# Patient Record
Sex: Female | Born: 1940 | Race: White | Hispanic: No | Marital: Married | Smoking: Never smoker
Health system: Southern US, Community
[De-identification: ages and names within clinical notes are randomized; demographics above are authoritative.]

## PROBLEM LIST (undated history)

## (undated) DIAGNOSIS — K8689 Other specified diseases of pancreas: Secondary | ICD-10-CM

## (undated) DIAGNOSIS — E119 Type 2 diabetes mellitus without complications: Secondary | ICD-10-CM

## (undated) DIAGNOSIS — J302 Other seasonal allergic rhinitis: Secondary | ICD-10-CM

## (undated) DIAGNOSIS — K859 Acute pancreatitis without necrosis or infection, unspecified: Secondary | ICD-10-CM

## (undated) DIAGNOSIS — I499 Cardiac arrhythmia, unspecified: Secondary | ICD-10-CM

## (undated) DIAGNOSIS — E7800 Pure hypercholesterolemia, unspecified: Secondary | ICD-10-CM

## (undated) DIAGNOSIS — K219 Gastro-esophageal reflux disease without esophagitis: Secondary | ICD-10-CM

## (undated) DIAGNOSIS — I10 Essential (primary) hypertension: Secondary | ICD-10-CM

## (undated) DIAGNOSIS — C259 Malignant neoplasm of pancreas, unspecified: Secondary | ICD-10-CM

## (undated) HISTORY — PX: ABDOMINAL HYSTERECTOMY: SHX81

## (undated) HISTORY — PX: CHOLECYSTECTOMY: SHX55

## (undated) HISTORY — PX: NASAL SINUS SURGERY: SHX719

---

## 2012-06-29 DIAGNOSIS — I4891 Unspecified atrial fibrillation: Secondary | ICD-10-CM | POA: Insufficient documentation

## 2019-11-17 ENCOUNTER — Encounter: Payer: Self-pay | Admitting: Emergency Medicine

## 2019-11-17 ENCOUNTER — Other Ambulatory Visit: Payer: Self-pay

## 2019-11-17 ENCOUNTER — Encounter (HOSPITAL_COMMUNITY): Payer: Self-pay | Admitting: Emergency Medicine

## 2019-11-17 ENCOUNTER — Other Ambulatory Visit: Payer: Self-pay | Admitting: Physician Assistant

## 2019-11-17 ENCOUNTER — Inpatient Hospital Stay (HOSPITAL_COMMUNITY)
Admission: EM | Admit: 2019-11-17 | Discharge: 2019-11-28 | DRG: 438 | Disposition: A | Discharge status: Home / Self Care | Payer: Medicare Other | Source: Ambulatory Visit | Attending: Internal Medicine | Admitting: Internal Medicine

## 2019-11-17 ENCOUNTER — Emergency Department (HOSPITAL_COMMUNITY): Payer: Medicare Other

## 2019-11-17 ENCOUNTER — Ambulatory Visit
Admission: EM | Admit: 2019-11-17 | Discharge: 2019-11-17 | Disposition: A | Discharge status: Other Acute Inpatient Hospital | Payer: Medicare Other

## 2019-11-17 DIAGNOSIS — K644 Residual hemorrhoidal skin tags: Secondary | ICD-10-CM | POA: Diagnosis present

## 2019-11-17 DIAGNOSIS — E871 Hypo-osmolality and hyponatremia: Secondary | ICD-10-CM | POA: Diagnosis present

## 2019-11-17 DIAGNOSIS — E8809 Other disorders of plasma-protein metabolism, not elsewhere classified: Secondary | ICD-10-CM | POA: Diagnosis present

## 2019-11-17 DIAGNOSIS — R110 Nausea: Secondary | ICD-10-CM | POA: Diagnosis not present

## 2019-11-17 DIAGNOSIS — K8689 Other specified diseases of pancreas: Secondary | ICD-10-CM | POA: Diagnosis present

## 2019-11-17 DIAGNOSIS — K5900 Constipation, unspecified: Secondary | ICD-10-CM | POA: Diagnosis present

## 2019-11-17 DIAGNOSIS — Z20822 Contact with and (suspected) exposure to covid-19: Secondary | ICD-10-CM | POA: Diagnosis present

## 2019-11-17 DIAGNOSIS — K859 Acute pancreatitis without necrosis or infection, unspecified: Secondary | ICD-10-CM | POA: Diagnosis present

## 2019-11-17 DIAGNOSIS — D649 Anemia, unspecified: Secondary | ICD-10-CM | POA: Diagnosis present

## 2019-11-17 DIAGNOSIS — E785 Hyperlipidemia, unspecified: Secondary | ICD-10-CM | POA: Diagnosis present

## 2019-11-17 DIAGNOSIS — K2950 Unspecified chronic gastritis without bleeding: Secondary | ICD-10-CM | POA: Diagnosis present

## 2019-11-17 DIAGNOSIS — K922 Gastrointestinal hemorrhage, unspecified: Secondary | ICD-10-CM | POA: Diagnosis not present

## 2019-11-17 DIAGNOSIS — Z79899 Other long term (current) drug therapy: Secondary | ICD-10-CM

## 2019-11-17 DIAGNOSIS — K5520 Angiodysplasia of colon without hemorrhage: Secondary | ICD-10-CM | POA: Diagnosis present

## 2019-11-17 DIAGNOSIS — K862 Cyst of pancreas: Secondary | ICD-10-CM | POA: Diagnosis not present

## 2019-11-17 DIAGNOSIS — R1084 Generalized abdominal pain: Secondary | ICD-10-CM

## 2019-11-17 DIAGNOSIS — Z881 Allergy status to other antibiotic agents status: Secondary | ICD-10-CM | POA: Diagnosis not present

## 2019-11-17 DIAGNOSIS — Z9049 Acquired absence of other specified parts of digestive tract: Secondary | ICD-10-CM | POA: Diagnosis not present

## 2019-11-17 DIAGNOSIS — R112 Nausea with vomiting, unspecified: Secondary | ICD-10-CM

## 2019-11-17 DIAGNOSIS — R81 Glycosuria: Secondary | ICD-10-CM

## 2019-11-17 DIAGNOSIS — D126 Benign neoplasm of colon, unspecified: Secondary | ICD-10-CM | POA: Diagnosis present

## 2019-11-17 DIAGNOSIS — R1010 Upper abdominal pain, unspecified: Secondary | ICD-10-CM | POA: Diagnosis not present

## 2019-11-17 DIAGNOSIS — E1165 Type 2 diabetes mellitus with hyperglycemia: Secondary | ICD-10-CM

## 2019-11-17 DIAGNOSIS — M545 Low back pain, unspecified: Secondary | ICD-10-CM

## 2019-11-17 DIAGNOSIS — K635 Polyp of colon: Secondary | ICD-10-CM | POA: Diagnosis not present

## 2019-11-17 DIAGNOSIS — Z1211 Encounter for screening for malignant neoplasm of colon: Secondary | ICD-10-CM | POA: Diagnosis not present

## 2019-11-17 DIAGNOSIS — K219 Gastro-esophageal reflux disease without esophagitis: Secondary | ICD-10-CM | POA: Diagnosis present

## 2019-11-17 DIAGNOSIS — R933 Abnormal findings on diagnostic imaging of other parts of digestive tract: Secondary | ICD-10-CM | POA: Diagnosis not present

## 2019-11-17 DIAGNOSIS — K863 Pseudocyst of pancreas: Secondary | ICD-10-CM | POA: Diagnosis present

## 2019-11-17 DIAGNOSIS — I10 Essential (primary) hypertension: Secondary | ICD-10-CM | POA: Diagnosis present

## 2019-11-17 DIAGNOSIS — E1010 Type 1 diabetes mellitus with ketoacidosis without coma: Secondary | ICD-10-CM

## 2019-11-17 DIAGNOSIS — E1110 Type 2 diabetes mellitus with ketoacidosis without coma: Secondary | ICD-10-CM | POA: Diagnosis present

## 2019-11-17 DIAGNOSIS — K317 Polyp of stomach and duodenum: Secondary | ICD-10-CM | POA: Diagnosis present

## 2019-11-17 DIAGNOSIS — K641 Second degree hemorrhoids: Secondary | ICD-10-CM | POA: Diagnosis present

## 2019-11-17 DIAGNOSIS — R824 Acetonuria: Secondary | ICD-10-CM

## 2019-11-17 DIAGNOSIS — K8500 Idiopathic acute pancreatitis without necrosis or infection: Secondary | ICD-10-CM | POA: Diagnosis not present

## 2019-11-17 DIAGNOSIS — K5730 Diverticulosis of large intestine without perforation or abscess without bleeding: Secondary | ICD-10-CM | POA: Diagnosis present

## 2019-11-17 DIAGNOSIS — E0810 Diabetes mellitus due to underlying condition with ketoacidosis without coma: Secondary | ICD-10-CM | POA: Diagnosis not present

## 2019-11-17 DIAGNOSIS — E876 Hypokalemia: Secondary | ICD-10-CM | POA: Diagnosis present

## 2019-11-17 DIAGNOSIS — Z880 Allergy status to penicillin: Secondary | ICD-10-CM

## 2019-11-17 DIAGNOSIS — Z90710 Acquired absence of both cervix and uterus: Secondary | ICD-10-CM | POA: Diagnosis not present

## 2019-11-17 HISTORY — DX: Pure hypercholesterolemia, unspecified: E78.00

## 2019-11-17 HISTORY — DX: Essential (primary) hypertension: I10

## 2019-11-17 HISTORY — DX: Type 2 diabetes mellitus without complications: E11.9

## 2019-11-17 HISTORY — DX: Gastro-esophageal reflux disease without esophagitis: K21.9

## 2019-11-17 LAB — URINALYSIS, ROUTINE W REFLEX MICROSCOPIC
Bacteria, UA: NONE SEEN
Bilirubin Urine: NEGATIVE
Glucose, UA: >=500 mg/dL — AB
Hgb urine dipstick: NEGATIVE
Ketones, ur: 80 mg/dL — AB
Leukocytes,Ua: NEGATIVE
Nitrite: NEGATIVE
Protein, ur: 30 mg/dL — AB
Specific Gravity, Urine: 1.024 (ref 1.005–1.030)
pH: 5 (ref 5.0–8.0)

## 2019-11-17 LAB — CBC
HCT: 39.1 % (ref 36.0–46.0)
Hemoglobin: 12.3 g/dL (ref 12.0–15.0)
MCH: 28.5 pg (ref 26.0–34.0)
MCHC: 31.5 g/dL (ref 30.0–36.0)
MCV: 90.5 fL (ref 80.0–100.0)
Platelets: 287 10*3/uL (ref 150–400)
RBC: 4.32 MIL/uL (ref 3.87–5.11)
RDW: 13.5 % (ref 11.5–15.5)
WBC: 15.2 10*3/uL — ABNORMAL HIGH (ref 4.0–10.5)
nRBC: 0 % (ref 0.0–0.2)

## 2019-11-17 LAB — COMPREHENSIVE METABOLIC PANEL
ALT: 16 U/L (ref 0–44)
AST: 9 U/L — ABNORMAL LOW (ref 15–41)
Albumin: 2.9 g/dL — ABNORMAL LOW (ref 3.5–5.0)
Alkaline Phosphatase: 111 U/L (ref 38–126)
Anion gap: 17 — ABNORMAL HIGH (ref 5–15)
BUN: 14 mg/dL (ref 8–23)
CO2: 11 mmol/L — ABNORMAL LOW (ref 22–32)
Calcium: 8.9 mg/dL (ref 8.9–10.3)
Chloride: 106 mmol/L (ref 98–111)
Creatinine, Ser: 0.75 mg/dL (ref 0.44–1.00)
GFR, Estimated: >60 mL/min (ref >60)
Glucose, Bld: 291 mg/dL — ABNORMAL HIGH (ref 70–99)
Potassium: 3.7 mmol/L (ref 3.5–5.1)
Sodium: 134 mmol/L — ABNORMAL LOW (ref 135–145)
Total Bilirubin: 0.6 mg/dL (ref 0.3–1.2)
Total Protein: 7 g/dL (ref 6.5–8.1)

## 2019-11-17 LAB — BASIC METABOLIC PANEL
Anion gap: 15 (ref 5–15)
Anion gap: 12 (ref 5–15)
BUN: 11 mg/dL (ref 8–23)
BUN: 10 mg/dL (ref 8–23)
CO2: 11 mmol/L — ABNORMAL LOW (ref 22–32)
CO2: 13 mmol/L — ABNORMAL LOW (ref 22–32)
Calcium: 8.1 mg/dL — ABNORMAL LOW (ref 8.9–10.3)
Calcium: 8.2 mg/dL — ABNORMAL LOW (ref 8.9–10.3)
Chloride: 111 mmol/L (ref 98–111)
Chloride: 110 mmol/L (ref 98–111)
Creatinine, Ser: 0.6 mg/dL (ref 0.44–1.00)
Creatinine, Ser: 0.59 mg/dL (ref 0.44–1.00)
GFR, Estimated: >60 mL/min (ref >60)
GFR, Estimated: >60 mL/min (ref >60)
Glucose, Bld: 206 mg/dL — ABNORMAL HIGH (ref 70–99)
Glucose, Bld: 188 mg/dL — ABNORMAL HIGH (ref 70–99)
Potassium: 3.7 mmol/L (ref 3.5–5.1)
Potassium: 3.6 mmol/L (ref 3.5–5.1)
Sodium: 137 mmol/L (ref 135–145)
Sodium: 135 mmol/L (ref 135–145)

## 2019-11-17 LAB — RESPIRATORY PANEL BY RT PCR (FLU A&B, COVID)
Influenza A by PCR: NEGATIVE
Influenza B by PCR: NEGATIVE
SARS Coronavirus 2 by RT PCR: NEGATIVE

## 2019-11-17 LAB — I-STAT VENOUS BLOOD GAS, ED
Acid-base deficit: 16 mmol/L — ABNORMAL HIGH (ref 0.0–2.0)
Bicarbonate: 10.8 mmol/L — ABNORMAL LOW (ref 20.0–28.0)
Calcium, Ion: 1.24 mmol/L (ref 1.15–1.40)
HCT: 40 % (ref 36.0–46.0)
Hemoglobin: 13.6 g/dL (ref 12.0–15.0)
O2 Saturation: 62 %
Potassium: 3.8 mmol/L (ref 3.5–5.1)
Sodium: 136 mmol/L (ref 135–145)
TCO2: 12 mmol/L — ABNORMAL LOW (ref 22–32)
pCO2, Ven: 27.4 mmHg — ABNORMAL LOW (ref 44.0–60.0)
pH, Ven: 7.203 — ABNORMAL LOW (ref 7.250–7.430)
pO2, Ven: 38 mmHg (ref 32.0–45.0)

## 2019-11-17 LAB — POCT URINALYSIS DIP (MANUAL ENTRY)
Glucose, UA: 500 mg/dL — AB
Leukocytes, UA: NEGATIVE
Nitrite, UA: NEGATIVE
Protein Ur, POC: 30 mg/dL — AB
Spec Grav, UA: >=1.03 — AB (ref 1.010–1.025)
Urobilinogen, UA: 0.2 E.U./dL
pH, UA: 5.5 (ref 5.0–8.0)

## 2019-11-17 LAB — HEMOGLOBIN A1C
Hgb A1c MFr Bld: 11 % — ABNORMAL HIGH (ref 4.8–5.6)
Mean Plasma Glucose: 269 mg/dL

## 2019-11-17 LAB — BETA-HYDROXYBUTYRIC ACID
Beta-Hydroxybutyric Acid: 5.38 mmol/L — ABNORMAL HIGH (ref 0.05–0.27)
Beta-Hydroxybutyric Acid: 2.61 mmol/L — ABNORMAL HIGH (ref 0.05–0.27)

## 2019-11-17 LAB — POCT FASTING CBG KUC MANUAL ENTRY: POCT Glucose (KUC): 271 mg/dL — AB (ref 70–99)

## 2019-11-17 LAB — LIPASE, BLOOD: Lipase: 34 U/L (ref 11–51)

## 2019-11-17 LAB — TSH: TSH: 0.539 u[IU]/mL (ref 0.350–4.500)

## 2019-11-17 LAB — CBG MONITORING, ED: Glucose-Capillary: 145 mg/dL — ABNORMAL HIGH (ref 70–99)

## 2019-11-17 IMAGING — CT CT ABD-PELV W/ CM
2 of 5 series · 15 of 46 positions shown, 17 images · IV contrast (APPLIED)
Comparison: None.

CLINICAL DATA: Patient with generalized abdominal pain and nausea.

EXAM:
CT ABDOMEN AND PELVIS WITH CONTRAST
TECHNIQUE: Multidetector CT imaging of the abdomen and pelvis was performed
using the standard protocol following bolus administration of
intravenous contrast.
CONTRAST:  100mL OMNIPAQUE IOHEXOL 300 MG/ML  SOLN

[Series 3: abd/ pelvis 5.0 i30f 2 · axial · 0.71mm/px · z∈[-406,-6]mm · 12 of 92 slices shown, 14 images]
[im 6/92  soft-tissue]
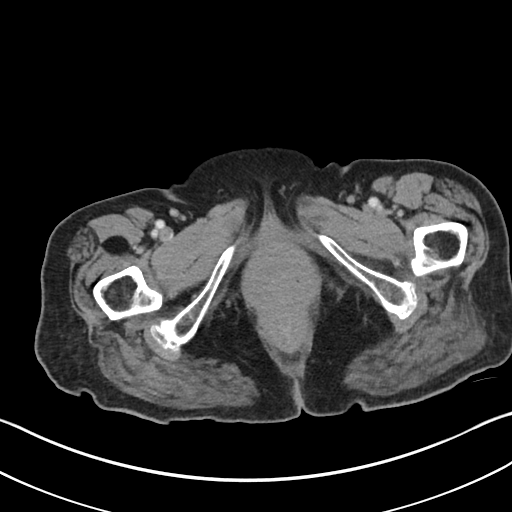
[im 6/92  bone]
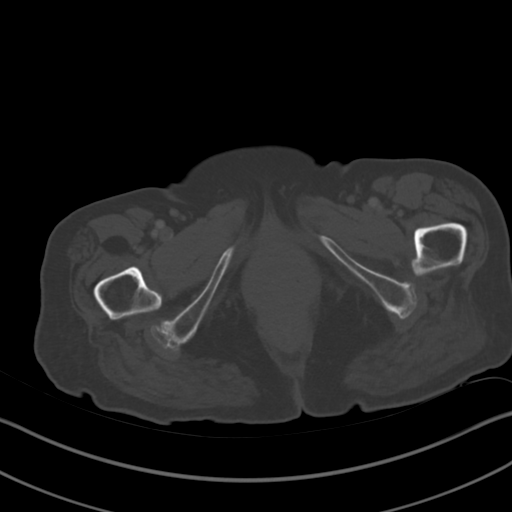
[im 16/92  soft-tissue]
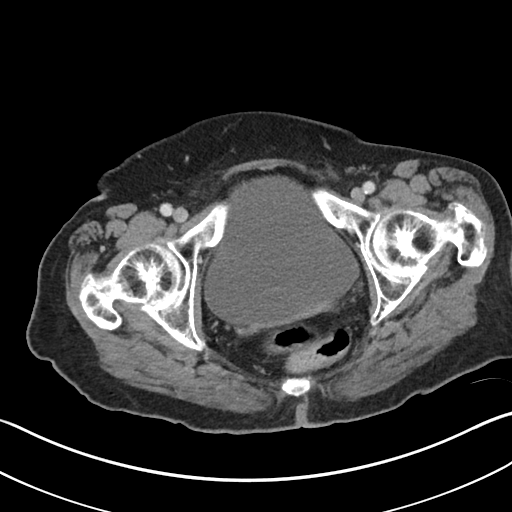
[im 21/92  soft-tissue]
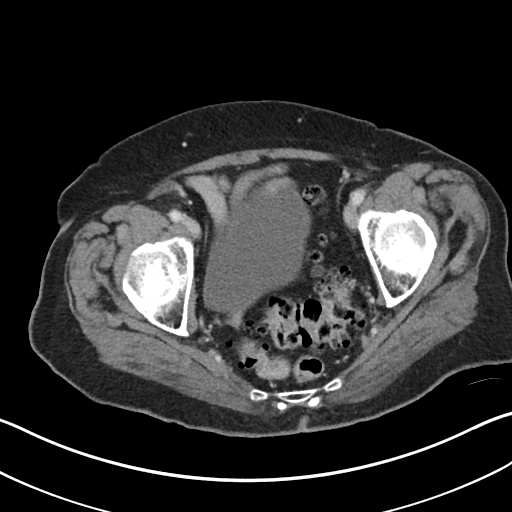
[im 26/92  soft-tissue]
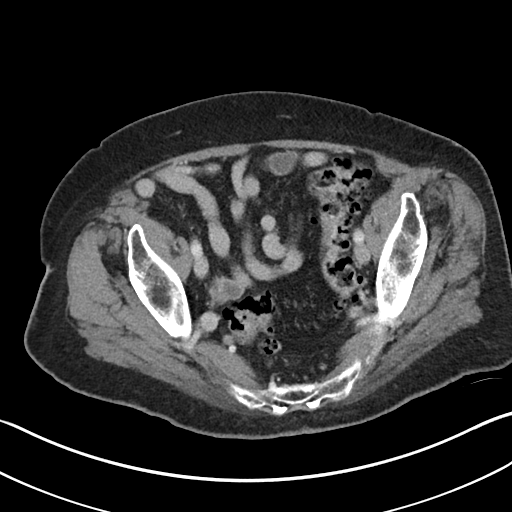
[im 36/92  soft-tissue]
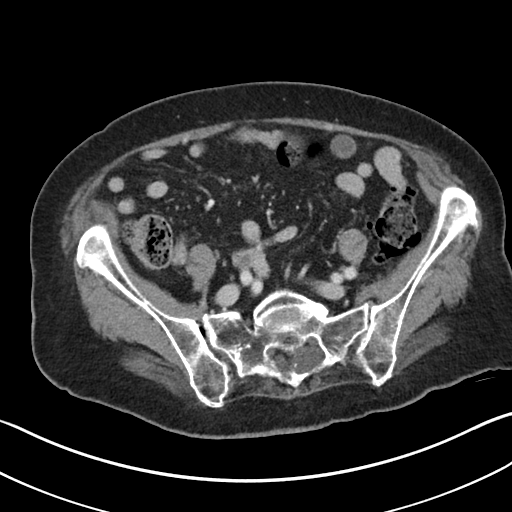
[im 41/92  soft-tissue]
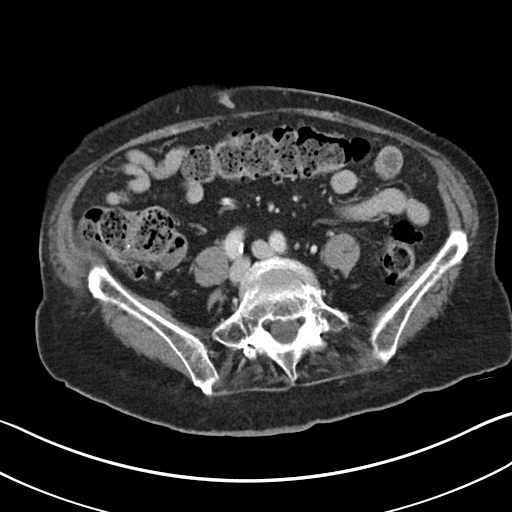
[im 51/92  soft-tissue]
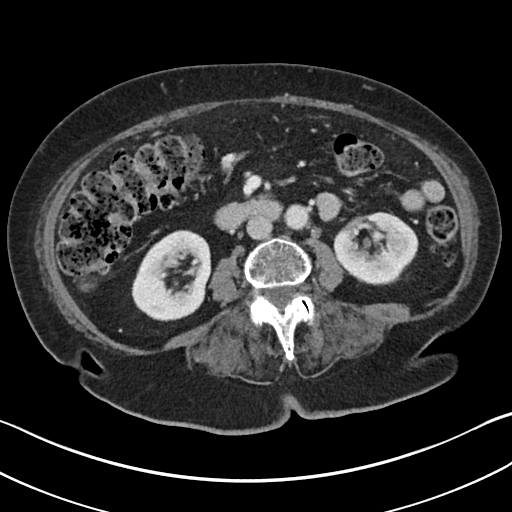
[im 56/92  soft-tissue]
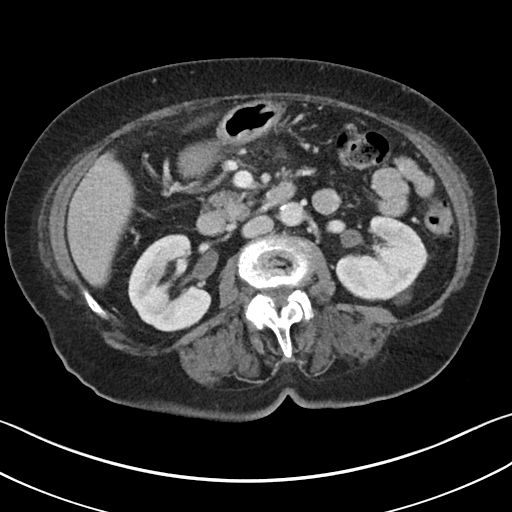
[im 66/92  soft-tissue]
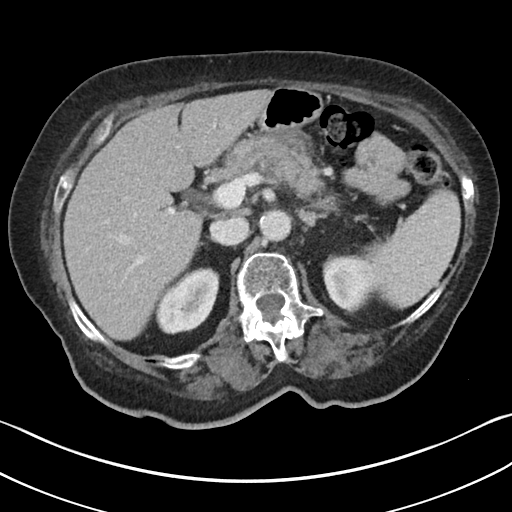
[im 66/92  bone]
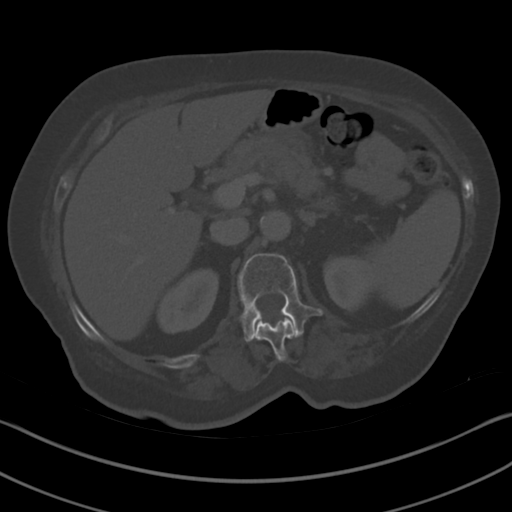
[im 71/92  soft-tissue]
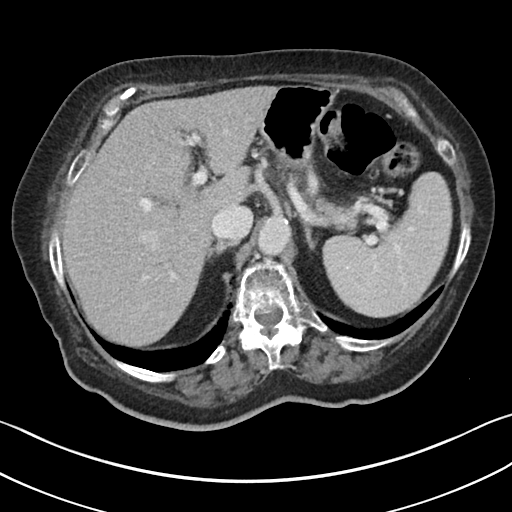
[im 76/92  soft-tissue]
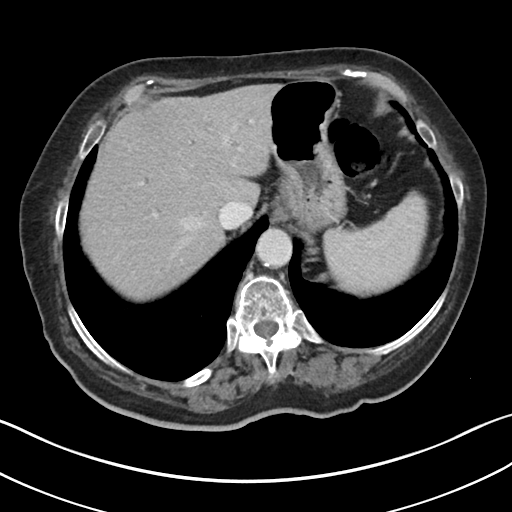
[im 86/92  soft-tissue]
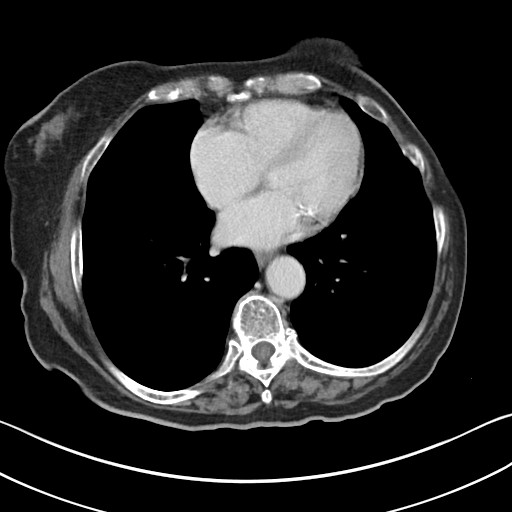

[Series 6: coronal soft tissue · coronal · 0.73mm/px · 3 of 101 slices shown]
[im 34/101  soft-tissue]
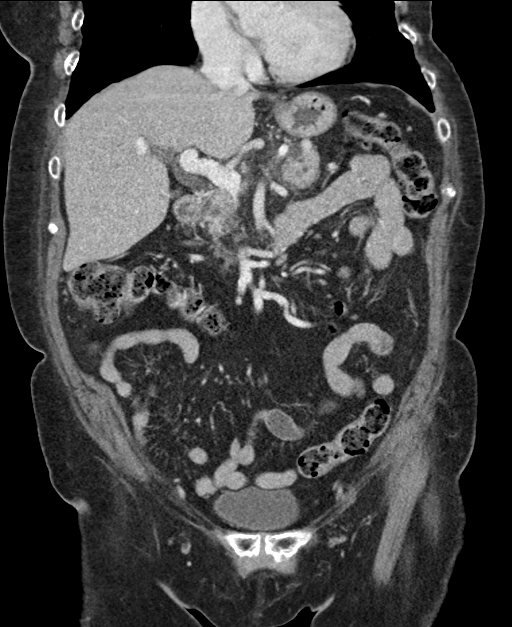
[im 45/101  soft-tissue]
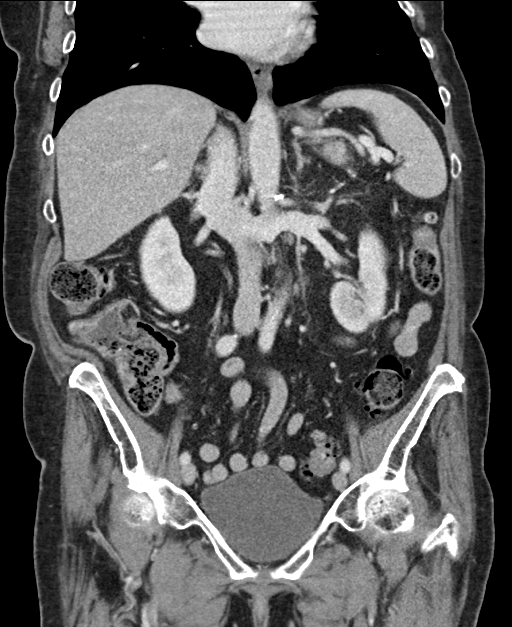
[im 56/101  soft-tissue]
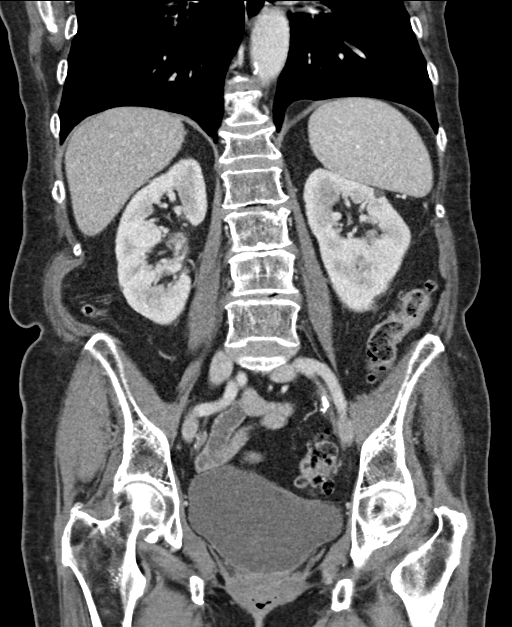

[15 of 46 positions shown; findings below may reference images not displayed]

FINDINGS: Lower chest: Normal heart size. Lung bases are clear. No pleural
effusion.

Hepatobiliary: Liver is normal in size and contour. Prior
cholecystectomy. No intrahepatic or extrahepatic biliary ductal
dilatation.

Pancreas: There is suggestion of inflammation involving the mid body
of the pancreas with peripancreatic fat stranding. Additionally,
within the mid body of the pancreas there is a possible 2.5 x 2.2 cm
low-attenuation mass. This appears to abut the celiac axis as well
as the splenic vein. The splenic vein is narrowed (image 25; series
3). There is a small amount of fat stranding about the pancreas.

Spleen: Unremarkable

Adrenals/Urinary Tract: Normal adrenal glands. Kidneys enhance
symmetrically with contrast. Bilateral subcentimeter too small to
characterize low-attenuation renal lesions. Partially exophytic cyst
mid pole left kidney. No hydronephrosis. Urinary bladder is
unremarkable.

Stomach/Bowel: Descending and sigmoid colonic diverticulosis. No CT
evidence for acute diverticulitis. There is focal narrowing of the
ascending colon (image 46; series 3). Normal morphology of the
stomach. No evidence for small bowel obstruction. No free fluid or
free intraperitoneal air.

Vascular/Lymphatic: Normal caliber abdominal aorta. Peripheral
calcified atherosclerotic plaque. Retroaortic left renal vein. No
retroperitoneal adenopathy.

Reproductive: Prior hysterectomy.

Other: None.

Musculoskeletal: Lower thoracic and lumbar spine degenerative
changes. Age-indeterminate compression deformity of the superior L1
endplate and superior L3 endplate. Addition there is
age-indeterminate height loss of the T11 vertebral body.
IMPRESSION: 1. There is inflammation involving the mid body the pancreas which
may be secondary to pancreatitis. Additionally, findings are
concerning for the possibility of a mass within the mid body of the
pancreas. Given the overall findings, recommend dedicated evaluation
of the pancreas with pre and post contrast-enhanced abdominal MRI.
2. There is focal narrowing of the ascending colon. While this may
be secondary to peristalsis, recommend correlation with the
patient's colon cancer screening history. If screening is not
up-to-date, appropriate screening should be considered.
3. Age-indeterminate compression deformities of the superior L1 and
L3 vertebral bodies. Recommend correlation with point tenderness.
4. Sigmoid colonic diverticulosis without CT evidence for acute
diverticulitis.

## 2019-11-17 MED ORDER — ENOXAPARIN SODIUM 40 MG/0.4ML ~~LOC~~ SOLN: 40.0000 mg | SUBCUTANEOUS | Status: DC

## 2019-11-17 MED ORDER — IRBESARTAN 150 MG PO TABS
75.0000 mg | ORAL_TABLET | Freq: Every day | ORAL | Status: DC
  Administered 2019-11-18 – 2019-11-28 (×9): 75 mg via ORAL
  Filled 2019-11-17 (×10): qty 1

## 2019-11-17 MED ORDER — INSULIN REGULAR(HUMAN) IN NACL 100-0.9 UT/100ML-% IV SOLN
INTRAVENOUS | Status: DC
  Administered 2019-11-17: 8.5 [IU]/h via INTRAVENOUS
  Filled 2019-11-17 (×2): qty 100

## 2019-11-17 MED ORDER — DEXTROSE IN LACTATED RINGERS 5 % IV SOLN: INTRAVENOUS | Status: DC

## 2019-11-17 MED ORDER — DILTIAZEM HCL ER COATED BEADS 120 MG PO CP24
120.0000 mg | ORAL_CAPSULE | Freq: Every day | ORAL | Status: DC
  Administered 2019-11-18 – 2019-11-28 (×10): 120 mg via ORAL
  Filled 2019-11-17 (×10): qty 1

## 2019-11-17 MED ORDER — LACTATED RINGERS IV SOLN: INTRAVENOUS | Status: DC

## 2019-11-17 MED ORDER — LACTATED RINGERS IV BOLUS
20.0000 mL/kg | Freq: Once | INTRAVENOUS | Status: AC
  Administered 2019-11-17: 1306 mL via INTRAVENOUS

## 2019-11-17 MED ORDER — HYDROMORPHONE HCL 1 MG/ML IJ SOLN
0.5000 mg | Freq: Four times a day (QID) | INTRAMUSCULAR | Status: DC | PRN
  Administered 2019-11-17 – 2019-11-23 (×6): 0.5 mg via INTRAVENOUS
  Filled 2019-11-17: qty 0.5
  Filled 2019-11-17: qty 1
  Filled 2019-11-17: qty 0.5
  Filled 2019-11-17 (×2): qty 1
  Filled 2019-11-17 (×2): qty 0.5

## 2019-11-17 MED ORDER — SODIUM CHLORIDE 0.9 % IV BOLUS
1000.0000 mL | Freq: Once | INTRAVENOUS | Status: AC
  Administered 2019-11-17: 1000 mL via INTRAVENOUS

## 2019-11-17 MED ORDER — DEXTROSE 50 % IV SOLN: 0.0000 mL | INTRAVENOUS | Status: DC | PRN

## 2019-11-17 MED ORDER — IOHEXOL 300 MG/ML  SOLN
100.0000 mL | Freq: Once | INTRAMUSCULAR | Status: AC | PRN
  Administered 2019-11-17: 100 mL via INTRAVENOUS

## 2019-11-17 MED ORDER — MORPHINE SULFATE (PF) 4 MG/ML IV SOLN
4.0000 mg | Freq: Once | INTRAVENOUS | Status: AC
  Administered 2019-11-17: 4 mg via INTRAVENOUS
  Filled 2019-11-17: qty 1

## 2019-11-17 MED ORDER — POTASSIUM CHLORIDE 10 MEQ/100ML IV SOLN
10.0000 meq | INTRAVENOUS | Status: AC
  Administered 2019-11-17 (×2): 10 meq via INTRAVENOUS
  Filled 2019-11-17: qty 100

## 2019-11-17 NOTE — ED Notes (Signed)
To ct

## 2019-11-17 NOTE — ED Triage Notes (Signed)
Pt sent from [address] [facility] at [facility].  Reports lower back pain, generalized abd pain, and nausea x 1 1/2 days.  States she was told it may be related to her uncontrolled diabetes.

## 2019-11-17 NOTE — ED Triage Notes (Signed)
Pt here for lower back pain and nausea with some abd pain starting yesterday; pt has been seen by PCP for indigestion recently after getting her flu vaccine

## 2019-11-17 NOTE — H&P (Signed)
History and Physical   Nancy Hawkins EXB:[id number] DOB: 27-Aug-1940 DOA: 11/17/2019  PCP: Lilian Coma., MD   Patient coming from: Home via Urgent Care  Chief Complaint: Abdominal Pain and Back pain  HPI: Nancy Hawkins is a 79 y.o. female with medical history significant of HTN and HLD who presents for worsening intermittent abdominal and back pain. She states she has had intermittent abdominal pain and low back pain for about 1 months. She has seen her primary care doctor a couple of times since the pain began. At one point she was diagnosed with diabetes and prescribed Metformin (which she did not tolerate and stopped taking), she was thought to have GERD and given a trial of PPI, which has not helped her symptoms. The pain has remained intermittent, but has progressively worsened to a 10/10 last night, prompting her to visit urgent care leading to a subsequent transfer to the ED. she does not report specific aggravating aggravating or alleviating factors.  Pain at times feels like it is radiating around both sides to her back. she has intermittent associated nausea without vomiting but does not necessarily correlate with her intermittent pain.  She also reports a degree of constipation for the last several days.  She denies vomiting, diarrhea, chest pain, shortness of breath.  She has never had a colonoscopy.  ED Course: Lab work significant for glucose of 300, bicarb 11 with gap of 17.  Ketones present in the urine and beta hydroxybutyrate level of elevated at 5.38.  Lipase normal at 34.  VBG showed pH 7.2 and PCO2 of 27 consistent with metabolic acidosis with respiratory compensation.  CT abdomen pelvis showed inflammation of the mid body the pancreas with possibility of mass in the mid body of the pancreas with recommendation for dedicated pre and post contrast-enhanced MRI.  Also noted on CT were focal narrowing of the ascending colon and age-indeterminate compression of superior L1 and L3 vertebral  bodies.  Review of Systems: As per HPI otherwise all other systems reviewed and are negative.  Past Medical History:  Diagnosis Date  . Diabetes mellitus without complication ([place])   . GERD (gastroesophageal reflux disease)   . Hypercholesteremia   . Hypertension     Past Surgical History:  Procedure Laterality Date  . ABDOMINAL HYSTERECTOMY    . CHOLECYSTECTOMY    . NASAL SINUS SURGERY      Social History  reports that she has never smoked. She has never used smokeless tobacco. She reports previous alcohol use. She reports that she does not use drugs.  Allergies  Allergen Reactions  . Cephalosporins Shortness Of Breath  . Erythromycin Shortness Of Breath  . Penicillins Anaphylaxis    Family History  Family history unknown: Yes  Reviewed on admission  Prior to Admission medications   Medication Sig Start Date End Date Taking? Authorizing Provider  diltiazem (TIAZAC) 120 MG 24 hr capsule Take 120 mg by mouth daily.   Yes [provider]  telmisartan (MICARDIS) 40 MG tablet Take 40 mg by mouth daily.   Yes [provider]    Physical Exam: Vitals:   11/17/19 1630 11/17/19 1730 11/17/19 1745 11/17/19 1816  BP: (!) 132/56 (!) 141/58  (!) 148/72  Pulse: 78 84 89 87  Resp:  20 20 20   Temp:      TempSrc:      SpO2: 99% 99% 99% 99%  Weight:        Constitutional: NAD, calm, comfortable Vitals:  11/17/19 1630 11/17/19 1730 11/17/19 1745 11/17/19 1816  BP: (!) 132/56 (!) 141/58  (!) 148/72  Pulse: 78 84 89 87  Resp:  20 20 20   Temp:      TempSrc:      SpO2: 99% 99% 99% 99%  Weight:       Physical Exam Constitutional:      General: She is not in acute distress.    Appearance: Normal appearance.     Comments: Intermittent shivering  HENT:     Head: Normocephalic and atraumatic.     Mouth/Throat:     Mouth: Mucous membranes are moist.     Pharynx: Oropharynx is clear.  Eyes:     Extraocular Movements: Extraocular movements intact.      Pupils: Pupils are equal, round, and reactive to light.  Cardiovascular:     Rate and Rhythm: Normal rate and regular rhythm.     Pulses: Normal pulses.     Heart sounds: Normal heart sounds.  Pulmonary:     Effort: Pulmonary effort is normal. No respiratory distress.     Breath sounds: Normal breath sounds.  Abdominal:     General: Abdomen is flat. There is no distension.     Palpations: Abdomen is soft.     Tenderness: There is abdominal tenderness.     Comments: Tender to palpation at epigastum  Musculoskeletal:        General: No swelling or deformity.  Skin:    General: Skin is warm and dry.  Neurological:     General: No focal deficit present.     Mental Status: Mental status is at baseline.    Labs on Admission: I have personally reviewed following labs and imaging studies  CBC: Recent Labs  Lab 11/17/19 1013 11/17/19 1523  WBC 15.2*  --   HGB 12.3 13.6  HCT 39.1 40.0  MCV 90.5  --   PLT 287  --     Basic Metabolic Panel: Recent Labs  Lab 11/17/19 1013 11/17/19 1523 11/17/19 1810 11/17/19 1918  NA 134* 136 137 135  K 3.7 3.8 3.7 3.6  CL 106  --  111 110  CO2 11*  --  11* 13*  GLUCOSE 291*  --  206* 188*  BUN 14  --  11 10  CREATININE 0.75  --  0.60 0.59  CALCIUM 8.9  --  8.1* 8.2*    GFR: CrCl cannot be calculated (Unknown ideal weight.).  Liver Function Tests: Recent Labs  Lab 11/17/19 1013  AST 9*  ALT 16  ALKPHOS 111  BILITOT 0.6  PROT 7.0  ALBUMIN 2.9*    Urine analysis:    Component Value Date/Time   COLORURINE YELLOW 11/17/2019 1337   APPEARANCEUR CLEAR 11/17/2019 1337   LABSPEC 1.024 11/17/2019 1337   PHURINE 5.0 11/17/2019 1337   GLUCOSEU >=500 (A) 11/17/2019 1337   HGBUR NEGATIVE 11/17/2019 1337   BILIRUBINUR NEGATIVE 11/17/2019 1337   BILIRUBINUR small (A) 11/17/2019 0906   KETONESUR 80 (A) 11/17/2019 1337   PROTEINUR 30 (A) 11/17/2019 1337   UROBILINOGEN 0.2 11/17/2019 0906   NITRITE NEGATIVE 11/17/2019 1337    LEUKOCYTESUR NEGATIVE 11/17/2019 1337    Radiological Exams on Admission: CT ABDOMEN PELVIS W CONTRAST  Result Date: 11/17/2019 CLINICAL DATA:  Patient with generalized abdominal pain and nausea. EXAM: CT ABDOMEN AND PELVIS WITH CONTRAST TECHNIQUE: Multidetector CT imaging of the abdomen and pelvis was performed using the standard protocol following bolus administration of intravenous contrast. CONTRAST:  126mL  OMNIPAQUE IOHEXOL 300 MG/ML  SOLN COMPARISON:  None. FINDINGS: Lower chest: Normal heart size. Lung bases are clear. No pleural effusion. Hepatobiliary: Liver is normal in size and contour. Prior cholecystectomy. No intrahepatic or extrahepatic biliary ductal dilatation. Pancreas: There is suggestion of inflammation involving the mid body of the pancreas with peripancreatic fat stranding. Additionally, within the mid body of the pancreas there is a possible 2.5 x 2.2 cm low-attenuation mass. This appears to abut the celiac axis as well as the splenic vein. The splenic vein is narrowed (image 25; series 3). There is a small amount of fat stranding about the pancreas. Spleen: Unremarkable Adrenals/Urinary Tract: Normal adrenal glands. Kidneys enhance symmetrically with contrast. Bilateral subcentimeter too small to characterize low-attenuation renal lesions. Partially exophytic cyst mid pole left kidney. No hydronephrosis. Urinary bladder is unremarkable. Stomach/Bowel: Descending and sigmoid colonic diverticulosis. No CT evidence for acute diverticulitis. There is focal narrowing of the ascending colon (image 46; series 3). Normal morphology of the stomach. No evidence for small bowel obstruction. No free fluid or free intraperitoneal air. Vascular/Lymphatic: Normal caliber abdominal aorta. Peripheral calcified atherosclerotic plaque. Retroaortic left renal vein. No retroperitoneal adenopathy. Reproductive: Prior hysterectomy. Other: None. Musculoskeletal: Lower thoracic and lumbar spine degenerative  changes. Age-indeterminate compression deformity of the superior L1 endplate and superior L3 endplate. Addition there is age-indeterminate height loss of the T11 vertebral body. IMPRESSION: 1. There is inflammation involving the mid body the pancreas which may be secondary to pancreatitis. Additionally, findings are concerning for the possibility of a mass within the mid body of the pancreas. Given the overall findings, recommend dedicated evaluation of the pancreas with pre and post contrast-enhanced abdominal MRI. 2. There is focal narrowing of the ascending colon. While this may be secondary to peristalsis, recommend correlation with the patient's colon cancer screening history. If screening is not up-to-date, appropriate screening should be considered. 3. Age-indeterminate compression deformities of the superior L1 and L3 vertebral bodies. Recommend correlation with point tenderness. 4. Sigmoid colonic diverticulosis without CT evidence for acute diverticulitis. Electronically Signed   By: Lovey Newcomer M.D.   On: 11/17/2019 17:42   EKG:  Pending.   Assessment/Plan Active Problems:   DKA (diabetic ketoacidosis) ([place])   Pancreatic mass  DKA - Admit to progressive - On insulin drip - S/P 20 mEq IV KCl in ED, follow K level - LR at 125 mL/hr until CBG less than 250 - Switch to D5-LR when 1 CBG less than 250 - Nothing by mouth  - BMP q4h - CBG Q1H - Once anion gap closed 2, administer Lantus 15 units - Continue insulin drip for 1-2 more hours, then discontinue and start SSI-S  - Remain NPO due to possibility of EUS tomorrow pending schedule  Pancreatic inflammation Pancreatic Mass - Discussed with GI, who will see tomorrow - Mass and inflammation seen on Abdominal CT - MRI with and without contrast - Will continue as needed IV pain medication - Lipase normal - Check CA 19-9 level - Hold any anticoagulation - Remain n.p.o. at midnight  HTN - Resume home ARB and Diltiazem in the  AM  Focal narrowing of ascending colon - Radiologist read notes this may be due to peristalsis, however patient has never had a colonoscopy - GI consulted as above, may benefit from colonoscopy   DVT prophylaxis: SCDs Code Status:   Full Family Communication:  Daugther, Garnette Scheuermann at [phone], and updated  Disposition Plan:   Patient is from:  Home  Anticipated DC to:  Pending work up  Anticipated DC barriers: Further workup   Consults called:  GI, Mansouraty Admission status:  Inpatient, progressive  Severity of Illness: The appropriate patient status for this patient is INPATIENT. Inpatient status is judged to be reasonable and necessary in order to provide the required intensity of service to ensure the patient's safety. The patient's presenting symptoms, physical exam findings, and initial radiographic and laboratory data in the context of their chronic comorbidities is felt to place them at high risk for further clinical deterioration. Furthermore, it is not anticipated that the patient will be medically stable for discharge from the hospital within 2 midnights of admission. The following factors support the patient status of inpatient.   " The patient's presenting symptoms include abdominal pain, back pain. " The worrisome physical exam findings include epigastric pain. " The initial radiographic and laboratory data are worrisome because of metabolic acidosis, DKA, pancreatic inflammation and mass. " The chronic co-morbidities include hypertension.   * I certify that at the point of admission it is my clinical judgment that the patient will require inpatient hospital care spanning beyond 2 midnights from the point of admission due to high intensity of service, high risk for further deterioration and high frequency of surveillance required.Marcelyn Bruins MD [facility]  How to contact the [facility] Attending or Consulting provider [place] or covering provider  during after hours [place], for this patient?   1. Check the care team in [facility] and look for a) attending/consulting [facility] provider listed and b) the [facility] team listed 2. Log into [email or web address] and use [place]'s universal password to access. If you do not have the password, please contact the hospital operator. 3. Locate the [facility] provider you are looking for under [facility] and page to a number that you can be directly reached. 4. If you still have difficulty reaching the provider, please page the [facility] (Director on Call) for the Hospitalists listed on amion for assistance.  11/17/2019, 8:10 PM

## 2019-11-17 NOTE — ED Notes (Signed)
Pt has been in c-t for 20 minutes

## 2019-11-17 NOTE — ED Notes (Signed)
CBG 212 

## 2019-11-17 NOTE — ED Provider Notes (Signed)
[facility]    CSN: [id number] Arrival date & time: 11/17/19  8563      History   Chief Complaint Chief Complaint  Patient presents with  . Back Pain    HPI Nancy Hawkins is a 79 y.o. female  With history of hypertension, diabetes, GERD for bilateral low back pain, generalized abdominal pain, and nausea.  States this is been ongoing and intermittent since she got her flu vaccine a month ago.  States that she was recently started on Metformin, though "cannot stomach it".  Denies vomiting.  States most recent episode of this began yesterday.  Denies fever, cough, chest pain or palpitations, dizziness or lightheadedness.  Does endorse weakness.  Denies urinary symptoms such as dysuria, frequency, urgency.  Does have history of gas: States it feels similar.    Past Medical History:  Diagnosis Date  . Diabetes mellitus without complication ([place])   . GERD (gastroesophageal reflux disease)   . Hypercholesteremia   . Hypertension     There are no problems to display for this patient.   Past Surgical History:  Procedure Laterality Date  . ABDOMINAL HYSTERECTOMY    . CHOLECYSTECTOMY    . NASAL SINUS SURGERY      OB History   No obstetric history on file.      Home Medications    Prior to Admission medications   Medication Sig Start Date End Date Taking? Authorizing Provider  diltiazem (TIAZAC) 120 MG 24 hr capsule Take 120 mg by mouth daily.   Yes [provider]  telmisartan (MICARDIS) 40 MG tablet Take 40 mg by mouth daily.   Yes [provider]    Family History Family History  Family history unknown: Yes    Social History Social History   Tobacco Use  . Smoking status: Never Smoker  . Smokeless tobacco: Never Used  Substance Use Topics  . Alcohol use: Not Currently  . Drug use: Never     Allergies   Cephalosporins, Erythromycin, and Penicillins   Review of Systems As per HPI   Physical Exam Triage Vital Signs ED  Triage Vitals  Enc Vitals Group     BP      Pulse      Resp      Temp      Temp src      SpO2      Weight      Height      Head Circumference      Peak Flow      Pain Score      Pain Loc      Pain Edu?      Excl. in [place]?    No data found.  Updated Vital Signs BP (!) 129/57 (BP Location: Left Arm)   Pulse 82   Temp 97.9 F (36.6 C) (Oral)   Resp 20   SpO2 97%   Visual Acuity Right Eye Distance:   Left Eye Distance:   Bilateral Distance:    Right Eye Near:   Left Eye Near:    Bilateral Near:     Physical Exam Constitutional:      General: She is not in acute distress.    Appearance: She is ill-appearing. She is not toxic-appearing or diaphoretic.     Comments: Second to pain  HENT:     Head: Normocephalic and atraumatic.     Mouth/Throat:     Mouth: Mucous membranes are moist.     Pharynx: Oropharynx  is clear.  Eyes:     General: No scleral icterus.    Conjunctiva/sclera: Conjunctivae normal.     Pupils: Pupils are equal, round, and reactive to light.  Cardiovascular:     Rate and Rhythm: Normal rate and regular rhythm.  Pulmonary:     Effort: No respiratory distress.     Breath sounds: No wheezing.     Comments: Intermittent tachypnea that correlates with pain Abdominal:     Palpations: Abdomen is soft.     Tenderness: There is abdominal tenderness.     Comments: diffuse  Musculoskeletal:        General: No swelling or tenderness. Normal range of motion.  Skin:    Coloration: Skin is not jaundiced or pale.     Findings: No rash.  Neurological:     Mental Status: She is alert and oriented to person, place, and time.      UC Treatments / Results  Labs (all labs ordered are listed, but only abnormal results are displayed) Labs Reviewed  POCT URINALYSIS DIP (MANUAL ENTRY) - Abnormal; Notable for the following components:      Result Value   Glucose, UA =500 (*)    Bilirubin, UA small (*)    Ketones, POC UA >= (160) (*)    Spec Grav, UA >=1.030  (*)    Blood, UA trace-lysed (*)    Protein Ur, POC =30 (*)    All other components within normal limits  POCT FASTING CBG [facility] MANUAL ENTRY - Abnormal; Notable for the following components:   POCT Glucose ([facility]) 271 (*)    All other components within normal limits    EKG   Radiology No results found.  Procedures Procedures (including critical care time)  Medications Ordered in UC Medications - No data to display  Initial Impression / Assessment and Plan / UC Course  I have reviewed the triage vital signs and the nursing notes.  Pertinent labs & imaging results that were available during my care of the patient were reviewed by me and considered in my medical decision making (see chart for details).     Patient afebrile, nontoxic in office.  Hemodynamically stable and with reassuring cardiopulmonary exam.  CBG 271.  Urine dipstick with glucose, bilirubin, ketones, blood and protein.  Patient is in significant pain: Given age, comorbidities, urine dipstick, and poorly managed diabetes, recommend patient go to ER for further evaluation.  Electing to transport in stable condition and personal vehicle with family member.  Return precautions discussed, pt verbalized understanding and is agreeable to plan. Final Clinical Impressions(s) / UC Diagnoses   Final diagnoses:  Acute bilateral low back pain without sciatica  Generalized abdominal pain  Nausea without vomiting  Uncontrolled type 2 diabetes mellitus with hyperglycemia ([place])  Ketonuria  Glucosuria   Discharge Instructions   None    ED Prescriptions    None     PDMP not reviewed this encounter.   Neldon Mc [place], [place] 11/17/19 [phone]

## 2019-11-17 NOTE — ED Provider Notes (Signed)
[facility] EMERGENCY DEPARTMENT Provider Note   CSN: [id number] Arrival date & time: 11/17/19  0277     History Chief Complaint  Patient presents with  . Abdominal Pain    Nancy Hawkins is a 79 y.o. female.  Pt presents to the ED today with abd pain and low back pain.  Pt has had severe GERD.  Her pcp has put her on protonix and pepcid and the GERD remains bad.  Pt also has a new diagnosis of DM.  She was put on Metformin, but could not tolerate it.  Pt told her pcp that she would try something else when her stomach calmed down.  Pt said her pain worsened today.  She went to UC this am and was sent to the ED for further eval.  No f/c.  +nausea.  No vomiting.        Past Medical History:  Diagnosis Date  . Diabetes mellitus without complication ([place])   . GERD (gastroesophageal reflux disease)   . Hypercholesteremia   . Hypertension     There are no problems to display for this patient.   Past Surgical History:  Procedure Laterality Date  . ABDOMINAL HYSTERECTOMY    . CHOLECYSTECTOMY    . NASAL SINUS SURGERY       OB History   No obstetric history on file.     Family History  Family history unknown: Yes    Social History   Tobacco Use  . Smoking status: Never Smoker  . Smokeless tobacco: Never Used  Substance Use Topics  . Alcohol use: Not Currently  . Drug use: Never    Home Medications Prior to Admission medications   Medication Sig Start Date End Date Taking? Authorizing Provider  diltiazem (TIAZAC) 120 MG 24 hr capsule Take 120 mg by mouth daily.   Yes [provider]  telmisartan (MICARDIS) 40 MG tablet Take 40 mg by mouth daily.   Yes [provider]    Allergies    Cephalosporins, Erythromycin, and Penicillins  Review of Systems   Review of Systems  Gastrointestinal: Positive for abdominal pain and nausea.  All other systems reviewed and are negative.   Physical Exam Updated Vital Signs BP (!)  148/72   Pulse 87   Temp 98 F (36.7 C) (Oral)   Resp 20   Wt 65.3 kg   SpO2 99%   Physical Exam Vitals and nursing note reviewed.  Constitutional:      Appearance: She is well-developed.  HENT:     Head: Normocephalic.     Mouth/Throat:     Mouth: Mucous membranes are dry.  Cardiovascular:     Rate and Rhythm: Normal rate and regular rhythm.     Heart sounds: Normal heart sounds.  Pulmonary:     Effort: Pulmonary effort is normal.     Breath sounds: Normal breath sounds.  Abdominal:     General: Abdomen is flat. Bowel sounds are normal.     Palpations: Abdomen is soft.     Tenderness: There is generalized abdominal tenderness.  Skin:    General: Skin is warm.     Capillary Refill: Capillary refill takes less than 2 seconds.  Neurological:     General: No focal deficit present.     Mental Status: She is alert and oriented to person, place, and time.  Psychiatric:        Mood and Affect: Mood normal.        Behavior:  Behavior normal.     ED Results / Procedures / Treatments   Labs (all labs ordered are listed, but only abnormal results are displayed) Labs Reviewed  COMPREHENSIVE METABOLIC PANEL - Abnormal; Notable for the following components:      Result Value   Sodium 134 (*)    CO2 11 (*)    Glucose, Bld 291 (*)    Albumin 2.9 (*)    AST 9 (*)    Anion gap 17 (*)    All other components within normal limits  CBC - Abnormal; Notable for the following components:   WBC 15.2 (*)    All other components within normal limits  URINALYSIS, ROUTINE W REFLEX MICROSCOPIC - Abnormal; Notable for the following components:   Glucose, UA >=500 (*)    Ketones, ur 80 (*)    Protein, ur 30 (*)    All other components within normal limits  BETA-HYDROXYBUTYRIC ACID - Abnormal; Notable for the following components:   Beta-Hydroxybutyric Acid 5.38 (*)    All other components within normal limits  I-STAT VENOUS BLOOD GAS, ED - Abnormal; Notable for the following components:     pH, Ven 7.203 (*)    pCO2, Ven 27.4 (*)    Bicarbonate 10.8 (*)    TCO2 12 (*)    Acid-base deficit 16.0 (*)    All other components within normal limits  RESPIRATORY PANEL BY RT PCR (FLU A&B, COVID)  LIPASE, BLOOD  TSH  BASIC METABOLIC PANEL  CBG MONITORING, ED  CBG MONITORING, ED    EKG None  Radiology CT ABDOMEN PELVIS W CONTRAST  Result Date: 11/17/2019 CLINICAL DATA:  Patient with generalized abdominal pain and nausea. EXAM: CT ABDOMEN AND PELVIS WITH CONTRAST TECHNIQUE: Multidetector CT imaging of the abdomen and pelvis was performed using the standard protocol following bolus administration of intravenous contrast. CONTRAST:  143mL OMNIPAQUE IOHEXOL 300 MG/ML  SOLN COMPARISON:  None. FINDINGS: Lower chest: Normal heart size. Lung bases are clear. No pleural effusion. Hepatobiliary: Liver is normal in size and contour. Prior cholecystectomy. No intrahepatic or extrahepatic biliary ductal dilatation. Pancreas: There is suggestion of inflammation involving the mid body of the pancreas with peripancreatic fat stranding. Additionally, within the mid body of the pancreas there is a possible 2.5 x 2.2 cm low-attenuation mass. This appears to abut the celiac axis as well as the splenic vein. The splenic vein is narrowed (image 25; series 3). There is a small amount of fat stranding about the pancreas. Spleen: Unremarkable Adrenals/Urinary Tract: Normal adrenal glands. Kidneys enhance symmetrically with contrast. Bilateral subcentimeter too small to characterize low-attenuation renal lesions. Partially exophytic cyst mid pole left kidney. No hydronephrosis. Urinary bladder is unremarkable. Stomach/Bowel: Descending and sigmoid colonic diverticulosis. No CT evidence for acute diverticulitis. There is focal narrowing of the ascending colon (image 46; series 3). Normal morphology of the stomach. No evidence for small bowel obstruction. No free fluid or free intraperitoneal air. Vascular/Lymphatic:  Normal caliber abdominal aorta. Peripheral calcified atherosclerotic plaque. Retroaortic left renal vein. No retroperitoneal adenopathy. Reproductive: Prior hysterectomy. Other: None. Musculoskeletal: Lower thoracic and lumbar spine degenerative changes. Age-indeterminate compression deformity of the superior L1 endplate and superior L3 endplate. Addition there is age-indeterminate height loss of the T11 vertebral body. IMPRESSION: 1. There is inflammation involving the mid body the pancreas which may be secondary to pancreatitis. Additionally, findings are concerning for the possibility of a mass within the mid body of the pancreas. Given the overall findings, recommend dedicated evaluation of the  pancreas with pre and post contrast-enhanced abdominal MRI. 2. There is focal narrowing of the ascending colon. While this may be secondary to peristalsis, recommend correlation with the patient's colon cancer screening history. If screening is not up-to-date, appropriate screening should be considered. 3. Age-indeterminate compression deformities of the superior L1 and L3 vertebral bodies. Recommend correlation with point tenderness. 4. Sigmoid colonic diverticulosis without CT evidence for acute diverticulitis. Electronically Signed   By: Lovey Newcomer M.D.   On: 11/17/2019 17:42    Procedures Procedures (including critical care time)  Medications Ordered in ED Medications  insulin regular, human (MYXREDLIN) 100 units/ 100 mL infusion (6 Units/hr Intravenous Rate/Dose Change 11/17/19 1823)  lactated ringers infusion ( Intravenous New Bag/Given 11/17/19 1759)  dextrose 5 % in lactated ringers infusion ( Intravenous New Bag/Given 11/17/19 1728)  dextrose 50 % solution 0-50 mL (has no administration in time range)  potassium chloride 10 mEq in 100 mL IVPB (10 mEq Intravenous New Bag/Given 11/17/19 1824)  sodium chloride 0.9 % bolus 1,000 mL (0 mLs Intravenous Stopped 11/17/19 1729)  morphine 4 MG/ML injection 4  mg (4 mg Intravenous Given 11/17/19 1625)  lactated ringers bolus 1,306 mL (0 mLs Intravenous Stopped 11/17/19 1758)  iohexol (OMNIPAQUE) 300 MG/ML solution 100 mL (100 mLs Intravenous Contrast Given 11/17/19 1646)    ED Course  I have reviewed the triage vital signs and the nursing notes.  Pertinent labs & imaging results that were available during my care of the patient were reviewed by me and considered in my medical decision making (see chart for details).    MDM Rules/Calculators/A&P                          CT scan shows acute pancreatitis with possible pancreatic mass.  Pancreatitis is likely why pt is now a diabetic. Pt notified.    Pt is also in DKA.  She is started on insulin and given IVFs.  Pt d/w Dr. Trilby Drummer (triad) for admission.  CRITICAL CARE Performed by: Isla Pence   Total critical care time: 45 minutes  Critical care time was exclusive of separately billable procedures and treating other patients.  Critical care was necessary to treat or prevent imminent or life-threatening deterioration.  Critical care was time spent personally by me on the following activities: development of treatment plan with patient and/or surrogate as well as nursing, discussions with consultants, evaluation of patient's response to treatment, examination of patient, obtaining history from patient or surrogate, ordering and performing treatments and interventions, ordering and review of laboratory studies, ordering and review of radiographic studies, pulse oximetry and re-evaluation of patient's condition.  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

## 2019-11-17 NOTE — ED Notes (Signed)
Attempts x 2  Calling tts number no one answering  The pt was awake

## 2019-11-17 NOTE — ED Notes (Signed)
Mri has called about this pt.  The pt is still on the endotool  Cannot go to mri  yet

## 2019-11-18 ENCOUNTER — Inpatient Hospital Stay (HOSPITAL_COMMUNITY): Payer: Medicare Other

## 2019-11-18 DIAGNOSIS — K8689 Other specified diseases of pancreas: Secondary | ICD-10-CM | POA: Diagnosis not present

## 2019-11-18 DIAGNOSIS — E0810 Diabetes mellitus due to underlying condition with ketoacidosis without coma: Secondary | ICD-10-CM

## 2019-11-18 DIAGNOSIS — R933 Abnormal findings on diagnostic imaging of other parts of digestive tract: Secondary | ICD-10-CM

## 2019-11-18 LAB — BASIC METABOLIC PANEL
Anion gap: 8 (ref 5–15)
Anion gap: 8 (ref 5–15)
Anion gap: 10 (ref 5–15)
Anion gap: 11 (ref 5–15)
BUN: 8 mg/dL (ref 8–23)
BUN: 8 mg/dL (ref 8–23)
BUN: 8 mg/dL (ref 8–23)
BUN: 7 mg/dL — ABNORMAL LOW (ref 8–23)
CO2: 16 mmol/L — ABNORMAL LOW (ref 22–32)
CO2: 18 mmol/L — ABNORMAL LOW (ref 22–32)
CO2: 16 mmol/L — ABNORMAL LOW (ref 22–32)
CO2: 17 mmol/L — ABNORMAL LOW (ref 22–32)
Calcium: 8.2 mg/dL — ABNORMAL LOW (ref 8.9–10.3)
Calcium: 8.4 mg/dL — ABNORMAL LOW (ref 8.9–10.3)
Calcium: 8.4 mg/dL — ABNORMAL LOW (ref 8.9–10.3)
Calcium: 9.1 mg/dL (ref 8.9–10.3)
Chloride: 109 mmol/L (ref 98–111)
Chloride: 110 mmol/L (ref 98–111)
Chloride: 109 mmol/L (ref 98–111)
Chloride: 108 mmol/L (ref 98–111)
Creatinine, Ser: 0.49 mg/dL (ref 0.44–1.00)
Creatinine, Ser: 0.45 mg/dL (ref 0.44–1.00)
Creatinine, Ser: 0.5 mg/dL (ref 0.44–1.00)
Creatinine, Ser: 0.54 mg/dL (ref 0.44–1.00)
GFR, Estimated: >60 mL/min (ref >60)
GFR, Estimated: >60 mL/min (ref >60)
GFR, Estimated: >60 mL/min (ref >60)
GFR, Estimated: >60 mL/min (ref >60)
Glucose, Bld: 167 mg/dL — ABNORMAL HIGH (ref 70–99)
Glucose, Bld: 123 mg/dL — ABNORMAL HIGH (ref 70–99)
Glucose, Bld: 123 mg/dL — ABNORMAL HIGH (ref 70–99)
Glucose, Bld: 136 mg/dL — ABNORMAL HIGH (ref 70–99)
Potassium: 3.4 mmol/L — ABNORMAL LOW (ref 3.5–5.1)
Potassium: 3.1 mmol/L — ABNORMAL LOW (ref 3.5–5.1)
Potassium: 2.8 mmol/L — ABNORMAL LOW (ref 3.5–5.1)
Potassium: 3 mmol/L — ABNORMAL LOW (ref 3.5–5.1)
Sodium: 133 mmol/L — ABNORMAL LOW (ref 135–145)
Sodium: 136 mmol/L (ref 135–145)
Sodium: 135 mmol/L (ref 135–145)
Sodium: 136 mmol/L (ref 135–145)

## 2019-11-18 LAB — GLUCOSE, CAPILLARY
Glucose-Capillary: 157 mg/dL — ABNORMAL HIGH (ref 70–99)
Glucose-Capillary: 157 mg/dL — ABNORMAL HIGH (ref 70–99)
Glucose-Capillary: 125 mg/dL — ABNORMAL HIGH (ref 70–99)

## 2019-11-18 LAB — CBG MONITORING, ED
Glucose-Capillary: 112 mg/dL — ABNORMAL HIGH (ref 70–99)
Glucose-Capillary: 114 mg/dL — ABNORMAL HIGH (ref 70–99)
Glucose-Capillary: 118 mg/dL — ABNORMAL HIGH (ref 70–99)
Glucose-Capillary: 120 mg/dL — ABNORMAL HIGH (ref 70–99)
Glucose-Capillary: 123 mg/dL — ABNORMAL HIGH (ref 70–99)
Glucose-Capillary: 127 mg/dL — ABNORMAL HIGH (ref 70–99)
Glucose-Capillary: 134 mg/dL — ABNORMAL HIGH (ref 70–99)
Glucose-Capillary: 140 mg/dL — ABNORMAL HIGH (ref 70–99)
Glucose-Capillary: 140 mg/dL — ABNORMAL HIGH (ref 70–99)
Glucose-Capillary: 143 mg/dL — ABNORMAL HIGH (ref 70–99)
Glucose-Capillary: 169 mg/dL — ABNORMAL HIGH (ref 70–99)
Glucose-Capillary: 239 mg/dL — ABNORMAL HIGH (ref 70–99)

## 2019-11-18 LAB — BETA-HYDROXYBUTYRIC ACID
Beta-Hydroxybutyric Acid: 0.27 mmol/L (ref 0.05–0.27)
Beta-Hydroxybutyric Acid: 0.17 mmol/L (ref 0.05–0.27)

## 2019-11-18 LAB — MRSA PCR SCREENING: MRSA by PCR: NEGATIVE

## 2019-11-18 IMAGING — MR MR ABDOMEN WO/W CM MRCP
19 of 23 series · 43 of 48 positions shown · IV contrast (Gadavist)
Comparison: CT abdomen pelvis, [DATE]

CLINICAL DATA: Pancreatic mass

EXAM:
MRI ABDOMEN WITHOUT AND WITH CONTRAST (INCLUDING MRCP)
TECHNIQUE: Multiplanar multisequence MR imaging of the abdomen was performed
both before and after the administration of intravenous contrast.
Heavily T2-weighted images of the biliary and pancreatic ducts were
obtained, and three-dimensional MRCP images were rendered by post
processing.
CONTRAST:  6mL GADAVIST GADOBUTROL 1 MMOL/ML IV SOLN

[Series 4: cor haste · coronal · 6.0mm · 1.25mm/px · 1 of 35 slices shown]
[im 1/35]
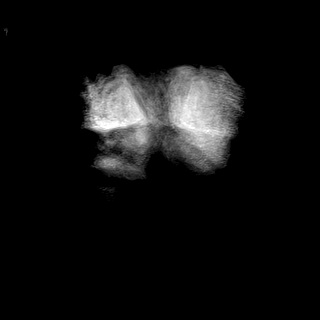

[Series 5: ax haste · axial · 6.0mm · 1.25mm/px · 1 of 40 slices shown]
[im 1/40]
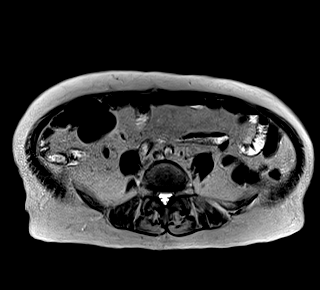

[Series 8: T2 fat-sat · axial · 6.0mm · 1.25mm/px · 1 of 40 slices shown]
[im 1/40]
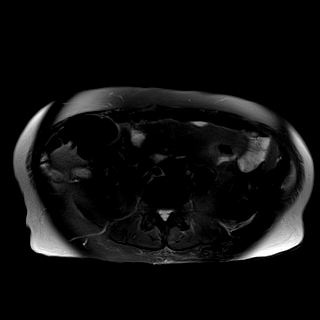

[Series 9: DWI · axial · 6.0mm · 1.49mm/px · z∈[-219,+62]mm · 3 of 120 slices shown (1 of 2)]
[im 1/120]
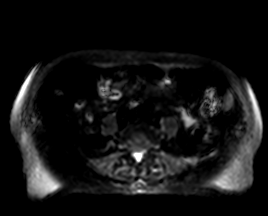
[im 60/120]
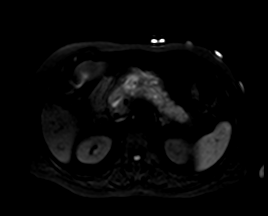
[im 120/120]
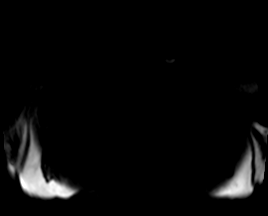

[Series 10: DWI · axial · 6.0mm · 1.49mm/px · 1 of 40 slices shown (2 of 2)]
[im 1/40]
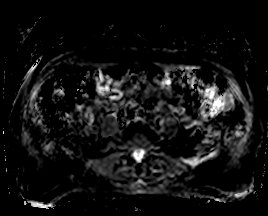

[Series 11: ax in and · axial · 3.0mm · 1.25mm/px · z∈[-221,+64]mm · 2 of 96 slices shown (1 of 2)]
[im 1/96]
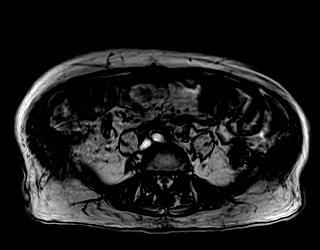
[im 96/96]
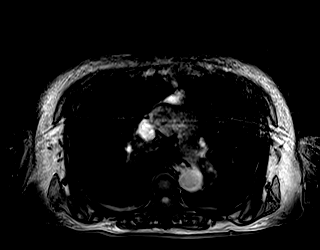

[Series 11: ax in and · axial · 3.0mm · 1.25mm/px · z∈[-221,+64]mm · 3 of 96 slices shown (2 of 2)]
[im 1/96]
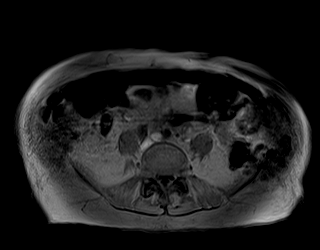
[im 48/96]
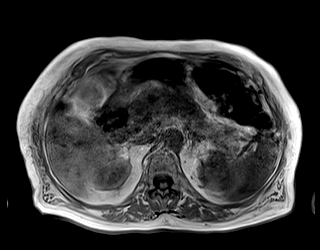
[im 96/96]
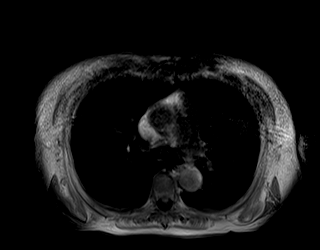

[Series 16: MRCP · coronal · 4.0mm · 1.12mm/px · 1 of 16 slices shown]
[im 1/16]
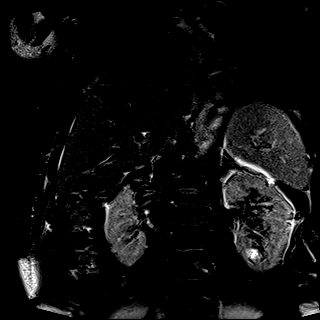

[Series 17: radials · coronal · 50.0mm · 0.78mm/px · 1 of 5 slices shown]
[im 1/5]
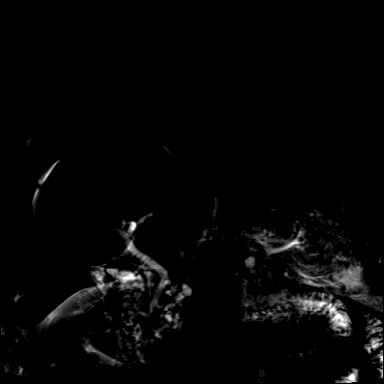

[Series 18: T1 dynamic · axial · non-contrast · 3.0mm · 1.25mm/px · z∈[-221,+64]mm · 3 of 96 slices shown (1 of 5)]
[im 1/96]
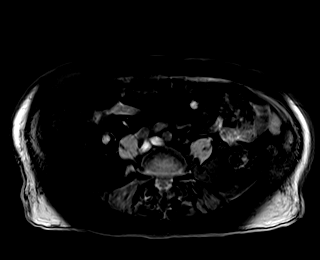
[im 48/96]
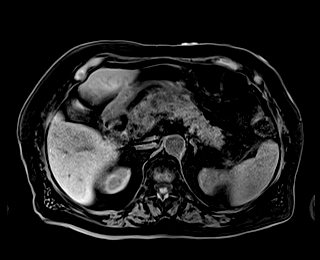
[im 96/96]
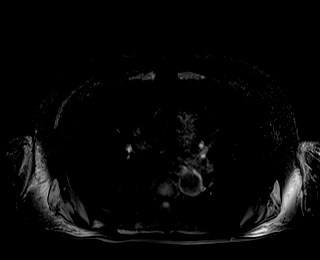

[Series 20: T1 dynamic post-contrast · axial · 3.0mm · 1.25mm/px · z∈[-221,+64]mm · 3 of 96 slices shown (1 of 5)]
[im 1/96]
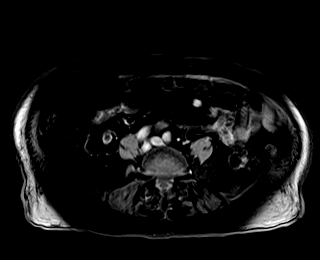
[im 48/96]
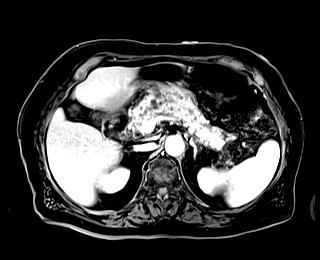
[im 96/96]
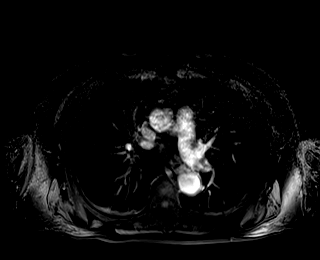

[Series 21: T1 dynamic · axial · 3.0mm · 1.25mm/px · z∈[-221,+64]mm · 3 of 96 slices shown (2 of 5)]
[im 1/96]
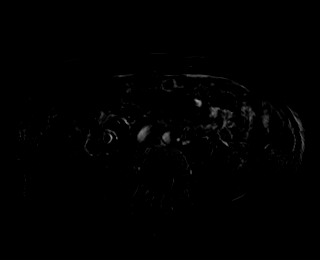
[im 48/96]
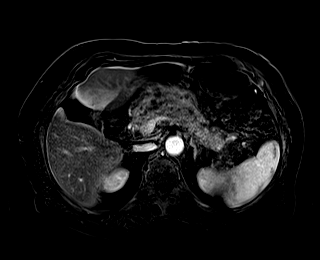
[im 96/96]
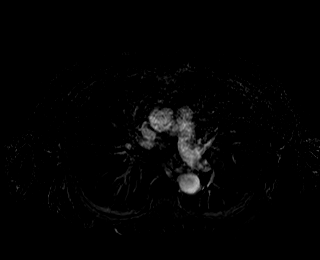

[Series 22: T1 dynamic post-contrast · axial · 3.0mm · 1.25mm/px · z∈[-221,+64]mm · 3 of 96 slices shown (2 of 5)]
[im 1/96]
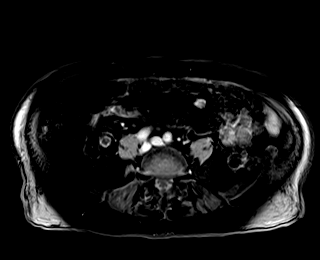
[im 48/96]
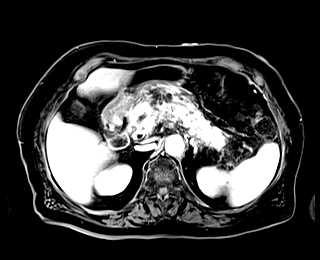
[im 96/96]
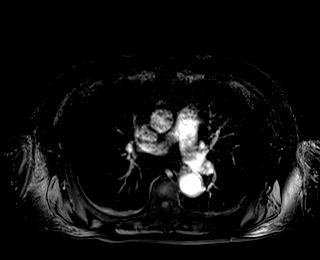

[Series 23: T1 dynamic · axial · 3.0mm · 1.25mm/px · z∈[-221,+64]mm · 3 of 96 slices shown (3 of 5)]
[im 1/96]
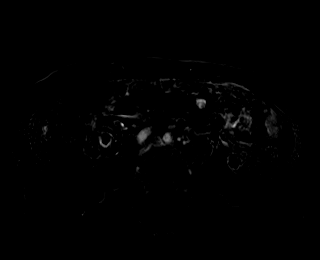
[im 48/96]
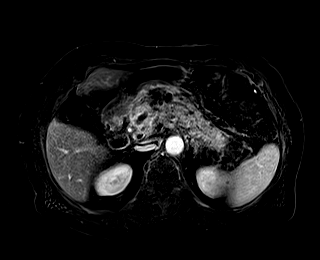
[im 96/96]
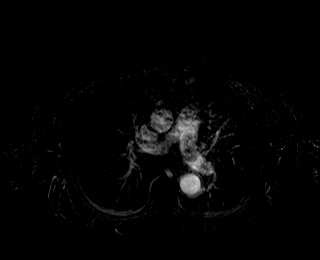

[Series 24: T1 dynamic post-contrast · axial · 3.0mm · 1.25mm/px · z∈[-221,+64]mm · 3 of 96 slices shown (3 of 5)]
[im 1/96]
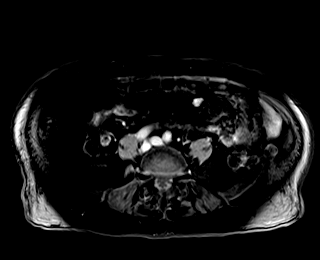
[im 48/96]
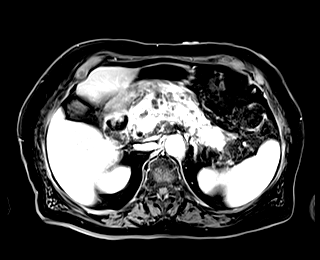
[im 96/96]
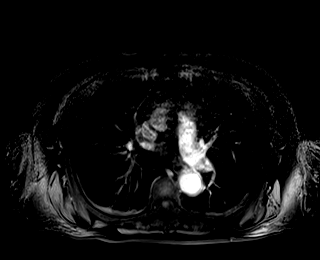

[Series 25: T1 dynamic · axial · 3.0mm · 1.25mm/px · z∈[-221,+64]mm · 3 of 96 slices shown (4 of 5)]
[im 1/96]
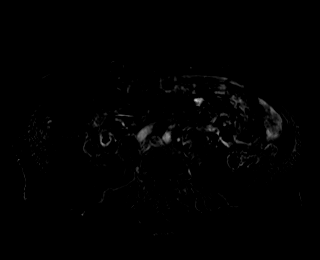
[im 48/96]
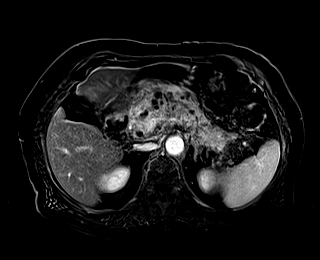
[im 96/96]
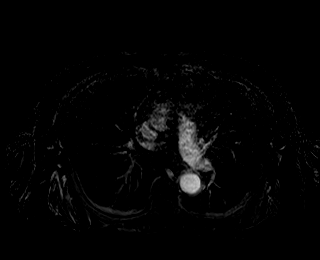

[Series 26: T1 dynamic post-contrast · axial · 3.0mm · 1.25mm/px · z∈[-221,+64]mm · 3 of 96 slices shown (4 of 5)]
[im 1/96]
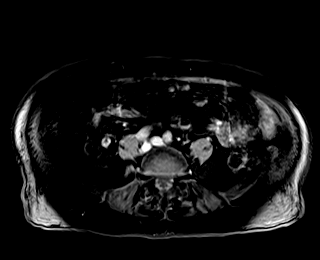
[im 48/96]
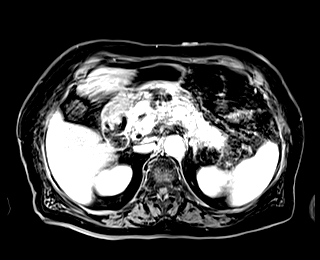
[im 96/96]
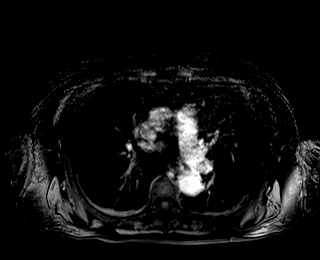

[Series 27: T1 dynamic · axial · 3.0mm · 1.25mm/px · z∈[-221,+64]mm · 3 of 96 slices shown (5 of 5)]
[im 1/96]
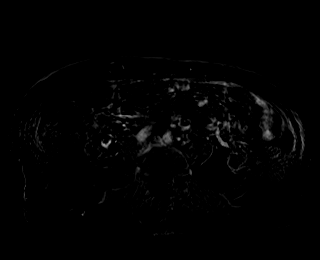
[im 48/96]
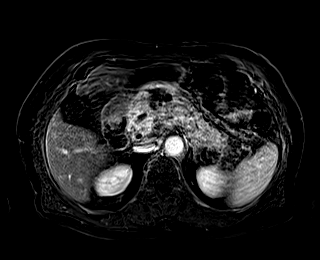
[im 96/96]
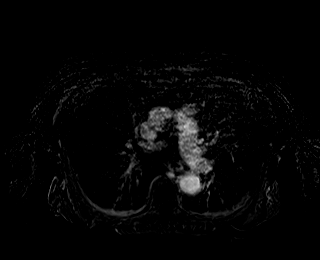

[Series 28: T1 dynamic post-contrast · coronal · 3.0mm · 1.31mm/px · 2 of 80 slices shown (5 of 5)]
[im 1/80]
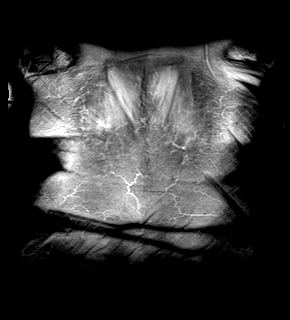
[im 80/80]
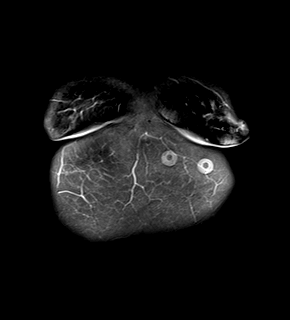

[43 of 48 positions shown; findings below may reference images not displayed]

FINDINGS: Lower chest: Trace bilateral pleural effusions and associated
atelectasis or consolidation.

Hepatobiliary: No mass or other parenchymal abnormality identified.
Status post cholecystectomy. No biliary ductal dilatation.

Pancreas: There is extensive inflammatory fat stranding about the
pancreas, with a thinly septated multi cystic fluid signal lesion of
the anterior pancreatic neck measuring 3.2 x 1.7 cm, without
evidence of solid component or contrast enhancement (series 5, image
21). There are numerous additional small fluid signal lesions
throughout the pancreatic parenchyma, the remaining lesions
subcentimeter. The central portion of the pancreatic duct is
dilated, measuring up to 8 mm (series 24, image 51).

Spleen:  Within normal limits in size and appearance.

Adrenals/Urinary Tract: No masses identified. No evidence of
hydronephrosis.

Stomach/Bowel: Visualized portions within the abdomen are
unremarkable.

Vascular/Lymphatic: No pathologically enlarged lymph nodes
identified. No abdominal aortic aneurysm demonstrated. The portal
vein appears somewhat compressed posterior to the pancreatic head,
perhaps due to edema in this vicinity, but remains patent.

Other:  Trace perihepatic ascites.

Musculoskeletal: No suspicious bone lesions identified.
IMPRESSION: 1. There is extensive inflammatory fat stranding about the pancreas,
with a thinly septated multi cystic fluid signal lesion of the
anterior pancreatic neck measuring 3.2 x 1.7 cm, without evidence of
solid component or contrast enhancement. This is most most
consistent with a pancreatic pseudocyst.
2. There are numerous additional small fluid signal lesions
throughout the pancreatic parenchyma, the remaining lesions
subcentimeter. These likely reflect additional pseudocysts and or
small IPMNs. Consider follow-up MRI at 6-12 months to assess for
stability.
3. The central portion of the pancreatic duct is dilated, measuring
up to 8 mm, without obvious obstructing lesion identified to the
ampulla.
4. Trace ascites.
5. Trace bilateral pleural effusions and associated atelectasis or
consolidation.

## 2019-11-18 MED ORDER — GADOBUTROL 1 MMOL/ML IV SOLN
6.0000 mL | Freq: Once | INTRAVENOUS | Status: AC | PRN
  Administered 2019-11-18: 6 mL via INTRAVENOUS

## 2019-11-18 MED ORDER — BISACODYL 5 MG PO TBEC
20.0000 mg | DELAYED_RELEASE_TABLET | Freq: Once | ORAL | Status: AC
  Administered 2019-11-19: 20 mg via ORAL
  Filled 2019-11-18: qty 4

## 2019-11-18 NOTE — Progress Notes (Signed)
Inpatient Diabetes Program Recommendations  AACE/ADA: New Consensus Statement on Inpatient Glycemic Control (2015)  Target Ranges:  Prepandial:   less than 140 mg/dL      Peak postprandial:   less than 180 mg/dL (1-2 hours)      Critically ill patients:  140 - 180 mg/dL   Lab Results  Component Value Date   GLUCAP 123 (H) 11/18/2019   HGBA1C 11.0 (H) 11/17/2019    Review of Glycemic Control Results for Nancy Hawkins, Nancy Hawkins (MRN [id number]) as of 11/18/2019 13:00  Ref. Range 11/18/2019 03:57 11/18/2019 06:03 11/18/2019 08:25 11/18/2019 09:33 11/18/2019 11:02  Glucose-Capillary Latest Ref Range: 70 - 99 mg/dL 140 (H) 114 (H) 143 (H) 112 (H) 123 (H)   Diabetes history: Newly diagnosed in past month- unable to tolerate metformin Current orders for Inpatient glycemic control:  IV insulin  Inpatient Diabetes Program Recommendations:    Note GI consult and "potential mass in pancreas?".  Appears to need insulin based on admission glucose and acidosis.    Consider transition to Lantus 10 units daily (2 hours prior to d/c of insulin drip).  Also consider "Very Sensitive" correction q 4 hours.   Will follow/ elevated A1C indicates likely need for insulin at d/c. Will follow.   Thanks,  Adah Perl, RN, BC-ADM Inpatient Diabetes Coordinator Pager [phone] (8a-5p)

## 2019-11-18 NOTE — ED Notes (Signed)
This RN paged the admitting Provider regarding this pts insulin infusion and whether it can be discontinued. Awaiting further orders.

## 2019-11-18 NOTE — ED Notes (Signed)
Per Admitting, Insulin Infusion and LR Infusion are to be paused for MRI and restarted once pt returns from MRI. Clear Liquid Diet can be attempted after MRI and if pt tolerates well, admitting is to be contacted regarding possibly discontinuing Insulin Infusion.

## 2019-11-18 NOTE — ED Notes (Signed)
EndoTool recommended consulting with physician to transition to SQ insulin; per provider on call, pt to stay on EndoTool with IV insulin, pending consult by GI in AM.

## 2019-11-18 NOTE — ED Notes (Signed)
Dinner Tray Order @ [phone].

## 2019-11-18 NOTE — Progress Notes (Signed)
PROGRESS NOTE    Nancy Hawkins  GBT:[id number] DOB: 05/31/1940 DOA: 11/17/2019 PCP: Lilian Coma., MD   Brief Narrative:  Nancy Hawkins is a 79 y.o. female with medical history significant of HTN and HLD who presents for worsening intermittent abdominal and back pain. She states she has had intermittent abdominal pain and low back pain for about 1 months. She has seen her primary care doctor a couple of times since the pain began. At one point she was diagnosed with diabetes and prescribed Metformin (which she did not tolerate and stopped taking), she was thought to have GERD and given a trial of PPI, which has not helped her symptoms. The pain has remained intermittent, but has progressively worsened to a 10/10 last night, prompting her to visit urgent care leading to a subsequent transfer to the ED. she does not report specific aggravating aggravating or alleviating factors.  Pain at times feels like it is radiating around both sides to her back. she has intermittent associated nausea without vomiting but does not necessarily correlate with her intermittent pain.  She also reports a degree of constipation for the last several days.  She denies vomiting, diarrhea, chest pain, shortness of breath.  She has never had a colonoscopy. In ED: Lab work significant for glucose of 300, bicarb 11 with gap of 17.  Ketones present in the urine and beta hydroxybutyrate level of elevated at 5.38.  Lipase normal at 34.  VBG showed pH 7.2 and PCO2 of 27 consistent with metabolic acidosis with respiratory compensation.  CT abdomen pelvis showed inflammation of the mid body the pancreas with possibility of mass in the mid body of the pancreas with recommendation for dedicated pre and post contrast-enhanced MRI.  Also noted on CT were focal narrowing of the ascending colon and age-indeterminate compression of superior L1 and L3 vertebral bodies.   Assessment & Plan:   Active Problems:   DKA (diabetic ketoacidosis) ([place])    Pancreatic mass  DKA likely provoked by acute pancreatitis as below, POA, improving - Continue insulin gtt and IVF while NPO 2/2 pancreatitis as below - Anion gap closed - unable to advance given NPO status - Will transition once patient able to tolerate PO safely  Pancreatic inflammation Pancreatic Mass - GI following - MRI/MRCP pending - Lipase normal - Check CA 19-9 level - Hold any anticoagulation - Remain NPO for acute pancreatitis and possible need for intervention/procedure with GI  HTN - Continue home ARB and Dilt  Focal narrowing of ascending colon - Radiologist read notes this may be due to peristalsis, however patient has never had a colonoscopy - GI consulted as above, may benefit from colonoscopy   DVT prophylaxis:      SCDs Code Status:              Full Family Communication: None present  Status is: INPT  Dispo: The patient is from: Home              Anticipated d/c is to: TBD              Anticipated d/c date is: TBD - likely 72-96h pending clinical course              Patient currently NOT  medically stable for discharge  Consultants:   GI  Procedures:   Pending imaging and GI re-evaluation - discussed possible EUS/endoscopy(upper/lower)  Antimicrobials:  None indicated   Subjective: Pain well controlled, denies vomiting, diarrhea, constipation.  Objective: Vitals:  11/18/19 0615 11/18/19 0630 11/18/19 0645 11/18/19 0700  BP: (!) 126/57 (!) 125/49 (!) 130/52 (!) 126/53  Pulse: 82 80 76 81  Resp: 18 13 17 15   Temp:      TempSrc:      SpO2: 97% 98% 95% 98%  Weight:      Height:        Intake/Output Summary (Last 24 hours) at 11/18/2019 0827 Last data filed at 11/17/2019 1759 Gross per 24 hour  Intake 2306 ml  Output --  Net 2306 ml   Filed Weights   11/17/19 1003 11/18/19 0025  Weight: 65.3 kg 63 kg    Examination:  General exam: Appears calm and comfortable  Respiratory system: Clear to auscultation. Respiratory effort  normal. Cardiovascular system: S1 & S2 heard, RRR. No JVD, murmurs, rubs, gallops or clicks. No pedal edema. Gastrointestinal system: Abdomen is nondistended, soft and moderately tender at epigastrium. Normal bowel sounds heard. Central nervous system: Alert and oriented. No focal neurological deficits. Extremities: Symmetric 5 x 5 power. Skin: No rashes, lesions or ulcers Psychiatry: Judgement and insight appear normal. Mood & affect appropriate.     Data Reviewed: I have personally reviewed following labs and imaging studies  CBC: Recent Labs  Lab 11/17/19 1013 11/17/19 1523  WBC 15.2*  --   HGB 12.3 13.6  HCT 39.1 40.0  MCV 90.5  --   PLT 287  --    Basic Metabolic Panel: Recent Labs  Lab 11/17/19 1013 11/17/19 1013 11/17/19 1523 11/17/19 1810 11/17/19 1918 11/18/19 0045 11/18/19 0237  NA 134*   < > 136 137 135 133* 136  K 3.7   < > 3.8 3.7 3.6 3.4* 3.1*  CL 106  --   --  111 110 109 110  CO2 11*  --   --  11* 13* 16* 18*  GLUCOSE 291*  --   --  206* 188* 167* 123*  BUN 14  --   --  11 10 8 8   CREATININE 0.75  --   --  0.60 0.59 0.49 0.45  CALCIUM 8.9  --   --  8.1* 8.2* 8.2* 8.4*   < > = values in this interval not displayed.   GFR: Estimated Creatinine Clearance: 49.2 mL/min (by C-G formula based on SCr of 0.45 mg/dL). Liver Function Tests: Recent Labs  Lab 11/17/19 1013  AST 9*  ALT 16  ALKPHOS 111  BILITOT 0.6  PROT 7.0  ALBUMIN 2.9*   Recent Labs  Lab 11/17/19 1013  LIPASE 34   No results for input(s): AMMONIA in the last 168 hours. Coagulation Profile: No results for input(s): INR, PROTIME in the last 168 hours. Cardiac Enzymes: No results for input(s): CKTOTAL, CKMB, CKMBINDEX, TROPONINI in the last 168 hours. BNP (last 3 results) No results for input(s): PROBNP in the last 8760 hours. HbA1C: Recent Labs    11/17/19 2027  HGBA1C 11.0*   CBG: Recent Labs  Lab 11/18/19 0022 11/18/19 0146 11/18/19 0312 11/18/19 0357 11/18/19 0603   GLUCAP 169* 140* 120* 140* 114*   Lipid Profile: No results for input(s): CHOL, HDL, LDLCALC, TRIG, CHOLHDL, LDLDIRECT in the last 72 hours. Thyroid Function Tests: Recent Labs    11/17/19 1745  TSH 0.539   Anemia Panel: No results for input(s): VITAMINB12, FOLATE, FERRITIN, TIBC, IRON, RETICCTPCT in the last 72 hours. Sepsis Labs: No results for input(s): PROCALCITON, LATICACIDVEN in the last 168 hours.  Recent Results (from the past 240 hour(s))  Respiratory Panel  by RT PCR (Flu A&B, Covid) - Nasopharyngeal Swab     Status: None   Collection Time: 11/17/19  5:35 PM   Specimen: Nasopharyngeal Swab  Result Value Ref Range Status   SARS Coronavirus 2 by RT PCR NEGATIVE NEGATIVE Final    Comment: (NOTE) SARS-CoV-2 target nucleic acids are NOT DETECTED.  The SARS-CoV-2 RNA is generally detectable in upper respiratoy specimens during the acute phase of infection. The lowest concentration of SARS-CoV-2 viral copies this assay can detect is 131 copies/mL. A negative result does not preclude SARS-Cov-2 infection and should not be used as the sole basis for treatment or other patient management decisions. A negative result may occur with  improper specimen collection/handling, submission of specimen other than nasopharyngeal swab, presence of viral mutation(s) within the areas targeted by this assay, and inadequate number of viral copies (<131 copies/mL). A negative result must be combined with clinical observations, patient history, and epidemiological information. The expected result is Negative.  Fact Sheet for Patients:  PinkCheek.be  Fact Sheet for Healthcare Providers:  GravelBags.it  This test is no t yet approved or cleared by the [place] FDA and  has been authorized for detection and/or diagnosis of SARS-CoV-2 by FDA under an Emergency Use Authorization (EUA). This EUA will remain  in effect (meaning this  test can be used) for the duration of the COVID-19 declaration under Section 564(b)(1) of the Act, 21 U.S.C. section 360bbb-3(b)(1), unless the authorization is terminated or revoked sooner.     Influenza A by PCR NEGATIVE NEGATIVE Final   Influenza B by PCR NEGATIVE NEGATIVE Final    Comment: (NOTE) The Xpert Xpress SARS-CoV-2/FLU/RSV assay is intended as an aid in  the diagnosis of influenza from Nasopharyngeal swab specimens and  should not be used as a sole basis for treatment. Nasal washings and  aspirates are unacceptable for Xpert Xpress SARS-CoV-2/FLU/RSV  testing.  Fact Sheet for Patients: PinkCheek.be  Fact Sheet for Healthcare Providers: GravelBags.it  This test is not yet approved or cleared by the [place] FDA and  has been authorized for detection and/or diagnosis of SARS-CoV-2 by  FDA under an Emergency Use Authorization (EUA). This EUA will remain  in effect (meaning this test can be used) for the duration of the  Covid-19 declaration under Section 564(b)(1) of the Act, 21  U.S.C. section 360bbb-3(b)(1), unless the authorization is  terminated or revoked. Performed at [facility], [place] [address], [place], [place] [address]          Radiology Studies: CT ABDOMEN PELVIS W CONTRAST  Result Date: 11/17/2019 CLINICAL DATA:  Patient with generalized abdominal pain and nausea. EXAM: CT ABDOMEN AND PELVIS WITH CONTRAST TECHNIQUE: Multidetector CT imaging of the abdomen and pelvis was performed using the standard protocol following bolus administration of intravenous contrast. CONTRAST:  15mL OMNIPAQUE IOHEXOL 300 MG/ML  SOLN COMPARISON:  None. FINDINGS: Lower chest: Normal heart size. Lung bases are clear. No pleural effusion. Hepatobiliary: Liver is normal in size and contour. Prior cholecystectomy. No intrahepatic or extrahepatic biliary ductal dilatation. Pancreas: There is suggestion of  inflammation involving the mid body of the pancreas with peripancreatic fat stranding. Additionally, within the mid body of the pancreas there is a possible 2.5 x 2.2 cm low-attenuation mass. This appears to abut the celiac axis as well as the splenic vein. The splenic vein is narrowed (image 25; series 3). There is a small amount of fat stranding about the pancreas. Spleen: Unremarkable Adrenals/Urinary Tract: Normal adrenal  glands. Kidneys enhance symmetrically with contrast. Bilateral subcentimeter too small to characterize low-attenuation renal lesions. Partially exophytic cyst mid pole left kidney. No hydronephrosis. Urinary bladder is unremarkable. Stomach/Bowel: Descending and sigmoid colonic diverticulosis. No CT evidence for acute diverticulitis. There is focal narrowing of the ascending colon (image 46; series 3). Normal morphology of the stomach. No evidence for small bowel obstruction. No free fluid or free intraperitoneal air. Vascular/Lymphatic: Normal caliber abdominal aorta. Peripheral calcified atherosclerotic plaque. Retroaortic left renal vein. No retroperitoneal adenopathy. Reproductive: Prior hysterectomy. Other: None. Musculoskeletal: Lower thoracic and lumbar spine degenerative changes. Age-indeterminate compression deformity of the superior L1 endplate and superior L3 endplate. Addition there is age-indeterminate height loss of the T11 vertebral body. IMPRESSION: 1. There is inflammation involving the mid body the pancreas which may be secondary to pancreatitis. Additionally, findings are concerning for the possibility of a mass within the mid body of the pancreas. Given the overall findings, recommend dedicated evaluation of the pancreas with pre and post contrast-enhanced abdominal MRI. 2. There is focal narrowing of the ascending colon. While this may be secondary to peristalsis, recommend correlation with the patient's colon cancer screening history. If screening is not up-to-date,  appropriate screening should be considered. 3. Age-indeterminate compression deformities of the superior L1 and L3 vertebral bodies. Recommend correlation with point tenderness. 4. Sigmoid colonic diverticulosis without CT evidence for acute diverticulitis. Electronically Signed   By: Lovey Newcomer M.D.   On: 11/17/2019 17:42    Scheduled Meds: . diltiazem  120 mg Oral Daily  . irbesartan  75 mg Oral Daily   Continuous Infusions: . dextrose 5% lactated ringers 125 mL/hr at 11/18/19 0242  . insulin 1.9 Units/hr (11/18/19 1314)  . lactated ringers 125 mL/hr at 11/17/19 1759     LOS: 1 day   Time spent: 39min  Clydell Sposito C Nishawn Rotan, DO [facility]  If 7PM-7AM, please contact night-coverage [email or web address]  11/18/2019, 8:27 AM

## 2019-11-18 NOTE — Consult Note (Addendum)
[facility] Consult: 8:15 AM 11/18/2019  LOS: 1 day    Referring Provider: Dr Avon Gully  Primary Care Physician:  Lilian Coma., MD Primary Gastroenterologist:  unassigned    Reason for Consultation: Abnormal pancreatic appearance on CT.  Back and abdominal pain.  Weight loss.   HPI: Nancy Hawkins is a 79 y.o. female.  PMH Htn. Recently started htn meds and Metformin.   S/p abd hysto, cholecystectomy.   Previous colonoscopy at distant location, 15 yrs ago.     4 weeks of what started out as bilateral flank pain in the lumbar region.  Radiated around into the lower abdomen.  Pain intermittent initially but in recent days has become more constant and more intense.  Around the same time she was initiated on Metformin for diabetes but this caused GI upset and she has not been able to tolerate it.  She also describes increased frequency of indigestion, heartburn, not so much nausea though.  Her PMD increased her baseline acid suppressing med (assume this is a PPI but it is not listed on her current med list) from once a day to twice a day and added Pepcid once a day.  Despite medication adjustments the symptoms continued.  Intermittent anorexia and 8 pound weight loss.  No jaundice, no itching.  BMs a bit less frequent, last BM was 2 days ago and normally she would have 1-2 daily.  Stools are brown, not acholic.  No significant alcohol intake history.  Presented to the ED for evaluation. Normotensive, afebrile, no HR derangements.  O2 sats low to upper 90s.   Lipase 34/  LFTs not elevated.  GFR normal.  K 3.1 WBCs 15.2.  No anemia, normal MCV CTAP w contrast: inflammation in mid body pancreas, suspicion of mass in same area.  Indeterminate narrowing ascending colon.  Simple sigmoid diverticulosis.   Spinal compression  fxs.   MRCP ordered.    Past Medical History:  Diagnosis Date  . Diabetes mellitus without complication ([place])   . GERD (gastroesophageal reflux disease)   . Hypercholesteremia   . Hypertension     Past Surgical History:  Procedure Laterality Date  . ABDOMINAL HYSTERECTOMY    . CHOLECYSTECTOMY    . NASAL SINUS SURGERY      Prior to Admission medications   Medication Sig Start Date End Date Taking? Authorizing Provider  diltiazem (TIAZAC) 120 MG 24 hr capsule Take 120 mg by mouth daily.   Yes [provider]  telmisartan (MICARDIS) 40 MG tablet Take 40 mg by mouth daily.   Yes [provider]    Scheduled Meds: . diltiazem  120 mg Oral Daily  . irbesartan  75 mg Oral Daily   Infusions: . dextrose 5% lactated ringers 125 mL/hr at 11/18/19 0242  . insulin 1.9 Units/hr (11/18/19 5170)  . lactated ringers 125 mL/hr at 11/17/19 1759   PRN Meds: dextrose, HYDROmorphone (DILAUDID) injection   Allergies as of 11/17/2019 - Review Complete 11/17/2019  Allergen Reaction Noted  . Cephalosporins Shortness Of Breath 11/17/2019  . Erythromycin Shortness  Of Breath 11/17/2019  . Penicillins Anaphylaxis 11/17/2019    Family History  Family history unknown: Yes    Social History   Socioeconomic History  . Marital status: Married    Spouse name: Not on file  . Number of children: Not on file  . Years of education: Not on file  . Highest education level: Not on file  Occupational History  . Not on file  Tobacco Use  . Smoking status: Never Smoker  . Smokeless tobacco: Never Used  Substance and Sexual Activity  . Alcohol use: Not Currently  . Drug use: Never  . Sexual activity: Not on file  Other Topics Concern  . Not on file  Social History Narrative  . Not on file   Social Determinants of Health   Financial Resource Strain:   . Difficulty of Paying Living Expenses: Not on file  Food Insecurity:   . Worried About Charity fundraiser in the Last  Year: Not on file  . Ran Out of Food in the Last Year: Not on file  Transportation Needs:   . Lack of Transportation (Medical): Not on file  . Lack of Transportation (Non-Medical): Not on file  Physical Activity:   . Days of Exercise per Week: Not on file  . Minutes of Exercise per Session: Not on file  Stress:   . Feeling of Stress : Not on file  Social Connections:   . Frequency of Communication with Friends and Family: Not on file  . Frequency of Social Gatherings with Friends and Family: Not on file  . Attends Religious Services: Not on file  . Active Member of Clubs or Organizations: Not on file  . Attends Archivist Meetings: Not on file  . Marital Status: Not on file  Intimate Partner Violence:   . Fear of Current or Ex-Partner: Not on file  . Emotionally Abused: Not on file  . Physically Abused: Not on file  . Sexually Abused: Not on file    REVIEW OF SYSTEMS: Constitutional: Some fatigue, no profound weakness. ENT:  No nose bleeds Pulm: No cough, no shortness of breath. CV:  No palpitations, no LE edema.  No angina. GU:  No hematuria, no frequency.  No orange-colored urine. GI: See HPI. Heme: No unusual bleeding or bruising. Transfusions: None Neuro:  No headaches, no peripheral tingling or numbness.  No syncope, no seizures. Derm:  No itching, no rash or sores.  Endocrine:  No sweats or chills.  No polyuria or dysuria Immunization: Not queried. Travel:  None beyond local counties in last few months.    PHYSICAL EXAM: Vital signs in last 24 hours: Vitals:   11/18/19 0645 11/18/19 0700  BP: (!) 130/52 (!) 126/53  Pulse: 76 81  Resp: 17 15  Temp:    SpO2: 95% 98%   Wt Readings from Last 3 Encounters:  11/18/19 63 kg    General: Patient looks a bit tired but otherwise well.  Resting comfortably on stretcher in the ED room. Head: No facial asymmetry or signs of head trauma. Eyes: No scleral icterus.  No conjunctival pallor.  EOMI Ears: Not hard  of hearing Nose: No congestion or discharge Mouth: Oropharynx moist, pink, clear. Neck: No JVD, masses, thyromegaly. Lungs: Clear bilaterally.  No labored breathing or cough. Heart: Regular rhythm in the 70s with frequent PVCs.  No MRG.  S1, S2 present. Abdomen: Not tender.  Active bowel sounds.  No distention.  No HSM, masses, bruits, hernias.  Rectal: Deferred. Musc/Skeltl: No joint redness, swelling or gross deformity. Extremities: No CCE. Neurologic: Alert.  Oriented x3.  Appropriate.  Moves all 4 limbs without gross weakness.  No tremors Skin: No jaundice, no significant purpura or bruising. Tattoos: None observed Nodes: No cervical adenopathy Psych: Cooperative, pleasant, calm.  Intake/Output from previous day: 10/10 0701 - 10/11 0700 In: 2306 [I.V.:2306] Out: -  Intake/Output this shift: No intake/output data recorded.  LAB RESULTS: Recent Labs    11/17/19 1013 11/17/19 1523  WBC 15.2*  --   HGB 12.3 13.6  HCT 39.1 40.0  PLT 287  --    BMET Lab Results  Component Value Date   NA 136 11/18/2019   NA 133 (L) 11/18/2019   NA 135 11/17/2019   K 3.1 (L) 11/18/2019   K 3.4 (L) 11/18/2019   K 3.6 11/17/2019   CL 110 11/18/2019   CL 109 11/18/2019   CL 110 11/17/2019   CO2 18 (L) 11/18/2019   CO2 16 (L) 11/18/2019   CO2 13 (L) 11/17/2019   GLUCOSE 123 (H) 11/18/2019   GLUCOSE 167 (H) 11/18/2019   GLUCOSE 188 (H) 11/17/2019   BUN 8 11/18/2019   BUN 8 11/18/2019   BUN 10 11/17/2019   CREATININE 0.45 11/18/2019   CREATININE 0.49 11/18/2019   CREATININE 0.59 11/17/2019   CALCIUM 8.4 (L) 11/18/2019   CALCIUM 8.2 (L) 11/18/2019   CALCIUM 8.2 (L) 11/17/2019   LFT Recent Labs    11/17/19 1013  PROT 7.0  ALBUMIN 2.9*  AST 9*  ALT 16  ALKPHOS 111  BILITOT 0.6   PT/INR No results found for: INR, PROTIME Hepatitis Panel No results for input(s): HEPBSAG, HCVAB, HEPAIGM, HEPBIGM in the last 72 hours. C-Diff No components found for: CDIFF Lipase       Component Value Date/Time   LIPASE 34 11/17/2019 1013    Drugs of Abuse  No results found for: LABOPIA, COCAINSCRNUR, LABBENZ, AMPHETMU, THCU, LABBARB   RADIOLOGY STUDIES: CT ABDOMEN PELVIS W CONTRAST  Result Date: 11/17/2019 CLINICAL DATA:  Patient with generalized abdominal pain and nausea. EXAM: CT ABDOMEN AND PELVIS WITH CONTRAST TECHNIQUE: Multidetector CT imaging of the abdomen and pelvis was performed using the standard protocol following bolus administration of intravenous contrast. CONTRAST:  153mL OMNIPAQUE IOHEXOL 300 MG/ML  SOLN COMPARISON:  None. FINDINGS: Lower chest: Normal heart size. Lung bases are clear. No pleural effusion. Hepatobiliary: Liver is normal in size and contour. Prior cholecystectomy. No intrahepatic or extrahepatic biliary ductal dilatation. Pancreas: There is suggestion of inflammation involving the mid body of the pancreas with peripancreatic fat stranding. Additionally, within the mid body of the pancreas there is a possible 2.5 x 2.2 cm low-attenuation mass. This appears to abut the celiac axis as well as the splenic vein. The splenic vein is narrowed (image 25; series 3). There is a small amount of fat stranding about the pancreas. Spleen: Unremarkable Adrenals/Urinary Tract: Normal adrenal glands. Kidneys enhance symmetrically with contrast. Bilateral subcentimeter too small to characterize low-attenuation renal lesions. Partially exophytic cyst mid pole left kidney. No hydronephrosis. Urinary bladder is unremarkable. Stomach/Bowel: Descending and sigmoid colonic diverticulosis. No CT evidence for acute diverticulitis. There is focal narrowing of the ascending colon (image 46; series 3). Normal morphology of the stomach. No evidence for small bowel obstruction. No free fluid or free intraperitoneal air. Vascular/Lymphatic: Normal caliber abdominal aorta. Peripheral calcified atherosclerotic plaque. Retroaortic left renal vein. No retroperitoneal adenopathy.  Reproductive: Prior hysterectomy. Other: None. Musculoskeletal: Lower thoracic and  lumbar spine degenerative changes. Age-indeterminate compression deformity of the superior L1 endplate and superior L3 endplate. Addition there is age-indeterminate height loss of the T11 vertebral body. IMPRESSION: 1. There is inflammation involving the mid body the pancreas which may be secondary to pancreatitis. Additionally, findings are concerning for the possibility of a mass within the mid body of the pancreas. Given the overall findings, recommend dedicated evaluation of the pancreas with pre and post contrast-enhanced abdominal MRI. 2. There is focal narrowing of the ascending colon. While this may be secondary to peristalsis, recommend correlation with the patient's colon cancer screening history. If screening is not up-to-date, appropriate screening should be considered. 3. Age-indeterminate compression deformities of the superior L1 and L3 vertebral bodies. Recommend correlation with point tenderness. 4. Sigmoid colonic diverticulosis without CT evidence for acute diverticulitis. Electronically Signed   By: Lovey Newcomer M.D.   On: 11/17/2019 17:42     IMPRESSION:   *    1 month of intermittent and now more constant low back and abdominal pain with anorexia, GI upset.   Lipase and LFTs normal.   CTAP shows inflammation and mass in the mid body of the pancreas.  Also reveals indeterminate narrowing of the ascending colon.  Patient had remote colonoscopy greater than 10 years ago.  *   DM2, not tolerating metformin added ~ 3 weeks ago.      PLAN:     *   Await completion of MRCP/MRI.  Await pndg CA 19-9/      *    Likely Endoscopic ultrasound with Dr. Rush Landmark, soonest will be Wednesday.   ? Pursue colonoscopy as well, will d/w MD.    *   Ok to feed pt carb mod diet after MRI.       *   Given elevated WBCs, but no obvious biliary obstruction, does she need abx?     Azucena Freed  11/18/2019, 8:15  AM Phone [phone]

## 2019-11-18 NOTE — ED Notes (Signed)
Pt transported to MRI 

## 2019-11-19 DIAGNOSIS — R933 Abnormal findings on diagnostic imaging of other parts of digestive tract: Secondary | ICD-10-CM | POA: Diagnosis not present

## 2019-11-19 DIAGNOSIS — K8689 Other specified diseases of pancreas: Secondary | ICD-10-CM | POA: Diagnosis not present

## 2019-11-19 DIAGNOSIS — E0810 Diabetes mellitus due to underlying condition with ketoacidosis without coma: Secondary | ICD-10-CM | POA: Diagnosis not present

## 2019-11-19 LAB — BASIC METABOLIC PANEL
Anion gap: 11 (ref 5–15)
BUN: 7 mg/dL — ABNORMAL LOW (ref 8–23)
CO2: 19 mmol/L — ABNORMAL LOW (ref 22–32)
Calcium: 8.6 mg/dL — ABNORMAL LOW (ref 8.9–10.3)
Chloride: 105 mmol/L (ref 98–111)
Creatinine, Ser: 0.46 mg/dL (ref 0.44–1.00)
GFR, Estimated: >60 mL/min (ref >60)
Glucose, Bld: 127 mg/dL — ABNORMAL HIGH (ref 70–99)
Potassium: 2.5 mmol/L — CL (ref 3.5–5.1)
Sodium: 135 mmol/L (ref 135–145)

## 2019-11-19 LAB — GLUCOSE, CAPILLARY
Glucose-Capillary: 117 mg/dL — ABNORMAL HIGH (ref 70–99)
Glucose-Capillary: 129 mg/dL — ABNORMAL HIGH (ref 70–99)
Glucose-Capillary: 145 mg/dL — ABNORMAL HIGH (ref 70–99)
Glucose-Capillary: 148 mg/dL — ABNORMAL HIGH (ref 70–99)
Glucose-Capillary: 167 mg/dL — ABNORMAL HIGH (ref 70–99)
Glucose-Capillary: 171 mg/dL — ABNORMAL HIGH (ref 70–99)
Glucose-Capillary: 176 mg/dL — ABNORMAL HIGH (ref 70–99)
Glucose-Capillary: 185 mg/dL — ABNORMAL HIGH (ref 70–99)
Glucose-Capillary: 193 mg/dL — ABNORMAL HIGH (ref 70–99)
Glucose-Capillary: 212 mg/dL — ABNORMAL HIGH (ref 70–99)

## 2019-11-19 LAB — CANCER ANTIGEN 19-9: CA 19-9: 228 U/mL — ABNORMAL HIGH (ref 0–35)

## 2019-11-19 LAB — MAGNESIUM: Magnesium: 1.5 mg/dL — ABNORMAL LOW (ref 1.7–2.4)

## 2019-11-19 MED ORDER — MAGNESIUM SULFATE 2 GM/50ML IV SOLN
2.0000 g | Freq: Once | INTRAVENOUS | Status: AC
  Administered 2019-11-19: 2 g via INTRAVENOUS
  Filled 2019-11-19: qty 50

## 2019-11-19 MED ORDER — OXYCODONE HCL 5 MG PO TABS
5.0000 mg | ORAL_TABLET | Freq: Four times a day (QID) | ORAL | Status: AC
  Administered 2019-11-19 – 2019-11-21 (×7): 5 mg via ORAL
  Filled 2019-11-19 (×7): qty 1

## 2019-11-19 MED ORDER — POTASSIUM CHLORIDE CRYS ER 20 MEQ PO TBCR
40.0000 meq | EXTENDED_RELEASE_TABLET | ORAL | Status: AC
  Administered 2019-11-19 (×2): 40 meq via ORAL
  Filled 2019-11-19 (×2): qty 2

## 2019-11-19 MED ORDER — METOCLOPRAMIDE HCL 5 MG/ML IJ SOLN
10.0000 mg | Freq: Once | INTRAMUSCULAR | Status: AC
  Administered 2019-11-20: 10 mg via INTRAVENOUS
  Filled 2019-11-19: qty 2

## 2019-11-19 MED ORDER — INSULIN GLARGINE 100 UNIT/ML ~~LOC~~ SOLN
8.0000 [IU] | SUBCUTANEOUS | Status: DC
  Administered 2019-11-19 – 2019-11-26 (×8): 8 [IU] via SUBCUTANEOUS
  Filled 2019-11-19 (×9): qty 0.08

## 2019-11-19 MED ORDER — PEG-KCL-NACL-NASULF-NA ASC-C 100 G PO SOLR: 1.0000 | Freq: Once | ORAL | Status: DC

## 2019-11-19 MED ORDER — PEG-KCL-NACL-NASULF-NA ASC-C 100 G PO SOLR
0.5000 | Freq: Once | ORAL | Status: AC
  Administered 2019-11-20: 100 g via ORAL

## 2019-11-19 MED ORDER — INSULIN ASPART 100 UNIT/ML ~~LOC~~ SOLN
2.0000 [IU] | Freq: Three times a day (TID) | SUBCUTANEOUS | Status: DC
  Administered 2019-11-19 – 2019-11-28 (×12): 2 [IU] via SUBCUTANEOUS

## 2019-11-19 MED ORDER — METOCLOPRAMIDE HCL 5 MG/ML IJ SOLN
10.0000 mg | Freq: Once | INTRAMUSCULAR | Status: AC
  Administered 2019-11-19: 10 mg via INTRAVENOUS
  Filled 2019-11-19: qty 2

## 2019-11-19 MED ORDER — PEG-KCL-NACL-NASULF-NA ASC-C 100 G PO SOLR
0.5000 | Freq: Once | ORAL | Status: AC
  Administered 2019-11-19: 100 g via ORAL
  Filled 2019-11-19: qty 1

## 2019-11-19 MED ORDER — INSULIN ASPART 100 UNIT/ML ~~LOC~~ SOLN
0.0000 [IU] | Freq: Three times a day (TID) | SUBCUTANEOUS | Status: DC
  Administered 2019-11-19: 3 [IU] via SUBCUTANEOUS
  Administered 2019-11-19: 5 [IU] via SUBCUTANEOUS
  Administered 2019-11-19 – 2019-11-20 (×3): 3 [IU] via SUBCUTANEOUS
  Administered 2019-11-20: 2 [IU] via SUBCUTANEOUS
  Administered 2019-11-20 – 2019-11-21 (×3): 3 [IU] via SUBCUTANEOUS
  Administered 2019-11-21: 2 [IU] via SUBCUTANEOUS
  Administered 2019-11-21 – 2019-11-22 (×3): 3 [IU] via SUBCUTANEOUS
  Administered 2019-11-22: 2 [IU] via SUBCUTANEOUS
  Administered 2019-11-22: 3 [IU] via SUBCUTANEOUS
  Administered 2019-11-23: 2 [IU] via SUBCUTANEOUS
  Administered 2019-11-23: 5 [IU] via SUBCUTANEOUS
  Administered 2019-11-24: 2 [IU] via SUBCUTANEOUS
  Administered 2019-11-24: 3 [IU] via SUBCUTANEOUS
  Administered 2019-11-24: 5 [IU] via SUBCUTANEOUS
  Administered 2019-11-25 (×2): 2 [IU] via SUBCUTANEOUS
  Administered 2019-11-25: 5 [IU] via SUBCUTANEOUS
  Administered 2019-11-25: 3 [IU] via SUBCUTANEOUS
  Administered 2019-11-26: 5 [IU] via SUBCUTANEOUS
  Administered 2019-11-26: 3 [IU] via SUBCUTANEOUS
  Administered 2019-11-26 (×2): 5 [IU] via SUBCUTANEOUS
  Administered 2019-11-27 (×4): 3 [IU] via SUBCUTANEOUS
  Administered 2019-11-28: 8 [IU] via SUBCUTANEOUS
  Administered 2019-11-28: 5 [IU] via SUBCUTANEOUS

## 2019-11-19 NOTE — Progress Notes (Signed)
PROGRESS NOTE    Nancy Hawkins  URK:[id number] DOB: Aug 27, 1940 DOA: 11/17/2019 PCP: Lilian Coma., MD   Brief Narrative:  Nancy Hawkins is a 79 y.o. female with medical history significant of HTN and HLD who presents for worsening intermittent abdominal and back pain. She states she has had intermittent abdominal pain and low back pain for about 1 months. She has seen her primary care doctor a couple of times since the pain began. At one point she was diagnosed with diabetes and prescribed Metformin (which she did not tolerate and stopped taking), she was thought to have GERD and given a trial of PPI, which has not helped her symptoms. The pain has remained intermittent, but has progressively worsened to a 10/10 last night, prompting her to visit urgent care leading to a subsequent transfer to the ED. she does not report specific aggravating aggravating or alleviating factors.  Pain at times feels like it is radiating around both sides to her back. she has intermittent associated nausea without vomiting but does not necessarily correlate with her intermittent pain.  She also reports a degree of constipation for the last several days.  She denies vomiting, diarrhea, chest pain, shortness of breath.  She has never had a colonoscopy. In ED: Lab work significant for glucose of 300, bicarb 11 with gap of 17.  Ketones present in the urine and beta hydroxybutyrate level of elevated at 5.38.  Lipase normal at 34.  VBG showed pH 7.2 and PCO2 of 27 consistent with metabolic acidosis with respiratory compensation.  CT abdomen pelvis showed inflammation of the mid body the pancreas with possibility of mass in the mid body of the pancreas with recommendation for dedicated pre and post contrast-enhanced MRI.  Also noted on CT were focal narrowing of the ascending colon and age-indeterminate compression of superior L1 and L3 vertebral bodies.   Assessment & Plan:   DKA likely provoked by acute pancreatitis as below,  POA, improving -Diet advanced per GI to clears, off insulin drip and D5 -Continue IV fluids with LR at this point -Currently on sliding scale and long-acting insulin, continue hypoglycemic protocol, high risk for hypoglycemia given current p.o. status.  Pancreatic inflammation Pancreatic Mass - GI following - MRI/MRCP/EUS/Colonoscopy - Lipase normal - Check CA 19-9 level elevated at 228 - Hold any anticoagulation - Diet per GI, currently on clears suspect n.p.o. periprocedure.  HTN - Continue home ARB and Dilt  Focal narrowing of ascending colon - Radiologist read notes this may be due to peristalsis, however patient has never had a colonoscopy - GI consulted as above, may benefit from colonoscopy   DVT prophylaxis:      SCDs Code Status:              Full Family Communication: None present  Status is: INPT  Dispo: The patient is from: Home              Anticipated d/c is to: TBD -likely home              Anticipated d/c date is: TBD - likely 72-96h pending clinical course              Patient currently NOT  medically stable for discharge  Consultants:   GI  Procedures:   Upper EUS and colonoscopy tentatively planned 11/20/2019  Antimicrobials:  None indicated   Subjective: Pain well controlled, and started with nausea but denies vomiting, constipation, headache, fevers, chills.  Objective: Vitals:   11/18/19  2341 11/19/19 0235 11/19/19 0300 11/19/19 0331  BP: (!) 110/46   (!) 103/52  Pulse: 71 76 69   Resp: 16 (!) 21 10   Temp: 98.8 F (37.1 C)   98.6 F (37 C)  TempSrc: Oral   Oral  SpO2: 96% 95% 94%   Weight:      Height:        Intake/Output Summary (Last 24 hours) at 11/19/2019 0730 Last data filed at 11/19/2019 0200 Gross per 24 hour  Intake 290 ml  Output 450 ml  Net -160 ml   Filed Weights   11/17/19 1003 11/18/19 0025 11/18/19 2047  Weight: 65.3 kg 63 kg 66 kg    Examination:  General exam: Appears calm and comfortable    Respiratory system: Clear to auscultation. Respiratory effort normal. Cardiovascular system: S1 & S2 heard, RRR. No JVD, murmurs, rubs, gallops or clicks. No pedal edema. Gastrointestinal system: Abdomen is nondistended, soft and moderately tender at epigastrium. Normal bowel sounds heard. Central nervous system: Alert and oriented. No focal neurological deficits. Extremities: Symmetric 5 x 5 power. Skin: No rashes, lesions or ulcers Psychiatry: Judgement and insight appear normal. Mood & affect appropriate.     Data Reviewed: I have personally reviewed following labs and imaging studies  CBC: Recent Labs  Lab 11/17/19 1013 11/17/19 1523  WBC 15.2*  --   HGB 12.3 13.6  HCT 39.1 40.0  MCV 90.5  --   PLT 287  --    Basic Metabolic Panel: Recent Labs  Lab 11/18/19 0045 11/18/19 0237 11/18/19 0932 11/18/19 1310 11/19/19 0052  NA 133* 136 135 136 135  K 3.4* 3.1* 2.8* 3.0* 2.5*  CL 109 110 109 108 105  CO2 16* 18* 16* 17* 19*  GLUCOSE 167* 123* 123* 136* 127*  BUN 8 8 8  7* 7*  CREATININE 0.49 0.45 0.50 0.54 0.46  CALCIUM 8.2* 8.4* 8.4* 9.1 8.6*  MG  --   --   --   --  1.5*   GFR: Estimated Creatinine Clearance: 53.3 mL/min (by C-G formula based on SCr of 0.46 mg/dL). Liver Function Tests: Recent Labs  Lab 11/17/19 1013  AST 9*  ALT 16  ALKPHOS 111  BILITOT 0.6  PROT 7.0  ALBUMIN 2.9*   Recent Labs  Lab 11/17/19 1013  LIPASE 34   No results for input(s): AMMONIA in the last 168 hours. Coagulation Profile: No results for input(s): INR, PROTIME in the last 168 hours. Cardiac Enzymes: No results for input(s): CKTOTAL, CKMB, CKMBINDEX, TROPONINI in the last 168 hours. BNP (last 3 results) No results for input(s): PROBNP in the last 8760 hours. HbA1C: Recent Labs    11/17/19 2027  HGBA1C 11.0*   CBG: Recent Labs  Lab 11/18/19 2315 11/19/19 0016 11/19/19 0125 11/19/19 0230 11/19/19 0345  GLUCAP 125* 129* 145* 148* 117*   Lipid Profile: No  results for input(s): CHOL, HDL, LDLCALC, TRIG, CHOLHDL, LDLDIRECT in the last 72 hours. Thyroid Function Tests: Recent Labs    11/17/19 1745  TSH 0.539   Anemia Panel: No results for input(s): VITAMINB12, FOLATE, FERRITIN, TIBC, IRON, RETICCTPCT in the last 72 hours. Sepsis Labs: No results for input(s): PROCALCITON, LATICACIDVEN in the last 168 hours.  Recent Results (from the past 240 hour(s))  Respiratory Panel by RT PCR (Flu A&B, Covid) - Nasopharyngeal Swab     Status: None   Collection Time: 11/17/19  5:35 PM   Specimen: Nasopharyngeal Swab  Result Value Ref Range Status  SARS Coronavirus 2 by RT PCR NEGATIVE NEGATIVE Final    Comment: (NOTE) SARS-CoV-2 target nucleic acids are NOT DETECTED.  The SARS-CoV-2 RNA is generally detectable in upper respiratoy specimens during the acute phase of infection. The lowest concentration of SARS-CoV-2 viral copies this assay can detect is 131 copies/mL. A negative result does not preclude SARS-Cov-2 infection and should not be used as the sole basis for treatment or other patient management decisions. A negative result may occur with  improper specimen collection/handling, submission of specimen other than nasopharyngeal swab, presence of viral mutation(s) within the areas targeted by this assay, and inadequate number of viral copies (<131 copies/mL). A negative result must be combined with clinical observations, patient history, and epidemiological information. The expected result is Negative.  Fact Sheet for Patients:  PinkCheek.be  Fact Sheet for Healthcare Providers:  GravelBags.it  This test is no t yet approved or cleared by the [place] FDA and  has been authorized for detection and/or diagnosis of SARS-CoV-2 by FDA under an Emergency Use Authorization (EUA). This EUA will remain  in effect (meaning this test can be used) for the duration of the COVID-19  declaration under Section 564(b)(1) of the Act, 21 U.S.C. section 360bbb-3(b)(1), unless the authorization is terminated or revoked sooner.     Influenza A by PCR NEGATIVE NEGATIVE Final   Influenza B by PCR NEGATIVE NEGATIVE Final    Comment: (NOTE) The Xpert Xpress SARS-CoV-2/FLU/RSV assay is intended as an aid in  the diagnosis of influenza from Nasopharyngeal swab specimens and  should not be used as a sole basis for treatment. Nasal washings and  aspirates are unacceptable for Xpert Xpress SARS-CoV-2/FLU/RSV  testing.  Fact Sheet for Patients: PinkCheek.be  Fact Sheet for Healthcare Providers: GravelBags.it  This test is not yet approved or cleared by the [place] FDA and  has been authorized for detection and/or diagnosis of SARS-CoV-2 by  FDA under an Emergency Use Authorization (EUA). This EUA will remain  in effect (meaning this test can be used) for the duration of the  Covid-19 declaration under Section 564(b)(1) of the Act, 21  U.S.C. section 360bbb-3(b)(1), unless the authorization is  terminated or revoked. Performed at [facility], [place] [address] [place], [place] [address]   MRSA PCR Screening     Status: None   Collection Time: 11/18/19  8:56 PM   Specimen: Nasal Mucosa; Nasopharyngeal  Result Value Ref Range Status   MRSA by PCR NEGATIVE NEGATIVE Final    Comment:        The GeneXpert MRSA Assay (FDA approved for NASAL specimens only), is one component of a comprehensive MRSA colonization surveillance program. It is not intended to diagnose MRSA infection nor to guide or monitor treatment for MRSA infections. Performed at [facility], [place] [address], [place], [place] [address]          Radiology Studies: CT ABDOMEN PELVIS W CONTRAST  Result Date: 11/17/2019 CLINICAL DATA:  Patient with generalized abdominal pain and nausea. EXAM: CT ABDOMEN AND PELVIS WITH CONTRAST  TECHNIQUE: Multidetector CT imaging of the abdomen and pelvis was performed using the standard protocol following bolus administration of intravenous contrast. CONTRAST:  188mL OMNIPAQUE IOHEXOL 300 MG/ML  SOLN COMPARISON:  None. FINDINGS: Lower chest: Normal heart size. Lung bases are clear. No pleural effusion. Hepatobiliary: Liver is normal in size and contour. Prior cholecystectomy. No intrahepatic or extrahepatic biliary ductal dilatation. Pancreas: There is suggestion of inflammation involving the mid body  of the pancreas with peripancreatic fat stranding. Additionally, within the mid body of the pancreas there is a possible 2.5 x 2.2 cm low-attenuation mass. This appears to abut the celiac axis as well as the splenic vein. The splenic vein is narrowed (image 25; series 3). There is a small amount of fat stranding about the pancreas. Spleen: Unremarkable Adrenals/Urinary Tract: Normal adrenal glands. Kidneys enhance symmetrically with contrast. Bilateral subcentimeter too small to characterize low-attenuation renal lesions. Partially exophytic cyst mid pole left kidney. No hydronephrosis. Urinary bladder is unremarkable. Stomach/Bowel: Descending and sigmoid colonic diverticulosis. No CT evidence for acute diverticulitis. There is focal narrowing of the ascending colon (image 46; series 3). Normal morphology of the stomach. No evidence for small bowel obstruction. No free fluid or free intraperitoneal air. Vascular/Lymphatic: Normal caliber abdominal aorta. Peripheral calcified atherosclerotic plaque. Retroaortic left renal vein. No retroperitoneal adenopathy. Reproductive: Prior hysterectomy. Other: None. Musculoskeletal: Lower thoracic and lumbar spine degenerative changes. Age-indeterminate compression deformity of the superior L1 endplate and superior L3 endplate. Addition there is age-indeterminate height loss of the T11 vertebral body. IMPRESSION: 1. There is inflammation involving the mid body the  pancreas which may be secondary to pancreatitis. Additionally, findings are concerning for the possibility of a mass within the mid body of the pancreas. Given the overall findings, recommend dedicated evaluation of the pancreas with pre and post contrast-enhanced abdominal MRI. 2. There is focal narrowing of the ascending colon. While this may be secondary to peristalsis, recommend correlation with the patient's colon cancer screening history. If screening is not up-to-date, appropriate screening should be considered. 3. Age-indeterminate compression deformities of the superior L1 and L3 vertebral bodies. Recommend correlation with point tenderness. 4. Sigmoid colonic diverticulosis without CT evidence for acute diverticulitis. Electronically Signed   By: Lovey Newcomer M.D.   On: 11/17/2019 17:42   MR ABDOMEN MRCP W WO CONTAST  Result Date: 11/18/2019 CLINICAL DATA:  Pancreatic mass EXAM: MRI ABDOMEN WITHOUT AND WITH CONTRAST (INCLUDING MRCP) TECHNIQUE: Multiplanar multisequence MR imaging of the abdomen was performed both before and after the administration of intravenous contrast. Heavily T2-weighted images of the biliary and pancreatic ducts were obtained, and three-dimensional MRCP images were rendered by post processing. CONTRAST:  110mL GADAVIST GADOBUTROL 1 MMOL/ML IV SOLN COMPARISON:  CT abdomen pelvis, 11/17/2019 FINDINGS: Lower chest: Trace bilateral pleural effusions and associated atelectasis or consolidation. Hepatobiliary: No mass or other parenchymal abnormality identified. Status post cholecystectomy. No biliary ductal dilatation. Pancreas: There is extensive inflammatory fat stranding about the pancreas, with a thinly septated multi cystic fluid signal lesion of the anterior pancreatic neck measuring 3.2 x 1.7 cm, without evidence of solid component or contrast enhancement (series 5, image 21). There are numerous additional small fluid signal lesions throughout the pancreatic parenchyma, the  remaining lesions subcentimeter. The central portion of the pancreatic duct is dilated, measuring up to 8 mm (series 24, image 51). Spleen:  Within normal limits in size and appearance. Adrenals/Urinary Tract: No masses identified. No evidence of hydronephrosis. Stomach/Bowel: Visualized portions within the abdomen are unremarkable. Vascular/Lymphatic: No pathologically enlarged lymph nodes identified. No abdominal aortic aneurysm demonstrated. The portal vein appears somewhat compressed posterior to the pancreatic head, perhaps due to edema in this vicinity, but remains patent. Other:  Trace perihepatic ascites. Musculoskeletal: No suspicious bone lesions identified. IMPRESSION: 1. There is extensive inflammatory fat stranding about the pancreas, with a thinly septated multi cystic fluid signal lesion of the anterior pancreatic neck measuring 3.2 x 1.7 cm, without evidence of  solid component or contrast enhancement. 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 2. There are numerous additional small fluid signal lesions throughout the pancreatic parenchyma, the remaining lesions subcentimeter. These likely reflect additional pseudocysts and or small IPMNs. Consider follow-up MRI at 6-12 months to assess for stability. 3. The central portion of the pancreatic duct is dilated, measuring up to 8 mm, without obvious obstructing lesion identified to the ampulla. 4. Trace ascites. 5. Trace bilateral pleural effusions and associated atelectasis or consolidation. Electronically Signed   By: Eddie Candle M.D.   On: 11/18/2019 20:16    Scheduled Meds: . bisacodyl  20 mg Oral Once  . diltiazem  120 mg Oral Daily  . insulin aspart  0-15 Units Subcutaneous TID AC & HS  . insulin aspart  2 Units Subcutaneous TID WC  . insulin glargine  8 Units Subcutaneous Q24H  . irbesartan  75 mg Oral Daily   Continuous Infusions: . lactated ringers Stopped (11/18/19 1649)     LOS: 2 days   Time spent:  55min  Lizzette Carbonell C Lindwood Mogel, DO [facility]  If 7PM-7AM, please contact night-coverage [email or web address]  11/19/2019, 7:30 AM

## 2019-11-19 NOTE — Progress Notes (Addendum)
HOSPITAL MEDICINE OVERNIGHT EVENT NOTE    Notified by nursing patient has been tolerating clear liquid diet this evening.  Nursing is inquiring as to whether or not patient can be transitioned off Endo tool.    Chart reviewed, daytime provider note states that if patient is tolerating oral intake Endo tool can be discontinued.  I have personally verified gap is closed and beta-hydroxybutyrate level normalized.  Patient not on scheduled basal insulin at home -since patient is insulin nave patient would typically be placed on a total of 0.3 units/kg every 24 hours however patient has been transitioned to a clear liquid diet and due to pancreatitis with pseudocyst oral intake is tenuous.  We will therefore place patient on a reduced regimen of 8 units of Lantus nightly with 2 units of lispro before every meal with meals.  We will additionally transition patient to sliding scale coverage before every meal and nightly.   Continue on clear liquid diet as ordered on day shift.  Insulin infusion to be stopped 2 hrs after administration of basal insulin administration.      Vernelle Emerald  MD [facility]    ADDENDUM (10/12 1:35am)  Potassium found to be 2.5, Magnesium found to be 1.5.  Ordering 2 grams Of IV magnesium as well as 86meq potassium chloride x 2 doses Q4hrs.   Sherryll Burger Tanveer Brammer

## 2019-11-19 NOTE — H&P (View-Only) (Signed)
Daily Rounding Note  11/19/2019, 11:44 AM  LOS: 2 days   SUBJECTIVE:   Chief complaint: back and abd pain.  Pancreatic pseudocyts, ? IPMNs   Back and mid to lower abd pain persist.  Not well controlled w Dilaudid 0.5 mg/6 hours.  No nausea or BM.  Tolerating clears  OBJECTIVE:         Vital signs in last 24 hours:    Temp:  [98.3 F (36.8 C)-98.8 F (37.1 C)] 98.5 F (36.9 C) (10/12 0859) Pulse Rate:  [68-88] 86 (10/12 0859) Resp:  [10-21] 16 (10/12 0859) BP: (103-137)/(46-90) 137/69 (10/12 0859) SpO2:  [94 %-98 %] 95 % (10/12 0859) Weight:  [66 kg] 66 kg (10/11 2047)   Filed Weights   11/17/19 1003 11/18/19 0025 11/18/19 2047  Weight: 65.3 kg 63 kg 66 kg   General: looks well.  Alert.   Heart: RRR Chest: clear bil.  No labored breathing Abdomen: soft, ND, active BS.  Tender in mid abdomen/peri-umbilical region.    Extremities: no CCE Neuro/Psych:  Alert.  Oriented x 3.  No tremor or gross weakness.    Intake/Output from previous day: 10/11 0701 - 10/12 0700 In: 290 [P.O.:240; IV Piggyback:50] Out: 450 [Urine:450]  Intake/Output this shift: No intake/output data recorded.  Lab Results: Recent Labs    11/17/19 1013 11/17/19 1523  WBC 15.2*  --   HGB 12.3 13.6  HCT 39.1 40.0  PLT 287  --    BMET Recent Labs    11/18/19 0932 11/18/19 1310 11/19/19 0052  NA 135 136 135  K 2.8* 3.0* 2.5*  CL 109 108 105  CO2 16* 17* 19*  GLUCOSE 123* 136* 127*  BUN 8 7* 7*  CREATININE 0.50 0.54 0.46  CALCIUM 8.4* 9.1 8.6*   LFT Recent Labs    11/17/19 1013  PROT 7.0  ALBUMIN 2.9*  AST 9*  ALT 16  ALKPHOS 111  BILITOT 0.6   PT/INR No results for input(s): LABPROT, INR in the last 72 hours. Hepatitis Panel No results for input(s): HEPBSAG, HCVAB, HEPAIGM, HEPBIGM in the last 72 hours.  Studies/Results: CT ABDOMEN PELVIS W CONTRAST  Result Date: 11/17/2019 FINDINGS: Lower chest: Normal heart  size. Lung bases are clear. No pleural effusion. Hepatobiliary: Liver is normal in size and contour. Prior cholecystectomy. No intrahepatic or extrahepatic biliary ductal dilatation. Pancreas: There is suggestion of inflammation involving the mid body of the pancreas with peripancreatic fat stranding. Additionally, within the mid body of the pancreas there is a possible 2.5 x 2.2 cm low-attenuation mass. This appears to abut the celiac axis as well as the splenic vein. The splenic vein is narrowed (image 25; series 3). There is a small amount of fat stranding about the pancreas. Spleen: Unremarkable Adrenals/Urinary Tract: Normal adrenal glands. Kidneys enhance symmetrically with contrast. Bilateral subcentimeter too small to characterize low-attenuation renal lesions. Partially exophytic cyst mid pole left kidney. No hydronephrosis. Urinary bladder is unremarkable. Stomach/Bowel: Descending and sigmoid colonic diverticulosis. No CT evidence for acute diverticulitis. There is focal narrowing of the ascending colon (image 46; series 3). Normal morphology of the stomach. No evidence for small bowel obstruction. No free fluid or free intraperitoneal air. Vascular/Lymphatic: Normal caliber abdominal aorta. Peripheral calcified atherosclerotic plaque. Retroaortic left renal vein. No retroperitoneal adenopathy. Reproductive: Prior hysterectomy. Other: None. Musculoskeletal: Lower thoracic and lumbar spine degenerative changes. Age-indeterminate compression deformity of the superior L1 endplate and superior L3 endplate. Addition there is  age-indeterminate height loss of the T11 vertebral body. IMPRESSION: 1. There is inflammation involving the mid body the pancreas which may be secondary to pancreatitis. Additionally, findings are concerning for the possibility of a mass within the mid body of the pancreas. Given the overall findings, recommend dedicated evaluation of the pancreas with pre and post contrast-enhanced  abdominal MRI. 2. There is focal narrowing of the ascending colon. While this may be secondary to peristalsis, recommend correlation with the patient's colon cancer screening history. If screening is not up-to-date, appropriate screening should be considered. 3. Age-indeterminate compression deformities of the superior L1 and L3 vertebral bodies. Recommend correlation with point tenderness. 4. Sigmoid colonic diverticulosis without CT evidence for acute diverticulitis. Electronically Signed   By: Lovey Newcomer M.D.   On: 11/17/2019 17:42   MR ABDOMEN MRCP W WO CONTAST  Result Date: 11/18/2019 FINDINGS: Lower chest: Trace bilateral pleural effusions and associated atelectasis or consolidation. Hepatobiliary: No mass or other parenchymal abnormality identified. Status post cholecystectomy. No biliary ductal dilatation. Pancreas: There is extensive inflammatory fat stranding about the pancreas, with a thinly septated multi cystic fluid signal lesion of the anterior pancreatic neck measuring 3.2 x 1.7 cm, without evidence of solid component or contrast enhancement (series 5, image 21). There are numerous additional small fluid signal lesions throughout the pancreatic parenchyma, the remaining lesions subcentimeter. The central portion of the pancreatic duct is dilated, measuring up to 8 mm (series 24, image 51). Spleen:  Within normal limits in size and appearance. Adrenals/Urinary Tract: No masses identified. No evidence of hydronephrosis. Stomach/Bowel: Visualized portions within the abdomen are unremarkable. Vascular/Lymphatic: No pathologically enlarged lymph nodes identified. No abdominal aortic aneurysm demonstrated. The portal vein appears somewhat compressed posterior to the pancreatic head, perhaps due to edema in this vicinity, but remains patent. Other:  Trace perihepatic ascites. Musculoskeletal: No suspicious bone lesions identified. IMPRESSION: 1. There is extensive inflammatory fat stranding about the  pancreas, with a thinly septated multi cystic fluid signal lesion of the anterior pancreatic neck measuring 3.2 x 1.7 cm, without evidence of solid component or contrast enhancement. 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 2. There are numerous additional small fluid signal lesions throughout the pancreatic parenchyma, the remaining lesions subcentimeter. These likely reflect additional pseudocysts and or small IPMNs. Consider follow-up MRI at 6-12 months to assess for stability. 3. The central portion of the pancreatic duct is dilated, measuring up to 8 mm, without obvious obstructing lesion identified to the ampulla. 4. Trace ascites. 5. Trace bilateral pleural effusions and associated atelectasis or consolidation. Electronically Signed   By: Eddie Candle M.D.   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 Pseudocysts vs IPMNs.  PD dilated centrally.  No malignant lesions.  Lipase and LFTs normal.  CA 19-9 is 228.    *   Narrowing at ascending colon per CT.  Benign colonoscopy > 10 yr ago.  *    IDDM   PLAN   *    Upper EUS and colonoscopy tmrw at 2 PM w Dr Rush Landmark.  See prep orders, split between tonight and early tmrw AM  *   Added scheduled oxycodone 5 mg q 6 hours for 48 hours, leaving Dilaudid in place.      Azucena Freed  11/19/2019, 11:44 AM Phone [phone]

## 2019-11-19 NOTE — Progress Notes (Signed)
Daily Rounding Note  11/19/2019, 11:44 AM  LOS: 2 days   SUBJECTIVE:   Chief complaint: back and abd pain.  Pancreatic pseudocyts, ? IPMNs   Back and mid to lower abd pain persist.  Not well controlled w Dilaudid 0.5 mg/6 hours.  No nausea or BM.  Tolerating clears  OBJECTIVE:         Vital signs in last 24 hours:    Temp:  [98.3 F (36.8 C)-98.8 F (37.1 C)] 98.5 F (36.9 C) (10/12 0859) Pulse Rate:  [68-88] 86 (10/12 0859) Resp:  [10-21] 16 (10/12 0859) BP: (103-137)/(46-90) 137/69 (10/12 0859) SpO2:  [94 %-98 %] 95 % (10/12 0859) Weight:  [66 kg] 66 kg (10/11 2047)   Filed Weights   11/17/19 1003 11/18/19 0025 11/18/19 2047  Weight: 65.3 kg 63 kg 66 kg   General: looks well.  Alert.   Heart: RRR Chest: clear bil.  No labored breathing Abdomen: soft, ND, active BS.  Tender in mid abdomen/peri-umbilical region.    Extremities: no CCE Neuro/Psych:  Alert.  Oriented x 3.  No tremor or gross weakness.    Intake/Output from previous day: 10/11 0701 - 10/12 0700 In: 290 [P.O.:240; IV Piggyback:50] Out: 450 [Urine:450]  Intake/Output this shift: No intake/output data recorded.  Lab Results: Recent Labs    11/17/19 1013 11/17/19 1523  WBC 15.2*  --   HGB 12.3 13.6  HCT 39.1 40.0  PLT 287  --    BMET Recent Labs    11/18/19 0932 11/18/19 1310 11/19/19 0052  NA 135 136 135  K 2.8* 3.0* 2.5*  CL 109 108 105  CO2 16* 17* 19*  GLUCOSE 123* 136* 127*  BUN 8 7* 7*  CREATININE 0.50 0.54 0.46  CALCIUM 8.4* 9.1 8.6*   LFT Recent Labs    11/17/19 1013  PROT 7.0  ALBUMIN 2.9*  AST 9*  ALT 16  ALKPHOS 111  BILITOT 0.6   PT/INR No results for input(s): LABPROT, INR in the last 72 hours. Hepatitis Panel No results for input(s): HEPBSAG, HCVAB, HEPAIGM, HEPBIGM in the last 72 hours.  Studies/Results: CT ABDOMEN PELVIS W CONTRAST  Result Date: 11/17/2019 FINDINGS: Lower chest: Normal heart  size. Lung bases are clear. No pleural effusion. Hepatobiliary: Liver is normal in size and contour. Prior cholecystectomy. No intrahepatic or extrahepatic biliary ductal dilatation. Pancreas: There is suggestion of inflammation involving the mid body of the pancreas with peripancreatic fat stranding. Additionally, within the mid body of the pancreas there is a possible 2.5 x 2.2 cm low-attenuation mass. This appears to abut the celiac axis as well as the splenic vein. The splenic vein is narrowed (image 25; series 3). There is a small amount of fat stranding about the pancreas. Spleen: Unremarkable Adrenals/Urinary Tract: Normal adrenal glands. Kidneys enhance symmetrically with contrast. Bilateral subcentimeter too small to characterize low-attenuation renal lesions. Partially exophytic cyst mid pole left kidney. No hydronephrosis. Urinary bladder is unremarkable. Stomach/Bowel: Descending and sigmoid colonic diverticulosis. No CT evidence for acute diverticulitis. There is focal narrowing of the ascending colon (image 46; series 3). Normal morphology of the stomach. No evidence for small bowel obstruction. No free fluid or free intraperitoneal air. Vascular/Lymphatic: Normal caliber abdominal aorta. Peripheral calcified atherosclerotic plaque. Retroaortic left renal vein. No retroperitoneal adenopathy. Reproductive: Prior hysterectomy. Other: None. Musculoskeletal: Lower thoracic and lumbar spine degenerative changes. Age-indeterminate compression deformity of the superior L1 endplate and superior L3 endplate. Addition there is  age-indeterminate height loss of the T11 vertebral body. IMPRESSION: 1. There is inflammation involving the mid body the pancreas which may be secondary to pancreatitis. Additionally, findings are concerning for the possibility of a mass within the mid body of the pancreas. Given the overall findings, recommend dedicated evaluation of the pancreas with pre and post contrast-enhanced  abdominal MRI. 2. There is focal narrowing of the ascending colon. While this may be secondary to peristalsis, recommend correlation with the patient's colon cancer screening history. If screening is not up-to-date, appropriate screening should be considered. 3. Age-indeterminate compression deformities of the superior L1 and L3 vertebral bodies. Recommend correlation with point tenderness. 4. Sigmoid colonic diverticulosis without CT evidence for acute diverticulitis. Electronically Signed   By: Lovey Newcomer M.D.   On: 11/17/2019 17:42   MR ABDOMEN MRCP W WO CONTAST  Result Date: 11/18/2019 FINDINGS: Lower chest: Trace bilateral pleural effusions and associated atelectasis or consolidation. Hepatobiliary: No mass or other parenchymal abnormality identified. Status post cholecystectomy. No biliary ductal dilatation. Pancreas: There is extensive inflammatory fat stranding about the pancreas, with a thinly septated multi cystic fluid signal lesion of the anterior pancreatic neck measuring 3.2 x 1.7 cm, without evidence of solid component or contrast enhancement (series 5, image 21). There are numerous additional small fluid signal lesions throughout the pancreatic parenchyma, the remaining lesions subcentimeter. The central portion of the pancreatic duct is dilated, measuring up to 8 mm (series 24, image 51). Spleen:  Within normal limits in size and appearance. Adrenals/Urinary Tract: No masses identified. No evidence of hydronephrosis. Stomach/Bowel: Visualized portions within the abdomen are unremarkable. Vascular/Lymphatic: No pathologically enlarged lymph nodes identified. No abdominal aortic aneurysm demonstrated. The portal vein appears somewhat compressed posterior to the pancreatic head, perhaps due to edema in this vicinity, but remains patent. Other:  Trace perihepatic ascites. Musculoskeletal: No suspicious bone lesions identified. IMPRESSION: 1. There is extensive inflammatory fat stranding about the  pancreas, with a thinly septated multi cystic fluid signal lesion of the anterior pancreatic neck measuring 3.2 x 1.7 cm, without evidence of solid component or contrast enhancement. 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 2. There are numerous additional small fluid signal lesions throughout the pancreatic parenchyma, the remaining lesions subcentimeter. These likely reflect additional pseudocysts and or small IPMNs. Consider follow-up MRI at 6-12 months to assess for stability. 3. The central portion of the pancreatic duct is dilated, measuring up to 8 mm, without obvious obstructing lesion identified to the ampulla. 4. Trace ascites. 5. Trace bilateral pleural effusions and associated atelectasis or consolidation. Electronically Signed   By: Eddie Candle M.D.   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 Pseudocysts vs IPMNs.  PD dilated centrally.  No malignant lesions.  Lipase and LFTs normal.  CA 19-9 is 228.    *   Narrowing at ascending colon per CT.  Benign colonoscopy > 10 yr ago.  *    IDDM   PLAN   *    Upper EUS and colonoscopy tmrw at 2 PM w Dr Rush Landmark.  See prep orders, split between tonight and early tmrw AM  *   Added scheduled oxycodone 5 mg q 6 hours for 48 hours, leaving Dilaudid in place.      Azucena Freed  11/19/2019, 11:44 AM Phone [phone]

## 2019-11-20 ENCOUNTER — Encounter (HOSPITAL_COMMUNITY)
Admission: EM | Disposition: A | Discharge status: Home / Self Care | Payer: Self-pay | Source: Ambulatory Visit | Attending: Internal Medicine

## 2019-11-20 ENCOUNTER — Encounter (HOSPITAL_COMMUNITY): Payer: Self-pay | Admitting: Internal Medicine

## 2019-11-20 ENCOUNTER — Inpatient Hospital Stay (HOSPITAL_COMMUNITY): Payer: Medicare Other | Admitting: Anesthesiology

## 2019-11-20 DIAGNOSIS — K922 Gastrointestinal hemorrhage, unspecified: Secondary | ICD-10-CM

## 2019-11-20 DIAGNOSIS — K5520 Angiodysplasia of colon without hemorrhage: Secondary | ICD-10-CM | POA: Diagnosis not present

## 2019-11-20 DIAGNOSIS — K862 Cyst of pancreas: Secondary | ICD-10-CM

## 2019-11-20 DIAGNOSIS — K635 Polyp of colon: Secondary | ICD-10-CM

## 2019-11-20 DIAGNOSIS — Z1211 Encounter for screening for malignant neoplasm of colon: Secondary | ICD-10-CM | POA: Diagnosis not present

## 2019-11-20 DIAGNOSIS — K317 Polyp of stomach and duodenum: Secondary | ICD-10-CM

## 2019-11-20 DIAGNOSIS — I10 Essential (primary) hypertension: Secondary | ICD-10-CM

## 2019-11-20 DIAGNOSIS — E0810 Diabetes mellitus due to underlying condition with ketoacidosis without coma: Secondary | ICD-10-CM | POA: Diagnosis not present

## 2019-11-20 DIAGNOSIS — K859 Acute pancreatitis without necrosis or infection, unspecified: Secondary | ICD-10-CM | POA: Diagnosis not present

## 2019-11-20 DIAGNOSIS — E876 Hypokalemia: Secondary | ICD-10-CM

## 2019-11-20 DIAGNOSIS — K8689 Other specified diseases of pancreas: Secondary | ICD-10-CM | POA: Diagnosis not present

## 2019-11-20 HISTORY — PX: UPPER ESOPHAGEAL ENDOSCOPIC ULTRASOUND (EUS): SHX6562

## 2019-11-20 HISTORY — PX: BIOPSY: SHX5522

## 2019-11-20 HISTORY — PX: COLONOSCOPY WITH PROPOFOL: SHX5780

## 2019-11-20 HISTORY — PX: SUBMUCOSAL LIFTING INJECTION: SHX6855

## 2019-11-20 HISTORY — PX: FINE NEEDLE ASPIRATION: SHX5430

## 2019-11-20 HISTORY — PX: HEMOSTASIS CLIP PLACEMENT: SHX6857

## 2019-11-20 HISTORY — PX: ESOPHAGOGASTRODUODENOSCOPY (EGD) WITH PROPOFOL: SHX5813

## 2019-11-20 HISTORY — PX: POLYPECTOMY: SHX5525

## 2019-11-20 HISTORY — PX: ENDOSCOPIC MUCOSAL RESECTION: SHX6839

## 2019-11-20 LAB — GLUCOSE, CAPILLARY
Glucose-Capillary: 219 mg/dL — ABNORMAL HIGH (ref 70–99)
Glucose-Capillary: 192 mg/dL — ABNORMAL HIGH (ref 70–99)
Glucose-Capillary: 180 mg/dL — ABNORMAL HIGH (ref 70–99)
Glucose-Capillary: 139 mg/dL — ABNORMAL HIGH (ref 70–99)

## 2019-11-20 LAB — POCT I-STAT, CHEM 8
BUN: 6 mg/dL — ABNORMAL LOW (ref 8–23)
Calcium, Ion: 1.09 mmol/L — ABNORMAL LOW (ref 1.15–1.40)
Chloride: 104 mmol/L (ref 98–111)
Creatinine, Ser: 0.3 mg/dL — ABNORMAL LOW (ref 0.44–1.00)
Glucose, Bld: 181 mg/dL — ABNORMAL HIGH (ref 70–99)
HCT: 33 % — ABNORMAL LOW (ref 36.0–46.0)
Hemoglobin: 11.2 g/dL — ABNORMAL LOW (ref 12.0–15.0)
Potassium: 2.8 mmol/L — ABNORMAL LOW (ref 3.5–5.1)
Sodium: 139 mmol/L (ref 135–145)
TCO2: 20 mmol/L — ABNORMAL LOW (ref 22–32)

## 2019-11-20 LAB — CBC
HCT: 31.6 % — ABNORMAL LOW (ref 36.0–46.0)
Hemoglobin: 10.7 g/dL — ABNORMAL LOW (ref 12.0–15.0)
MCH: 28.8 pg (ref 26.0–34.0)
MCHC: 33.9 g/dL (ref 30.0–36.0)
MCV: 84.9 fL (ref 80.0–100.0)
Platelets: 233 10*3/uL (ref 150–400)
RBC: 3.72 MIL/uL — ABNORMAL LOW (ref 3.87–5.11)
RDW: 14 % (ref 11.5–15.5)
WBC: 13.7 10*3/uL — ABNORMAL HIGH (ref 4.0–10.5)
nRBC: 0 % (ref 0.0–0.2)

## 2019-11-20 LAB — BASIC METABOLIC PANEL
Anion gap: 11 (ref 5–15)
BUN: 7 mg/dL — ABNORMAL LOW (ref 8–23)
CO2: 18 mmol/L — ABNORMAL LOW (ref 22–32)
Calcium: 8.1 mg/dL — ABNORMAL LOW (ref 8.9–10.3)
Chloride: 105 mmol/L (ref 98–111)
Creatinine, Ser: 0.51 mg/dL (ref 0.44–1.00)
GFR, Estimated: >60 mL/min (ref >60)
Glucose, Bld: 243 mg/dL — ABNORMAL HIGH (ref 70–99)
Potassium: 2.8 mmol/L — ABNORMAL LOW (ref 3.5–5.1)
Sodium: 134 mmol/L — ABNORMAL LOW (ref 135–145)

## 2019-11-20 LAB — CANCER ANTIGEN 19-9: CA 19-9: 189 U/mL — ABNORMAL HIGH (ref 0–35)

## 2019-11-20 LAB — MAGNESIUM: Magnesium: 1.5 mg/dL — ABNORMAL LOW (ref 1.7–2.4)

## 2019-11-20 SURGERY — COLONOSCOPY WITH PROPOFOL
Anesthesia: Monitor Anesthesia Care

## 2019-11-20 SURGERY — ESOPHAGOGASTRODUODENOSCOPY (EGD) WITH PROPOFOL
Anesthesia: Monitor Anesthesia Care

## 2019-11-20 MED ORDER — PROPOFOL 10 MG/ML IV BOLUS
INTRAVENOUS | Status: DC | PRN
  Administered 2019-11-20: 10 mg via INTRAVENOUS
  Administered 2019-11-20: 30 mg via INTRAVENOUS

## 2019-11-20 MED ORDER — PROPOFOL 500 MG/50ML IV EMUL
INTRAVENOUS | Status: DC | PRN
  Administered 2019-11-20: 75 ug/kg/min via INTRAVENOUS

## 2019-11-20 MED ORDER — CIPROFLOXACIN IN D5W 400 MG/200ML IV SOLN
INTRAVENOUS | Status: DC | PRN
  Administered 2019-11-20: 400 mg via INTRAVENOUS

## 2019-11-20 MED ORDER — MAGNESIUM SULFATE 2 GM/50ML IV SOLN
2.0000 g | Freq: Once | INTRAVENOUS | Status: AC
  Administered 2019-11-20: 2 g via INTRAVENOUS
  Filled 2019-11-20: qty 50

## 2019-11-20 MED ORDER — LIDOCAINE 2% (20 MG/ML) 5 ML SYRINGE
INTRAMUSCULAR | Status: DC | PRN
  Administered 2019-11-20: 60 mg via INTRAVENOUS

## 2019-11-20 MED ORDER — PANTOPRAZOLE SODIUM 40 MG PO TBEC
40.0000 mg | DELAYED_RELEASE_TABLET | Freq: Two times a day (BID) | ORAL | Status: DC
  Administered 2019-11-20 – 2019-11-22 (×4): 40 mg via ORAL
  Filled 2019-11-20 (×4): qty 1

## 2019-11-20 MED ORDER — CIPROFLOXACIN IN D5W 400 MG/200ML IV SOLN
INTRAVENOUS | Status: AC
  Filled 2019-11-20: qty 200

## 2019-11-20 MED ORDER — CIPROFLOXACIN IN D5W 400 MG/200ML IV SOLN
400.0000 mg | Freq: Two times a day (BID) | INTRAVENOUS | Status: DC
  Filled 2019-11-20: qty 200

## 2019-11-20 MED ORDER — POTASSIUM CHLORIDE 10 MEQ/100ML IV SOLN
10.0000 meq | INTRAVENOUS | Status: AC
  Administered 2019-11-20 (×3): 10 meq via INTRAVENOUS
  Filled 2019-11-20 (×3): qty 100

## 2019-11-20 SURGICAL SUPPLY — 22 items

## 2019-11-20 NOTE — Anesthesia Preprocedure Evaluation (Addendum)
Anesthesia Evaluation  Patient identified by MRN, date of birth, ID band Patient awake    Reviewed: Allergy & Precautions, NPO status , Patient's Chart, lab work & pertinent test results  History of Anesthesia Complications Negative for: history of anesthetic complications  Airway Mallampati: II  TM Distance: >3 FB Neck ROM: Full    Dental  (+) Partial Lower, Dental Advisory Given, Caps   Pulmonary neg pulmonary ROS,    Pulmonary exam normal        Cardiovascular hypertension, Pt. on medications negative cardio ROS Normal cardiovascular exam     Neuro/Psych negative neurological ROS     GI/Hepatic Neg liver ROS, GERD  ,  Endo/Other  diabetes  Renal/GU negative Renal ROS     Musculoskeletal negative musculoskeletal ROS (+)   Abdominal   Peds  Hematology negative hematology ROS (+)   Anesthesia Other Findings   Reproductive/Obstetrics                            Anesthesia Physical Anesthesia Plan  ASA: III  Anesthesia Plan: MAC   Post-op Pain Management:    Induction:   PONV Risk Score and Plan: 2 and Ondansetron and Propofol infusion  Airway Management Planned: Natural Airway  Additional Equipment:   Intra-op Plan:   Post-operative Plan:   Informed Consent: I have reviewed the patients History and Physical, chart, labs and discussed the procedure including the risks, benefits and alternatives for the proposed anesthesia with the patient or authorized representative who has indicated his/her understanding and acceptance.     Dental advisory given  Plan Discussed with: Anesthesiologist and CRNA  Anesthesia Plan Comments:        Anesthesia Quick Evaluation

## 2019-11-20 NOTE — Op Note (Addendum)
[facility] Patient Name: Nancy Hawkins Procedure Date : 11/20/2019 MRN: [id number] Attending MD: [name] , MD Date of Birth: Jan 22, 1941 CSN: [id number] Age: 79 Admit Type: Inpatient Procedure:                Upper EUS Indications:              Suspected mass in pancreas on CT scan, Pancreatic                            cyst on MRCP, Dilated pancreatic duct on MRCP,                            Suspected mass in pancreas on MRCP, Acute                            pancreatitis Providers:                [name], MD, Baird Cancer, RN, Ladona Ridgel, Technician, Dewitt Hoes, CRNA Referring MD:             Lajuan Lines. Hilarie Fredrickson, MD, Lilian Coma, [facility] Medicines:                Monitored Anesthesia Care, Cipro 073 mg IV Complications:            No immediate complications. Estimated Blood Loss:     Estimated blood loss was minimal. Procedure:                Pre-Anesthesia Assessment:                           - Prior to the procedure, a History and Physical                            was performed, and patient medications and                            allergies were reviewed. The patient's tolerance of                            previous anesthesia was also reviewed. The risks                            and benefits of the procedure and the sedation                            options and risks were discussed with the patient.                            All questions were answered, and informed consent  was obtained. Prior Anticoagulants: The patient has                            taken no previous anticoagulant or antiplatelet                            agents. ASA Grade Assessment: III - A patient with                            severe systemic disease. After reviewing the risks                            and benefits, the patient was deemed in                             satisfactory condition to undergo the procedure.                           After obtaining informed consent, the endoscope was                            passed under direct vision. Throughout the                            procedure, the patient's blood pressure, pulse, and                            oxygen saturations were monitored continuously. The                            [id number]-H190 ([id number]) Olympus gastroscope was                            introduced through the mouth, and advanced to the                            second part of duodenum. The TJF- Q180V ([id number])                            Olympus duodenoscope was introduced through the                            mouth, and advanced to the second part of duodenum.                            The [id number] ([id number]) Olympus Linear EUS scope                            was introduced through the mouth, and advanced to                            the duodenum for ultrasound examination from the  stomach and duodenum. The upper EUS was                            accomplished without difficulty. The patient                            tolerated the procedure. Scope In: Scope Out: Findings:      ENDOSCOPIC FINDING: :      A few white nummular lesions were noted in the entire esophagus.       Biopsies were taken with a cold forceps for histology to rule out       Candida.      The Z-line was regular and was found 38 cm from the incisors.      Hematin (altered blood/coffee-ground-like material) was found in the       entire examined stomach. Lavage of the area was performed using a large       amount, resulting in clearance with adequate visualization.      Segmental mild inflammation characterized by erosions, erythema and       granularity was found in the gastric body, at the incisura and in the       gastric antrum.      One 14 mm semi-sessile polyp with no bleeding and stigmata of recent       bleeding was  found on the posterior wall of the gastric antrum.       Preparations were made for mucosal resection. NBI Imaging and       White-Light endoscopy was done to demarcate the borders of the lesion.       Orise gel was injected to raise the lesion. Snare mucosal resection was       performed. Resection and retrieval were complete. To prevent bleeding       after mucosal resection, two hemostatic clips were successfully placed       (MR conditional). There was no bleeding during, or at the end, of the       procedure.      No other gross lesions were noted in the entire examined stomach.       Biopsies were taken with a cold forceps for histology and Helicobacter       pylori testing.      No gross lesions were noted in the duodenal bulb, in the first portion       of the duodenum and in the second portion of the duodenum. Biopsies were       taken with a cold forceps for histology.      The ampulla was normal hidden initially under a hood.      ENDOSONOGRAPHIC FINDING: :      An irregular lesion was identified in the region of the pancreatic body.       The lesion was hypoechoic and heterogenous with a possible small cystic       component. The lesion measured 27 mm by 19 mm in maximal cross-sectional       diameter. The endosonographic borders were poorly-defined. There was       sonographic evidence suggesting abutment of the splenic artery. The       remainder of the pancreas was examined. The endosonographic appearance       the immediate upstream pancreatic duct indicated duct dilation (PD up to       7.4 mm) and  then decompression back to a more normal size towards the       tail (<2 mm). The pancreatic duct in the head measured 2.8 mm. The       pancreatic duct in the neck measured 2.8 mm. The pancreatic parenchyma       had lobularity and concern for evidence of active/recent pancreatitis.       Due to the patient's presentation however, it was felt necessary to       proceed with a  fine needle biopsy, which was performed. Color Doppler       imaging was utilized prior to needle puncture to confirm a lack of       significant vascular structures within the needle path. Six passes were       made with the 22 gauge ultrasound core biopsy needle using a       transgastric approach. Visible cores of tissue were obtained.       Preliminary cytologic examination and touch preps were performed. Final       cytology results are pending.      An anechoic and septated lesion suggestive of a cyst was identified in       the pancreatic head. It is not in obvious communication with the       pancreatic duct. The lesion measured 20 mm by 7 mm in maximal       cross-sectional diameter. There were a few compartments thinly septated.       There was no associated mass. There was no internal debris within the       fluid-filled cavity.      There was no sign of significant endosonographic abnormality in the       common bile duct (4.7 mm) and in the common hepatic duct (8.7 mm). No       stones and no biliary sludge were identified.      Endosonographic imaging in the visualized portion of the liver showed no       mass.      No malignant-appearing lymph nodes were visualized in the celiac region       (level 20), peripancreatic region and porta hepatis region.      The celiac region was visualized. Impression:               EGD Impression:                           - White nummular lesions in esophageal mucosa.                            Biopsied for Candida.                           - Z-line regular, 38 cm from the incisors.                           - Gastritis. Hematin (altered                            blood/coffee-ground-like material) in the entire                            stomach. One gastric polyp, resected and retrieved  via mucosal resection and clips (MR conditional)                            were placed. No other gross lesions in the  stomach.                            Biopsied for HP.                           - No gross lesions in the duodenal bulb, in the                            first portion of the duodenum and in the second                            portion of the duodenum. Biopsied.                           - Normal ampulla.                           EUS Impression:                           - A lesion was identified in the pancreatic body.                            Cytology results are pending. However, the                            endosonographic appearance and clinical history is                            concerning for possible adenocarcinoma. This was                            staged T2 N0 Mx by endosonographic criteria. The                            staging applies if malignancy is confirmed. Fine                            needle biopsy performed. Other findings of the                            pancreas parenchyma and duct system are noted above.                           - A cystic lesion was seen in the pancreatic head.                            Tissue has not been obtained. However, the  endosonographic appearance is suggestive of a                            pancreatic pseudocyst.                           - There was no sign of significant pathology in the                            common bile duct and in the common hepatic duct.                           - No malignant-appearing lymph nodes were                            visualized in the celiac region (level 20),                            peripancreatic region and porta hepatis region. Recommendation:           - The patient will be observed post-procedure,                            until all discharge criteria are met.                           - Return patient to hospital ward for ongoing care.                           - Advance diet as tolerated.                           - Observe patient's clinical  course.                           - Await cytology results and await path results.                           - Protonix 40 mg twice daily x 1 month and then 40                            mg daily thereafter should be administered to heal                            the resection site and for the gastritis treatment.                           - Ciprofloxacin twice daily x 3-days total (I will                            give IV for next dose and then if tolerating orals                            then can be transitioned  to orals).                           - Hold Lovenox for at least 36 hours to decrease                            risk of post-FNB bleeding.                           - Repeat EGD may be considered based on final                            pathology of the gastric polyp.                           - The findings and recommendations were discussed                            with the patient.                           - The findings and recommendations were discussed                            with the referring physician. Procedure Code(s):        --- Professional ---                           [phone], Esophagogastroduodenoscopy, flexible,                            transoral; with endoscopic mucosal resection                           [phone], Esophagogastroduodenoscopy, flexible,                            transoral; with transendoscopic ultrasound-guided                            intramural or transmural fine needle                            aspiration/biopsy(s), (includes endoscopic                            ultrasound examination limited to the esophagus,                            stomach or duodenum, and adjacent structures) Diagnosis Code(s):        --- Professional ---                           K22.8, Other specified diseases of esophagus                           K29.70, Gastritis, unspecified, without bleeding  K92.2, Gastrointestinal  hemorrhage, unspecified                           K31.7, Polyp of stomach and duodenum                           K86.89, Other specified diseases of pancreas                           K86.2, Cyst of pancreas                           I89.9, Noninfective disorder of lymphatic vessels                            and lymph nodes, unspecified                           R93.3, Abnormal findings on diagnostic imaging of                            other parts of digestive tract                           K85.90, Acute pancreatitis without necrosis or                            infection, unspecified CPT copyright 2019 [employer]. All rights reserved. The codes documented in this report are preliminary and upon coder review may  be revised to meet current compliance requirements. [name], MD 11/20/2019 2:53:17 PM Number of Addenda: 0

## 2019-11-20 NOTE — Transfer of Care (Signed)
Immediate Anesthesia Transfer of Care Note  Patient: [name]  Procedure(s) Performed: UPPER ESOPHAGEAL ENDOSCOPIC ULTRASOUND (EUS) (N/A ) COLONOSCOPY WITH PROPOFOL (N/A )  Patient Location: PACU and Endoscopy Unit  Anesthesia Type:MAC  Level of Consciousness: drowsy  Airway & Oxygen Therapy: Patient Spontanous Breathing  Post-op Assessment: Report given to RN and Post -op Vital signs reviewed and stable  Post vital signs: Reviewed and stable  Last Vitals:  Vitals Value Taken Time  BP 104/76 11/20/19 1428  Temp    Pulse 79 11/20/19 1431  Resp 22 11/20/19 1431  SpO2 96 % 11/20/19 1431  Vitals shown include unvalidated device data.  Last Pain:  Vitals:   11/20/19 1147  TempSrc: Oral  PainSc: 5       Patients Stated Pain Goal: 0 (78/58/85 0277)  Complications: No complications documented.

## 2019-11-20 NOTE — Anesthesia Postprocedure Evaluation (Signed)
Anesthesia Post Note  Patient: [name]  Procedure(s) Performed: UPPER ESOPHAGEAL ENDOSCOPIC ULTRASOUND (EUS) (N/A ) COLONOSCOPY WITH PROPOFOL (N/A ) ESOPHAGOGASTRODUODENOSCOPY (EGD) WITH PROPOFOL (N/A ) BIOPSY ENDOSCOPIC MUCOSAL RESECTION HEMOSTASIS CLIP PLACEMENT FINE NEEDLE ASPIRATION (FNA) LINEAR     Patient location during evaluation: PACU Anesthesia Type: MAC Level of consciousness: awake and alert Pain management: pain level controlled Vital Signs Assessment: post-procedure vital signs reviewed and stable Respiratory status: spontaneous breathing and respiratory function stable Cardiovascular status: stable Postop Assessment: no apparent nausea or vomiting Anesthetic complications: no   No complications documented.  Last Vitals:  Vitals:   11/20/19 1440 11/20/19 1448  BP: (!) 108/35 (!) 127/38  Pulse: 76 73  Resp: 18 17  Temp:    SpO2: 96% 98%    Last Pain:  Vitals:   11/20/19 1448  TempSrc:   PainSc: 7                  Quanah Majka DANIEL

## 2019-11-20 NOTE — Anesthesia Procedure Notes (Signed)
Procedure Name: MAC Date/Time: 11/20/2019 12:19 PM Performed by: Trinna Post., CRNA Pre-anesthesia Checklist: Patient identified, Emergency Drugs available, Suction available, Patient being monitored and Timeout performed Patient Re-evaluated:Patient Re-evaluated prior to induction Oxygen Delivery Method: Nasal cannula Preoxygenation: Pre-oxygenation with 100% oxygen Induction Type: IV induction Placement Confirmation: positive ETCO2

## 2019-11-20 NOTE — Progress Notes (Signed)
PROGRESS NOTE    Nancy Hawkins  UEA:[id number] DOB: [birth year] DOA: 11/17/2019 PCP: Nancy Coma., MD   Brief Narrative:  HPI on 11/17/2019 by Dr. Burnadette Peter is a 79 y.o. female with medical history significant of HTN and HLD who presents for worsening intermittent abdominal and back pain. She states she has had intermittent abdominal pain and low back pain for about 1 months. She has seen her primary care doctor a couple of times since the pain began. At one point she was diagnosed with diabetes and prescribed Metformin (which she did not tolerate and stopped taking), she was thought to have GERD and given a trial of PPI, which has not helped her symptoms. The pain has remained intermittent, but has progressively worsened to a 10/10 last night, prompting her to visit urgent care leading to a subsequent transfer to the ED. she does not report specific aggravating aggravating or alleviating factors.  Pain at times feels like it is radiating around both sides to her back. she has intermittent associated nausea without vomiting but does not necessarily correlate with her intermittent pain.  She also reports a degree of constipation for the last several days.  She denies vomiting, diarrhea, chest pain, shortness of breath.  She has never had a colonoscopy.  Interim history Patient admitted with DKA, was placed on insulin drip, which is now completed.  She is now on sliding scale.  She was also noted to have pancreatic mass/inflammation.  Gastroenterology was consulted and plan for colonoscopy and EUS today. 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  Question pseudocyst versus IPMNs. -Placed on IV fluids and currently n.p.o -lipase and LFTs normal -CA 19-9 elevated at 228 -Gastroenterology planning for EUS and colonoscopy today  Back and abdominal pain -Likely secondary to the above, continue pain control  Essential hypertension -BP currently stable, continue Avapro, Cardizem  Focal narrowing of ascending colon -Radiology read notes this may be due to peristalsis however patient has never had a colonoscopy -As above, planning for colonoscopy later today  Hypokalemia/hypomagnesemia -Will continue to replace and monitor  DVT Prophylaxis SCDs  Code Status: Full  Family Communication: None at bedside  Disposition Plan:  Status is: Inpatient  Remains inpatient appropriate because:Persistent severe electrolyte disturbances, Ongoing active pain requiring inpatient pain management, Ongoing diagnostic testing needed not appropriate for outpatient work up and Inpatient level of care appropriate due to severity of illness.  Patient currently on IV fluids and n.p.o. for work-up of pancreatic mass and inflammation.   Dispo: The patient is from: Home              Anticipated d/c is to: Home              Anticipated d/c date is: 2 days              Patient currently is not medically stable to d/c.  Consultants Gastroenterology  Procedures  MRCP  Antibiotics   Anti-infectives (From admission, onward)   None      Subjective:   Nancy Hawkins seen and examined today.  Patient continues to have intermittent back  and abdominal pain but feels that it is better than previous.  States pain is more dull and less sharp.  Denies nausea or vomiting at this time.  Denies current chest pain or shortness of breath, dizziness or headache.  Objective:   Vitals:   11/20/19 0600 11/20/19 0746 11/20/19 0800 11/20/19 1058  BP: 126/63 128/62 118/77 136/65   Pulse: 77 78 83   Resp: 16 14 14    Temp:  98.3 F (36.8 C)  98.6 F (37 C)  TempSrc:  Oral  Oral  SpO2: 96% 97% 96%   Weight:      Height:        Intake/Output Summary (Last 24 hours) at 11/20/2019 1130 Last data filed at 11/20/2019 0600 Gross per 24 hour  Intake 2207.55 ml  Output --  Net 2207.55 ml   Filed Weights   11/17/19 1003 11/18/19 0025 11/18/19 2047  Weight: 65.3 kg 63 kg 66 kg    Exam  General: Well developed, well nourished, NAD, appears stated age  78: NCAT, mucous membranes moist.   Cardiovascular: S1 S2 auscultated, RRR, no murmur  Respiratory: Clear to auscultation bilaterally with equal chest rise  Abdomen: Soft, nontender, nondistended, + bowel sounds  Extremities: warm dry without cyanosis clubbing or edema  Neuro: AAOx3, nonfocal  Psych: appropriate mood and affect   Data Reviewed: I have personally reviewed following labs and imaging studies  CBC: Recent Labs  Lab 11/17/19 1013 11/17/19 1523 11/20/19 0041  WBC 15.2*  --  13.7*  HGB 12.3 13.6 10.7*  HCT 39.1 40.0 31.6*  MCV 90.5  --  84.9  PLT 287  --  818   Basic Metabolic Panel: Recent Labs  Lab 11/18/19 0237 11/18/19 0932 11/18/19 1310 11/19/19 0052 11/20/19 0041  NA 136 135 136 135 134*  K 3.1* 2.8* 3.0* 2.5* 2.8*  CL 110 109 108 105 105  CO2 18* 16* 17* 19* 18*  GLUCOSE 123* 123* 136* 127* 243*  BUN 8 8 7* 7* 7*  CREATININE 0.45 0.50 0.54 0.46 0.51  CALCIUM 8.4* 8.4* 9.1 8.6* 8.1*  MG  --   --   --  1.5* 1.5*   GFR: Estimated Creatinine Clearance: 53.3 mL/min (by C-G formula based on SCr of 0.51 mg/dL). Liver Function Tests: Recent Labs  Lab 11/17/19 1013  AST 9*  ALT 16  ALKPHOS 111  BILITOT 0.6  PROT 7.0  ALBUMIN 2.9*   Recent Labs  Lab 11/17/19 1013  LIPASE 34   No results for input(s): AMMONIA in the last 168 hours. Coagulation Profile: No results for input(s): INR, PROTIME in the last 168 hours. Cardiac Enzymes: No results for input(s):  CKTOTAL, CKMB, CKMBINDEX, TROPONINI in the last 168 hours. BNP (last 3 results) No results for input(s): PROBNP in the last 8760 hours. HbA1C: Recent Labs    11/17/19 2027  HGBA1C 11.0*   CBG: Recent Labs  Lab 11/19/19 0647 11/19/19 1233 11/19/19 1647 11/19/19 2205 11/20/19 0630  GLUCAP 193* 185* 171* 219* 192*   Lipid Profile: No results for input(s): CHOL, HDL, LDLCALC, TRIG, CHOLHDL, LDLDIRECT in the last 72 hours. Thyroid Function Tests: Recent Labs    11/17/19 1745  TSH 0.539   Anemia Panel: No results for input(s): VITAMINB12, FOLATE, FERRITIN, TIBC, IRON, RETICCTPCT in the last 72 hours. Urine analysis:    Component Value Date/Time   COLORURINE YELLOW 11/17/2019 1337   APPEARANCEUR CLEAR 11/17/2019 1337   LABSPEC 1.024 11/17/2019 1337   PHURINE 5.0 11/17/2019 1337  GLUCOSEU >=500 (A) 11/17/2019 1337   HGBUR NEGATIVE 11/17/2019 1337   BILIRUBINUR NEGATIVE 11/17/2019 1337   BILIRUBINUR small (A) 11/17/2019 0906   KETONESUR 80 (A) 11/17/2019 1337   PROTEINUR 30 (A) 11/17/2019 1337   UROBILINOGEN 0.2 11/17/2019 0906   NITRITE NEGATIVE 11/17/2019 1337   LEUKOCYTESUR NEGATIVE 11/17/2019 1337   Sepsis Labs: @LABRCNTIP (procalcitonin:4,lacticidven:4)  ) Recent Results (from the past 240 hour(s))  Respiratory Panel by RT PCR (Flu A&B, Covid) - Nasopharyngeal Swab     Status: None   Collection Time: 11/17/19  5:35 PM   Specimen: Nasopharyngeal Swab  Result Value Ref Range Status   SARS Coronavirus 2 by RT PCR NEGATIVE NEGATIVE Final    Comment: (NOTE) SARS-CoV-2 target nucleic acids are NOT DETECTED.  The SARS-CoV-2 RNA is generally detectable in upper respiratoy specimens during the acute phase of infection. The lowest concentration of SARS-CoV-2 viral copies this assay can detect is 131 copies/mL. A negative result does not preclude SARS-Cov-2 infection and should not be used as the sole basis for treatment or other patient management decisions. A  negative result may occur with  improper specimen collection/handling, submission of specimen other than nasopharyngeal swab, presence of viral mutation(s) within the areas targeted by this assay, and inadequate number of viral copies (<131 copies/mL). A negative result must be combined with clinical observations, patient history, and epidemiological information. The expected result is Negative.  Fact Sheet for Patients:  PinkCheek.be  Fact Sheet for Healthcare Providers:  GravelBags.it  This test is no t yet approved or cleared by the [place] FDA and  has been authorized for detection and/or diagnosis of SARS-CoV-2 by FDA under an Emergency Use Authorization (EUA). This EUA will remain  in effect (meaning this test can be used) for the duration of the COVID-19 declaration under Section 564(b)(1) of the Act, 21 U.S.C. section 360bbb-3(b)(1), unless the authorization is terminated or revoked sooner.     Influenza A by PCR NEGATIVE NEGATIVE Final   Influenza B by PCR NEGATIVE NEGATIVE Final    Comment: (NOTE) The Xpert Xpress SARS-CoV-2/FLU/RSV assay is intended as an aid in  the diagnosis of influenza from Nasopharyngeal swab specimens and  should not be used as a sole basis for treatment. Nasal washings and  aspirates are unacceptable for Xpert Xpress SARS-CoV-2/FLU/RSV  testing.  Fact Sheet for Patients: PinkCheek.be  Fact Sheet for Healthcare Providers: GravelBags.it  This test is not yet approved or cleared by the [place] FDA and  has been authorized for detection and/or diagnosis of SARS-CoV-2 by  FDA under an Emergency Use Authorization (EUA). This EUA will remain  in effect (meaning this test can be used) for the duration of the  Covid-19 declaration under Section 564(b)(1) of the Act, 21  U.S.C. section 360bbb-3(b)(1), unless the authorization  is  terminated or revoked. Performed at [facility], [place] [address] [place], [place] [address]   MRSA PCR Screening     Status: None   Collection Time: 11/18/19  8:56 PM   Specimen: Nasal Mucosa; Nasopharyngeal  Result Value Ref Range Status   MRSA by PCR NEGATIVE NEGATIVE Final    Comment:        The GeneXpert MRSA Assay (FDA approved for NASAL specimens only), is one component of a comprehensive MRSA colonization surveillance program. It is not intended to diagnose MRSA infection nor to guide or monitor treatment for MRSA infections. Performed at [facility], [place] [address], [place],  [address]  Radiology Studies: MR ABDOMEN MRCP W WO CONTAST  Result Date: 11/18/2019 CLINICAL DATA:  Pancreatic mass EXAM: MRI ABDOMEN WITHOUT AND WITH CONTRAST (INCLUDING MRCP) TECHNIQUE: Multiplanar multisequence MR imaging of the abdomen was performed both before and after the administration of intravenous contrast. Heavily T2-weighted images of the biliary and pancreatic ducts were obtained, and three-dimensional MRCP images were rendered by post processing. CONTRAST:  49mL GADAVIST GADOBUTROL 1 MMOL/ML IV SOLN COMPARISON:  CT abdomen pelvis, 11/17/2019 FINDINGS: Lower chest: Trace bilateral pleural effusions and associated atelectasis or consolidation. Hepatobiliary: No mass or other parenchymal abnormality identified. Status post cholecystectomy. No biliary ductal dilatation. Pancreas: There is extensive inflammatory fat stranding about the pancreas, with a thinly septated multi cystic fluid signal lesion of the anterior pancreatic neck measuring 3.2 x 1.7 cm, without evidence of solid component or contrast enhancement (series 5, image 21). There are numerous additional small fluid signal lesions throughout the pancreatic parenchyma, the remaining lesions subcentimeter. The central portion of the pancreatic duct is dilated, measuring up to 8 mm (series 24, image 51). Spleen:   Within normal limits in size and appearance. Adrenals/Urinary Tract: No masses identified. No evidence of hydronephrosis. Stomach/Bowel: Visualized portions within the abdomen are unremarkable. Vascular/Lymphatic: No pathologically enlarged lymph nodes identified. No abdominal aortic aneurysm demonstrated. The portal vein appears somewhat compressed posterior to the pancreatic head, perhaps due to edema in this vicinity, but remains patent. Other:  Trace perihepatic ascites. Musculoskeletal: No suspicious bone lesions identified. IMPRESSION: 1. There is extensive inflammatory fat stranding about the pancreas, with a thinly septated multi cystic fluid signal lesion of the anterior pancreatic neck measuring 3.2 x 1.7 cm, without evidence of solid component or contrast enhancement. 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 2. There are numerous additional small fluid signal lesions throughout the pancreatic parenchyma, the remaining lesions subcentimeter. These likely reflect additional pseudocysts and or small IPMNs. Consider follow-up MRI at 6-12 months to assess for stability. 3. The central portion of the pancreatic duct is dilated, measuring up to 8 mm, without obvious obstructing lesion identified to the ampulla. 4. Trace ascites. 5. Trace bilateral pleural effusions and associated atelectasis or consolidation. Electronically Signed   By: Eddie Candle M.D.   On: 11/18/2019 20:16     Scheduled Meds: . diltiazem  120 mg Oral Daily  . insulin aspart  0-15 Units Subcutaneous TID AC & HS  . insulin aspart  2 Units Subcutaneous TID WC  . insulin glargine  8 Units Subcutaneous Q24H  . irbesartan  75 mg Oral Daily  . oxyCODONE  5 mg Oral Q6H   Continuous Infusions: . lactated ringers 75 mL/hr at 11/20/19 0600  . potassium chloride 10 mEq (11/20/19 1053)     LOS: 3 days   Time Spent in minutes   30 minutes  Doriann Zuch D.O. on 11/20/2019 at 11:30 AM  Between 7am to 7pm - Please  see pager noted on [email or web address]  After 7pm go to [email or web address]  And look for the night coverage person covering for me after hours  [facility] Office  [phone]

## 2019-11-20 NOTE — Op Note (Signed)
[facility] Patient Name: Nancy Hawkins Procedure Date : 11/20/2019 MRN: [id number] Attending MD: [name] , MD Date of Birth: Jul 23, 1940 CSN: [id number] Age: 79 Admit Type: Inpatient Procedure:                Colonoscopy Indications:              Screening for colorectal malignant neoplasm,                            Incidental - Abnormal CT of the GI tract - concern                            for ascending colon abnormality Providers:                [name], MD, Baird Cancer, RN, Ladona Ridgel, Technician, Dewitt Hoes, CRNA Referring MD:             Lajuan Lines. Pyrtle, MD, [facility], Lilian Coma Medicines:                Monitored Anesthesia Care Complications:            No immediate complications. Estimated Blood Loss:     Estimated blood loss was minimal. Procedure:                Pre-Anesthesia Assessment:                           - Prior to the procedure, a History and Physical                            was performed, and patient medications and                            allergies were reviewed. The patient's tolerance of                            previous anesthesia was also reviewed. The risks                            and benefits of the procedure and the sedation                            options and risks were discussed with the patient.                            All questions were answered, and informed consent                            was obtained. Prior Anticoagulants: The patient has  taken no previous anticoagulant or antiplatelet                            agents. ASA Grade Assessment: III - A patient with                            severe systemic disease. After reviewing the risks                            and benefits, the patient was deemed in                            satisfactory condition to undergo the procedure.                            After obtaining informed consent, the colonoscope                            was passed under direct vision. Throughout the                            procedure, the patient's blood pressure, pulse, and                            oxygen saturations were monitored continuously. The                            PCF-H190DL ([id number] Olympus pediatric colonoscope                            was introduced through the anus and advanced to the                            5 cm into the ileum. The colonoscopy was                            technically difficult and complex due to multiple                            diverticula in the colon, restricted mobility of                            the colon and a tortuous colon. Successful                            completion of the procedure was aided by performing                            the maneuvers documented (below) in this report.                            The patient tolerated the procedure. The quality of  the bowel preparation was adequate. The terminal                            ileum, ileocecal valve, appendiceal orifice, and                            rectum were photographed. Scope In: 12:37:11 PM Scope Out: 1:12:26 PM Scope Withdrawal Time: 0 hours 25 minutes 45 seconds  Total Procedure Duration: 0 hours 35 minutes 15 seconds  Findings:      The digital rectal exam findings include hemorrhoids. Pertinent       negatives include no palpable rectal lesions.      The terminal ileum and ileocecal valve appeared normal.      The right colon was grossly tortuous. Advancing the scope required       changing the patient's position to near supine, using manual pressure,       withdrawing and reinserting the scope, straightening and shortening the       scope to obtain bowel loop reduction and using scope torsion with       significant water immersion.      A large amount of liquid semi-liquid stool was found in  the entire       colon, interfering with visualization. Lavage of the area was performed       using copious amounts, resulting in clearance with adequate       visualization.      Five sessile polyps were found in the transverse colon (1), hepatic       flexure (2), ascending colon (1) and on ileocecal valve (1). The polyps       were 3 to 14 mm in size. These polyps were removed with a cold snare.       Resection and retrieval were complete.      Two small-medium angioectasias with typical arborization were found in       the proximal ascending colon.      Many small and large-mouthed diverticula were found in the entire colon.      Normal mucosa was found in the entire colon otherwise.      Non-bleeding non-thrombosed external and internal hemorrhoids were found       during retroflexion, during perianal exam and during digital exam. The       hemorrhoids were Grade II (internal hemorrhoids that prolapse but reduce       spontaneously). Impression:               - Hemorrhoids found on digital rectal exam.                           - The examined portion of the ileum was normal.                           - Tortuous colon. Water immersion and maneuvers                            above allowed safe passage through the region.                           - Stool in the entire examined colon. Lavaged with  adequate visualization.                           - Five 3 to 14 mm polyps in the transverse colon,                            at the hepatic flexure, in the ascending colon and                            at the ileocecal valve, removed with a cold snare.                            Resected and retrieved.                           - Two colonic angioectasias.                           - Diverticulosis in the entire examined colon.                           - Normal mucosa in the entire examined colon                            otherwise.                            - Non-bleeding non-thrombosed external and internal                            hemorrhoids. Recommendation:           - Proceed to scheduled EGD/EUS.                           - Await pathology results.                           - Repeat colonoscopy in 3 years for surveillance                            based on pathology results would normally be                            recommended, however due to patient's age will need                            to have a discussion at that time as to whether                            further colonoscopies for surveillance and                            colorectal cancer should be performed.                           -  If evidence of IDA develops should consider                            potential role of Oral/IV Iron supplementation vs                            need for APC ablation.                           - The findings and recommendations were discussed                            with the patient.                           - The findings and recommendations were discussed                            with the referring physician. Procedure Code(s):        --- Professional ---                           [phone], Colonoscopy, flexible; with removal of                            tumor(s), polyp(s), or other lesion(s) by snare                            technique Diagnosis Code(s):        --- Professional ---                           Z12.11, Encounter for screening for malignant                            neoplasm of colon                           K64.1, Second degree hemorrhoids                           K63.5, Polyp of colon                           K55.20, Angiodysplasia of colon without hemorrhage                           K57.30, Diverticulosis of large intestine without                            perforation or abscess without bleeding                           Q43.8, Other specified congenital malformations of                             intestine CPT copyright 2019 American  Medical Association. All rights reserved. The codes documented in this report are preliminary and upon coder review may  be revised to meet current compliance requirements. [name], MD 11/20/2019 2:32:06 PM Number of Addenda: 0

## 2019-11-20 NOTE — Interval H&P Note (Signed)
History and Physical Interval Note:  11/20/2019 12:01 PM  Nancy Hawkins  has presented today for surgery, with the diagnosis of Abnormality in the mid body of pancreas seen on CT, inflammation and likely mass..  The various methods of treatment have been discussed with the patient and family. After consideration of risks, benefits and other options for treatment, the patient has consented to  Procedure(s): UPPER ESOPHAGEAL ENDOSCOPIC ULTRASOUND (EUS) (N/A) COLONOSCOPY WITH PROPOFOL (N/A) as a surgical intervention.  The patient's history has been reviewed, patient examined, no change in status, stable for surgery.  I have reviewed the patient's chart and labs.  Questions were answered to the patient's satisfaction.     The risks and benefits of endoscopic evaluation were discussed with the patient; these include but are not limited to the risk of perforation, infection, bleeding, missed lesions, lack of diagnosis, severe illness requiring hospitalization, as well as anesthesia and sedation related illnesses.  The patient is agreeable to proceed.    The risks of an EUS including intestinal perforation, bleeding, infection, aspiration, and medication effects were discussed as was the possibility it may not give a definitive diagnosis if a biopsy is performed.  When a biopsy of the pancreas is done as part of the EUS, there is an additional risk of pancreatitis at the rate of about 1-2%.  It was explained that procedure related pancreatitis is typically mild, although it can be severe and even life threatening, which is why we do not perform random pancreatic biopsies and only biopsy a lesion/area we feel is concerning enough to warrant the risk.   [employer]

## 2019-11-20 NOTE — Care Management Important Message (Signed)
Important Message  Patient Details  Name: Nancy Hawkins MRN: [id number] Date of Birth: [birth year]   Medicare Important Message Given:  Yes     Braylen Staller 11/20/2019, 3:09 PM

## 2019-11-21 DIAGNOSIS — K859 Acute pancreatitis without necrosis or infection, unspecified: Principal | ICD-10-CM

## 2019-11-21 DIAGNOSIS — K8689 Other specified diseases of pancreas: Secondary | ICD-10-CM | POA: Diagnosis not present

## 2019-11-21 DIAGNOSIS — I10 Essential (primary) hypertension: Secondary | ICD-10-CM | POA: Diagnosis not present

## 2019-11-21 DIAGNOSIS — E0810 Diabetes mellitus due to underlying condition with ketoacidosis without coma: Secondary | ICD-10-CM | POA: Diagnosis not present

## 2019-11-21 LAB — BASIC METABOLIC PANEL
Anion gap: 11 (ref 5–15)
BUN: 5 mg/dL — ABNORMAL LOW (ref 8–23)
CO2: 19 mmol/L — ABNORMAL LOW (ref 22–32)
Calcium: 7.4 mg/dL — ABNORMAL LOW (ref 8.9–10.3)
Chloride: 104 mmol/L (ref 98–111)
Creatinine, Ser: 0.5 mg/dL (ref 0.44–1.00)
GFR, Estimated: >60 mL/min (ref >60)
Glucose, Bld: 160 mg/dL — ABNORMAL HIGH (ref 70–99)
Potassium: 2.8 mmol/L — ABNORMAL LOW (ref 3.5–5.1)
Sodium: 134 mmol/L — ABNORMAL LOW (ref 135–145)

## 2019-11-21 LAB — GLUCOSE, CAPILLARY
Glucose-Capillary: 131 mg/dL — ABNORMAL HIGH (ref 70–99)
Glucose-Capillary: 157 mg/dL — ABNORMAL HIGH (ref 70–99)
Glucose-Capillary: 153 mg/dL — ABNORMAL HIGH (ref 70–99)
Glucose-Capillary: 167 mg/dL — ABNORMAL HIGH (ref 70–99)

## 2019-11-21 LAB — CBC
HCT: 30 % — ABNORMAL LOW (ref 36.0–46.0)
Hemoglobin: 10.2 g/dL — ABNORMAL LOW (ref 12.0–15.0)
MCH: 28.4 pg (ref 26.0–34.0)
MCHC: 34 g/dL (ref 30.0–36.0)
MCV: 83.6 fL (ref 80.0–100.0)
Platelets: 210 10*3/uL (ref 150–400)
RBC: 3.59 MIL/uL — ABNORMAL LOW (ref 3.87–5.11)
RDW: 13.9 % (ref 11.5–15.5)
WBC: 11.6 10*3/uL — ABNORMAL HIGH (ref 4.0–10.5)
nRBC: 0 % (ref 0.0–0.2)

## 2019-11-21 LAB — MAGNESIUM: Magnesium: 1.7 mg/dL (ref 1.7–2.4)

## 2019-11-21 MED ORDER — CIPROFLOXACIN HCL 500 MG PO TABS: 500.0000 mg | ORAL_TABLET | Freq: Two times a day (BID) | ORAL | Status: DC

## 2019-11-21 MED ORDER — POTASSIUM CHLORIDE 20 MEQ/15ML (10%) PO SOLN
40.0000 meq | Freq: Three times a day (TID) | ORAL | Status: AC
  Administered 2019-11-21 (×3): 40 meq via ORAL
  Filled 2019-11-21 (×3): qty 30

## 2019-11-21 MED ORDER — DOCUSATE SODIUM 100 MG PO CAPS
100.0000 mg | ORAL_CAPSULE | Freq: Every day | ORAL | Status: AC
  Administered 2019-11-22: 100 mg via ORAL
  Filled 2019-11-21: qty 1

## 2019-11-21 MED ORDER — OXYCODONE HCL ER 15 MG PO T12A
15.0000 mg | EXTENDED_RELEASE_TABLET | Freq: Two times a day (BID) | ORAL | Status: DC
  Administered 2019-11-21 – 2019-11-22 (×4): 15 mg via ORAL
  Filled 2019-11-21 (×4): qty 1

## 2019-11-21 MED ORDER — MAGNESIUM SULFATE 4 GM/100ML IV SOLN
4.0000 g | Freq: Once | INTRAVENOUS | Status: AC
  Administered 2019-11-21: 4 g via INTRAVENOUS
  Filled 2019-11-21: qty 100

## 2019-11-21 MED ORDER — POTASSIUM CHLORIDE 10 MEQ/100ML IV SOLN
10.0000 meq | INTRAVENOUS | Status: AC
  Administered 2019-11-21 (×3): 10 meq via INTRAVENOUS
  Filled 2019-11-21 (×3): qty 100

## 2019-11-21 MED ORDER — CIPROFLOXACIN HCL 500 MG PO TABS
500.0000 mg | ORAL_TABLET | Freq: Two times a day (BID) | ORAL | Status: AC
  Administered 2019-11-21 – 2019-11-23 (×4): 500 mg via ORAL
  Filled 2019-11-21 (×4): qty 1

## 2019-11-21 NOTE — Progress Notes (Signed)
PROGRESS NOTE    Nancy Hawkins  TMH:[id number] DOB: Jul 20, 1940 DOA: 11/17/2019 PCP: Lilian Coma., MD   Brief Narrative:  HPI on 11/17/2019 by Dr. Burnadette Peter is a 79 y.o. female with medical history significant of HTN and HLD who presents for worsening intermittent abdominal and back pain. She states she has had intermittent abdominal pain and low back pain for about 1 months. She has seen her primary care doctor a couple of times since the pain began. At one point she was diagnosed with diabetes and prescribed Metformin (which she did not tolerate and stopped taking), she was thought to have GERD and given a trial of PPI, which has not helped her symptoms. The pain has remained intermittent, but has progressively worsened to a 10/10 last night, prompting her to visit urgent care leading to a subsequent transfer to the ED. she does not report specific aggravating aggravating or alleviating factors.  Pain at times feels like it is radiating around both sides to her back. she has intermittent associated nausea without vomiting but does not necessarily correlate with her intermittent pain.  She also reports a degree of constipation for the last several days.  She denies vomiting, diarrhea, chest pain, shortness of breath.  She has never had a colonoscopy.  Interim history Patient admitted with DKA, was placed on insulin drip, which is now completed.  She is now on sliding scale.  She was also noted to have pancreatic mass/inflammation.  Gastroenterology was consulted, s/p colonoscopy and EUS, pending bx results. 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  Question pseudocyst versus IPMNs. -Placed on IV fluids and currently n.p.o -lipase and LFTs normal -CA 19-9 elevated at 228 -Gastroenterology consulted and appreciated.  -S/p colonoscopy which showed 5 polyps ranging from 3 to 2 mm, removed with a cold snare.  2 colonic angiectasias, nonbleeding nonthrombosed external and internal hemorrhoids -Status post EGD/EUS: White nummular lesions in the esophageal mucosa, gastritis.  Hematin in the entire stomach.  One gastric polyp resected and retrieved, clips were placed.  Pancreatic body lesion.  Cystic lesion seen in the pancreatic head. -GI recommended PPI 40 mg twice daily for 1 month, then 40 mg thereafter -cytology and surgical pathology   Back and abdominal pain -Likely secondary to the above, continue pain control  Essential hypertension -BP currently stable, continue Avapro, Cardizem  Focal narrowing of ascending colon -Radiology read notes this may be due to peristalsis however patient has never had a colonoscopy  Hypokalemia/hypomagnesemia -Despite aggressive replacement, patient continues to have hypokalemia, 2.8 as well as hypomagnesemia -Will continue to replace potassium with both IV and oral replacement -Will replace magnesium as well -continue to monitor   DVT Prophylaxis SCDs  Code Status: Full  Family Communication: None at bedside  Disposition Plan:  Status is: Inpatient  Remains inpatient appropriate because:Persistent severe electrolyte disturbances, Ongoing active pain requiring inpatient pain management, Ongoing diagnostic testing needed not appropriate for outpatient work up and Inpatient level of care appropriate due to severity of illness.  Continue electrolyte replacement.  Currently on clear liquid diet   Dispo: The patient is from:  Home              Anticipated d/c is to: Home              Anticipated d/c date is: 2  days              Patient currently is not medically stable to d/c.  Consultants Gastroenterology  Procedures  MRCP Colonoscopy EUS and EGD  Antibiotics   Anti-infectives (From admission, onward)   Start     Dose/Rate Route Frequency Ordered Stop   11/21/19 2200  ciprofloxacin (CIPRO) IVPB 400 mg        400 mg 200 mL/hr over 60 Minutes Intravenous Every 12 hours 11/20/19 1503        Subjective:   Nancy Hawkins seen and examined today.  Patient continues to have some intermittent back and abdominal pain.  She denies current nausea or vomiting.  Denies chest pain or shortness of breath, dizziness or headache.    Objective:   Vitals:   11/21/19 0000 11/21/19 0337 11/21/19 0714 11/21/19 1115  BP:  (!) 121/49 (!) 121/53 (!) 111/59  Pulse:  80 75 88  Resp: 18 19 14 17   Temp:  98.7 F (37.1 C) 98.4 F (36.9 C) 99.1 F (37.3 C)  TempSrc:  Oral Oral Oral  SpO2:  96% 95% 95%  Weight:      Height:        Intake/Output Summary (Last 24 hours) at 11/21/2019 1212 Last data filed at 11/21/2019 1200 Gross per 24 hour  Intake 2084.08 ml  Output 500 ml  Net 1584.08 ml   Filed Weights   11/17/19 1003 11/18/19 0025 11/18/19 2047  Weight: 65.3 kg 63 kg 66 kg    Exam  General: Well developed, well nourished, NAD, appears stated age  7: NCAT, mucous membranes moist.   Cardiovascular: S1 S2 auscultated, RRR, no murmur  Respiratory: CTA bilaterally  Abdomen: Soft, nontender, nondistended, + bowel sounds  Extremities: warm dry without cyanosis clubbing or edema  Neuro: AAOx3, nonfocal  Psych: appropriate mood and affect, pleasant    Data Reviewed: I have personally reviewed following labs and imaging studies  CBC: Recent Labs  Lab 11/17/19 1013 11/17/19 1523 11/20/19 0041 11/20/19 1207 11/21/19 0037  WBC 15.2*  --  13.7*  --  11.6*  HGB 12.3 13.6 10.7* 11.2* 10.2*  HCT 39.1 40.0 31.6* 33.0* 30.0*  MCV 90.5  --  84.9  --  83.6  PLT 287  --  233  --  144    Basic Metabolic Panel: Recent Labs  Lab 11/18/19 0932 11/18/19 0932 11/18/19 1310 11/19/19 0052 11/20/19 0041 11/20/19 1207 11/21/19 0037  NA 135   < > 136 135 134* 139 134*  K 2.8*   < > 3.0* 2.5* 2.8* 2.8* 2.8*  CL 109   < > 108 105 105 104 104  CO2 16*  --  17* 19* 18*  --  19*  GLUCOSE 123*   < > 136* 127* 243* 181* 160*  BUN 8   < > 7* 7* 7* 6* 5*  CREATININE 0.50   < > 0.54 0.46 0.51 0.30* 0.50  CALCIUM 8.4*  --  9.1 8.6* 8.1*  --  7.4*  MG  --   --   --  1.5* 1.5*  --  1.7   < > = values in this interval not displayed.   GFR: Estimated Creatinine Clearance: 53.3 mL/min (by C-G formula based on SCr of 0.5  mg/dL). Liver Function Tests: Recent Labs  Lab 11/17/19 1013  AST 9*  ALT 16  ALKPHOS 111  BILITOT 0.6  PROT 7.0  ALBUMIN 2.9*   Recent Labs  Lab 11/17/19 1013  LIPASE 34   No results for input(s): AMMONIA in the last 168 hours. Coagulation Profile: No results for input(s): INR, PROTIME in the last 168 hours. Cardiac Enzymes: No results for input(s): CKTOTAL, CKMB, CKMBINDEX, TROPONINI in the last 168 hours. BNP (last 3 results) No results for input(s): PROBNP in the last 8760 hours. HbA1C: No results for input(s): HGBA1C in the last 72 hours. CBG: Recent Labs  Lab 11/20/19 0630 11/20/19 1706 11/20/19 2146 11/21/19 0619 11/21/19 1206  GLUCAP 192* 180* 139* 131* 157*   Lipid Profile: No results for input(s): CHOL, HDL, LDLCALC, TRIG, CHOLHDL, LDLDIRECT in the last 72 hours. Thyroid Function Tests: No results for input(s): TSH, T4TOTAL, FREET4, T3FREE, THYROIDAB in the last 72 hours. Anemia Panel: No results for input(s): VITAMINB12, FOLATE, FERRITIN, TIBC, IRON, RETICCTPCT in the last 72 hours. Urine analysis:    Component Value Date/Time   COLORURINE YELLOW 11/17/2019 1337   APPEARANCEUR CLEAR 11/17/2019 1337   LABSPEC 1.024 11/17/2019 1337   PHURINE 5.0 11/17/2019 1337   GLUCOSEU >=500 (A) 11/17/2019 1337   HGBUR NEGATIVE 11/17/2019  1337   BILIRUBINUR NEGATIVE 11/17/2019 1337   BILIRUBINUR small (A) 11/17/2019 0906   KETONESUR 80 (A) 11/17/2019 1337   PROTEINUR 30 (A) 11/17/2019 1337   UROBILINOGEN 0.2 11/17/2019 0906   NITRITE NEGATIVE 11/17/2019 1337   LEUKOCYTESUR NEGATIVE 11/17/2019 1337   Sepsis Labs: @LABRCNTIP (procalcitonin:4,lacticidven:4)  ) Recent Results (from the past 240 hour(s))  Respiratory Panel by RT PCR (Flu A&B, Covid) - Nasopharyngeal Swab     Status: None   Collection Time: 11/17/19  5:35 PM   Specimen: Nasopharyngeal Swab  Result Value Ref Range Status   SARS Coronavirus 2 by RT PCR NEGATIVE NEGATIVE Final    Comment: (NOTE) SARS-CoV-2 target nucleic acids are NOT DETECTED.  The SARS-CoV-2 RNA is generally detectable in upper respiratoy specimens during the acute phase of infection. The lowest concentration of SARS-CoV-2 viral copies this assay can detect is 131 copies/mL. A negative result does not preclude SARS-Cov-2 infection and should not be used as the sole basis for treatment or other patient management decisions. A negative result may occur with  improper specimen collection/handling, submission of specimen other than nasopharyngeal swab, presence of viral mutation(s) within the areas targeted by this assay, and inadequate number of viral copies (<131 copies/mL). A negative result must be combined with clinical observations, patient history, and epidemiological information. The expected result is Negative.  Fact Sheet for Patients:  PinkCheek.be  Fact Sheet for Healthcare Providers:  GravelBags.it  This test is no t yet approved or cleared by the [place] FDA and  has been authorized for detection and/or diagnosis of SARS-CoV-2 by FDA under an Emergency Use Authorization (EUA). This EUA will remain  in effect (meaning this test can be used) for the duration of the COVID-19 declaration under Section 564(b)(1)  of the Act, 21 U.S.C. section 360bbb-3(b)(1), unless the authorization is terminated or revoked sooner.     Influenza A by PCR NEGATIVE NEGATIVE Final   Influenza B by PCR NEGATIVE NEGATIVE Final    Comment: (NOTE) The Xpert Xpress SARS-CoV-2/FLU/RSV assay is intended as an aid in  the diagnosis of influenza from Nasopharyngeal swab specimens and  should not be used as a sole basis for treatment. Nasal  washings and  aspirates are unacceptable for Xpert Xpress SARS-CoV-2/FLU/RSV  testing.  Fact Sheet for Patients: PinkCheek.be  Fact Sheet for Healthcare Providers: GravelBags.it  This test is not yet approved or cleared by the [place] FDA and  has been authorized for detection and/or diagnosis of SARS-CoV-2 by  FDA under an Emergency Use Authorization (EUA). This EUA will remain  in effect (meaning this test can be used) for the duration of the  Covid-19 declaration under Section 564(b)(1) of the Act, 21  U.S.C. section 360bbb-3(b)(1), unless the authorization is  terminated or revoked. Performed at [facility], [place] [address], [place], [place] [address]   MRSA PCR Screening     Status: None   Collection Time: 11/18/19  8:56 PM   Specimen: Nasal Mucosa; Nasopharyngeal  Result Value Ref Range Status   MRSA by PCR NEGATIVE NEGATIVE Final    Comment:        The GeneXpert MRSA Assay (FDA approved for NASAL specimens only), is one component of a comprehensive MRSA colonization surveillance program. It is not intended to diagnose MRSA infection nor to guide or monitor treatment for MRSA infections. Performed at [facility], [place] [address], [place], [place] [address]       Radiology Studies: No results found.   Scheduled Meds: . diltiazem  120 mg Oral Daily  . insulin aspart  0-15 Units Subcutaneous TID AC & HS  . insulin aspart  2 Units Subcutaneous TID WC  . insulin glargine  8 Units  Subcutaneous Q24H  . irbesartan  75 mg Oral Daily  . pantoprazole  40 mg Oral BID AC  . potassium chloride  40 mEq Oral TID   Continuous Infusions: . ciprofloxacin    . lactated ringers 75 mL/hr at 11/21/19 0600     LOS: 4 days   Time Spent in minutes   30 minutes  Keiandra Sullenger D.O. on 11/21/2019 at 12:12 PM  Between 7am to 7pm - Please see pager noted on [email or web address]  After 7pm go to [email or web address]  And look for the night coverage person covering for me after hours  [facility] Office  [phone]

## 2019-11-21 NOTE — Plan of Care (Signed)

## 2019-11-21 NOTE — Progress Notes (Signed)
Daily Rounding Note  11/21/2019, 1:32 PM  LOS: 4 days   SUBJECTIVE:   Chief complaint:  Back and abdominal pain.  Lesions in pancreas.  Colon polyps   Back pain still severe, when bad it makes her nauseated.   No emesis.    OBJECTIVE:         Vital signs in last 24 hours:    Temp:  [97 F (36.1 C)-100.5 F (38.1 C)] 99.1 F (37.3 C) (10/14 1115) Pulse Rate:  [73-94] 88 (10/14 1115) Resp:  [14-20] 17 (10/14 1115) BP: (104-139)/(35-76) 111/59 (10/14 1115) SpO2:  [95 %-98 %] 95 % (10/14 1115) Last BM Date: 11/20/19 Filed Weights   11/17/19 1003 11/18/19 0025 11/18/19 2047  Weight: 65.3 kg 63 kg 66 kg   General: pale, uncomfortable, laying flat in pain   Heart: RRR Chest: shallow breathing.  Lungs clear.   Abdomen: soft, minor tenderness across upper, mid abdomen.  BS hypoactive.    Extremities: no CCE Neuro/Psych:  Alert, oriented x 3.    Intake/Output from previous day: 10/13 0701 - 10/14 0700 In: 1359.1 [I.V.:1328.9; IV Piggyback:30.2] Out: 500 [Urine:500]  Intake/Output this shift: Total I/O In: 725 [P.O.:100; I.V.:225; IV Piggyback:400] Out: -   Lab Results: Recent Labs    11/20/19 0041 11/20/19 1207 11/21/19 0037  WBC 13.7*  --  11.6*  HGB 10.7* 11.2* 10.2*  HCT 31.6* 33.0* 30.0*  PLT 233  --  210   BMET Recent Labs    11/19/19 0052 11/19/19 0052 11/20/19 0041 11/20/19 1207 11/21/19 0037  NA 135   < > 134* 139 134*  K 2.5*   < > 2.8* 2.8* 2.8*  CL 105   < > 105 104 104  CO2 19*  --  18*  --  19*  GLUCOSE 127*   < > 243* 181* 160*  BUN 7*   < > 7* 6* 5*  CREATININE 0.46   < > 0.51 0.30* 0.50  CALCIUM 8.6*  --  8.1*  --  7.4*   < > = values in this interval not displayed.   LFT No results for input(s): PROT, ALBUMIN, AST, ALT, ALKPHOS, BILITOT, BILIDIR, IBILI in the last 72 hours. PT/INR No results for input(s): LABPROT, INR in the last 72 hours. Hepatitis Panel No results for  input(s): HEPBSAG, HCVAB, HEPAIGM, HEPBIGM in the last 72 hours.  Studies/Results: No results found.  ASSESMENT:   *   Back and abdominal pain Imaging w CT and MRI/MRCP showing 3.2 by 1.7 cm pancreatic pseudocyst And other smaller cystic lesions ? Pseudocysts vs IPMNs.  PD dilated centrally.  No malignant lesions.  Lipase and LFTs normal.  CA 19-9 is 228. 10/13 EGD/EUS.  White lesions in esophagus, biopsied for Candida.  Blood/coffee ground material throughout the stomach.  Solitary nonbleeding gastric polyp resected/retrieved via mucosal resection and site endoclipped.  Gastric mucosa normal but biopsied for H. Pylori. Hyperechoic, heterogeneous, possible cystic component to 27 x 19 mm diameter lesion at the pancreatic body.  Poorly defined borders and suggestion this abuts the splenic artery.  Pancreatic duct dilated in the region of the head and neck decompressed to more normal diameter towards the tail.  Pancreatic parenchyma lobular, concerning for active/recent pancreatitis.  FNA sampling x6 passes obtained from pancreatic lesion.  Separate lesion suggestive of cyst seen at pancreatic head, not in obvious communication with pancreatic duct, measuring 20 x 7 mm, CBD 4.7 mm, CHD 8.7 mm  and no abnormal appearance to either duct.  Back pain still severe.    *   Narrowing at transverse colon on CT  11/20/2019 colonoscopy.  Tortuous colon with stool throughout colon lavaged to achieve adequate visualization.  Five, 3 to 14 mm, polyps removed throughout the colon.  2, nonbleeding, angioectasias not treated.  Pandiverticulosis.  Nonbleeding external, internal hemorrhoids.   PLAN   *   Awaiting report from FNA and path from colon polyps.    *   Switch to po cipro for additional 2 days, as 3 d suggested by Dr Jerilynn Mages Added Oxycontin q 12 hours x 6 doses.  Added colace.  Leave on clear liquids.       Nancy Hawkins  11/21/2019, 1:32 PM Phone [phone]

## 2019-11-22 DIAGNOSIS — K859 Acute pancreatitis without necrosis or infection, unspecified: Secondary | ICD-10-CM | POA: Diagnosis not present

## 2019-11-22 DIAGNOSIS — K8689 Other specified diseases of pancreas: Secondary | ICD-10-CM | POA: Diagnosis not present

## 2019-11-22 DIAGNOSIS — I10 Essential (primary) hypertension: Secondary | ICD-10-CM | POA: Diagnosis not present

## 2019-11-22 DIAGNOSIS — E0810 Diabetes mellitus due to underlying condition with ketoacidosis without coma: Secondary | ICD-10-CM | POA: Diagnosis not present

## 2019-11-22 LAB — GLUCOSE, CAPILLARY
Glucose-Capillary: 128 mg/dL — ABNORMAL HIGH (ref 70–99)
Glucose-Capillary: 155 mg/dL — ABNORMAL HIGH (ref 70–99)
Glucose-Capillary: 164 mg/dL — ABNORMAL HIGH (ref 70–99)
Glucose-Capillary: 179 mg/dL — ABNORMAL HIGH (ref 70–99)
Glucose-Capillary: 185 mg/dL — ABNORMAL HIGH (ref 70–99)
Glucose-Capillary: 190 mg/dL — ABNORMAL HIGH (ref 70–99)

## 2019-11-22 LAB — CBC
HCT: 28.7 % — ABNORMAL LOW (ref 36.0–46.0)
Hemoglobin: 9.6 g/dL — ABNORMAL LOW (ref 12.0–15.0)
MCH: 28.7 pg (ref 26.0–34.0)
MCHC: 33.4 g/dL (ref 30.0–36.0)
MCV: 85.7 fL (ref 80.0–100.0)
Platelets: 245 10*3/uL (ref 150–400)
RBC: 3.35 MIL/uL — ABNORMAL LOW (ref 3.87–5.11)
RDW: 14.1 % (ref 11.5–15.5)
WBC: 14.9 10*3/uL — ABNORMAL HIGH (ref 4.0–10.5)
nRBC: 0 % (ref 0.0–0.2)

## 2019-11-22 LAB — BASIC METABOLIC PANEL
Anion gap: 7 (ref 5–15)
BUN: 6 mg/dL — ABNORMAL LOW (ref 8–23)
CO2: 24 mmol/L (ref 22–32)
Calcium: 7.5 mg/dL — ABNORMAL LOW (ref 8.9–10.3)
Chloride: 103 mmol/L (ref 98–111)
Creatinine, Ser: 0.38 mg/dL — ABNORMAL LOW (ref 0.44–1.00)
GFR, Estimated: >60 mL/min (ref >60)
Glucose, Bld: 186 mg/dL — ABNORMAL HIGH (ref 70–99)
Potassium: 4.1 mmol/L (ref 3.5–5.1)
Sodium: 134 mmol/L — ABNORMAL LOW (ref 135–145)

## 2019-11-22 LAB — CYTOLOGY - NON PAP

## 2019-11-22 LAB — SURGICAL PATHOLOGY

## 2019-11-22 LAB — MAGNESIUM: Magnesium: 1.7 mg/dL (ref 1.7–2.4)

## 2019-11-22 MED ORDER — PANTOPRAZOLE SODIUM 40 MG PO TBEC
40.0000 mg | DELAYED_RELEASE_TABLET | Freq: Every day | ORAL | Status: DC
  Administered 2019-11-24 – 2019-11-28 (×5): 40 mg via ORAL
  Filled 2019-11-22 (×5): qty 1

## 2019-11-22 MED ORDER — MAGNESIUM SULFATE 2 GM/50ML IV SOLN
2.0000 g | Freq: Once | INTRAVENOUS | Status: AC
  Administered 2019-11-22: 2 g via INTRAVENOUS
  Filled 2019-11-22: qty 50

## 2019-11-22 NOTE — Progress Notes (Signed)
Encouraged to sit in a chair but claimed not ready yet. Wants to sit in a chair later.

## 2019-11-22 NOTE — Progress Notes (Signed)
PROGRESS NOTE    Nancy Hawkins  LPF:[id number] DOB: Dec 13, 1940 DOA: 11/17/2019 PCP: Lilian Coma., MD   Brief Narrative:  HPI on 11/17/2019 by Dr. Burnadette Peter is a 79 y.o. female with medical history significant of HTN and HLD who presents for worsening intermittent abdominal and back pain. She states she has had intermittent abdominal pain and low back pain for about 1 months. She has seen her primary care doctor a couple of times since the pain began. At one point she was diagnosed with diabetes and prescribed Metformin (which she did not tolerate and stopped taking), she was thought to have GERD and given a trial of PPI, which has not helped her symptoms. The pain has remained intermittent, but has progressively worsened to a 10/10 last night, prompting her to visit urgent care leading to a subsequent transfer to the ED. she does not report specific aggravating aggravating or alleviating factors.  Pain at times feels like it is radiating around both sides to her back. she has intermittent associated nausea without vomiting but does not necessarily correlate with her intermittent pain.  She also reports a degree of constipation for the last several days.  She denies vomiting, diarrhea, chest pain, shortness of breath.  She has never had a colonoscopy.  Interim history Patient admitted with DKA, was placed on insulin drip, which is now completed.  She is now on sliding scale.  She was also noted to have pancreatic mass/inflammation.  Gastroenterology was consulted, s/p colonoscopy and EUS, pending biopsy/FNA results. Advancing diet. 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  Question pseudocyst versus IPMNs. -Placed on IV fluids and currently n.p.o -lipase and LFTs normal -CA 19-9 elevated at 228 -Gastroenterology consulted and appreciated.  -S/p colonoscopy which showed 5 polyps ranging from 3 to 2 mm, removed with a cold snare.  2 colonic angiectasias, nonbleeding nonthrombosed external and internal hemorrhoids -Status post EGD/EUS: White nummular lesions in the esophageal mucosa, gastritis.  Hematin in the entire stomach.  One gastric polyp resected and retrieved, clips were placed.  Pancreatic body lesion.  Cystic lesion seen in the pancreatic head. -GI recommended PPI 40 mg twice daily for 1 month, then 40 mg thereafter -cytology and surgical pathology  -patient able to tolerate soft diet  Back and abdominal pain -Likely secondary to the above, continue pain control with IV Dilaudid and scheduled oxycodone  Essential hypertension -BP currently stable, continue Avapro, Cardizem  Focal narrowing of ascending colon -Radiology read notes this may be due to peristalsis however patient has never had a colonoscopy  Hypokalemia/hypomagnesemia -Aggressively treated potassium, now up to 4.1, however magnesium still 1.7 -Will continue to replace magnesium and monitor  DVT Prophylaxis SCDs  Code Status: Full  Family Communication: None at bedside  Disposition Plan:  Status is: Inpatient  Remains inpatient appropriate because:Persistent severe electrolyte disturbances, Ongoing active pain requiring inpatient pain management, Ongoing diagnostic testing needed not appropriate for outpatient work up and Inpatient level of care appropriate due to severity of illness.  Continue electrolyte replacement.  Currently on soft diet. Pending further GI recommendations.   Dispo: The patient  is from: Home              Anticipated d/c is to: Home               Anticipated d/c date is: 2 days              Patient currently is not medically stable to d/c.  Consultants Gastroenterology  Procedures  MRCP Colonoscopy EUS and EGD  Antibiotics   Anti-infectives (From admission, onward)   Start     Dose/Rate Route Frequency Ordered Stop   11/21/19 2200  ciprofloxacin (CIPRO) IVPB 400 mg  Status:  Discontinued        400 mg 200 mL/hr over 60 Minutes Intravenous Every 12 hours 11/20/19 1503 11/21/19 1351   11/21/19 2000  ciprofloxacin (CIPRO) tablet 500 mg  Status:  Discontinued        500 mg Oral 2 times daily 11/21/19 1351 11/21/19 1424   11/21/19 2000  ciprofloxacin (CIPRO) tablet 500 mg        500 mg Oral 2 times daily 11/21/19 1424 11/23/19 1959      Subjective:   Nancy Hawkins seen and examined today.  Continues to have some intermittent back and abdominal pain.  Was able to tolerate soft diet yesterday evening.  Has not had breakfast yet this morning.  Denies current chest pain or shortness of breath, nausea or vomiting, dizziness or headache.  Objective:   Vitals:   11/21/19 2020 11/21/19 2323 11/22/19 0435 11/22/19 0739  BP: (!) 116/52 (!) 114/56 (!) 120/52 122/60  Pulse: 85 89 73 82  Resp: 12 18 15 11   Temp: 98.5 F (36.9 C) 98.6 F (37 C) 99.1 F (37.3 C) 98.7 F (37.1 C)  TempSrc: Oral Oral Oral Oral  SpO2: 95% 96% 95% 93%  Weight:      Height:        Intake/Output Summary (Last 24 hours) at 11/22/2019 1102 Last data filed at 11/22/2019 1000 Gross per 24 hour  Intake 1563.68 ml  Output --  Net 1563.68 ml   Filed Weights   11/17/19 1003 11/18/19 0025 11/18/19 2047  Weight: 65.3 kg 63 kg 66 kg   Exam  General: Well developed, well nourished, NAD, appears stated age  37: NCAT, mucous membranes moist.   Cardiovascular: S1 S2 auscultated, RRR, no murmur  Respiratory: Clear to auscultation bilaterally with equal chest rise  Abdomen: Soft, mild TTP, nondistended, + bowel sounds  Extremities: warm  dry without cyanosis clubbing or edema  Neuro: AAOx3, nonfocal  Psych: Pleasant, appropriate mood and affect    Data Reviewed: I have personally reviewed following labs and imaging studies  CBC: Recent Labs  Lab 11/17/19 1013 11/17/19 1013 11/17/19 1523 11/20/19 0041 11/20/19 1207 11/21/19 0037 11/22/19 0101  WBC 15.2*  --   --  13.7*  --  11.6* 14.9*  HGB 12.3   < > 13.6 10.7* 11.2* 10.2* 9.6*  HCT 39.1   < > 40.0 31.6* 33.0* 30.0* 28.7*  MCV 90.5  --   --  84.9  --  83.6 85.7  PLT 287  --   --  233  --  210 245   < > = values in this interval not displayed.   Basic Metabolic Panel: Recent Labs  Lab 11/18/19 1310 11/18/19 1310 11/19/19 0052 11/20/19 0041 11/20/19 1207 11/21/19 0037 11/22/19 0101  NA 136   < > 135 134* 139 134* 134*  K 3.0*   < > 2.5* 2.8* 2.8*  2.8* 4.1  CL 108   < > 105 105 104 104 103  CO2 17*  --  19* 18*  --  19* 24  GLUCOSE 136*   < > 127* 243* 181* 160* 186*  BUN 7*   < > 7* 7* 6* 5* 6*  CREATININE 0.54   < > 0.46 0.51 0.30* 0.50 0.38*  CALCIUM 9.1  --  8.6* 8.1*  --  7.4* 7.5*  MG  --   --  1.5* 1.5*  --  1.7 1.7   < > = values in this interval not displayed.   GFR: Estimated Creatinine Clearance: 53.3 mL/min (A) (by C-G formula based on SCr of 0.38 mg/dL (L)). Liver Function Tests: Recent Labs  Lab 11/17/19 1013  AST 9*  ALT 16  ALKPHOS 111  BILITOT 0.6  PROT 7.0  ALBUMIN 2.9*   Recent Labs  Lab 11/17/19 1013  LIPASE 34   No results for input(s): AMMONIA in the last 168 hours. Coagulation Profile: No results for input(s): INR, PROTIME in the last 168 hours. Cardiac Enzymes: No results for input(s): CKTOTAL, CKMB, CKMBINDEX, TROPONINI in the last 168 hours. BNP (last 3 results) No results for input(s): PROBNP in the last 8760 hours. HbA1C: No results for input(s): HGBA1C in the last 72 hours. CBG: Recent Labs  Lab 11/21/19 1605 11/21/19 2124 11/22/19 0204 11/22/19 0615 11/22/19 1100  GLUCAP 153* 167* 190* 179*  128*   Lipid Profile: No results for input(s): CHOL, HDL, LDLCALC, TRIG, CHOLHDL, LDLDIRECT in the last 72 hours. Thyroid Function Tests: No results for input(s): TSH, T4TOTAL, FREET4, T3FREE, THYROIDAB in the last 72 hours. Anemia Panel: No results for input(s): VITAMINB12, FOLATE, FERRITIN, TIBC, IRON, RETICCTPCT in the last 72 hours. Urine analysis:    Component Value Date/Time   COLORURINE YELLOW 11/17/2019 1337   APPEARANCEUR CLEAR 11/17/2019 1337   LABSPEC 1.024 11/17/2019 1337   PHURINE 5.0 11/17/2019 1337   GLUCOSEU >=500 (A) 11/17/2019 1337   HGBUR NEGATIVE 11/17/2019 1337   BILIRUBINUR NEGATIVE 11/17/2019 1337   BILIRUBINUR small (A) 11/17/2019 0906   KETONESUR 80 (A) 11/17/2019 1337   PROTEINUR 30 (A) 11/17/2019 1337   UROBILINOGEN 0.2 11/17/2019 0906   NITRITE NEGATIVE 11/17/2019 1337   LEUKOCYTESUR NEGATIVE 11/17/2019 1337   Sepsis Labs: @LABRCNTIP (procalcitonin:4,lacticidven:4)  ) Recent Results (from the past 240 hour(s))  Respiratory Panel by RT PCR (Flu A&B, Covid) - Nasopharyngeal Swab     Status: None   Collection Time: 11/17/19  5:35 PM   Specimen: Nasopharyngeal Swab  Result Value Ref Range Status   SARS Coronavirus 2 by RT PCR NEGATIVE NEGATIVE Final    Comment: (NOTE) SARS-CoV-2 target nucleic acids are NOT DETECTED.  The SARS-CoV-2 RNA is generally detectable in upper respiratoy specimens during the acute phase of infection. The lowest concentration of SARS-CoV-2 viral copies this assay can detect is 131 copies/mL. A negative result does not preclude SARS-Cov-2 infection and should not be used as the sole basis for treatment or other patient management decisions. A negative result may occur with  improper specimen collection/handling, submission of specimen other than nasopharyngeal swab, presence of viral mutation(s) within the areas targeted by this assay, and inadequate number of viral copies (<131 copies/mL). A negative result must be  combined with clinical observations, patient history, and epidemiological information. The expected result is Negative.  Fact Sheet for Patients:  PinkCheek.be  Fact Sheet for Healthcare Providers:  GravelBags.it  This test is no t yet approved or cleared  by the [place] and  has been authorized for detection and/or diagnosis of SARS-CoV-2 by FDA under an Emergency Use Authorization (EUA). This EUA will remain  in effect (meaning this test can be used) for the duration of the COVID-19 declaration under Section 564(b)(1) of the Act, 21 U.S.C. section 360bbb-3(b)(1), unless the authorization is terminated or revoked sooner.     Influenza A by PCR NEGATIVE NEGATIVE Final   Influenza B by PCR NEGATIVE NEGATIVE Final    Comment: (NOTE) The Xpert Xpress SARS-CoV-2/FLU/RSV assay is intended as an aid in  the diagnosis of influenza from Nasopharyngeal swab specimens and  should not be used as a sole basis for treatment. Nasal washings and  aspirates are unacceptable for Xpert Xpress SARS-CoV-2/FLU/RSV  testing.  Fact Sheet for Patients: PinkCheek.be  Fact Sheet for Healthcare Providers: GravelBags.it  This test is not yet approved or cleared by the [place] FDA and  has been authorized for detection and/or diagnosis of SARS-CoV-2 by  FDA under an Emergency Use Authorization (EUA). This EUA will remain  in effect (meaning this test can be used) for the duration of the  Covid-19 declaration under Section 564(b)(1) of the Act, 21  U.S.C. section 360bbb-3(b)(1), unless the authorization is  terminated or revoked. Performed at [facility], [place] [address], [place], [place] [address]   MRSA PCR Screening     Status: None   Collection Time: 11/18/19  8:56 PM   Specimen: Nasal Mucosa; Nasopharyngeal  Result Value Ref Range Status   MRSA by PCR NEGATIVE  NEGATIVE Final    Comment:        The GeneXpert MRSA Assay (FDA approved for NASAL specimens only), is one component of a comprehensive MRSA colonization surveillance program. It is not intended to diagnose MRSA infection nor to guide or monitor treatment for MRSA infections. Performed at [facility], [place] [address], [place], [place] [address]       Radiology Studies: No results found.   Scheduled Meds: . ciprofloxacin  500 mg Oral BID  . diltiazem  120 mg Oral Daily  . docusate sodium  100 mg Oral Daily  . insulin aspart  0-15 Units Subcutaneous TID AC & HS  . insulin aspart  2 Units Subcutaneous TID WC  . insulin glargine  8 Units Subcutaneous Q24H  . irbesartan  75 mg Oral Daily  . oxyCODONE  15 mg Oral Q12H  . pantoprazole  40 mg Oral BID AC   Continuous Infusions: . lactated ringers 75 mL/hr at 11/22/19 1000  . magnesium sulfate bolus IVPB       LOS: 5 days   Time Spent in minutes   30 minutes  Stalin Gruenberg D.O. on 11/22/2019 at 11:02 AM  Between 7am to 7pm - Please see pager noted on [email or web address]  After 7pm go to [email or web address]  And look for the night coverage person covering for me after hours  [facility] Office  [phone]

## 2019-11-22 NOTE — Progress Notes (Signed)
Daily Rounding Note  11/22/2019, 11:28 AM  LOS: 5 days   SUBJECTIVE:   Chief complaint: Back and abdominal pain.  Lesions in pancreas.  Colon polyps.     The severe back pain she was having yesterday is much better today. Abdominal pain is minimal. A little bit of nausea but tolerating solid food. No stools but passing flatus.   OBJECTIVE:         Vital signs in last 24 hours:    Temp:  [98.2 F (36.8 C)-99.1 F (37.3 C)] 98.7 F (37.1 C) (10/15 1048) Pulse Rate:  [73-89] 87 (10/15 1048) Resp:  [11-20] 15 (10/15 1048) BP: (114-129)/(52-60) 114/55 (10/15 1048) SpO2:  [93 %-97 %] 97 % (10/15 1048) Last BM Date:  (Pt unsure of last BM) Filed Weights   11/17/19 1003 11/18/19 0025 11/18/19 2047  Weight: 65.3 kg 63 kg 66 kg   General: Looks much more comfortable than yesterday. Does appear a bit weak and tired. Heart: RRR Chest: No labored breathing or cough. Abdomen: Soft. Minimal upper abdominal tenderness no guarding or rebound. Extremities: No CCE. Neuro/Psych: Alert. Appropriate oriented x3. In the midst of eating her lunch.  Intake/Output from previous day: 10/14 0701 - 10/15 0700 In: 1888.7 [P.O.:100; I.V.:1388.7; IV Piggyback:400] Out: -   Intake/Output this shift: Total I/O In: 325 [P.O.:100; I.V.:225] Out: -   Lab Results: Recent Labs    11/20/19 0041 11/20/19 0041 11/20/19 1207 11/21/19 0037 11/22/19 0101  WBC 13.7*  --   --  11.6* 14.9*  HGB 10.7*   < > 11.2* 10.2* 9.6*  HCT 31.6*   < > 33.0* 30.0* 28.7*  PLT 233  --   --  210 245   < > = values in this interval not displayed.   BMET Recent Labs    11/20/19 0041 11/20/19 0041 11/20/19 1207 11/21/19 0037 11/22/19 0101  NA 134*   < > 139 134* 134*  K 2.8*   < > 2.8* 2.8* 4.1  CL 105   < > 104 104 103  CO2 18*  --   --  19* 24  GLUCOSE 243*   < > 181* 160* 186*  BUN 7*   < > 6* 5* 6*  CREATININE 0.51   < > 0.30* 0.50 0.38*    CALCIUM 8.1*  --   --  7.4* 7.5*   < > = values in this interval not displayed.   LFT No results for input(s): PROT, ALBUMIN, AST, ALT, ALKPHOS, BILITOT, BILIDIR, IBILI in the last 72 hours. PT/INR No results for input(s): LABPROT, INR in the last 72 hours. Hepatitis Panel No results for input(s): HEPBSAG, HCVAB, HEPAIGM, HEPBIGM in the last 72 hours.  Studies/Results: No results found.   Pathology:  A. COLON, ILEOCECAL VALVE, ASCENDING, HEPATIC FLEXURE AND TRANSVERSE,  POLYPECTOMY:  - Tubular adenoma (x multiple).  - Negative for high grade dysplasia.  B. DUODENUM, BIOPSY:  - Duodenal mucosa with no significant pathologic findings.  - Negative for increased intraepithelial lymphocytes and villous  architectural changes.  C. STOMACH, BIOPSY:  - Gastric antral mucosa with mild reactive gastropathy.  - Gastric oxyntic mucosa with mild chronic gastritis.  - Warthin-Starry stain is negative for Helicobacter pylori.  D. ESOPHAGUS, BIOPSY:  - Squamous esophageal epithelium with no significant pathologic  findings.  - Negative for increased intraepithelial eosinophils.  - PAS/F stain is negative for fungal elements.  E. STOMACH, ENDOSCOPIC MUCOSALRESECTION OF POLYP:  -  Hyperplastic polyp.  ASSESMENT:   *   Back and abdominal pain Imaging w CT and MRI/MRCP showing 3.2 by 1.7 cm pancreatic pseudocyst And other smaller cystic lesions ? Pseudocysts vs IPMNs. PD dilated centrally. No malignant lesions. Lipase and LFTs normal. CA 19-9 is 228. 10/13 EGD/EUS.  White lesions in esophagus, biopsied for Candida (path: No significant pathologic findings, no increase in intraepithelial eosinophils and stains negative for fungal elements).  Blood/coffee ground material throughout the stomach. Solitary nonbleeding gastric polyp (hyperplastic)  resected/retrieved via mucosal resection and site endoclipped.  Gastric mucosa normal but biopsied for H. Pylori (path: reactive gastropathy, no h  pylori) Hyperechoic, heterogeneous, possible cystic component to 27 x 19 mm diameter lesion at the pancreatic body.  Poorly defined borders and suggestion this abuts the splenic artery.  Pancreatic duct dilated in the region of the head and neck decompressed to more normal diameter towards the tail.  Pancreatic parenchyma lobular, concerning for active/recent pancreatitis.  FNA sampling x6 passes obtained from pancreatic lesion.  Separate lesion suggestive of cyst seen at pancreatic head, not in obvious communication with pancreatic duct, measuring 20 x 7 mm, CBD 4.7 mm, CHD 8.7 mm and no abnormal appearance to either duct. Back and abd pain better.    *   Narrowing at transverse colon on CT  11/20/2019 colonoscopy.  Tortuous colon with stool throughout colon lavaged to achieve adequate visualization.  Five, 3 to 14 mm, polyps removed throughout the colon.  2, nonbleeding, angioectasias not treated.  Pandiverticulosis.  Nonbleeding external, internal hemorrhoids. All polyps TAs w/o HGD.     PLAN   *   Await cytology, lab thinks this should be available by late today.    *    Continue the OxyContin which appears to be effective. Continue Protonix 40 mg p.o. q day.        Azucena Freed  11/22/2019, 11:28 AM Phone [phone]

## 2019-11-23 ENCOUNTER — Inpatient Hospital Stay (HOSPITAL_COMMUNITY): Payer: Medicare Other

## 2019-11-23 DIAGNOSIS — K8689 Other specified diseases of pancreas: Secondary | ICD-10-CM | POA: Diagnosis not present

## 2019-11-23 DIAGNOSIS — E0810 Diabetes mellitus due to underlying condition with ketoacidosis without coma: Secondary | ICD-10-CM | POA: Diagnosis not present

## 2019-11-23 DIAGNOSIS — I10 Essential (primary) hypertension: Secondary | ICD-10-CM | POA: Diagnosis not present

## 2019-11-23 DIAGNOSIS — K859 Acute pancreatitis without necrosis or infection, unspecified: Secondary | ICD-10-CM | POA: Diagnosis not present

## 2019-11-23 LAB — BASIC METABOLIC PANEL
Anion gap: 10 (ref 5–15)
BUN: 6 mg/dL — ABNORMAL LOW (ref 8–23)
CO2: 22 mmol/L (ref 22–32)
Calcium: 7.6 mg/dL — ABNORMAL LOW (ref 8.9–10.3)
Chloride: 100 mmol/L (ref 98–111)
Creatinine, Ser: 0.41 mg/dL — ABNORMAL LOW (ref 0.44–1.00)
GFR, Estimated: >60 mL/min (ref >60)
Glucose, Bld: 144 mg/dL — ABNORMAL HIGH (ref 70–99)
Potassium: 3.5 mmol/L (ref 3.5–5.1)
Sodium: 132 mmol/L — ABNORMAL LOW (ref 135–145)

## 2019-11-23 LAB — MAGNESIUM: Magnesium: 1.8 mg/dL (ref 1.7–2.4)

## 2019-11-23 LAB — GLUCOSE, CAPILLARY
Glucose-Capillary: 103 mg/dL — ABNORMAL HIGH (ref 70–99)
Glucose-Capillary: 122 mg/dL — ABNORMAL HIGH (ref 70–99)
Glucose-Capillary: 171 mg/dL — ABNORMAL HIGH (ref 70–99)
Glucose-Capillary: 182 mg/dL — ABNORMAL HIGH (ref 70–99)
Glucose-Capillary: 216 mg/dL — ABNORMAL HIGH (ref 70–99)

## 2019-11-23 LAB — CBC
HCT: 29.7 % — ABNORMAL LOW (ref 36.0–46.0)
Hemoglobin: 9.6 g/dL — ABNORMAL LOW (ref 12.0–15.0)
MCH: 27.6 pg (ref 26.0–34.0)
MCHC: 32.3 g/dL (ref 30.0–36.0)
MCV: 85.3 fL (ref 80.0–100.0)
Platelets: 248 10*3/uL (ref 150–400)
RBC: 3.48 MIL/uL — ABNORMAL LOW (ref 3.87–5.11)
RDW: 14.4 % (ref 11.5–15.5)
WBC: 14.8 10*3/uL — ABNORMAL HIGH (ref 4.0–10.5)
nRBC: 0 % (ref 0.0–0.2)

## 2019-11-23 IMAGING — DX DG ABDOMEN 1V
2 series · 2 of 2 positions shown · non-contrast
Comparison: CT dated [DATE].

CLINICAL DATA: No bowel movement.  Unable to keep food down.

EXAM:
ABDOMEN - 1 VIEW

[abdomen kub (1 of 2)]
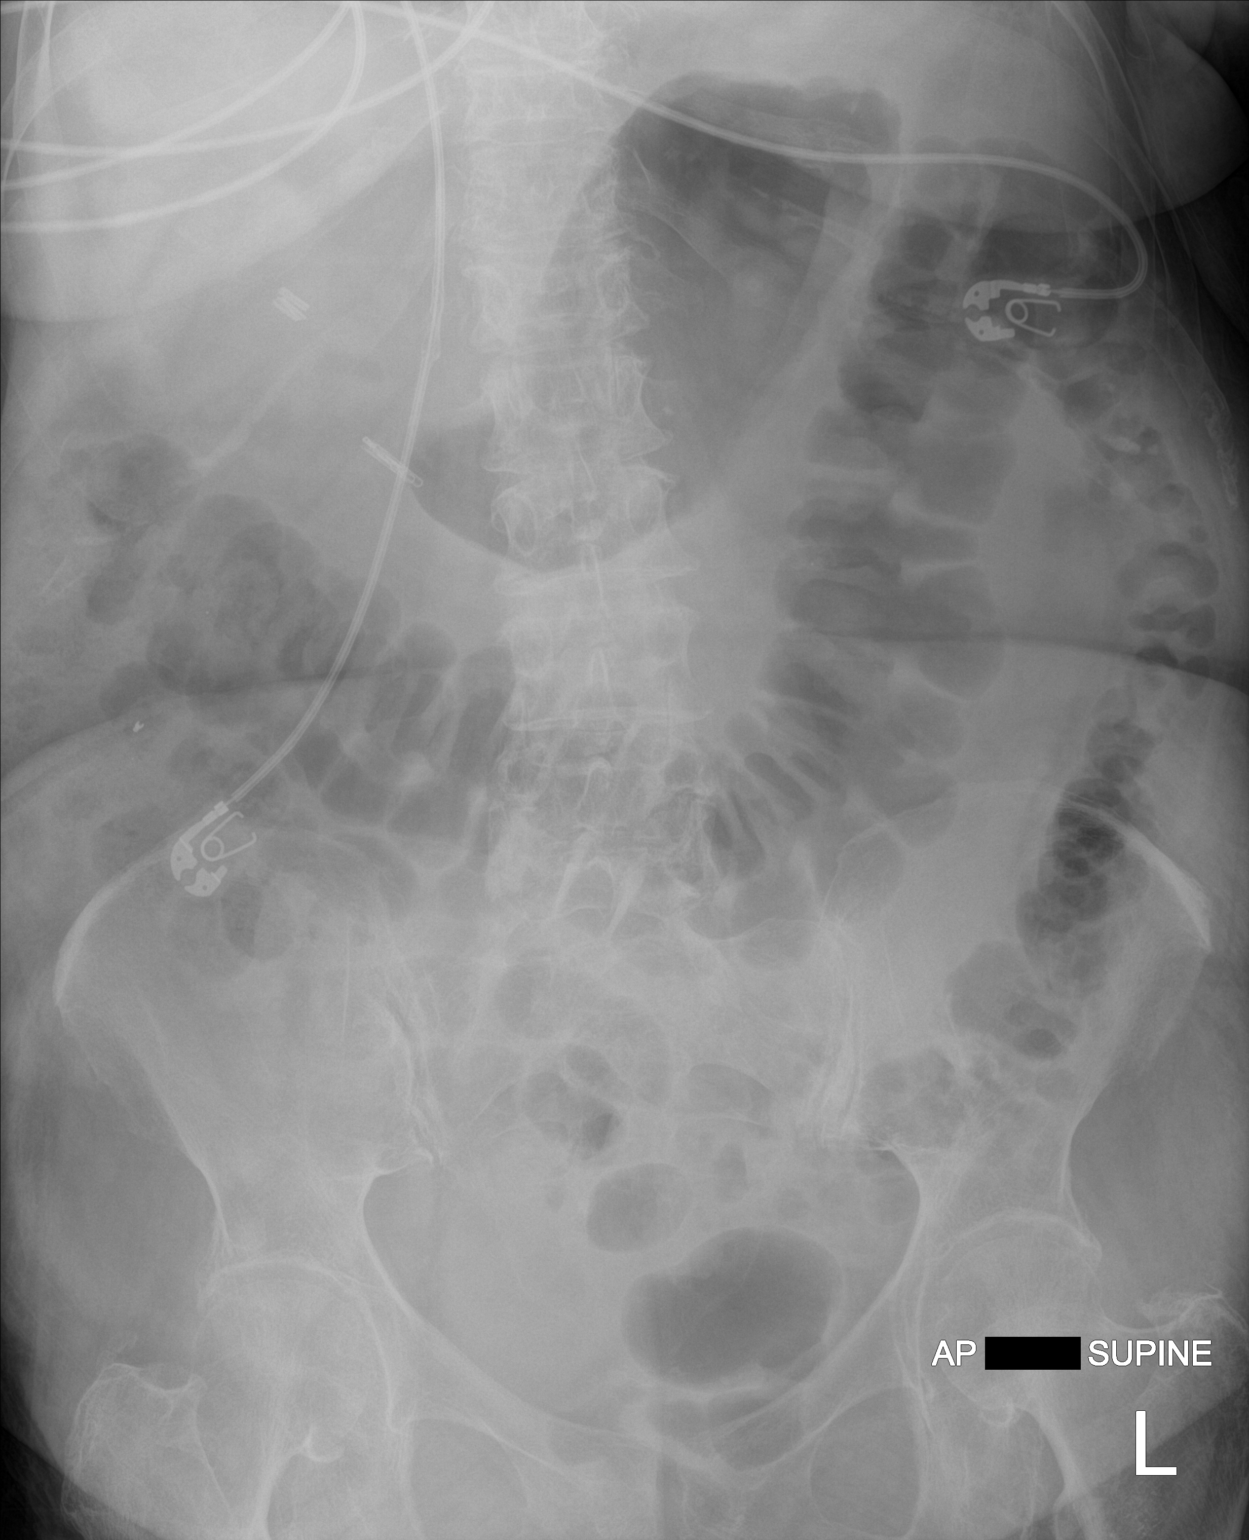

[abdomen kub (2 of 2)]
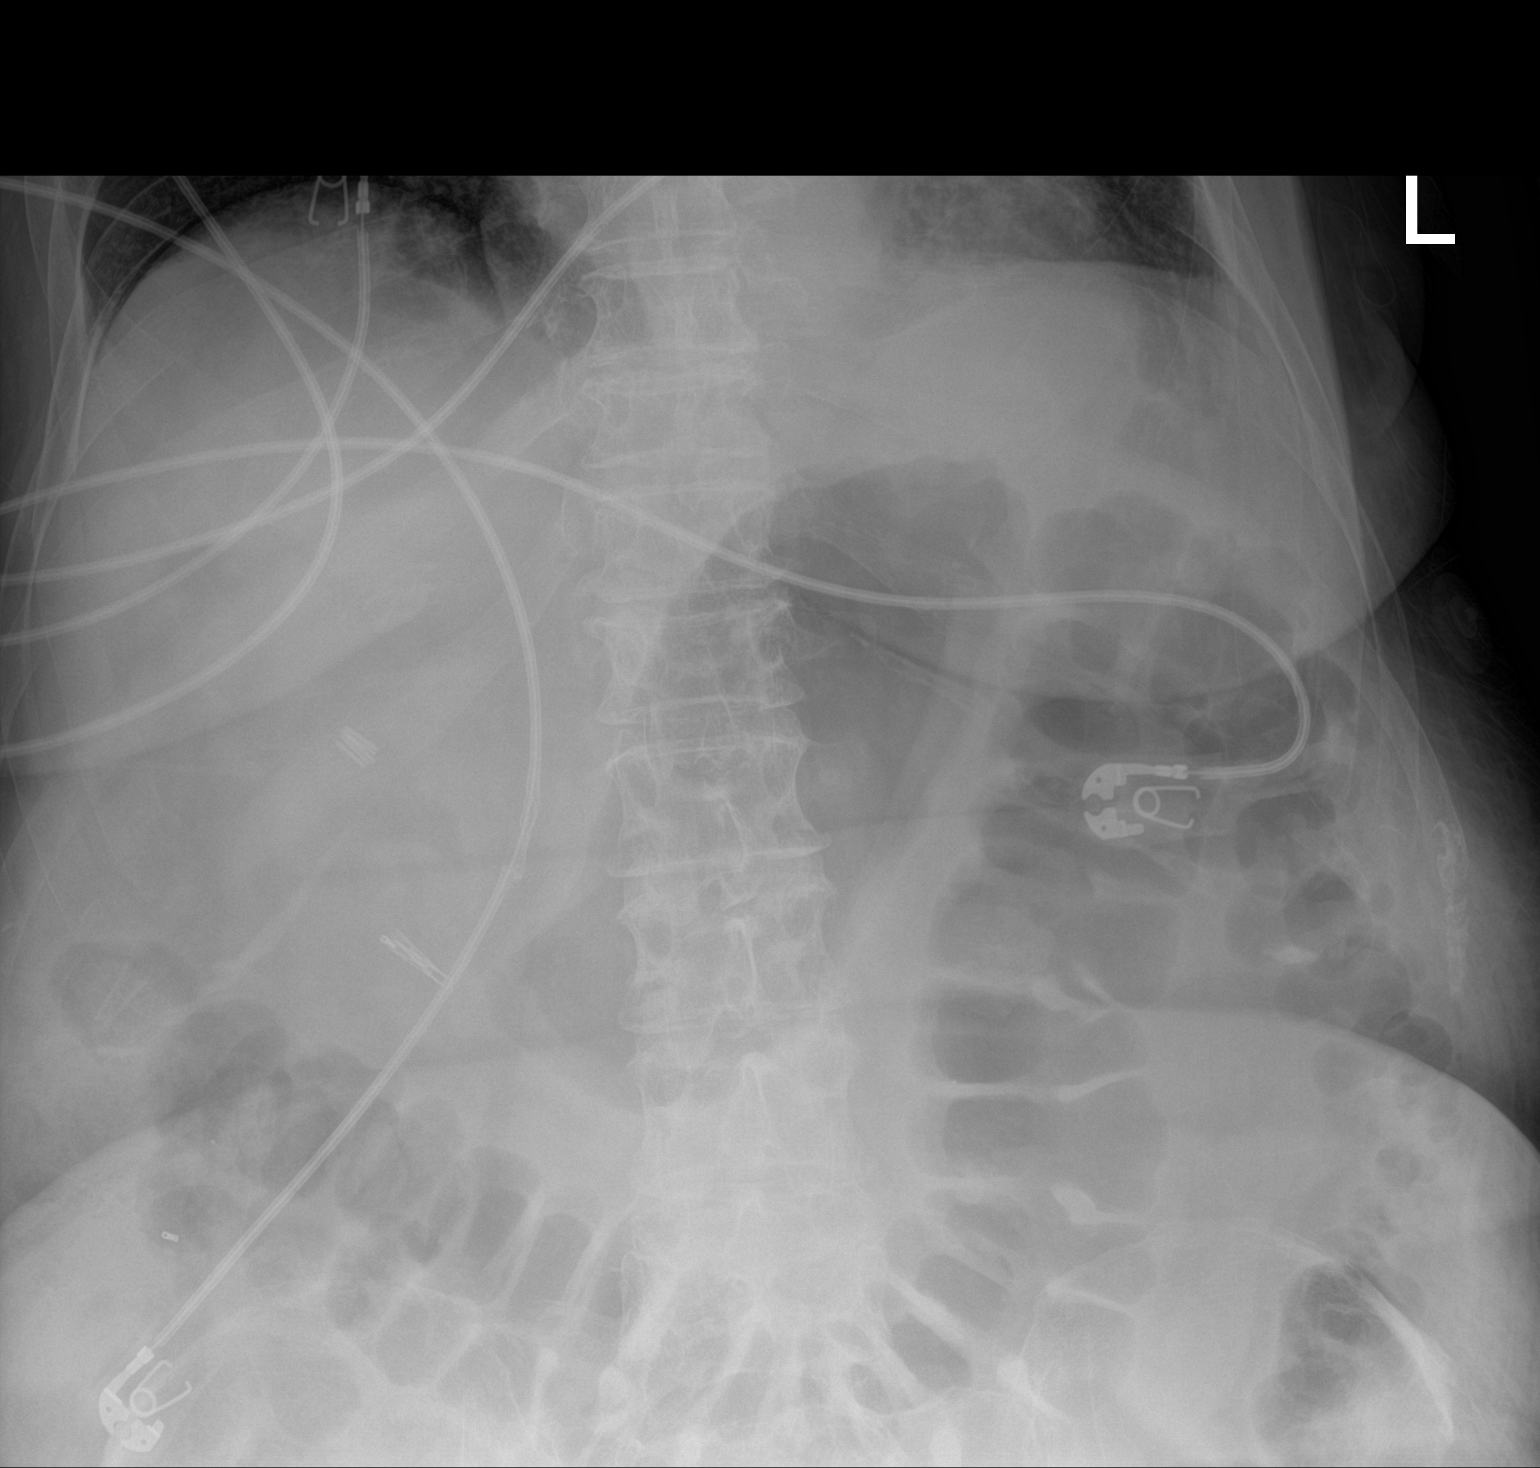

[2 of 2 positions shown; findings below may reference images not displayed]

FINDINGS: Normal bowel gas pattern. Status post cholecystectomy. No evidence
of a renal or ureteral stone.

Depression of the upper endplate of L1. This is stable from the
prior CT consistent with a mild chronic compression fracture.
IMPRESSION: 1. No acute findings.
2. Normal bowel gas pattern.

## 2019-11-23 MED ORDER — ONDANSETRON HCL 4 MG/2ML IJ SOLN
4.0000 mg | Freq: Four times a day (QID) | INTRAMUSCULAR | Status: AC
  Administered 2019-11-23 – 2019-11-25 (×8): 4 mg via INTRAVENOUS
  Filled 2019-11-23 (×8): qty 2

## 2019-11-23 MED ORDER — OXYCODONE HCL 5 MG PO TABS
5.0000 mg | ORAL_TABLET | ORAL | Status: DC | PRN
  Administered 2019-11-23 – 2019-11-25 (×2): 5 mg via ORAL
  Filled 2019-11-23 (×2): qty 1

## 2019-11-23 MED ORDER — MAGNESIUM SULFATE 4 GM/100ML IV SOLN
4.0000 g | Freq: Once | INTRAVENOUS | Status: AC
  Administered 2019-11-23: 4 g via INTRAVENOUS
  Filled 2019-11-23: qty 100

## 2019-11-23 MED ORDER — POTASSIUM CHLORIDE CRYS ER 20 MEQ PO TBCR
40.0000 meq | EXTENDED_RELEASE_TABLET | Freq: Once | ORAL | Status: AC
  Administered 2019-11-23: 40 meq via ORAL
  Filled 2019-11-23: qty 2

## 2019-11-23 MED ORDER — BISACODYL 10 MG RE SUPP
10.0000 mg | Freq: Once | RECTAL | Status: DC
  Filled 2019-11-23: qty 1

## 2019-11-23 MED ORDER — TRAMADOL HCL 50 MG PO TABS
50.0000 mg | ORAL_TABLET | Freq: Four times a day (QID) | ORAL | Status: DC | PRN
  Administered 2019-11-23 – 2019-11-28 (×10): 50 mg via ORAL
  Filled 2019-11-23 (×10): qty 1

## 2019-11-23 MED ORDER — ONDANSETRON HCL 4 MG/2ML IJ SOLN
4.0000 mg | Freq: Four times a day (QID) | INTRAMUSCULAR | Status: DC | PRN
  Administered 2019-11-23 (×2): 4 mg via INTRAVENOUS
  Filled 2019-11-23 (×2): qty 2

## 2019-11-23 NOTE — Plan of Care (Signed)

## 2019-11-23 NOTE — TOC Initial Note (Signed)
Transition of Care [facility]) - Initial/Assessment Note    Patient Details  Name: Nancy Hawkins MRN: [id number] Date of Birth: 09-Jul-1940  [facility] CM/SW Contact:    Verdell Carmine, RN Phone Number: 11/23/2019, 2:24 PM  Clinical Narrative:                 History Reviewed, patient is admitted with abdominal pain, back pain persistent  and DKA.. PT evaluated her nad recommended HH PT. Called her room regarding speaking to her about this, but there was no answer. Will call back later to speak with her again up setting up home health. CM will follow for needs.   Expected Discharge Plan: [place] Barriers to Discharge: Continued Medical Work up   Patient Goals and CMS Choice        Expected Discharge Plan and Services Expected Discharge Plan: [place]   Discharge Planning Services: CM Consult                                          Prior Living Arrangements/Services   Lives with:: Spouse          Need for Family Participation in Patient Care: Yes (Comment) Care giver support system in place?: Yes (comment)   Criminal Activity/Legal Involvement Pertinent to Current Situation/Hospitalization: No - Comment as needed  Activities of Daily Living Home Assistive Devices/Equipment: None ADL Screening (condition at time of admission) Patient's cognitive ability adequate to safely complete daily activities?: Yes Is the patient deaf or have difficulty hearing?: No Does the patient have difficulty seeing, even when wearing glasses/contacts?: No Does the patient have difficulty concentrating, remembering, or making decisions?: No Patient able to express need for assistance with ADLs?: Yes Does the patient have difficulty dressing or bathing?: No Independently performs ADLs?: Yes (appropriate for developmental age) Does the patient have difficulty walking or climbing stairs?: No Weakness of Legs: None Weakness of  Arms/Hands: None  Permission Sought/Granted                  Emotional Assessment         Alcohol / Substance Use: Not Applicable Psych Involvement: No (comment)  Admission diagnosis:  DKA (diabetic ketoacidosis) ([place]) [E11.10] Pancreatic mass [K86.89] Diabetic ketoacidosis without coma associated with type 1 diabetes mellitus ([place]) [E10.10] Acute pancreatitis, unspecified complication status, unspecified pancreatitis type [K85.90] Patient Active Problem List   Diagnosis Date Noted  . Acute pancreatitis   . Abnormal CT scan, colon   . DKA (diabetic ketoacidosis) ([place]) 11/17/2019  . Pancreatic mass 11/17/2019   PCP:  Lilian Coma., MD Pharmacy:   [id number] [address], [place] [address] Phone: [phone] Fax: [phone]     Social Determinants of Health (SDOH) Interventions    Readmission Risk Interventions No flowsheet data found.

## 2019-11-23 NOTE — Evaluation (Signed)
Physical Therapy Evaluation Patient Details Name: Nancy Hawkins MRN: [id number] DOB: 06-20-1940 Today's Date: 11/23/2019   History of Present Illness  Pt is a 79 y.o. female with PMH significant of HTN and HLD who presents for worsening intermittent abdominal and back pain. She states she has had intermittent abdominal pain and low back pain for about 1 months. The pain has remained intermittent, but has progressively worsened to a 10/10 last night, prompting her to visit urgent care leading to a subsequent transfer to the ED. She was found to be in DKA and was noted to have a pancreatic mass/inflammation.     Clinical Impression  Pt admitted with above diagnosis. Pt currently with functional limitations due to the deficits listed below (see PT Problem List). At the time of PT eval pt was very nauseated and somewhat lethargic. She required cues for eyes open throughout session, and daughter answered most of the history questions. Despite lethargy, pt with good rehab effort, and she was able to stand EOB and take a few steps towards [facility] for repositioning. Pt declined sitting up in the chair due to nausea. Pt and daughter educated about benefits of being OOB to chair, and encouraged her to sit up during attempts to eat/drink, and to ambulate to the bathroom or [facility] whenever possible. Noted pt on RA throughout session and O2 sats decreased to 84-86% during mobility. RN notified. Anticipate when nausea subsides, pt will progress well, however recommend HHPT to follow-up at d/c for return to PLOF. Acutely, pt will benefit from skilled PT to increase their independence and safety with mobility to allow discharge to the venue listed below.       Follow Up Recommendations Home health PT;Supervision/Assistance - 24 hour    Equipment Recommendations  Rolling Pretty with 5" wheels (May progress away from RW by discharge)    Recommendations for Other Services       Precautions / Restrictions  Precautions Precautions: Fall Precaution Comments: Extreme nausea Restrictions Weight Bearing Restrictions: No      Mobility  Bed Mobility Overal bed mobility: Needs Assistance Bed Mobility: Rolling;Sidelying to Sit;Sit to Sidelying Rolling: Modified independent (Device/Increase time) Sidelying to sit: Min assist     Sit to sidelying: Min guard General bed mobility comments: Increased time to roll/position in the bed but able to do so without assist. Light min assist for transition to full sitting position, with min guard assist for LE elevation back up into bed at end of session.   Transfers Overall transfer level: Needs assistance Equipment used: 2 person hand held assist Transfers: Sit to/from Stand Sit to Stand: Min assist         General transfer comment: Assist for power-up to full stand as well as to gain/maintain standing balance. Pt required cues for eyes open throughout OOB mobility.   Ambulation/Gait Ambulation/Gait assistance: Min assist Gait Distance (Feet): 2 Feet Assistive device: 2 person hand held assist Gait Pattern/deviations: Decreased stride length;Step-to pattern;Trunk flexed Gait velocity: Decreased Gait velocity interpretation: <1.31 ft/sec, indicative of household ambulator General Gait Details: Pt took 2-3 side steps up towards [facility] before suddenly sitting back down with uncontrolled descent. Pt fatigued quickly with each step appearing very effortful.   Stairs            Wheelchair Mobility    Modified Rankin (Stroke Patients Only)       Balance Overall balance assessment: Needs assistance Sitting-balance support: Feet supported;No upper extremity supported Sitting balance-Leahy Scale: Fair  Standing balance support: Bilateral upper extremity supported;During functional activity Standing balance-Leahy Scale: Poor Standing balance comment: Reliant on therapist support during standing activity.                               Pertinent Vitals/Pain Pain Assessment: 0-10 Pain Score: 8  Pain Location: middle abdomen.  Pain Descriptors / Indicators: Dull;Aching Pain Intervention(s): Limited activity within patient's tolerance;Monitored during session;Repositioned    Home Living Family/patient expects to be discharged to:: Private residence Living Arrangements: Spouse/significant other;Children Available Help at Discharge: Family;Available 24 hours/day Type of Home: House Home Access: Stairs to enter Entrance Stairs-Rails: Right;Left;Can reach both (in the garage) Entrance Stairs-Number of Steps: 2 in the front, 3 in the garage Home Layout: Two level;Able to live on main level with bedroom/bathroom Home Equipment: Shower seat - built in;Grab bars - toilet;Grab bars - tub/shower      Prior Function Level of Independence: Independent         Comments: Treadmill and stationary bike for exercise. Independent with home management - cooking, cleaning, laundry, drives     Hand Dominance   Dominant Hand: Right    Extremity/Trunk Assessment   Upper Extremity Assessment Upper Extremity Assessment: Defer to OT evaluation    Lower Extremity Assessment Lower Extremity Assessment: Generalized weakness    Cervical / Trunk Assessment Cervical / Trunk Assessment: Kyphotic  Communication   Communication: No difficulties  Cognition Arousal/Alertness: Lethargic;Suspect due to medications Behavior During Therapy: [facility] for tasks assessed/performed Overall Cognitive Status: Within Functional Limits for tasks assessed                                        General Comments      Exercises     Assessment/Plan    PT Assessment Patient needs continued PT services  PT Problem List Decreased strength;Decreased activity tolerance;Decreased balance;Decreased mobility;Decreased knowledge of use of DME;Decreased safety awareness;Decreased knowledge of precautions;Pain       PT Treatment  Interventions DME instruction;Gait training;Stair training;Functional mobility training;Therapeutic activities;Therapeutic exercise;Neuromuscular re-education;Patient/family education    PT Goals (Current goals can be found in the Care Plan section)  Acute Rehab PT Goals Patient Stated Goal: Feel better PT Goal Formulation: With patient/family Time For Goal Achievement: 11/30/19 Potential to Achieve Goals: Good    Frequency Min 3X/week   Barriers to discharge        Co-evaluation               AM-PAC PT "6 Clicks" Mobility  Outcome Measure Help needed turning from your back to your side while in a flat bed without using bedrails?: None Help needed moving from lying on your back to sitting on the side of a flat bed without using bedrails?: A Little Help needed moving to and from a bed to a chair (including a wheelchair)?: A Little Help needed standing up from a chair using your arms (e.g., wheelchair or bedside chair)?: A Little Help needed to walk in hospital room?: A Little Help needed climbing 3-5 steps with a railing? : A Lot 6 Click Score: 18    End of Session   Activity Tolerance: Patient limited by fatigue;Patient limited by lethargy;Patient limited by pain (Nausea) Patient left: in bed;with call bell/phone within reach;with family/visitor present Nurse Communication: Mobility status;Other (comment) (O2 sats dropped to 84-86% on RA during mobility) PT  Visit Diagnosis: Unsteadiness on feet (R26.81);Muscle weakness (generalized) (M62.81);Pain Pain - part of body:  (abdomen)    Time: 6286-3817 PT Time Calculation (min) (ACUTE ONLY): 23 min   Charges:   PT Evaluation $PT Eval Moderate Complexity: 1 Mod PT Treatments $Gait Training: 8-22 mins        Rolinda Roan, PT, DPT Acute Rehabilitation Services Pager: [phone] Office: [phone]   Thelma Comp 11/23/2019, 11:39 AM

## 2019-11-23 NOTE — Progress Notes (Signed)
PROGRESS NOTE    Nancy Hawkins  FFM:[id number] DOB: 1941-01-24 DOA: 11/17/2019 PCP: Lilian Coma., MD   Brief Narrative:  HPI on 11/17/2019 by Dr. Burnadette Peter is a 79 y.o. female with medical history significant of HTN and HLD who presents for worsening intermittent abdominal and back pain. She states she has had intermittent abdominal pain and low back pain for about 1 months. She has seen her primary care doctor a couple of times since the pain began. At one point she was diagnosed with diabetes and prescribed Metformin (which she did not tolerate and stopped taking), she was thought to have GERD and given a trial of PPI, which has not helped her symptoms. The pain has remained intermittent, but has progressively worsened to a 10/10 last night, prompting her to visit urgent care leading to a subsequent transfer to the ED. she does not report specific aggravating aggravating or alleviating factors.  Pain at times feels like it is radiating around both sides to her back. she has intermittent associated nausea without vomiting but does not necessarily correlate with her intermittent pain.  She also reports a degree of constipation for the last several days.  She denies vomiting, diarrhea, chest pain, shortness of breath.  She has never had a colonoscopy.  Interim history Patient admitted with DKA, was placed on insulin drip, which is now completed.  She is now on sliding scale.  She was also noted to have pancreatic mass/inflammation.  Gastroenterology was consulted, s/p colonoscopy and EUS. Advancing diet and attempting to control pain.  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  Question pseudocyst versus IPMNs. -Placed on IV fluids and currently n.p.o -lipase and LFTs normal -CA 19-9 elevated at 228 -Gastroenterology consulted and appreciated.  -S/p colonoscopy which showed 5 polyps ranging from 3 to 2 mm, removed with a cold snare.  2 colonic angiectasias, nonbleeding nonthrombosed external and internal hemorrhoids -Status post EGD/EUS: White nummular lesions in the esophageal mucosa, gastritis.  Hematin in the entire stomach.  One gastric polyp resected and retrieved, clips were placed.  Pancreatic body lesion.  Cystic lesion seen in the pancreatic head. -GI recommended PPI 40 mg twice daily for 1 month, then 40 mg thereafter -cytology and surgical pathology appear to be benign -patient able to tolerate soft diet- but complains of nausea -Phenergan has been added -Gastroenterology has scheduled Zofran for the next 24 hours as well as discuss possibility of NG tube placement for feeding but would continue to monitor for now -Discussed with gastroenterology, patient will likely need repeat EUS however will reassess on 11/25/2019 for further planning  Back and abdominal pain -Likely secondary to the above, continue pain control with IV Dilaudid and as needed oxycodone  Essential hypertension -BP currently stable, continue Avapro, Cardizem  Focal narrowing of ascending colon -Radiology read notes this may be due to peristalsis however patient has never had a colonoscopy  Hypokalemia/hypomagnesemia -After aggressively replacing potassium, currently at 3.5, magnesium at 1.8 -Will continue to replace and monitor  DVT Prophylaxis SCDs  Code Status: Full  Family Communication: None at bedside  Disposition Plan:  Status is: Inpatient  Remains inpatient appropriate because:Persistent severe electrolyte disturbances, Ongoing active pain  requiring inpatient pain management, Ongoing diagnostic testing needed not appropriate for outpatient work up and Inpatient level of care appropriate due to severity of illness.  Continue electrolyte replacement.  Currently on soft diet. Pending further GI recommendations.   Dispo: The patient is from: Home              Anticipated d/c is to: Home              Anticipated d/c date is: TBD              Patient currently is not medically stable to d/c.  Consultants Gastroenterology  Procedures  MRCP Colonoscopy EUS and EGD  Antibiotics   Anti-infectives (From admission, onward)   Start     Dose/Rate Route Frequency Ordered Stop   11/21/19 2200  ciprofloxacin (CIPRO) IVPB 400 mg  Status:  Discontinued        400 mg 200 mL/hr over 60 Minutes Intravenous Every 12 hours 11/20/19 1503 11/21/19 1351   11/21/19 2000  ciprofloxacin (CIPRO) tablet 500 mg  Status:  Discontinued        500 mg Oral 2 times daily 11/21/19 1351 11/21/19 1424   11/21/19 2000  ciprofloxacin (CIPRO) tablet 500 mg        500 mg Oral 2 times daily 11/21/19 1424 11/23/19 4098      Subjective:   Nancy Hawkins seen and examined today.  Continues to have some nausea.  States that when she moves she feels more nauseous.  She has been able to tolerate some food however does feel nauseous after work.  Continues to have intermittent abdominal and back pain.  Denies current chest pain, shortness of breath, dizziness or headache.   Objective:   Vitals:   11/23/19 0342 11/23/19 0800 11/23/19 1105 11/23/19 1245  BP: 131/61 (!) 112/54 (!) 113/56 (!) 118/54  Pulse: 90 76 77 81  Resp: 20 14 10 12   Temp: 99.3 F (37.4 C) 98.3 F (36.8 C) 98.4 F (36.9 C)   TempSrc: Oral Oral Oral   SpO2: 94% 94% 93% 98%  Weight: 70.5 kg     Height:        Intake/Output Summary (Last 24 hours) at 11/23/2019 1431 Last data filed at 11/23/2019 1200 Gross per 24 hour  Intake 1196.32 ml  Output 1000 ml  Net 196.32 ml   Filed Weights    11/18/19 0025 11/18/19 2047 11/23/19 0342  Weight: 63 kg 66 kg 70.5 kg   Exam  General: Well developed, well nourished, NAD, appears stated age  41: NCAT, mucous membranes moist.   Cardiovascular: S1 S2 auscultated, RRR, no murmur  Respiratory: Clear to auscultation bilaterally  Abdomen: Soft, nontender, nondistended, + bowel sounds  Extremities: warm dry without cyanosis clubbing or edema  Neuro: AAOx3, nonfocal  Psych: appropriate mood and affect, pleasant    Data Reviewed: I have personally reviewed following labs and imaging studies  CBC: Recent Labs  Lab 11/17/19 1013 11/17/19 1523 11/20/19 0041 11/20/19 1207 11/21/19 0037 11/22/19 0101 11/23/19 0032  WBC 15.2*  --  13.7*  --  11.6* 14.9* 14.8*  HGB 12.3   < > 10.7* 11.2* 10.2* 9.6* 9.6*  HCT 39.1   < > 31.6* 33.0* 30.0* 28.7* 29.7*  MCV 90.5  --  84.9  --  83.6 85.7 85.3  PLT 287  --  233  --  210 245 248   < > =  values in this interval not displayed.   Basic Metabolic Panel: Recent Labs  Lab 11/19/19 0052 11/19/19 0052 11/20/19 0041 11/20/19 1207 11/21/19 0037 11/22/19 0101 11/23/19 0032  NA 135   < > 134* 139 134* 134* 132*  K 2.5*   < > 2.8* 2.8* 2.8* 4.1 3.5  CL 105   < > 105 104 104 103 100  CO2 19*  --  18*  --  19* 24 22  GLUCOSE 127*   < > 243* 181* 160* 186* 144*  BUN 7*   < > 7* 6* 5* 6* 6*  CREATININE 0.46   < > 0.51 0.30* 0.50 0.38* 0.41*  CALCIUM 8.6*  --  8.1*  --  7.4* 7.5* 7.6*  MG 1.5*  --  1.5*  --  1.7 1.7 1.8   < > = values in this interval not displayed.   GFR: Estimated Creatinine Clearance: 54.9 mL/min (A) (by C-G formula based on SCr of 0.41 mg/dL (L)). Liver Function Tests: Recent Labs  Lab 11/17/19 1013  AST 9*  ALT 16  ALKPHOS 111  BILITOT 0.6  PROT 7.0  ALBUMIN 2.9*   Recent Labs  Lab 11/17/19 1013  LIPASE 34   No results for input(s): AMMONIA in the last 168 hours. Coagulation Profile: No results for input(s): INR, PROTIME in the last 168  hours. Cardiac Enzymes: No results for input(s): CKTOTAL, CKMB, CKMBINDEX, TROPONINI in the last 168 hours. BNP (last 3 results) No results for input(s): PROBNP in the last 8760 hours. HbA1C: No results for input(s): HGBA1C in the last 72 hours. CBG: Recent Labs  Lab 11/22/19 1601 11/22/19 2125 11/22/19 2342 11/23/19 0639 11/23/19 1132  GLUCAP 164* 185* 155* 122* 103*   Lipid Profile: No results for input(s): CHOL, HDL, LDLCALC, TRIG, CHOLHDL, LDLDIRECT in the last 72 hours. Thyroid Function Tests: No results for input(s): TSH, T4TOTAL, FREET4, T3FREE, THYROIDAB in the last 72 hours. Anemia Panel: No results for input(s): VITAMINB12, FOLATE, FERRITIN, TIBC, IRON, RETICCTPCT in the last 72 hours. Urine analysis:    Component Value Date/Time   COLORURINE YELLOW 11/17/2019 1337   APPEARANCEUR CLEAR 11/17/2019 1337   LABSPEC 1.024 11/17/2019 1337   PHURINE 5.0 11/17/2019 1337   GLUCOSEU >=500 (A) 11/17/2019 1337   HGBUR NEGATIVE 11/17/2019 1337   BILIRUBINUR NEGATIVE 11/17/2019 1337   BILIRUBINUR small (A) 11/17/2019 0906   KETONESUR 80 (A) 11/17/2019 1337   PROTEINUR 30 (A) 11/17/2019 1337   UROBILINOGEN 0.2 11/17/2019 0906   NITRITE NEGATIVE 11/17/2019 1337   LEUKOCYTESUR NEGATIVE 11/17/2019 1337   Sepsis Labs: @LABRCNTIP (procalcitonin:4,lacticidven:4)  ) Recent Results (from the past 240 hour(s))  Respiratory Panel by RT PCR (Flu A&B, Covid) - Nasopharyngeal Swab     Status: None   Collection Time: 11/17/19  5:35 PM   Specimen: Nasopharyngeal Swab  Result Value Ref Range Status   SARS Coronavirus 2 by RT PCR NEGATIVE NEGATIVE Final    Comment: (NOTE) SARS-CoV-2 target nucleic acids are NOT DETECTED.  The SARS-CoV-2 RNA is generally detectable in upper respiratoy specimens during the acute phase of infection. The lowest concentration of SARS-CoV-2 viral copies this assay can detect is 131 copies/mL. A negative result does not preclude SARS-Cov-2 infection and  should not be used as the sole basis for treatment or other patient management decisions. A negative result may occur with  improper specimen collection/handling, submission of specimen other than nasopharyngeal swab, presence of viral mutation(s) within the areas targeted by this assay, and inadequate number  of viral copies (<131 copies/mL). A negative result must be combined with clinical observations, patient history, and epidemiological information. The expected result is Negative.  Fact Sheet for Patients:  PinkCheek.be  Fact Sheet for Healthcare Providers:  GravelBags.it  This test is no t yet approved or cleared by the [place] FDA and  has been authorized for detection and/or diagnosis of SARS-CoV-2 by FDA under an Emergency Use Authorization (EUA). This EUA will remain  in effect (meaning this test can be used) for the duration of the COVID-19 declaration under Section 564(b)(1) of the Act, 21 U.S.C. section 360bbb-3(b)(1), unless the authorization is terminated or revoked sooner.     Influenza A by PCR NEGATIVE NEGATIVE Final   Influenza B by PCR NEGATIVE NEGATIVE Final    Comment: (NOTE) The Xpert Xpress SARS-CoV-2/FLU/RSV assay is intended as an aid in  the diagnosis of influenza from Nasopharyngeal swab specimens and  should not be used as a sole basis for treatment. Nasal washings and  aspirates are unacceptable for Xpert Xpress SARS-CoV-2/FLU/RSV  testing.  Fact Sheet for Patients: PinkCheek.be  Fact Sheet for Healthcare Providers: GravelBags.it  This test is not yet approved or cleared by the [place] FDA and  has been authorized for detection and/or diagnosis of SARS-CoV-2 by  FDA under an Emergency Use Authorization (EUA). This EUA will remain  in effect (meaning this test can be used) for the duration of the  Covid-19 declaration  under Section 564(b)(1) of the Act, 21  U.S.C. section 360bbb-3(b)(1), unless the authorization is  terminated or revoked. Performed at [facility], [place] [address], [place], [place] [address]   MRSA PCR Screening     Status: None   Collection Time: 11/18/19  8:56 PM   Specimen: Nasal Mucosa; Nasopharyngeal  Result Value Ref Range Status   MRSA by PCR NEGATIVE NEGATIVE Final    Comment:        The GeneXpert MRSA Assay (FDA approved for NASAL specimens only), is one component of a comprehensive MRSA colonization surveillance program. It is not intended to diagnose MRSA infection nor to guide or monitor treatment for MRSA infections. Performed at [facility], [place] [address], [place], [place] [address]       Radiology Studies: DG Abd 1 View  Result Date: 11/23/2019 CLINICAL DATA:  No bowel movement.  Unable to keep food down. EXAM: ABDOMEN - 1 VIEW COMPARISON:  CT dated 11/17/2019. FINDINGS: Normal bowel gas pattern. Status post cholecystectomy. No evidence of a renal or ureteral stone. Depression of the upper endplate of L1. This is stable from the prior CT consistent with a mild chronic compression fracture. IMPRESSION: 1. No acute findings. 2. Normal bowel gas pattern. Electronically Signed   By: Lajean Manes M.D.   On: 11/23/2019 11:43     Scheduled Meds: . bisacodyl  10 mg Rectal Once  . diltiazem  120 mg Oral Daily  . docusate sodium  100 mg Oral Daily  . insulin aspart  0-15 Units Subcutaneous TID AC & HS  . insulin aspart  2 Units Subcutaneous TID WC  . insulin glargine  8 Units Subcutaneous Q24H  . irbesartan  75 mg Oral Daily  . ondansetron  4 mg Intravenous Q6H  . pantoprazole  40 mg Oral Daily   Continuous Infusions: . lactated ringers 75 mL/hr at 11/23/19 0500     LOS: 6 days   Time Spent in minutes   30 minutes  Chasin Findling D.O. on  11/23/2019 at 2:31 PM  Between 7am to 7pm - Please see pager noted on [email or web address]  After 7pm go to  [email or web address]  And look for the night coverage person covering for me after hours  [facility] Office  [phone]

## 2019-11-23 NOTE — Progress Notes (Addendum)
Daily Rounding Note  11/23/2019, 8:39 AM  LOS: 6 days   SUBJECTIVE:   Chief complaint:   Pancreatic lesions.  Colon polyps.  Back, abd pain   Back pain persists.  Nausea worse this AM.  Generally not feeling well.  Several d since last BM but passing flatus.  Scheduled oxycontin discontinued.  0.5 mg Dilaudid this AM  OBJECTIVE:         Vital signs in last 24 hours:    Temp:  [98.3 F (36.8 C)-99.6 F (37.6 C)] 98.3 F (36.8 C) (10/16 0800) Pulse Rate:  [76-90] 76 (10/16 0800) Resp:  [12-20] 14 (10/16 0800) BP: (112-131)/(54-61) 112/54 (10/16 0800) SpO2:  [94 %-97 %] 94 % (10/16 0800) Weight:  [70.5 kg] 70.5 kg (10/16 0342) Last BM Date:  (Pt unsure of last BM) Filed Weights   11/18/19 0025 11/18/19 2047 11/23/19 0342  Weight: 63 kg 66 kg 70.5 kg   General: looks unwell   Heart: RRR Chest: bil  Rales at  Bases.  Shallow breathing Abdomen: soft, BS hypoactive, ND.  Tender w/o guard/rebound on right  Extremities: no CCE Neuro/Psych:  Oriented x 3.  Groggy.  Appropriate.    Intake/Output from previous day: 10/15 0701 - 10/16 0700 In: 1791.3 [P.O.:320; I.V.:1421.3; IV Piggyback:50] Out: 1000 [Urine:1000]  Intake/Output this shift: Total I/O In: 100 [P.O.:100] Out: -   Lab Results: Recent Labs    11/21/19 0037 11/22/19 0101 11/23/19 0032  WBC 11.6* 14.9* 14.8*  HGB 10.2* 9.6* 9.6*  HCT 30.0* 28.7* 29.7*  PLT 210 245 248   BMET Recent Labs    11/21/19 0037 11/22/19 0101 11/23/19 0032  NA 134* 134* 132*  K 2.8* 4.1 3.5  CL 104 103 100  CO2 19* 24 22  GLUCOSE 160* 186* 144*  BUN 5* 6* 6*  CREATININE 0.50 0.38* 0.41*  CALCIUM 7.4* 7.5* 7.6*   Scheduled Meds:  diltiazem  120 mg Oral Daily   docusate sodium  100 mg Oral Daily   insulin aspart  0-15 Units Subcutaneous TID AC & HS   insulin aspart  2 Units Subcutaneous TID WC   insulin glargine  8 Units Subcutaneous Q24H   irbesartan  75  mg Oral Daily   pantoprazole  40 mg Oral Daily   Continuous Infusions:  lactated ringers 75 mL/hr at 11/23/19 0500   PRN Meds:.dextrose, HYDROmorphone (DILAUDID) injection, ondansetron (ZOFRAN) IV, oxyCODONE   ASSESMENT:   *Back and abdominal pain.  Pancreatic pseudocyst, other cystic lesions.   No known hx of pancreatitis.   Imaging w CT and MRI/MRCP showing 3.2 by 1.7 cm pancreatic pseudocyst And other smaller cystic lesions ? Pseudocysts vs IPMNs. PD dilated centrally. No malignant lesions. Lipase and LFTs normal. CA 19-9 is 228. 10/13 EGD/EUS.White lesions in esophagus, biopsied for Candida (path: No significant pathologic findings, no increase in intraepithelial eosinophils and stains negative for fungal elements). Blood/coffee ground material throughout the stomach. Solitary nonbleeding gastric polyp (hyperplastic) resected/retrieved via mucosal resection and site endoclipped.  Gastric mucosa normal but biopsied for H. Pylori (path: reactive gastropathy, no h pylori) Hyperechoic, heterogeneous, possible cystic component to 27x19 mm diameter lesion at the pancreatic body. Poorly defined borders and suggestion this abuts the splenic artery. Pancreatic duct dilated in the region of the head and neck decompressed to more normal diameter towards the tail. Pancreatic parenchyma lobular, concerning for active/recent pancreatitis. FNA sampling x6 passes obtained from pancreatic lesion. Separate lesion suggestive of  cyst seen at pancreatic head, not in obvious communication with pancreatic duct, measuring 20 x 7 mm,CBD 4.7 mm, CHD 8.7 mm and no abnormal appearance to either duct. Cytology: benign reactive changes, epithelial atypia felt to be inflammatory not malignant.  Still having back and hip pain.        * Narrowing at transverse colon on CT 11/20/2019 colonoscopy. Tortuous colon with stool throughout colon lavaged to achieve adequate visualization. Five,3 to 14  mm,polyps removed throughout the colon. 2, nonbleeding,angioectasias not treated.Pandiverticulosis. Nonbleeding external, internal hemorrhoids. All polyps TAs w/o HGD.    *   [place] anemia.    *   Hyponatremia.     PLAN   *   Has finished post EUS antibiotics.   Likely repeat EUS in several weeks.   Check KUB for ileus.   Dulcolax PR x 1.   Protonix 40 mg po daily.    *  ? Add low to moderate dose Ibuprofen for pain mgt? Renal fx is not compromised.        Nancy Hawkins  11/23/2019, 8:39 AM Phone [phone]

## 2019-11-24 ENCOUNTER — Encounter: Payer: Self-pay | Admitting: Gastroenterology

## 2019-11-24 ENCOUNTER — Inpatient Hospital Stay (HOSPITAL_COMMUNITY): Payer: Medicare Other

## 2019-11-24 ENCOUNTER — Encounter (HOSPITAL_COMMUNITY): Payer: Self-pay | Admitting: Gastroenterology

## 2019-11-24 DIAGNOSIS — E0810 Diabetes mellitus due to underlying condition with ketoacidosis without coma: Secondary | ICD-10-CM | POA: Diagnosis not present

## 2019-11-24 DIAGNOSIS — R110 Nausea: Secondary | ICD-10-CM | POA: Diagnosis not present

## 2019-11-24 DIAGNOSIS — R112 Nausea with vomiting, unspecified: Secondary | ICD-10-CM

## 2019-11-24 DIAGNOSIS — K859 Acute pancreatitis without necrosis or infection, unspecified: Secondary | ICD-10-CM | POA: Diagnosis not present

## 2019-11-24 DIAGNOSIS — I10 Essential (primary) hypertension: Secondary | ICD-10-CM | POA: Diagnosis not present

## 2019-11-24 DIAGNOSIS — K8689 Other specified diseases of pancreas: Secondary | ICD-10-CM | POA: Diagnosis not present

## 2019-11-24 LAB — GLUCOSE, CAPILLARY
Glucose-Capillary: 134 mg/dL — ABNORMAL HIGH (ref 70–99)
Glucose-Capillary: 153 mg/dL — ABNORMAL HIGH (ref 70–99)
Glucose-Capillary: 154 mg/dL — ABNORMAL HIGH (ref 70–99)
Glucose-Capillary: 178 mg/dL — ABNORMAL HIGH (ref 70–99)
Glucose-Capillary: 204 mg/dL — ABNORMAL HIGH (ref 70–99)

## 2019-11-24 LAB — CBC
HCT: 29.5 % — ABNORMAL LOW (ref 36.0–46.0)
Hemoglobin: 9.7 g/dL — ABNORMAL LOW (ref 12.0–15.0)
MCH: 27.7 pg (ref 26.0–34.0)
MCHC: 32.9 g/dL (ref 30.0–36.0)
MCV: 84.3 fL (ref 80.0–100.0)
Platelets: 279 10*3/uL (ref 150–400)
RBC: 3.5 MIL/uL — ABNORMAL LOW (ref 3.87–5.11)
RDW: 14.3 % (ref 11.5–15.5)
WBC: 16.8 10*3/uL — ABNORMAL HIGH (ref 4.0–10.5)
nRBC: 0 % (ref 0.0–0.2)

## 2019-11-24 LAB — BASIC METABOLIC PANEL
Anion gap: 9 (ref 5–15)
BUN: 5 mg/dL — ABNORMAL LOW (ref 8–23)
CO2: 23 mmol/L (ref 22–32)
Calcium: 7.5 mg/dL — ABNORMAL LOW (ref 8.9–10.3)
Chloride: 101 mmol/L (ref 98–111)
Creatinine, Ser: 0.47 mg/dL (ref 0.44–1.00)
GFR, Estimated: >60 mL/min (ref >60)
Glucose, Bld: 174 mg/dL — ABNORMAL HIGH (ref 70–99)
Potassium: 4 mmol/L (ref 3.5–5.1)
Sodium: 133 mmol/L — ABNORMAL LOW (ref 135–145)

## 2019-11-24 LAB — MAGNESIUM: Magnesium: 2 mg/dL (ref 1.7–2.4)

## 2019-11-24 IMAGING — CT CT ABD-PELV W/ CM
2 of 5 series · 15 of 46 positions shown, 17 images · IV contrast (Omni 300)
Comparison: CT abdomen pelvis dated [DATE].

CLINICAL DATA: 79-year-old female with abdominal pain.

EXAM:
CT ABDOMEN AND PELVIS WITH CONTRAST
TECHNIQUE: Multidetector CT imaging of the abdomen and pelvis was performed
using the standard protocol following bolus administration of
intravenous contrast.
CONTRAST:  100mL OMNIPAQUE IOHEXOL 300 MG/ML  SOLN

[Series 3: a/p w/ 5mm · axial · 0.88mm/px · z∈[-608,-168]mm · 12 of 98 slices shown, 14 images]
[im 5/98  soft-tissue]
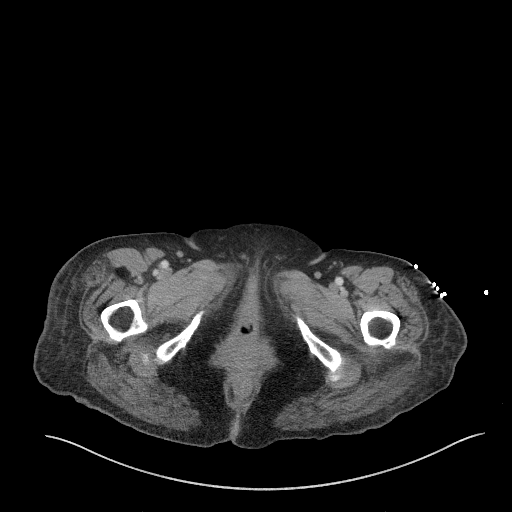
[im 5/98  bone]
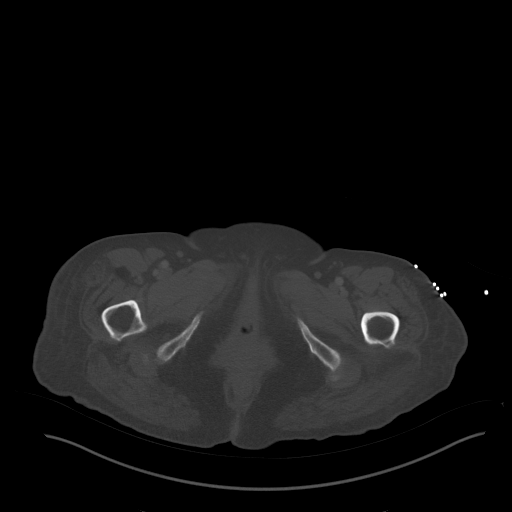
[im 15/98  soft-tissue]
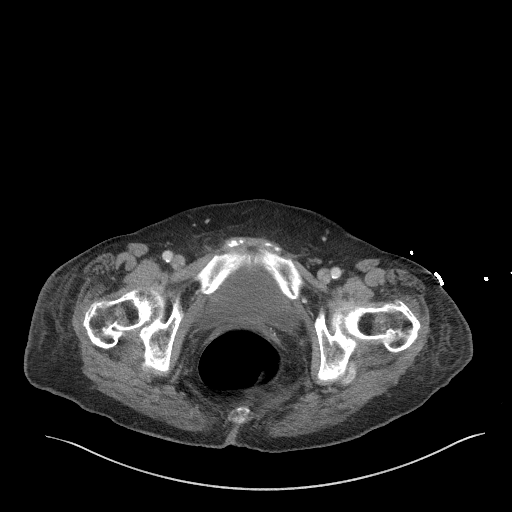
[im 20/98  soft-tissue]
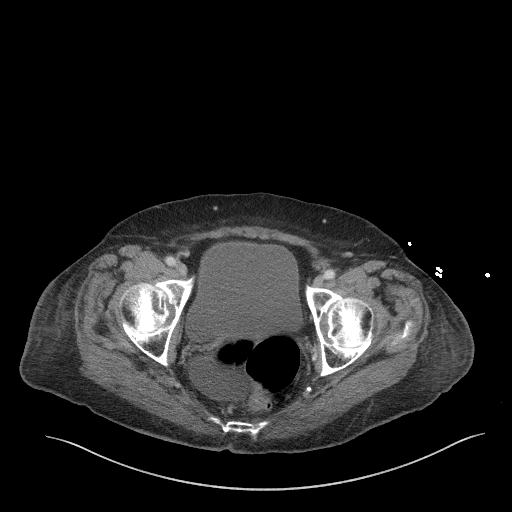
[im 30/98  soft-tissue]
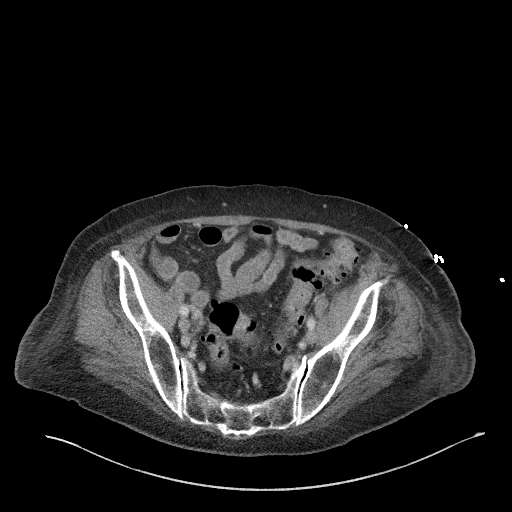
[im 39/98  soft-tissue]
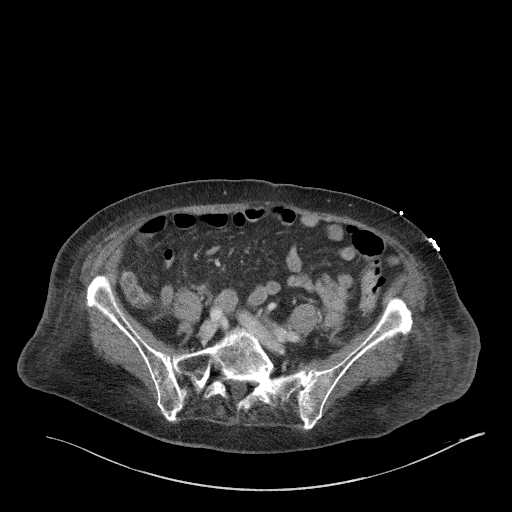
[im 44/98  soft-tissue]
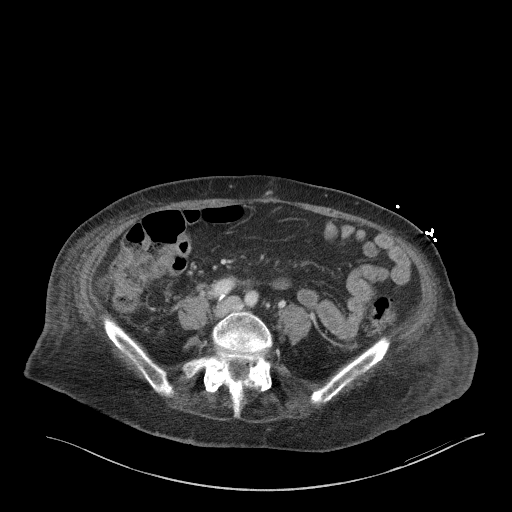
[im 54/98  soft-tissue]
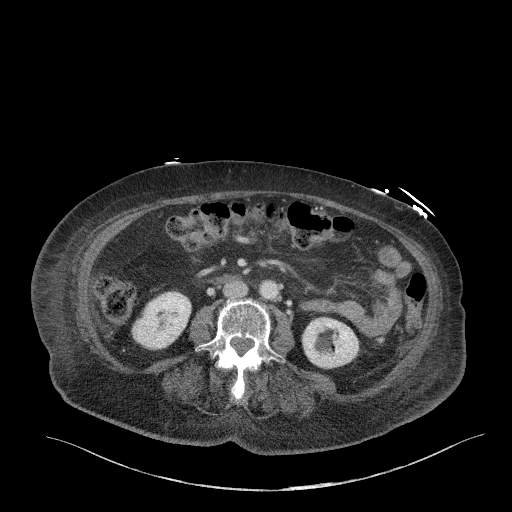
[im 59/98  soft-tissue]
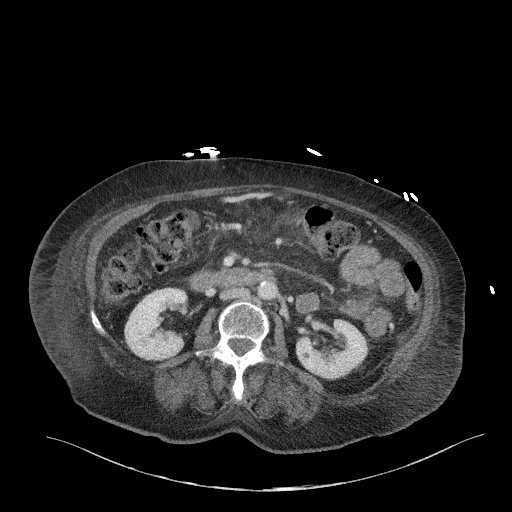
[im 68/98  soft-tissue]
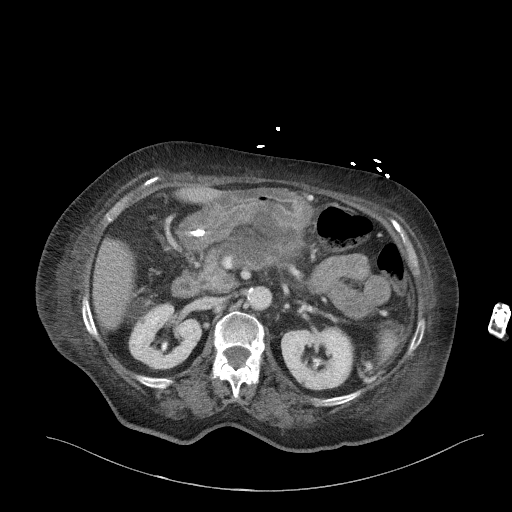
[im 68/98  bone]
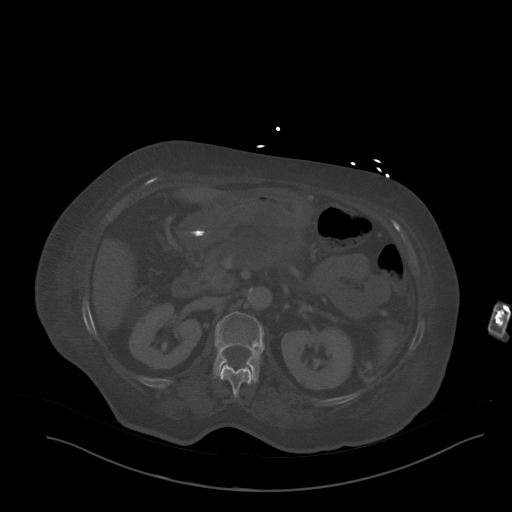
[im 78/98  soft-tissue]
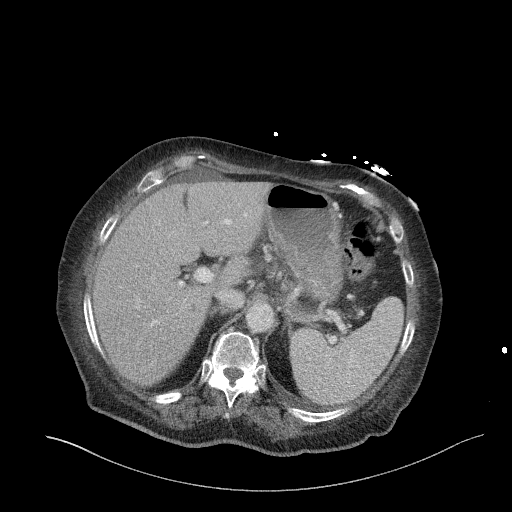
[im 83/98  soft-tissue]
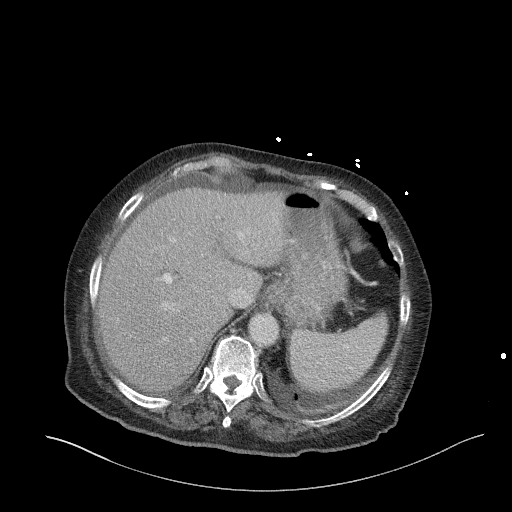
[im 93/98  soft-tissue]
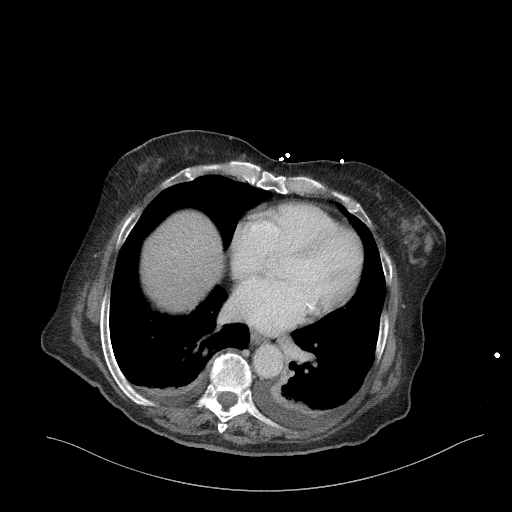

[Series 6: a/p w/ cor · coronal · 0.78mm/px · 3 of 136 slices shown]
[im 46/136  soft-tissue]
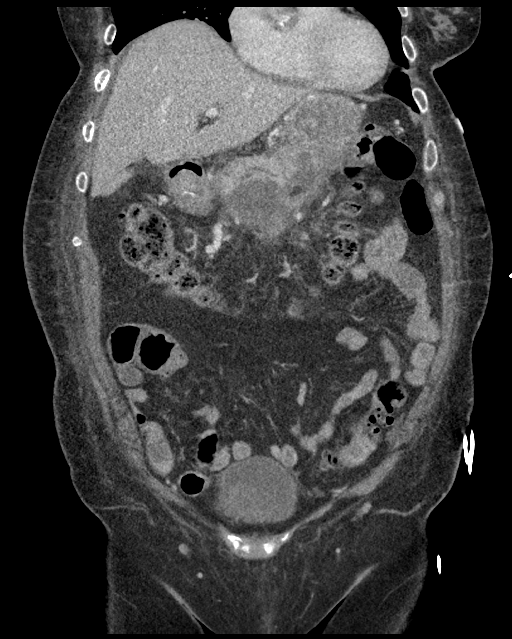
[im 61/136  soft-tissue]
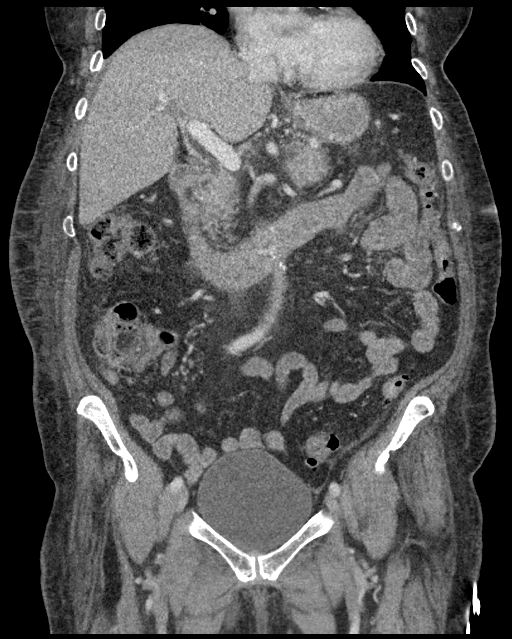
[im 76/136  soft-tissue]
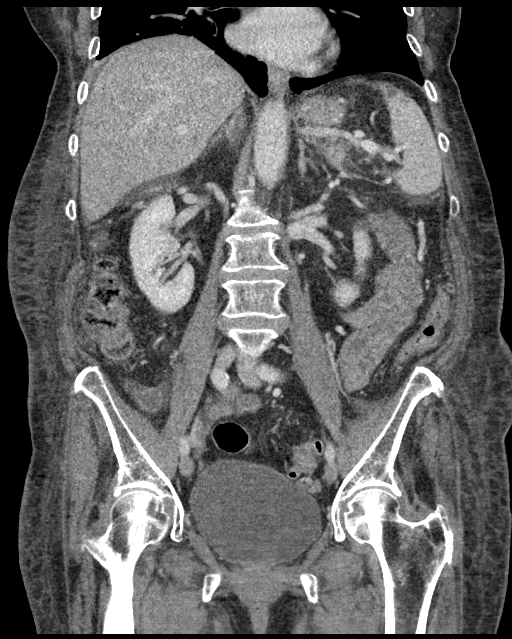

[15 of 46 positions shown; findings below may reference images not displayed]

FINDINGS: Lower chest: Partially visualized small bilateral pleural effusions
with bilateral lower lobe subsegmental consolidation which may
represent atelectasis. Pneumonia is not excluded. Clinical
correlation is recommended. There is coronary vascular
calcification.

No intra-abdominal free air.  Small ascites, new since the prior CT.

Hepatobiliary: The liver is unremarkable. No intrahepatic biliary
ductal dilatation. Cholecystectomy. No retained calcified stone
noted in the central CBD.

Pancreas: There is inflammatory changes of the pancreas in keeping
with pancreatitis. There are multiple peripancreatic collections
consistent with developing pseudocysts or abscesses. These
collections have increased in size compared to the prior CT and
measure up to 4.7 x 3.2 cm in greatest combined dimensions (32/3).
There is extension of the pseudocyst into the posterior wall of the
body of the stomach. There may be penetration through the gastric
wall into the lumen of the stomach ([DATE]) representing early
marsupialization.

Spleen: Normal in size without focal abnormality.

Adrenals/Urinary Tract: The adrenal glands unremarkable. There is no
hydronephrosis on either side. There is symmetric enhancement and
excretion of contrast by both kidneys. There is a 1 cm left renal
inferior pole cyst. Additional 1 cm exophytic hypodense lesion from
the lateral interpolar left kidney noted, likely a cyst. The
visualized ureters and urinary bladder appear unremarkable.

Stomach/Bowel: There is inflammatory changes of the gastric wall
secondary to pancreatitis. There is a metallic clip in the distal
stomach which is new since the prior CT and likely representing an
ingested foreign object. There is an apparent focal area of loss of
integrity of the gastric mucosa anteriorly (axial 32/3 and coronal
[DATE]). No free air or evidence of perforation. There is extensive
sigmoid diverticulosis without active inflammatory changes. There is
moderate stool throughout the colon. There is no bowel obstruction.
The appendix is normal.

Vascular/Lymphatic: Mild aortoiliac atherosclerotic disease. The IVC
is unremarkable. No portal venous gas. There is nonvisualization of
the splenic vein consistent with splenic vein thrombosis. There is
no adenopathy.

Reproductive: Hysterectomy.

Other: Diffuse subcutaneous edema.

Musculoskeletal: Osteopenia with degenerative changes of the spine.
Multilevel old compression fractures. No acute osseous pathology.
IMPRESSION: 1. Acute pancreatitis with multiple developing pseudocysts/abscesses
which have increased in size since the prior CT.
2. Preparation of the pancreatic pseudocyst into the gastric wall.
Continued follow-up recommended.
3. Occlusion of the splenic vein.
4. Small ascites, new since the prior CT.
5. Small bilateral pleural effusions with bilateral lower lobe
subsegmental atelectasis or infiltrate.
6. A metallic clip in the distal stomach likely representing an
ingested foreign object.
7. Sigmoid diverticulosis. No bowel obstruction. Normal appendix.
8. Aortic Atherosclerosis ([RO]-[RO]).

## 2019-11-24 MED ORDER — IOHEXOL 300 MG/ML  SOLN
100.0000 mL | Freq: Once | INTRAMUSCULAR | Status: AC | PRN
  Administered 2019-11-24: 100 mL via INTRAVENOUS

## 2019-11-24 NOTE — Progress Notes (Signed)
Pt tolerated approx 145ml of an ensure shake given to her by her daughter. She also tolerated several bites of macaroni and cheese. Tramadol effective for pain management and zofran effective for nausea. No nausea or vomiting noted during my shift of 6 hours. Pt taken to CT via hospital bed at 1830

## 2019-11-24 NOTE — Progress Notes (Signed)
Daily Rounding Note  11/24/2019, 8:15 AM  LOS: 7 days   SUBJECTIVE:   Chief complaint:  Cystic pancreatic lesion.  Back pain.  nausea  Soft BPs intermittently.  No tachy.  No fevers.  Wbcs rising.   Used 50 ultram, 5 mg Oxy IR yesterday.   Eating 30 to 50% of meals Pain today focused in LLQ.  Nausea persists, worse after po but not vomiting.  Sitting in BS chair made nausea worse.   sats dropped to 80s during PT session, oxygen placed and pt feels better with this so remains on NCO2. Pain and nausea better.  Passing gas, no stools  OBJECTIVE:         Vital signs in last 24 hours:    Temp:  [98.3 F (36.8 C)-98.6 F (37 C)] 98.6 F (37 C) (10/17 0729) Pulse Rate:  [77-89] 85 (10/17 0729) Resp:  [10-20] 20 (10/17 0729) BP: (103-130)/(53-63) 130/63 (10/17 0729) SpO2:  [93 %-99 %] 93 % (10/17 0729) Last BM Date:  (Pt unsure of last BM) Filed Weights   11/18/19 0025 11/18/19 2047 11/23/19 0342  Weight: 63 kg 66 kg 70.5 kg   General: more alert, looks better but still ill   Heart: RRR Chest: crackles in bases Abdomen: soft, tender in RUQ and minimal in LLQ.  ND.  BS hypoactive  Extremities: no CCE Neuro/Psych:  More alert but still somewhat groggy.  Able to espress how she is feeling.  No gross deficits.    Intake/Output from previous day: 10/16 0701 - 10/17 0700 In: 723.9 [P.O.:440; I.V.:283.9] Out: 600 [Urine:600]  Intake/Output this shift: Total I/O In: 120 [P.O.:120] Out: 400 [Urine:400]  Lab Results: Recent Labs    11/22/19 0101 11/23/19 0032 11/24/19 0052  WBC 14.9* 14.8* 16.8*  HGB 9.6* 9.6* 9.7*  HCT 28.7* 29.7* 29.5*  PLT 245 248 279   BMET Recent Labs    11/22/19 0101 11/23/19 0032 11/24/19 0052  NA 134* 132* 133*  K 4.1 3.5 4.0  CL 103 100 101  CO2 24 22 23   GLUCOSE 186* 144* 174*  BUN 6* 6* 5*  CREATININE 0.38* 0.41* 0.47  CALCIUM 7.5* 7.6* 7.5*   LFT No results for input(s):  PROT, ALBUMIN, AST, ALT, ALKPHOS, BILITOT, BILIDIR, IBILI in the last 72 hours. PT/INR No results for input(s): LABPROT, INR in the last 72 hours. Hepatitis Panel No results for input(s): HEPBSAG, HCVAB, HEPAIGM, HEPBIGM in the last 72 hours.  Studies/Results: DG Abd 1 View  Result Date: 11/23/2019 CLINICAL DATA:  No bowel movement.  Unable to keep food down. EXAM: ABDOMEN - 1 VIEW COMPARISON:  CT dated 11/17/2019. FINDINGS: Normal bowel gas pattern. Status post cholecystectomy. No evidence of a renal or ureteral stone. Depression of the upper endplate of L1. This is stable from the prior CT consistent with a mild chronic compression fracture. IMPRESSION: 1. No acute findings. 2. Normal bowel gas pattern. Electronically Signed   By: Lajean Manes M.D.   On: 11/23/2019 11:43    ASSESMENT:   *   Cystic pancreatic mass, dilated PD.  Other cystic lesions.   No known hx of pancreatitis.   Imaging w CT and MRI/MRCP showing 3.2 by 1.7 cm pancreatic pseudocyst And other smaller cystic lesions ? Pseudocysts vs IPMNs. PD dilated centrally. No malignant lesions.  Lipase and LFTs normal. CA 19-9 is 228. 10/13 EGD/EUS.White lesions in esophagus, biopsied for Candida(path:No significant pathologic findings, no increase in intraepithelial  eosinophils and stains negative for fungal elements). Blood/coffee ground material throughout the stomach. Solitary nonbleeding gastric polyp(hyperplastic)resected/retrieved via mucosal resection and site endoclipped. Gastric mucosa normal but biopsied for H. Pylori (path: reactive gastropathy, no h pylori) Hyperechoic, heterogeneous, possible cystic component to 27x19 mm diameter lesion at the pancreatic body. Poorly defined borders and suggestion this abuts the splenic artery. Pancreatic duct dilated in the region of the head and neck decompressed to more normal diameter towards the tail. Pancreatic parenchyma lobular, concerning for active/recent  pancreatitis. FNA sampling x6 passes obtained from pancreatic lesion. Separate lesion suggestive of cyst seen at pancreatic head, not in obvious communication with pancreatic duct, measuring 20 x 7 mm,CBD 4.7 mm, CHD 8.7 mm and no abnormal appearance to either duct. Cytology: benign reactive changes, epithelial atypia felt to be inflammatory not malignant. However malignancy not excluded. WBCs rising.   Unremarkable BGP, no ileus on KUB 10/16current pain regimen is ultram prn, Oxy IR prn, Dilaudid IV prn.    *   Adenomatous colon polyps Narrowing of transverse colon on CT 11/20/2019 colonoscopy. Tortuous colon with stool throughout colon lavaged to achieve adequate visualization. Five,3 to 14 mm,polyps removed throughout the colon. 2, nonbleeding,angioectasias not treated.Pandiverticulosis. Nonbleeding external, internal hemorrhoids.Path: TAs w/o HGD.   *   [place] anemia.  Hgb stable.    *   Hyponatremia.   Stable.     PLAN   *   Tomorrow, Monday, will discuss case w Dr Rush Landmark.    *   Suspect she will need temporary coretrak tube feedings but will defer this to MDs.  Nancy Hawkins  11/24/2019, 8:15 AM Phone [phone]

## 2019-11-24 NOTE — Progress Notes (Signed)
PROGRESS NOTE    Nancy Hawkins  WGN:[id number] DOB: 08-17-1940 DOA: 11/17/2019 PCP: Lilian Coma., MD   Brief Narrative:  HPI on 11/17/2019 by Dr. Burnadette Peter is a 79 y.o. female with medical history significant of HTN and HLD who presents for worsening intermittent abdominal and back pain. She states she has had intermittent abdominal pain and low back pain for about 1 months. She has seen her primary care doctor a couple of times since the pain began. At one point she was diagnosed with diabetes and prescribed Metformin (which she did not tolerate and stopped taking), she was thought to have GERD and given a trial of PPI, which has not helped her symptoms. The pain has remained intermittent, but has progressively worsened to a 10/10 last night, prompting her to visit urgent care leading to a subsequent transfer to the ED. she does not report specific aggravating aggravating or alleviating factors.  Pain at times feels like it is radiating around both sides to her back. she has intermittent associated nausea without vomiting but does not necessarily correlate with her intermittent pain.  She also reports a degree of constipation for the last several days.  She denies vomiting, diarrhea, chest pain, shortness of breath.  She has never had a colonoscopy.  Interim history Patient admitted with DKA, was placed on insulin drip, which is now completed.  She is now on sliding scale.  She was also noted to have pancreatic mass/inflammation.  Gastroenterology was consulted, s/p colonoscopy and EUS. Advancing diet and attempting to control pain.  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  Question pseudocyst versus IPMNs. -Placed on IV fluids and currently n.p.o -lipase and LFTs normal -CA 19-9 elevated at 228 -Gastroenterology consulted and appreciated.  -S/p colonoscopy which showed 5 polyps ranging from 3 to 2 mm, removed with a cold snare.  2 colonic angiectasias, nonbleeding nonthrombosed external and internal hemorrhoids -Status post EGD/EUS: White nummular lesions in the esophageal mucosa, gastritis.  Hematin in the entire stomach.  One gastric polyp resected and retrieved, clips were placed.  Pancreatic body lesion.  Cystic lesion seen in the pancreatic head. -GI recommended PPI 40 mg twice daily for 1 month, then 40 mg thereafter -cytology and surgical pathology appear to be benign -patient able to tolerate soft diet- but complains of nausea -Discussed with gastroenterology, patient will likely need repeat EUS however will reassess on 11/25/2019 for further planning -Still feeling some pain and nausea with eating, patient may need cortrak- decision still pending   Back and abdominal pain -Likely secondary to the above, continue pain control with IV Dilaudid and as needed oxycodone -have added tramadol PRN  Essential hypertension -BP currently stable, continue Avapro, Cardizem  Focal narrowing of ascending colon -Radiology read notes this may be due to peristalsis however patient has never had a colonoscopy  Hypokalemia/hypomagnesemia -After aggressively replacing potassium, currently at 4 -pending magnesium level  -Continue to monitor and replace as needed  DVT Prophylaxis SCDs  Code Status: Full  Family Communication: None at bedside  Disposition Plan:  Status is: Inpatient  Remains inpatient appropriate because:Persistent severe  electrolyte disturbances, Ongoing active pain requiring inpatient pain management, Ongoing diagnostic  testing needed not appropriate for outpatient work up and Inpatient level of care appropriate due to severity of illness.  Continue electrolyte replacement.  Currently on soft diet. Pending further GI recommendations.   Dispo: The patient is from: Home              Anticipated d/c is to: Home              Anticipated d/c date is: TBD              Patient currently is not medically stable to d/c.  Consultants Gastroenterology  Procedures  MRCP Colonoscopy EUS and EGD  Antibiotics   Anti-infectives (From admission, onward)   Start     Dose/Rate Route Frequency Ordered Stop   11/21/19 2200  ciprofloxacin (CIPRO) IVPB 400 mg  Status:  Discontinued        400 mg 200 mL/hr over 60 Minutes Intravenous Every 12 hours 11/20/19 1503 11/21/19 1351   11/21/19 2000  ciprofloxacin (CIPRO) tablet 500 mg  Status:  Discontinued        500 mg Oral 2 times daily 11/21/19 1351 11/21/19 1424   11/21/19 2000  ciprofloxacin (CIPRO) tablet 500 mg        500 mg Oral 2 times daily 11/21/19 1424 11/23/19 6073      Subjective:   Nancy Hawkins seen and examined today.  Continues to have some nausea and pain with eating or movement.  Denies current chest pain or shortness of breath, abdominal pain, nausea or vomiting, dizziness or headache.    Objective:   Vitals:   11/23/19 1900 11/23/19 2326 11/24/19 0409 11/24/19 0729  BP: (!) 118/59 (!) 103/53 127/63 130/63  Pulse: 87  89 85  Resp: 11  17 20   Temp: 98.5 F (36.9 C) 98.3 F (36.8 C) 98.6 F (37 C) 98.6 F (37 C)  TempSrc: Oral Oral Oral Oral  SpO2: 98%  97% 93%  Weight:      Height:        Intake/Output Summary (Last 24 hours) at 11/24/2019 1010 Last data filed at 11/24/2019 0729 Gross per 24 hour  Intake 743.88 ml  Output 1000 ml  Net -256.12 ml   Filed Weights   11/18/19 0025 11/18/19 2047 11/23/19 0342  Weight: 63 kg 66 kg 70.5 kg   Exam  General: Well developed, well nourished, NAD, appears stated age  79: NCAT, mucous  membranes moist.   Cardiovascular: S1 S2 auscultated, no murmur, RRR  Respiratory: Clear to auscultation bilaterally with equal chest rise  Abdomen: Soft, mildly TTP, nondistended, + bowel sounds  Extremities: warm dry without cyanosis clubbing or edema  Neuro: AAOx3, nonfocal  Psych: pleasant, appropriate mood and affect   Data Reviewed: I have personally reviewed following labs and imaging studies  CBC: Recent Labs  Lab 11/20/19 0041 11/20/19 0041 11/20/19 1207 11/21/19 0037 11/22/19 0101 11/23/19 0032 11/24/19 0052  WBC 13.7*  --   --  11.6* 14.9* 14.8* 16.8*  HGB 10.7*   < > 11.2* 10.2* 9.6* 9.6* 9.7*  HCT 31.6*   < > 33.0* 30.0* 28.7* 29.7* 29.5*  MCV 84.9  --   --  83.6 85.7 85.3 84.3  PLT 233  --   --  210 245 248 279   < > = values in this interval not displayed.   Basic Metabolic Panel: Recent Labs  Lab 11/19/19 0052 11/19/19 0052 11/20/19 0041 11/20/19  3295 11/20/19 1207 11/21/19 0037 11/22/19 0101 11/23/19 0032 11/24/19 0052  NA 135   < > 134*   < > 139 134* 134* 132* 133*  K 2.5*   < > 2.8*   < > 2.8* 2.8* 4.1 3.5 4.0  CL 105   < > 105   < > 104 104 103 100 101  CO2 19*   < > 18*  --   --  19* 24 22 23   GLUCOSE 127*   < > 243*   < > 181* 160* 186* 144* 174*  BUN 7*   < > 7*   < > 6* 5* 6* 6* 5*  CREATININE 0.46   < > 0.51   < > 0.30* 0.50 0.38* 0.41* 0.47  CALCIUM 8.6*   < > 8.1*  --   --  7.4* 7.5* 7.6* 7.5*  MG 1.5*  --  1.5*  --   --  1.7 1.7 1.8  --    < > = values in this interval not displayed.   GFR: Estimated Creatinine Clearance: 54.9 mL/min (by C-G formula based on SCr of 0.47 mg/dL). Liver Function Tests: Recent Labs  Lab 11/17/19 1013  AST 9*  ALT 16  ALKPHOS 111  BILITOT 0.6  PROT 7.0  ALBUMIN 2.9*   Recent Labs  Lab 11/17/19 1013  LIPASE 34   No results for input(s): AMMONIA in the last 168 hours. Coagulation Profile: No results for input(s): INR, PROTIME in the last 168 hours. Cardiac Enzymes: No results for  input(s): CKTOTAL, CKMB, CKMBINDEX, TROPONINI in the last 168 hours. BNP (last 3 results) No results for input(s): PROBNP in the last 8760 hours. HbA1C: No results for input(s): HGBA1C in the last 72 hours. CBG: Recent Labs  Lab 11/23/19 1132 11/23/19 1632 11/23/19 2137 11/23/19 2352 11/24/19 0619  GLUCAP 103* 171* 216* 182* 134*   Lipid Profile: No results for input(s): CHOL, HDL, LDLCALC, TRIG, CHOLHDL, LDLDIRECT in the last 72 hours. Thyroid Function Tests: No results for input(s): TSH, T4TOTAL, FREET4, T3FREE, THYROIDAB in the last 72 hours. Anemia Panel: No results for input(s): VITAMINB12, FOLATE, FERRITIN, TIBC, IRON, RETICCTPCT in the last 72 hours. Urine analysis:    Component Value Date/Time   COLORURINE YELLOW 11/17/2019 1337   APPEARANCEUR CLEAR 11/17/2019 1337   LABSPEC 1.024 11/17/2019 1337   PHURINE 5.0 11/17/2019 1337   GLUCOSEU >=500 (A) 11/17/2019 1337   HGBUR NEGATIVE 11/17/2019 1337   BILIRUBINUR NEGATIVE 11/17/2019 1337   BILIRUBINUR small (A) 11/17/2019 0906   KETONESUR 80 (A) 11/17/2019 1337   PROTEINUR 30 (A) 11/17/2019 1337   UROBILINOGEN 0.2 11/17/2019 0906   NITRITE NEGATIVE 11/17/2019 1337   LEUKOCYTESUR NEGATIVE 11/17/2019 1337   Sepsis Labs: @LABRCNTIP (procalcitonin:4,lacticidven:4)  ) Recent Results (from the past 240 hour(s))  Respiratory Panel by RT PCR (Flu A&B, Covid) - Nasopharyngeal Swab     Status: None   Collection Time: 11/17/19  5:35 PM   Specimen: Nasopharyngeal Swab  Result Value Ref Range Status   SARS Coronavirus 2 by RT PCR NEGATIVE NEGATIVE Final    Comment: (NOTE) SARS-CoV-2 target nucleic acids are NOT DETECTED.  The SARS-CoV-2 RNA is generally detectable in upper respiratoy specimens during the acute phase of infection. The lowest concentration of SARS-CoV-2 viral copies this assay can detect is 131 copies/mL. A negative result does not preclude SARS-Cov-2 infection and should not be used as the sole basis for  treatment or other patient management decisions. A negative result may occur  with  improper specimen collection/handling, submission of specimen other than nasopharyngeal swab, presence of viral mutation(s) within the areas targeted by this assay, and inadequate number of viral copies (<131 copies/mL). A negative result must be combined with clinical observations, patient history, and epidemiological information. The expected result is Negative.  Fact Sheet for Patients:  PinkCheek.be  Fact Sheet for Healthcare Providers:  GravelBags.it  This test is no t yet approved or cleared by the [place] FDA and  has been authorized for detection and/or diagnosis of SARS-CoV-2 by FDA under an Emergency Use Authorization (EUA). This EUA will remain  in effect (meaning this test can be used) for the duration of the COVID-19 declaration under Section 564(b)(1) of the Act, 21 U.S.C. section 360bbb-3(b)(1), unless the authorization is terminated or revoked sooner.     Influenza A by PCR NEGATIVE NEGATIVE Final   Influenza B by PCR NEGATIVE NEGATIVE Final    Comment: (NOTE) The Xpert Xpress SARS-CoV-2/FLU/RSV assay is intended as an aid in  the diagnosis of influenza from Nasopharyngeal swab specimens and  should not be used as a sole basis for treatment. Nasal washings and  aspirates are unacceptable for Xpert Xpress SARS-CoV-2/FLU/RSV  testing.  Fact Sheet for Patients: PinkCheek.be  Fact Sheet for Healthcare Providers: GravelBags.it  This test is not yet approved or cleared by the [place] FDA and  has been authorized for detection and/or diagnosis of SARS-CoV-2 by  FDA under an Emergency Use Authorization (EUA). This EUA will remain  in effect (meaning this test can be used) for the duration of the  Covid-19 declaration under Section 564(b)(1) of the Act, 21    U.S.C. section 360bbb-3(b)(1), unless the authorization is  terminated or revoked. Performed at [facility], [place] [address], [place], [place] [address]   MRSA PCR Screening     Status: None   Collection Time: 11/18/19  8:56 PM   Specimen: Nasal Mucosa; Nasopharyngeal  Result Value Ref Range Status   MRSA by PCR NEGATIVE NEGATIVE Final    Comment:        The GeneXpert MRSA Assay (FDA approved for NASAL specimens only), is one component of a comprehensive MRSA colonization surveillance program. It is not intended to diagnose MRSA infection nor to guide or monitor treatment for MRSA infections. Performed at [facility], [place] [address], [place], [place] [address]       Radiology Studies: DG Abd 1 View  Result Date: 11/23/2019 CLINICAL DATA:  No bowel movement.  Unable to keep food down. EXAM: ABDOMEN - 1 VIEW COMPARISON:  CT dated 11/17/2019. FINDINGS: Normal bowel gas pattern. Status post cholecystectomy. No evidence of a renal or ureteral stone. Depression of the upper endplate of L1. This is stable from the prior CT consistent with a mild chronic compression fracture. IMPRESSION: 1. No acute findings. 2. Normal bowel gas pattern. Electronically Signed   By: Lajean Manes M.D.   On: 11/23/2019 11:43     Scheduled Meds: . bisacodyl  10 mg Rectal Once  . diltiazem  120 mg Oral Daily  . insulin aspart  0-15 Units Subcutaneous TID AC & HS  . insulin aspart  2 Units Subcutaneous TID WC  . insulin glargine  8 Units Subcutaneous Q24H  . irbesartan  75 mg Oral Daily  . ondansetron  4 mg Intravenous Q6H  . pantoprazole  40 mg Oral Daily   Continuous Infusions: . lactated ringers 75 mL/hr at 11/23/19 0500  LOS: 7 days   Time Spent in minutes   30 minutes  Cleatus Gabriel D.O. on 11/24/2019 at 10:10 AM  Between 7am to 7pm - Please see pager noted on [email or web address]  After 7pm go to [email or web address]  And look for the night coverage person covering for me after  hours  [facility] Office  [phone]

## 2019-11-25 DIAGNOSIS — I10 Essential (primary) hypertension: Secondary | ICD-10-CM | POA: Diagnosis not present

## 2019-11-25 DIAGNOSIS — E0810 Diabetes mellitus due to underlying condition with ketoacidosis without coma: Secondary | ICD-10-CM | POA: Diagnosis not present

## 2019-11-25 DIAGNOSIS — K8689 Other specified diseases of pancreas: Secondary | ICD-10-CM | POA: Diagnosis not present

## 2019-11-25 LAB — GLUCOSE, CAPILLARY
Glucose-Capillary: 166 mg/dL — ABNORMAL HIGH (ref 70–99)
Glucose-Capillary: 121 mg/dL — ABNORMAL HIGH (ref 70–99)
Glucose-Capillary: 184 mg/dL — ABNORMAL HIGH (ref 70–99)
Glucose-Capillary: 226 mg/dL — ABNORMAL HIGH (ref 70–99)

## 2019-11-25 LAB — COMPREHENSIVE METABOLIC PANEL
ALT: 18 U/L (ref 0–44)
AST: 13 U/L — ABNORMAL LOW (ref 15–41)
Albumin: 1.5 g/dL — ABNORMAL LOW (ref 3.5–5.0)
Alkaline Phosphatase: 206 U/L — ABNORMAL HIGH (ref 38–126)
Anion gap: 11 (ref 5–15)
BUN: 5 mg/dL — ABNORMAL LOW (ref 8–23)
CO2: 23 mmol/L (ref 22–32)
Calcium: 7.7 mg/dL — ABNORMAL LOW (ref 8.9–10.3)
Chloride: 98 mmol/L (ref 98–111)
Creatinine, Ser: 0.37 mg/dL — ABNORMAL LOW (ref 0.44–1.00)
GFR, Estimated: >60 mL/min (ref >60)
Glucose, Bld: 165 mg/dL — ABNORMAL HIGH (ref 70–99)
Potassium: 3.7 mmol/L (ref 3.5–5.1)
Sodium: 132 mmol/L — ABNORMAL LOW (ref 135–145)
Total Bilirubin: 0.9 mg/dL (ref 0.3–1.2)
Total Protein: 5.1 g/dL — ABNORMAL LOW (ref 6.5–8.1)

## 2019-11-25 LAB — CBC
HCT: 29.8 % — ABNORMAL LOW (ref 36.0–46.0)
Hemoglobin: 9.8 g/dL — ABNORMAL LOW (ref 12.0–15.0)
MCH: 27.6 pg (ref 26.0–34.0)
MCHC: 32.9 g/dL (ref 30.0–36.0)
MCV: 83.9 fL (ref 80.0–100.0)
Platelets: 299 10*3/uL (ref 150–400)
RBC: 3.55 MIL/uL — ABNORMAL LOW (ref 3.87–5.11)
RDW: 14.1 % (ref 11.5–15.5)
WBC: 17 10*3/uL — ABNORMAL HIGH (ref 4.0–10.5)
nRBC: 0 % (ref 0.0–0.2)

## 2019-11-25 LAB — MAGNESIUM: Magnesium: 1.7 mg/dL (ref 1.7–2.4)

## 2019-11-25 MED ORDER — MAGNESIUM SULFATE 2 GM/50ML IV SOLN
2.0000 g | Freq: Once | INTRAVENOUS | Status: AC
  Administered 2019-11-25: 2 g via INTRAVENOUS
  Filled 2019-11-25: qty 50

## 2019-11-25 MED ORDER — ONDANSETRON HCL 4 MG/2ML IJ SOLN
4.0000 mg | Freq: Four times a day (QID) | INTRAMUSCULAR | Status: DC | PRN
  Administered 2019-11-25 – 2019-11-26 (×3): 4 mg via INTRAVENOUS
  Filled 2019-11-25 (×3): qty 2

## 2019-11-25 MED ORDER — BOOST / RESOURCE BREEZE PO LIQD CUSTOM
1.0000 | Freq: Three times a day (TID) | ORAL | Status: DC
  Administered 2019-11-25 – 2019-11-28 (×6): 1 via ORAL

## 2019-11-25 NOTE — Progress Notes (Signed)
[facility] Progress Note  Chief Complaint: Epigastric pain and pancreatitis  History:  Signout received from Dr. Hilarie Fredrickson and extensive chart review performed. Suspected focal pancreatitis with masslike area in the mid pancreas and additional possible pseudocyst seen on cross-sectional imaging and EUS last week.  EUS guided FNA performed, no malignancy seen on that, but there are still clinical features worrisome for malignancy (appearance of pancreatic body lesion, recent back pain, loss of appetite and weight loss, elevated CA 19-9).  Her daughter is with her today, and they tell me that she ate much better late yesterday and this morning.  It does not seem to have caused an increase in abdominal pain nor nausea or vomiting. Nancy Hawkins would therefore like to hold off on a nasoenteric feeding tube for now.  ROS: Cardiovascular: Denies chest pain Respiratory: Denies dyspnea Urinary: Denies dysuria  Objective:   Current Facility-Administered Medications:  .  bisacodyl (DULCOLAX) suppository 10 mg, 10 mg, Rectal, Once, Gribbin, Sarah J, PA-C .  dextrose 50 % solution 0-50 mL, 0-50 mL, Intravenous, PRN, Marcelyn Bruins, MD .  diltiazem (CARDIZEM CD) 24 hr capsule 120 mg, 120 mg, Oral, Daily, Marcelyn Bruins, MD, 120 mg at 11/25/19 0933 .  HYDROmorphone (DILAUDID) injection 0.5 mg, 0.5 mg, Intravenous, Q6H PRN, Marcelyn Bruins, MD, 0.5 mg at 11/23/19 0441 .  insulin aspart (novoLOG) injection 0-15 Units, 0-15 Units, Subcutaneous, TID AC & HS, Shalhoub, Sherryll Burger, MD, 3 Units at 11/25/19 [phone] .  insulin aspart (novoLOG) injection 2 Units, 2 Units, Subcutaneous, TID WC, Shalhoub, Sherryll Burger, MD, 2 Units at 11/20/19 1729 .  insulin glargine (LANTUS) injection 8 Units, 8 Units, Subcutaneous, Q24H, Shalhoub, Sherryll Burger, MD, 8 Units at 11/25/19 0000 .  irbesartan (AVAPRO) tablet 75 mg, 75 mg, Oral, Daily, Marcelyn Bruins, MD, 75 mg at 11/25/19 0932 .  lactated ringers infusion, , Intravenous,  Continuous, Little Ishikawa, MD, Last Rate: 75 mL/hr at 11/25/19 1000, Rate Verify at 11/25/19 1000 .  oxyCODONE (Oxy IR/ROXICODONE) immediate release tablet 5 mg, 5 mg, Oral, Q4H PRN, Cristal Ford, DO, 5 mg at 11/23/19 2139 .  pantoprazole (PROTONIX) EC tablet 40 mg, 40 mg, Oral, Daily, Vena Rua, PA-C, 40 mg at 11/25/19 0933 .  traMADol (ULTRAM) tablet 50 mg, 50 mg, Oral, Q6H PRN, Cristal Ford, DO, 50 mg at 11/25/19 0408  . lactated ringers 75 mL/hr at 11/25/19 1000     Vital signs in last 24 hrs: Vitals:   11/24/19 2349 11/25/19 0722  BP: (!) 118/48 (!) 103/45  Pulse: 73 75  Resp: 16 16  Temp: 98.6 F (37 C) 98.6 F (37 C)  SpO2: 99% 98%    Intake/Output Summary (Last 24 hours) at 11/25/2019 1040 Last data filed at 11/25/2019 1000 Gross per 24 hour  Intake 465 ml  Output 750 ml  Net -285 ml     Physical Exam Chronically ill-appearing, poor muscle mass, fatigued.  Sitting up in chair, conversational, nontoxic.  Daughter at bedside for entire visit.  HEENT: sclera anicteric, oral mucosa without lesions  Neck: supple, no thyromegaly, JVD or lymphadenopathy  Cardiac: RRR without murmurs, S1S2 heard, no peripheral edema  Pulm: clear to auscultation bilaterally, normal RR and effort noted  Abdomen: soft, upper tenderness (she says no worse than yesterday), with active bowel sounds. No guarding or palpable hepatosplenomegaly  Skin; warm and dry, no jaundice  Recent Labs:  CBC Latest Ref Rng & Units 11/25/2019 11/24/2019 11/23/2019  WBC 4.0 - 10.5  K/uL 17.0(H) 16.8(H) 14.8(H)  Hemoglobin 12.0 - 15.0 g/dL 9.8(L) 9.7(L) 9.6(L)  Hematocrit 36 - 46 % 29.8(L) 29.5(L) 29.7(L)  Platelets 150 - 400 K/uL 299 279 248    No results for input(s): INR in the last 168 hours. CMP Latest Ref Rng & Units 11/25/2019 11/24/2019 11/23/2019  Glucose 70 - 99 mg/dL 165(H) 174(H) 144(H)  BUN 8 - 23 mg/dL 5(L) 5(L) 6(L)  Creatinine 0.44 - 1.00 mg/dL 0.37(L) 0.47 0.41(L)   Sodium 135 - 145 mmol/L 132(L) 133(L) 132(L)  Potassium 3.5 - 5.1 mmol/L 3.7 4.0 3.5  Chloride 98 - 111 mmol/L 98 101 100  CO2 22 - 32 mmol/L 23 23 22   Calcium 8.9 - 10.3 mg/dL 7.7(L) 7.5(L) 7.6(L)  Total Protein 6.5 - 8.1 g/dL 5.1(L) - -  Total Bilirubin 0.3 - 1.2 mg/dL 0.9 - -  Alkaline Phos 38 - 126 U/L 206(H) - -  AST 15 - 41 U/L 13(L) - -  ALT 0 - 44 U/L 18 - -   Albumin 1.5  Radiologic studies:  CT abdomen and pelvis from yesterday was personally reviewed.  The pancreatitis is worse than on the scan a week ago. There is the suggestion in the report of communication between the pancreas and the gastric lumen.  The metallic foreign body is a clip placed at the site of gastric polypectomy by Dr. Rush Landmark last week.  (EUS report was also reviewed)   Assessment & Plan  Assessment:  Epigastric pain Weight loss Pancreatitis, acutely worse in last several days, perhaps as a result of EUS guided FNA of the pancreas. Pancreatic masslike area with features of pancreatitis but also some features worrisome for malignancy in the setting of overall clinical picture. Severe protein calorie malnutrition with albumin 1.5   Plan: She would like to hold off on a nasoenteric feeding tube for now but is amenable to if absolutely necessary.  I am definitely concerned about her malnutrition, as I think it was present prior to admission and now worse in the setting of this acute illness.  Will likely be a significant obstacle to her recovery.  She is able to eat more in the last 12 to 24 hours, but we will have to see if that trend continues.  Protein calorie liquid supplements will be added to her regimen as well.  If she has increasing pain or otherwise is intolerant of this diet, then we will need to back off on it and proceed with nasoenteric tube feed placement.  I also discussed the case with Dr. Rush Landmark to determine timing of repeat EUS and possible additional biopsies.  Naturally,  the concern there is possibility of more limited visualization of the area in question due to the acute inflammation and risk of further pancreatitis from more intervention.  However, this must be weighed against the worrisome clinical picture suggesting possible malignancy.   35 minutes were spent on this encounter (including chart review, history/exam, counseling/coordination of care, and documentation)    Nancy Hawkins Office: [phone]

## 2019-11-25 NOTE — Progress Notes (Signed)
Inpatient Diabetes Program Recommendations  AACE/ADA: New Consensus Statement on Inpatient Glycemic Control (2015)  Target Ranges:  Prepandial:   less than 140 mg/dL      Peak postprandial:   less than 180 mg/dL (1-2 hours)      Critically ill patients:  140 - 180 mg/dL   Lab Results  Component Value Date   GLUCAP 166 (H) 11/25/2019   HGBA1C 11.0 (H) 11/17/2019    Review of Glycemic Control Results for Nancy Hawkins, Nancy Hawkins (MRN [id number]) as of 11/25/2019 11:03  Ref. Range 11/24/2019 11:17 11/24/2019 16:45 11/24/2019 21:32 11/24/2019 23:52 11/25/2019 06:17  Glucose-Capillary Latest Ref Range: 70 - 99 mg/dL 154 (H) 204 (H) 178 (H) 153 (H) 166 (H)   Diabetes history: Newly diagnosed in past month- unable to tolerate metformin Current orders for Inpatient glycemic control:  Novolog moderate tid with meals and HS Novolog 2 units tid with meals Lantus 8 units daily Inpatient Diabetes Program Recommendations:    Will follow up with patient today regarding insulin teaching.  She will likely  Need insulin at d/c.   Thanks,  Adah Perl, RN, BC-ADM Inpatient Diabetes Coordinator Pager [phone] (8a-5p)

## 2019-11-25 NOTE — Progress Notes (Signed)
Claimed for the first t time she managed to consume 80 % of the food during breakfast.

## 2019-11-25 NOTE — Progress Notes (Signed)
PROGRESS NOTE    Nancy Hawkins  DXA:[id number] DOB: [birth year] DOA: 11/17/2019 PCP: Lilian Coma., MD   Brief Narrative:  HPI on 11/17/2019 by Dr. Burnadette Peter is a 79 y.o. female with medical history significant of HTN and HLD who presents for worsening intermittent abdominal and back pain. She states she has had intermittent abdominal pain and low back pain for about 1 months. She has seen her primary care doctor a couple of times since the pain began. At one point she was diagnosed with diabetes and prescribed Metformin (which she did not tolerate and stopped taking), she was thought to have GERD and given a trial of PPI, which has not helped her symptoms. The pain has remained intermittent, but has progressively worsened to a 10/10 last night, prompting her to visit urgent care leading to a subsequent transfer to the ED. she does not report specific aggravating aggravating or alleviating factors.  Pain at times feels like it is radiating around both sides to her back. she has intermittent associated nausea without vomiting but does not necessarily correlate with her intermittent pain.  She also reports a degree of constipation for the last several days.  She denies vomiting, diarrhea, chest pain, shortness of breath.  She has never had a colonoscopy.  Interim history Patient admitted with DKA, was placed on insulin drip, which is now completed.  She is now on sliding scale.  She was also noted to have pancreatic mass/inflammation.  Gastroenterology was consulted, s/p colonoscopy and EUS. Advancing diet and attempting to control pain.  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  Question pseudocyst versus IPMNs. -Placed on IV fluids and currently n.p.o -lipase and LFTs normal -CA 19-9 elevated at 228 -Gastroenterology consulted and appreciated.  -S/p colonoscopy which showed 5 polyps ranging from 3 to 2 mm, removed with a cold snare.  2 colonic angiectasias, nonbleeding nonthrombosed external and internal hemorrhoids -Status post EGD/EUS: White nummular lesions in the esophageal mucosa, gastritis.  Hematin in the entire stomach.  One gastric polyp resected and retrieved, clips were placed.  Pancreatic body lesion.  Cystic lesion seen in the pancreatic head. -GI recommended PPI 40 mg twice daily for 1 month, then 40 mg thereafter -cytology and surgical pathology appear to be benign -patient able to tolerate soft diet- but complains of nausea -Discussed with gastroenterology, patient will likely need repeat EUS however will reassess on 11/25/2019 for further planning -Still feeling some pain and nausea with eating, patient may need cortrak- decision still pending  -Patient was able to eat approximately 80% of her breakfast this morning without normal pain, denied nausea -Pending further recommendations from gastroenterology  Back and abdominal pain -Likely secondary to the above, continue pain control with IV Dilaudid and as needed oxycodone -have added tramadol PRN  Essential hypertension -BP currently stable, continue Avapro, Cardizem  Focal narrowing of ascending colon -Radiology read notes this may be due to peristalsis however patient has never had a colonoscopy  Hypokalemia/hypomagnesemia -After aggressively replacing potassium, currently at 3.7 -Magnesium level 1.7, will replace -Continue to monitor and replace as needed  DVT Prophylaxis SCDs  Code Status: Full  Family Communication: None at bedside  Disposition Plan:  Status  is: Inpatient  Remains inpatient appropriate because:Persistent severe electrolyte disturbances, Ongoing active pain requiring inpatient pain management, Ongoing diagnostic testing needed not appropriate for outpatient work up and Inpatient level of care appropriate due to severity of illness.  Continue electrolyte replacement.  Currently on soft diet. Pending further GI recommendations.   Dispo: The patient is from: Home              Anticipated d/c is to: Home              Anticipated d/c date is: TBD              Patient currently is not medically stable to d/c.  Consultants Gastroenterology  Procedures  MRCP Colonoscopy EUS and EGD  Antibiotics   Anti-infectives (From admission, onward)   Start     Dose/Rate Route Frequency Ordered Stop   11/21/19 2200  ciprofloxacin (CIPRO) IVPB 400 mg  Status:  Discontinued        400 mg 200 mL/hr over 60 Minutes Intravenous Every 12 hours 11/20/19 1503 11/21/19 1351   11/21/19 2000  ciprofloxacin (CIPRO) tablet 500 mg  Status:  Discontinued        500 mg Oral 2 times daily 11/21/19 1351 11/21/19 1424   11/21/19 2000  ciprofloxacin (CIPRO) tablet 500 mg        500 mg Oral 2 times daily 11/21/19 1424 11/23/19 1884      Subjective:   [name] seen and examined today.  Patient was able to eat her breakfast this morning.  Denies any nausea or vomiting.  Does have some abdominal pain.  Denies chest pain or shortness of breath, dizziness or headache.  Objective:   Vitals:   11/24/19 1058 11/24/19 2000 11/24/19 2349 11/25/19 0722  BP: (!) 120/59 (!) 135/58 (!) 118/48 (!) 103/45  Pulse: 78 80 73 75  Resp: 15 15 16 16   Temp: 98.6 F (37 C) 98.5 F (36.9 C) 98.6 F (37 C) 98.6 F (37 C)  TempSrc: Oral Oral Oral Oral  SpO2: 96% 98% 99% 98%  Weight:      Height:        Intake/Output Summary (Last 24 hours) at 11/25/2019 1045 Last data filed at 11/25/2019 1000 Gross per 24 hour  Intake 465 ml  Output 750 ml  Net -285 ml   Filed  Weights   11/18/19 0025 11/18/19 2047 11/23/19 0342  Weight: 63 kg 66 kg 70.5 kg   Exam  General: Well developed, well nourished, NAD, appears stated age  13: NCAT, mucous membranes moist.   Cardiovascular: S1 S2 auscultated, RRR, no murmur appreciated  Respiratory: Diminished breath sounds however clear  Abdomen: Soft, nontender, nondistended, + bowel sounds  Extremities: warm dry without cyanosis clubbing or edema  Neuro: AAOx3, nonfocal  Psych: appropriate mood and affect  Data Reviewed: I have personally reviewed following labs and imaging studies  CBC: Recent Labs  Lab 11/21/19 0037 11/22/19 0101 11/23/19 0032 11/24/19 0052 11/25/19 0116  WBC 11.6* 14.9* 14.8* 16.8* 17.0*  HGB 10.2* 9.6* 9.6* 9.7* 9.8*  HCT 30.0* 28.7* 29.7* 29.5* 29.8*  MCV 83.6 85.7 85.3 84.3 83.9  PLT 210 245 248 279 166   Basic Metabolic Panel: Recent Labs  Lab 11/21/19 0037 11/22/19 0101 11/23/19 0032 11/24/19 0052 11/25/19 0116  NA 134* 134* 132* 133* 132*  K 2.8* 4.1 3.5 4.0 3.7  CL 104 103 100  101 98  CO2 19* 24 22 23 23   GLUCOSE 160* 186* 144* 174* 165*  BUN 5* 6* 6* 5* 5*  CREATININE 0.50 0.38* 0.41* 0.47 0.37*  CALCIUM 7.4* 7.5* 7.6* 7.5* 7.7*  MG 1.7 1.7 1.8 2.0 1.7   GFR: Estimated Creatinine Clearance: 54.9 mL/min (A) (by C-G formula based on SCr of 0.37 mg/dL (L)). Liver Function Tests: Recent Labs  Lab 11/25/19 0116  AST 13*  ALT 18  ALKPHOS 206*  BILITOT 0.9  PROT 5.1*  ALBUMIN 1.5*   No results for input(s): LIPASE, AMYLASE in the last 168 hours. No results for input(s): AMMONIA in the last 168 hours. Coagulation Profile: No results for input(s): INR, PROTIME in the last 168 hours. Cardiac Enzymes: No results for input(s): CKTOTAL, CKMB, CKMBINDEX, TROPONINI in the last 168 hours. BNP (last 3 results) No results for input(s): PROBNP in the last 8760 hours. HbA1C: No results for input(s): HGBA1C in the last 72 hours. CBG: Recent Labs  Lab  11/24/19 1117 11/24/19 1645 11/24/19 2132 11/24/19 2352 11/25/19 0617  GLUCAP 154* 204* 178* 153* 166*   Lipid Profile: No results for input(s): CHOL, HDL, LDLCALC, TRIG, CHOLHDL, LDLDIRECT in the last 72 hours. Thyroid Function Tests: No results for input(s): TSH, T4TOTAL, FREET4, T3FREE, THYROIDAB in the last 72 hours. Anemia Panel: No results for input(s): VITAMINB12, FOLATE, FERRITIN, TIBC, IRON, RETICCTPCT in the last 72 hours. Urine analysis:    Component Value Date/Time   COLORURINE YELLOW 11/17/2019 1337   APPEARANCEUR CLEAR 11/17/2019 1337   LABSPEC 1.024 11/17/2019 1337   PHURINE 5.0 11/17/2019 1337   GLUCOSEU >=500 (A) 11/17/2019 1337   HGBUR NEGATIVE 11/17/2019 1337   BILIRUBINUR NEGATIVE 11/17/2019 1337   BILIRUBINUR small (A) 11/17/2019 0906   KETONESUR 80 (A) 11/17/2019 1337   PROTEINUR 30 (A) 11/17/2019 1337   UROBILINOGEN 0.2 11/17/2019 0906   NITRITE NEGATIVE 11/17/2019 1337   LEUKOCYTESUR NEGATIVE 11/17/2019 1337   Sepsis Labs: @LABRCNTIP (procalcitonin:4,lacticidven:4)  ) Recent Results (from the past 240 hour(s))  Respiratory Panel by RT PCR (Flu A&B, Covid) - Nasopharyngeal Swab     Status: None   Collection Time: 11/17/19  5:35 PM   Specimen: Nasopharyngeal Swab  Result Value Ref Range Status   SARS Coronavirus 2 by RT PCR NEGATIVE NEGATIVE Final    Comment: (NOTE) SARS-CoV-2 target nucleic acids are NOT DETECTED.  The SARS-CoV-2 RNA is generally detectable in upper respiratoy specimens during the acute phase of infection. The lowest concentration of SARS-CoV-2 viral copies this assay can detect is 131 copies/mL. A negative result does not preclude SARS-Cov-2 infection and should not be used as the sole basis for treatment or other patient management decisions. A negative result may occur with  improper specimen collection/handling, submission of specimen other than nasopharyngeal swab, presence of viral mutation(s) within the areas targeted  by this assay, and inadequate number of viral copies (<131 copies/mL). A negative result must be combined with clinical observations, patient history, and epidemiological information. The expected result is Negative.  Fact Sheet for Patients:  PinkCheek.be  Fact Sheet for Healthcare Providers:  GravelBags.it  This test is no t yet approved or cleared by the [place] FDA and  has been authorized for detection and/or diagnosis of SARS-CoV-2 by FDA under an Emergency Use Authorization (EUA). This EUA will remain  in effect (meaning this test can be used) for the duration of the COVID-19 declaration under Section 564(b)(1) of the Act, 21 U.S.C. section 360bbb-3(b)(1), unless the authorization is  terminated or revoked sooner.     Influenza A by PCR NEGATIVE NEGATIVE Final   Influenza B by PCR NEGATIVE NEGATIVE Final    Comment: (NOTE) The Xpert Xpress SARS-CoV-2/FLU/RSV assay is intended as an aid in  the diagnosis of influenza from Nasopharyngeal swab specimens and  should not be used as a sole basis for treatment. Nasal washings and  aspirates are unacceptable for Xpert Xpress SARS-CoV-2/FLU/RSV  testing.  Fact Sheet for Patients: PinkCheek.be  Fact Sheet for Healthcare Providers: GravelBags.it  This test is not yet approved or cleared by the [place] FDA and  has been authorized for detection and/or diagnosis of SARS-CoV-2 by  FDA under an Emergency Use Authorization (EUA). This EUA will remain  in effect (meaning this test can be used) for the duration of the  Covid-19 declaration under Section 564(b)(1) of the Act, 21  U.S.C. section 360bbb-3(b)(1), unless the authorization is  terminated or revoked. Performed at [facility], [place] [address], [place], [place] [address]   MRSA PCR Screening     Status: None   Collection Time: 11/18/19  8:56 PM     Specimen: Nasal Mucosa; Nasopharyngeal  Result Value Ref Range Status   MRSA by PCR NEGATIVE NEGATIVE Final    Comment:        The GeneXpert MRSA Assay (FDA approved for NASAL specimens only), is one component of a comprehensive MRSA colonization surveillance program. It is not intended to diagnose MRSA infection nor to guide or monitor treatment for MRSA infections. Performed at [facility], [place] [address] [place], [place] [address]       Radiology Studies: CT ABDOMEN PELVIS W CONTRAST  Result Date: 11/24/2019 CLINICAL DATA:  79 year old female with abdominal pain. EXAM: CT ABDOMEN AND PELVIS WITH CONTRAST TECHNIQUE: Multidetector CT imaging of the abdomen and pelvis was performed using the standard protocol following bolus administration of intravenous contrast. CONTRAST:  185mL OMNIPAQUE IOHEXOL 300 MG/ML  SOLN COMPARISON:  CT abdomen pelvis dated 11/17/2019. FINDINGS: Lower chest: Partially visualized small bilateral pleural effusions with bilateral lower lobe subsegmental consolidation which may represent atelectasis. Pneumonia is not excluded. Clinical correlation is recommended. There is coronary vascular calcification. No intra-abdominal free air.  Small ascites, new since the prior CT. Hepatobiliary: The liver is unremarkable. No intrahepatic biliary ductal dilatation. Cholecystectomy. No retained calcified stone noted in the central CBD. Pancreas: There is inflammatory changes of the pancreas in keeping with pancreatitis. There are multiple peripancreatic collections consistent with developing pseudocysts or abscesses. These collections have increased in size compared to the prior CT and measure up to 4.7 x 3.2 cm in greatest combined dimensions (32/3). There is extension of the pseudocyst into the posterior wall of the body of the stomach. There may be penetration through the gastric wall into the lumen of the stomach (31/3) representing early marsupialization. Spleen:  Normal in size without focal abnormality. Adrenals/Urinary Tract: The adrenal glands unremarkable. There is no hydronephrosis on either side. There is symmetric enhancement and excretion of contrast by both kidneys. There is a 1 cm left renal inferior pole cyst. Additional 1 cm exophytic hypodense lesion from the lateral interpolar left kidney noted, likely a cyst. The visualized ureters and urinary bladder appear unremarkable. Stomach/Bowel: There is inflammatory changes of the gastric wall secondary to pancreatitis. There is a metallic clip in the distal stomach which is new since the prior CT and likely representing an ingested foreign object. There is an apparent focal area of loss of  integrity of the gastric mucosa anteriorly (axial 32/3 and coronal 28/6). No free air or evidence of perforation. There is extensive sigmoid diverticulosis without active inflammatory changes. There is moderate stool throughout the colon. There is no bowel obstruction. The appendix is normal. Vascular/Lymphatic: Mild aortoiliac atherosclerotic disease. The IVC is unremarkable. No portal venous gas. 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 There is no adenopathy. Reproductive: Hysterectomy. Other: Diffuse subcutaneous edema. Musculoskeletal: Osteopenia with degenerative changes of the spine. Multilevel old compression fractures. No acute osseous pathology. IMPRESSION: 1. Acute pancreatitis with multiple developing pseudocysts/abscesses which have increased in size since the prior CT. 2. Preparation of the pancreatic pseudocyst into the gastric wall. Continued follow-up recommended. 3. Occlusion of the splenic vein. 4. Small ascites, new since the prior CT. 5. Small bilateral pleural effusions with bilateral lower lobe subsegmental atelectasis or infiltrate. 6. A metallic clip in the distal stomach likely representing an ingested foreign object. 7. Sigmoid diverticulosis. No bowel  obstruction. Normal appendix. 8. Aortic Atherosclerosis (ICD10-I70.0). Electronically Signed   By: Anner Crete M.D.   On: 11/24/2019 19:46     Scheduled Meds: . bisacodyl  10 mg Rectal Once  . diltiazem  120 mg Oral Daily  . insulin aspart  0-15 Units Subcutaneous TID AC & HS  . insulin aspart  2 Units Subcutaneous TID WC  . insulin glargine  8 Units Subcutaneous Q24H  . irbesartan  75 mg Oral Daily  . pantoprazole  40 mg Oral Daily   Continuous Infusions: . lactated ringers 75 mL/hr at 11/25/19 1000     LOS: 8 days   Time Spent in minutes   30 minutes  Smith Potenza D.O. on 11/25/2019 at 10:45 AM  Between 7am to 7pm - Please see pager noted on [email or web address]  After 7pm go to [email or web address]  And look for the night coverage person covering for me after hours  [facility] Office  [phone]

## 2019-11-25 NOTE — Plan of Care (Signed)

## 2019-11-25 NOTE — Progress Notes (Signed)
Physical Therapy Treatment Patient Details Name: Nancy Hawkins MRN: [id number] DOB: 1940-03-11 Today's Date: 11/25/2019    History of Present Illness is a 79 y.o. female with medical history significant of HTN and HLD who presents for worsening intermittent abdominal and back pain. She states she has had intermittent abdominal pain and low back pain for about 1 months. She has seen her primary care doctor a couple of times since the pain began. At one point she was diagnosed with diabetes and prescribed Metformin (which she did not tolerate and stopped taking), she was thought to have GERD and given a trial of PPI, which has not helped her symptoms. The pain has remained intermittent, but has progressively worsened to a 10/10 last night, prompting her to visit urgent care leading to a subsequent transfer to the ED. She was found to be in DKA and was noted to have a pancreatic mass/inflammation.     PT Comments    Pt supine in bed on entry, agreeable to therapy and states she is "feeling much better". Pt is min guard for bed mobility, transfers and ambulation of 40 feet without AD. Pt able to stand at sink to wash hands, brush teeth and brush her hair. Pt on RA throughout session and able to maintain SaO2 >93%O2. D/c plans remains appropriate. PT will continue to follow acutely.   Follow Up Recommendations  Home health PT;Supervision/Assistance - 24 hour     Equipment Recommendations  Rolling G[place]man with 5" wheels (May progress away from RW by discharge)       Precautions / Restrictions Precautions Precautions: Fall Restrictions Weight Bearing Restrictions: No    Mobility  Bed Mobility Overal bed mobility: Needs Assistance Bed Mobility: Supine to Sit;Sit to Supine     Supine to sit: Min guard Sit to supine: Min guard   General bed mobility comments: min guard for safety and management of lines   Transfers Overall transfer level: Needs assistance Equipment used: None Transfers:  Sit to/from Stand Sit to Stand: Min guard         General transfer comment: min guard for safety, from bed and from lower toilet surface  Ambulation/Gait Ambulation/Gait assistance: Min assist;Min guard Gait Distance (Feet): 40 Feet Assistive device: None Gait Pattern/deviations: Decreased stride length;Trunk flexed;Step-through pattern Gait velocity: Decreased   General Gait Details: min A for ambulation to bathroom and back to sink, min guard for ambulation of 40 feet in room before becoming fatigued         Balance Overall balance assessment: Needs assistance Sitting-balance support: Feet supported;No upper extremity supported Sitting balance-Leahy Scale: Fair     Standing balance support: During functional activity;No upper extremity supported;Single extremity supported;Bilateral upper extremity supported Standing balance-Leahy Scale: Fair Standing balance comment: able to static stand at sink to brush teeth                            Cognition Arousal/Alertness: Awake/alert Behavior During Therapy: WFL for tasks assessed/performed Overall Cognitive Status: Within Functional Limits for tasks assessed                                           General Comments General comments (skin integrity, edema, etc.): SaO2 on RA >93%O2 throughout session, max HR noted       Pertinent Vitals/Pain Pain Assessment: Faces Faces Pain Scale:  Hurts a little bit Pain Location: headache Pain Descriptors / Indicators: Headache Pain Intervention(s): Limited activity within patient's tolerance;Monitored during session;Repositioned           PT Goals (current goals can now be found in the care plan section) Acute Rehab PT Goals Patient Stated Goal: Feel better PT Goal Formulation: With patient/family Time For Goal Achievement: 11/30/19 Potential to Achieve Goals: Good Progress towards PT goals: Progressing toward goals    Frequency    Min  3X/week      PT Plan Current plan remains appropriate       AM-PAC PT "6 Clicks" Mobility   Outcome Measure  Help needed turning from your back to your side while in a flat bed without using bedrails?: None Help needed moving from lying on your back to sitting on the side of a flat bed without using bedrails?: None Help needed moving to and from a bed to a chair (including a wheelchair)?: None Help needed standing up from a chair using your arms (e.g., wheelchair or bedside chair)?: None Help needed to walk in hospital room?: None Help needed climbing 3-5 steps with a railing? : A Lot 6 Click Score: 22    End of Session Equipment Utilized During Treatment: Gait belt Activity Tolerance: Patient tolerated treatment well (Nausea) Patient left: in bed;with call bell/phone within reach Nurse Communication: Mobility status PT Visit Diagnosis: Unsteadiness on feet (R26.81);Muscle weakness (generalized) (M62.81)     Time: 0623-7628 PT Time Calculation (min) (ACUTE ONLY): 29 min  Charges:  $Therapeutic Activity: 23-37 mins                     Quron Ruddy B. Migdalia Dk PT, DPT Acute Rehabilitation Services Pager [phone] Office [phone]    [place] [facility] [place] 11/25/2019, 3:29 PM

## 2019-11-26 DIAGNOSIS — K8689 Other specified diseases of pancreas: Secondary | ICD-10-CM | POA: Diagnosis not present

## 2019-11-26 DIAGNOSIS — I10 Essential (primary) hypertension: Secondary | ICD-10-CM | POA: Diagnosis not present

## 2019-11-26 DIAGNOSIS — R1010 Upper abdominal pain, unspecified: Secondary | ICD-10-CM

## 2019-11-26 DIAGNOSIS — K859 Acute pancreatitis without necrosis or infection, unspecified: Secondary | ICD-10-CM | POA: Diagnosis not present

## 2019-11-26 DIAGNOSIS — E0810 Diabetes mellitus due to underlying condition with ketoacidosis without coma: Secondary | ICD-10-CM | POA: Diagnosis not present

## 2019-11-26 LAB — CBC
HCT: 29.3 % — ABNORMAL LOW (ref 36.0–46.0)
Hemoglobin: 9.4 g/dL — ABNORMAL LOW (ref 12.0–15.0)
MCH: 27.6 pg (ref 26.0–34.0)
MCHC: 32.1 g/dL (ref 30.0–36.0)
MCV: 85.9 fL (ref 80.0–100.0)
Platelets: 299 10*3/uL (ref 150–400)
RBC: 3.41 MIL/uL — ABNORMAL LOW (ref 3.87–5.11)
RDW: 14.3 % (ref 11.5–15.5)
WBC: 9.4 10*3/uL (ref 4.0–10.5)
nRBC: 0 % (ref 0.0–0.2)

## 2019-11-26 LAB — GLUCOSE, CAPILLARY
Glucose-Capillary: 208 mg/dL — ABNORMAL HIGH (ref 70–99)
Glucose-Capillary: 225 mg/dL — ABNORMAL HIGH (ref 70–99)
Glucose-Capillary: 170 mg/dL — ABNORMAL HIGH (ref 70–99)
Glucose-Capillary: 233 mg/dL — ABNORMAL HIGH (ref 70–99)

## 2019-11-26 LAB — BASIC METABOLIC PANEL
Anion gap: 10 (ref 5–15)
BUN: 6 mg/dL — ABNORMAL LOW (ref 8–23)
CO2: 25 mmol/L (ref 22–32)
Calcium: 7.9 mg/dL — ABNORMAL LOW (ref 8.9–10.3)
Chloride: 101 mmol/L (ref 98–111)
Creatinine, Ser: 0.56 mg/dL (ref 0.44–1.00)
GFR, Estimated: >60 mL/min (ref >60)
Glucose, Bld: 258 mg/dL — ABNORMAL HIGH (ref 70–99)
Potassium: 3.5 mmol/L (ref 3.5–5.1)
Sodium: 136 mmol/L (ref 135–145)

## 2019-11-26 MED ORDER — INSULIN GLARGINE 100 UNIT/ML ~~LOC~~ SOLN
10.0000 [IU] | SUBCUTANEOUS | Status: DC
  Administered 2019-11-27 – 2019-11-28 (×2): 10 [IU] via SUBCUTANEOUS
  Filled 2019-11-26 (×2): qty 0.1

## 2019-11-26 MED ORDER — LIVING WELL WITH DIABETES BOOK
Freq: Once | Status: AC
  Filled 2019-11-26: qty 1

## 2019-11-26 MED ORDER — OXYCODONE HCL 5 MG PO TABS
5.0000 mg | ORAL_TABLET | ORAL | Status: DC | PRN
  Administered 2019-11-27: 5 mg via ORAL
  Filled 2019-11-26: qty 1

## 2019-11-26 MED ORDER — POTASSIUM CHLORIDE CRYS ER 20 MEQ PO TBCR
40.0000 meq | EXTENDED_RELEASE_TABLET | Freq: Once | ORAL | Status: AC
  Administered 2019-11-26: 40 meq via ORAL
  Filled 2019-11-26: qty 2

## 2019-11-26 MED ORDER — INSULIN STARTER KIT- PEN NEEDLES (ENGLISH)
1.0000 | Freq: Once | Status: AC
  Administered 2019-11-26: 1
  Filled 2019-11-26 (×2): qty 1

## 2019-11-26 MED ORDER — BISACODYL 5 MG PO TBEC: 5.0000 mg | DELAYED_RELEASE_TABLET | Freq: Every day | ORAL | Status: DC | PRN

## 2019-11-26 MED ORDER — ENSURE MAX PROTEIN PO LIQD
11.0000 [oz_av] | Freq: Two times a day (BID) | ORAL | Status: DC
  Administered 2019-11-26 – 2019-11-27 (×2): 11 [oz_av] via ORAL
  Filled 2019-11-26 (×5): qty 330

## 2019-11-26 NOTE — Progress Notes (Addendum)
Inpatient Diabetes Program Recommendations  AACE/ADA: New Consensus Statement on Inpatient Glycemic Control (2015)  Target Ranges:  Prepandial:   less than 140 mg/dL      Peak postprandial:   less than 180 mg/dL (1-2 hours)      Critically ill patients:  140 - 180 mg/dL   Lab Results  Component Value Date   GLUCAP 208 (H) 11/26/2019   HGBA1C 11.0 (H) 11/17/2019    Review of Glycemic Control Results for TELLY, BROBERG (MRN [id number]) as of 11/26/2019 09:45  Ref. Range 11/25/2019 11:26 11/25/2019 16:12 11/25/2019 21:05 11/26/2019 06:26  Glucose-Capillary Latest Ref Range: 70 - 99 mg/dL 121 (H) 184 (H) 226 (H) 208 (H)   Diabetes history: DM  Outpatient Diabetes medications: Metformin- however discontinued Current orders for Inpatient glycemic control:  Novolog moderate tid with meals Novolog 2 units tid with meals Lantus 8 units q 24 hours  Inpatient Diabetes Program Recommendations:    Please increase Lantus to 10 units daily.  Patient will definitely need insulin at d/c.  Will discuss with patient today and show her how to use insulin pen, etc.  Will follow closely.  Thanks  Adah Perl, RN, BC-ADM Inpatient Diabetes Coordinator Pager [phone]  Addendum: Spoke to patient regarding new diagnosis of DM.  She states that she is familiar with diabetes b/c of her husband.  She has a meter at home.  We discussed normal blood sugar values of 80-130 mg/dL and hypoglycemia <70 mg/dL.  We discussed hypoglycemia signs, symptoms, and treatment.  Teach back done.  Patient states that she does not drink sweetened beverages and exercises regularly including treadmill, yoga, etc. Educated patient on insulin pen use.  Reviewed all steps if insulin pen including attachment of needle, 2-unit air shot, dialing up dose, giving injection, removing needle, disposal of sharps, storage of unused insulin, disposal of insulin etc.  Patient able to provide successful return demonstration.   Also reviewed troubleshooting with insulin pen.  MD to give patient Rxs for insulin pens and insulin pen needles at discharge. Will also order insulin pen teaching kit and diabetes booklet.  Will follow.

## 2019-11-26 NOTE — Progress Notes (Signed)
Started teaching on how to give insulin injection. Very receptive to teaching, managed to give injection to abdomen with good technique.

## 2019-11-26 NOTE — Progress Notes (Signed)
Initial Nutrition Assessment  RD working remotely.  DOCUMENTATION CODES:   Not applicable  INTERVENTION:   - Continue Boost Breeze po TID, each supplement provides 250 kcal, 9 grams of protein, and 0 grams of fat  - Add Ensure Max po BID, each supplement provides 150 kcal, 30 grams of protein, and 1.5 grams of fat  If pt consumes 100% of above supplements, would be receiving 1050 total kcal and 87 total grams of protein daily from supplements alone (64% of kcal needs, 100% of protein needs).  - Encourage adequate PO intake at meals  NUTRITION DIAGNOSIS:   Increased nutrient needs related to altered GI function, acute illness as evidenced by estimated needs.  GOAL:   Patient will meet greater than or equal to 90% of their needs  MONITOR:   PO intake, Supplement acceptance, Labs, Weight trends, I & O's  REASON FOR ASSESSMENT:   Consult Assessment of nutrition requirement/status  ASSESSMENT:   79 year old female who presented to the ED on 10/10 with abdominal pain. PMH of newly diagnosed DM, GERD, HTN, HLD. Pt admitted with acute pancreatitis, DKA.   10/11 - clear liquid diet 10/13 - s/p colonoscopy and upper endoscopic ultrasound 10/14 - soft diet  Pt had an MRI/MRCP which revealed inflammatory stranding around the pancreas with a septated cystic lesion near the pancreatic neck. There were other small cystic lesions raising the question of IPMN. Colonoscopy on 10/13 showed 5 polyps (removed), 2 colonic angiectasias, and external and internal hemorrhoids. EGD/EUS on the same day showed "white nummular lesions in the esophageal mucosa, gastritis. Hematin in the entire stomach. One gastric polyp resected and retrieved, clips were placed. Pancreatic body lesion. Cystic lesion seen in the pancreatic head." Pancreatic cytology showing reactive cells felt secondary to inflammation. No malignancy found.  RD was unable to reach pt via phone call to room despite multiple attempts.  Meal completions improving over the last two days with most meal completions >50%. Cortrak NG tube for enteral nutrition was being considered but is currently on hold due to increase in PO intake.  Pt with Boost Breeze ordered TID and is accepting 50% of supplements per [facility] documentation. RD will also order Ensure Max BID to aid in meeting protein needs.  Admit weight: 65.3 kg Current weight: 70.5 kg  No weight history available PTA. Unable to obtain diet and weight history from pt at this time.  Meal Completion: 30-80% x last 6 meals  Medications reviewed and include: dulcolax, Boost Breeze TID, SSI with meals, Novolog 2 units TID with meals, lantus 10 units daily, protonix IVF: LR @ 75 ml/hr  Labs reviewed: hemoglobin 9.4 CBG's: 184-226 x 24 hours  UOP: 1350 ml x 24 hours I/O's: +8.0 L since admit  NUTRITION - FOCUSED PHYSICAL EXAM:  Unable to complete at this time. RD working remotely.  Diet Order:   Diet Order            DIET SOFT Room service appropriate? Yes; Fluid consistency: Thin  Diet effective now                 EDUCATION NEEDS:   Not appropriate for education at this time  Skin:  Skin Assessment: Reviewed RN Assessment  Last BM:  pt unsure of last BM  Height:   Ht Readings from Last 1 Encounters:  11/18/19 5\' 4"  (1.626 m)    Weight:   Wt Readings from Last 1 Encounters:  11/23/19 70.5 kg    BMI:  Body mass index  is 26.68 kg/m.  Estimated Nutritional Needs:   Kcal:  1650-1850  Protein:  85-100 grams  Fluid:  1.6-1.8 L    Gaynell Face, MS, RD, LDN Inpatient Clinical Dietitian Please see AMiON for contact information.

## 2019-11-26 NOTE — Plan of Care (Signed)
  Problem: Education: Goal: Knowledge of General Education information will improve Description: Including pain rating scale, medication(s)/side effects and non-pharmacologic comfort measures Outcome: Progressing   Problem: Health Behavior/Discharge Planning: Goal: Ability to manage health-related needs will improve Outcome: Progressing   Problem: Clinical Measurements: Goal: Diagnostic test results will improve Outcome: Progressing   Problem: Activity: Goal: Risk for activity intolerance will decrease Outcome: Progressing   Problem: Nutrition: Goal: Adequate nutrition will be maintained Outcome: Progressing   Problem: Elimination: Goal: Will not experience complications related to bowel motility Outcome: Progressing   Problem: Pain Managment: Goal: General experience of comfort will improve Outcome: Progressing

## 2019-11-26 NOTE — Progress Notes (Signed)
[facility] Progress Note  Chief Complaint: Acute pancreatitis  History:  Kloi had her husband at the bedside for the entire visit.  Her abdominal pain has decreased from yesterday and she is tolerating a regular diet without any increase in pain, and without nausea or vomiting.  She is very pleased with her progress hopes that we will continue and she will be able to go home soon. She had a bowel movement earlier today with no blood.  ROS: Cardiovascular: Denies chest pain Respiratory: Denies dyspnea Urinary: Denies dysuria  Objective:   Current Facility-Administered Medications:    bisacodyl (DULCOLAX) EC tablet 5 mg, 5 mg, Oral, Daily PRN, Cristal Ford, DO   bisacodyl (DULCOLAX) suppository 10 mg, 10 mg, Rectal, Once, Gribbin, Sarah J, PA-C   dextrose 50 % solution 0-50 mL, 0-50 mL, Intravenous, PRN, Marcelyn Bruins, MD   diltiazem (CARDIZEM CD) 24 hr capsule 120 mg, 120 mg, Oral, Daily, Marcelyn Bruins, MD, 120 mg at 11/26/19 3267   feeding supplement (BOOST / RESOURCE BREEZE) liquid 1 Container, 1 Container, Oral, TID BM, Nelida Meuse III, MD, 1 Container at 11/25/19 2031   HYDROmorphone (DILAUDID) injection 0.5 mg, 0.5 mg, Intravenous, Q6H PRN, Marcelyn Bruins, MD, 0.5 mg at 11/23/19 0441   insulin aspart (novoLOG) injection 0-15 Units, 0-15 Units, Subcutaneous, TID AC & HS, Shalhoub, Sherryll Burger, MD, 5 Units at 11/26/19 1228   insulin aspart (novoLOG) injection 2 Units, 2 Units, Subcutaneous, TID WC, Shalhoub, Sherryll Burger, MD, 2 Units at 11/26/19 1229   [START ON 11/27/2019] insulin glargine (LANTUS) injection 10 Units, 10 Units, Subcutaneous, Q24H, Mikhail, [place], DO   irbesartan (AVAPRO) tablet 75 mg, 75 mg, Oral, Daily, Marcelyn Bruins, MD, 75 mg at 11/26/19 1245   lactated ringers infusion, , Intravenous, Continuous, Little Ishikawa, MD, Last Rate: 75 mL/hr at 11/26/19 1300, Rate Verify at 11/26/19 1300   ondansetron (ZOFRAN) injection 4 mg, 4  mg, Intravenous, Q6H PRN, Shela Leff, MD, 4 mg at 11/26/19 8099   oxyCODONE (Oxy IR/ROXICODONE) immediate release tablet 5 mg, 5 mg, Oral, Q4H PRN, Cristal Ford, DO, 5 mg at 11/25/19 2140   pantoprazole (PROTONIX) EC tablet 40 mg, 40 mg, Oral, Daily, Vena Rua, PA-C, 40 mg at 11/26/19 8338   protein supplement (ENSURE MAX) liquid, 11 oz, Oral, BID WC, Mikhail, [place], DO   traMADol (ULTRAM) tablet 50 mg, 50 mg, Oral, Q6H PRN, Cristal Ford, DO, 50 mg at 11/26/19 2505   lactated ringers 75 mL/hr at 11/26/19 1300     Vital signs in last 24 hrs: Vitals:   11/26/19 0755 11/26/19 1154  BP: 125/65 (!) 139/45  Pulse: 65 67  Resp: 18 (!) 21  Temp: 97.9 F (36.6 C) 97.8 F (36.6 C)  SpO2: 97% 96%    Intake/Output Summary (Last 24 hours) at 11/26/2019 1419 Last data filed at 11/26/2019 1300 Gross per 24 hour  Intake 1924.74 ml  Output 1650 ml  Net 274.74 ml     Physical Exam Fatigued, in good spirits, conversational.  Sitting up in bed having just eaten  HEENT: sclera anicteric, oral mucosa without lesions  Neck: supple, no thyromegaly, JVD or lymphadenopathy  Cardiac: RRR without murmurs, S1S2 heard, no peripheral edema  Pulm: clear to auscultation bilaterally, normal RR and effort noted  Abdomen: soft, mild band-like upper abd tenderness, with active bowel sounds. No guarding or palpable hepatosplenomegaly  Skin; warm and dry, nojaundice  Recent Labs:  CBC Latest Ref Rng &  Units 11/26/2019 11/25/2019 11/24/2019  WBC 4.0 - 10.5 K/uL 9.4 17.0(H) 16.8(H)  Hemoglobin 12.0 - 15.0 g/dL 9.4(L) 9.8(L) 9.7(L)  Hematocrit 36 - 46 % 29.3(L) 29.8(L) 29.5(L)  Platelets 150 - 400 K/uL 299 299 279    No results for input(s): INR in the last 168 hours. CMP Latest Ref Rng & Units 11/26/2019 11/25/2019 11/24/2019  Glucose 70 - 99 mg/dL 258(H) 165(H) 174(H)  BUN 8 - 23 mg/dL 6(L) 5(L) 5(L)  Creatinine 0.44 - 1.00 mg/dL 0.56 0.37(L) 0.47  Sodium 135 - 145  mmol/L 136 132(L) 133(L)  Potassium 3.5 - 5.1 mmol/L 3.5 3.7 4.0  Chloride 98 - 111 mmol/L 101 98 101  CO2 22 - 32 mmol/L 25 23 23   Calcium 8.9 - 10.3 mg/dL 7.9(L) 7.7(L) 7.5(L)  Total Protein 6.5 - 8.1 g/dL - 5.1(L) -  Total Bilirubin 0.3 - 1.2 mg/dL - 0.9 -  Alkaline Phos 38 - 126 U/L - 206(H) -  AST 15 - 41 U/L - 13(L) -  ALT 0 - 44 U/L - 18 -     Assessment & Plan  Assessment: Acute pancreatitis of unclear cause.  There is mass in the mid pancreas that underwent EUS with FNA last week, biopsies did not show malignancy, but overall clinical scenario still concerning for that.  Post EUS biopsy exacerbation of pancreatitis with worsened pain, WBC and inflammatory findings on CT scan.  She is clinically improved from that.  I discussed her case with Dr. Rush Landmark again yesterday, who is interested in performing a repeat EUS with biopsy soon, perhaps during this admission.Ilamae is understandably concerned about that, especially given the worsening of pancreatitis afterwards.  She does understand that we have clinical concern for malignancy in this mass.  Plan: Continue current diet plan, I think we can stop the IV fluids as well. I will asked Dr. Rush Landmark to round on her when he is here tomorrow doing some procedures and have a discussion with her about whether or not to proceed with a repeat EUS and biopsy of the pancreatic lesion during this admission versus allow her to recover further and do it as an outpatient in the near future.   25 minutes were spent on this encounter (including chart review, history/exam, counseling/coordination of care, and documentation)    Nelida Meuse III Office: [phone]

## 2019-11-26 NOTE — Progress Notes (Signed)
PROGRESS NOTE    Nancy Hawkins  GLO:[id number] DOB: 1940-05-10 DOA: 11/17/2019 PCP: Nancy Hawkins., MD   Brief Narrative:  HPI on 11/17/2019 by Dr. Burnadette Peter is a 79 y.o. female with medical history significant of HTN and HLD who presents for worsening intermittent abdominal and back pain. She states she has had intermittent abdominal pain and low back pain for about 1 months. She has seen her primary care doctor a couple of times since the pain began. At one point she was diagnosed with diabetes and prescribed Metformin (which she did not tolerate and stopped taking), she was thought to have GERD and given a trial of PPI, which has not helped her symptoms. The pain has remained intermittent, but has progressively worsened to a 10/10 last night, prompting her to visit urgent care leading to a subsequent transfer to the ED. she does not report specific aggravating aggravating or alleviating factors.  Pain at times feels like it is radiating around both sides to her back. she has intermittent associated nausea without vomiting but does not necessarily correlate with her intermittent pain.  She also reports a degree of constipation for the last several days.  She denies vomiting, diarrhea, chest pain, shortness of breath.  She has never had a colonoscopy.  Interim history Patient admitted with DKA, was placed on insulin drip, which is now completed.  She is now on sliding scale.  She was also noted to have pancreatic mass/inflammation.  Gastroenterology was consulted, s/p colonoscopy and EUS. Advancing diet and attempting to control pain.  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  Question pseudocyst versus IPMNs. -Placed on IV fluids and currently n.p.o -lipase and LFTs normal -CA 19-9 elevated at 228 -Gastroenterology consulted and appreciated.  -S/p colonoscopy which showed 5 polyps ranging from 3 to 2 mm, removed with a cold snare.  2 colonic angiectasias, nonbleeding nonthrombosed external and internal hemorrhoids -Status post EGD/EUS: White nummular lesions in the esophageal mucosa, gastritis.  Hematin in the entire stomach.  One gastric polyp resected and retrieved, clips were placed.  Pancreatic body lesion.  Cystic lesion seen in the pancreatic head. -GI recommended PPI 40 mg twice daily for 1 month, then 40 mg thereafter -cytology and surgical pathology appear to be benign -patient able to tolerate soft diet -Discussed with gastroenterology, patient will likely need repeat EUS however will reassess on 11/25/2019 for further planning -holding off on NG tube as patient has been able to tolerate more PO intake -will consult nutrition  Back and abdominal pain -Likely secondary to the above, continue pain control with IV Dilaudid and as needed oxycodone -have added tramadol PRN -patient has been using more tramadol than oxy. Has not used IV pain meds in days.   Essential hypertension -BP currently stable, continue Avapro, Cardizem  Focal narrowing of ascending colon -Radiology read notes this may be due to peristalsis  -colonoscopy as above  Hypokalemia/hypomagnesemia -After aggressively replacing potassium, currently at 3.5 (will supplement) -Continue to monitor and replace as needed  Constipation -patient feels she is constipated- have ordered dulcolax  Deconditioning -PT consulted and recommended HH  DVT Prophylaxis SCDs  Code Status: Full  Family Communication: None at bedside  Disposition  Plan:  Status is: Inpatient  Remains inpatient appropriate because:Persistent severe electrolyte disturbances, Ongoing active pain requiring inpatient pain management, Ongoing diagnostic testing needed not appropriate for outpatient work up and Inpatient level of care appropriate due to severity of illness.  Continue electrolyte replacement.  Currently on soft diet. Pending further GI recommendations.   Dispo: The patient is from: Home              Anticipated d/c is to: Home with home health              Anticipated d/c date is: likely in 2 days              Patient currently is not medically stable to d/c.  Consultants Gastroenterology  Procedures  MRCP Colonoscopy EUS and EGD  Antibiotics   Anti-infectives (From admission, onward)   Start     Dose/Rate Route Frequency Ordered Stop   11/21/19 2200  ciprofloxacin (CIPRO) IVPB 400 mg  Status:  Discontinued        400 mg 200 mL/hr over 60 Minutes Intravenous Every 12 hours 11/20/19 1503 11/21/19 1351   11/21/19 2000  ciprofloxacin (CIPRO) tablet 500 mg  Status:  Discontinued        500 mg Oral 2 times daily 11/21/19 1351 11/21/19 1424   11/21/19 2000  ciprofloxacin (CIPRO) tablet 500 mg        500 mg Oral 2 times daily 11/21/19 1424 11/23/19 9323      Subjective:   Nancy Hawkins seen and examined today.  Patient with minimal abdominal pain.  Denies any nausea at this time.  Does complain of constipation.  Denies chest pain or shortness of breath, dizziness or headache.    Objective:   Vitals:   11/25/19 2335 11/26/19 0329 11/26/19 0728 11/26/19 0755  BP: (!) 124/58 (!) 114/43  125/65  Pulse: 78 65  65  Resp: 19 12 14 18   Temp: 97.8 F (36.6 C) 97.8 F (36.6 C)  97.9 F (36.6 C)  TempSrc: Oral Oral  Oral  SpO2: 96%   97%  Weight:      Height:        Intake/Output Summary (Last 24 hours) at 11/26/2019 1047 Last data filed at 11/26/2019 0900 Gross per 24 hour  Intake 2064.74 ml  Output 1650 ml  Net 414.74 ml    Filed Weights   11/18/19 0025 11/18/19 2047 11/23/19 0342  Weight: 63 kg 66 kg 70.5 kg   Exam  General: Well developed, well nourished, NAD, appears stated age  65: NCAT,  mucous membranes moist.   Cardiovascular: S1 S2 auscultated, RRR, no murmur appreciated  Respiratory: Diminished breath sounds however clear  Abdomen: Soft, nontender, nondistended, + bowel sounds  Extremities: warm dry without cyanosis clubbing or edema  Neuro: AAOx3, nonfocal  Psych: appropriate mood and affect, pleasant  Data Reviewed: I have personally reviewed following labs and imaging studies  CBC: Recent Labs  Lab 11/22/19 0101 11/23/19 0032 11/24/19 0052 11/25/19 0116 11/26/19 0041  WBC 14.9* 14.8* 16.8* 17.0* 9.4  HGB 9.6* 9.6* 9.7* 9.8* 9.4*  HCT 28.7* 29.7* 29.5* 29.8* 29.3*  MCV 85.7 85.3 84.3 83.9 85.9  PLT 245 248 279 299 557   Basic Metabolic Panel: Recent Labs  Lab 11/21/19 0037 11/21/19 0037 11/22/19 0101 11/23/19 0032 11/24/19 0052 11/25/19 0116 11/26/19 0041  NA 134*   < >  134* 132* 133* 132* 136  K 2.8*   < > 4.1 3.5 4.0 3.7 3.5  CL 104   < > 103 100 101 98 101  CO2 19*   < > 24 22 23 23 25   GLUCOSE 160*   < > 186* 144* 174* 165* 258*  BUN 5*   < > 6* 6* 5* 5* 6*  CREATININE 0.50   < > 0.38* 0.41* 0.47 0.37* 0.56  CALCIUM 7.4*   < > 7.5* 7.6* 7.5* 7.7* 7.9*  MG 1.7  --  1.7 1.8 2.0 1.7  --    < > = values in this interval not displayed.   GFR: Estimated Creatinine Clearance: 54.9 mL/min (by C-G formula based on SCr of 0.56 mg/dL). Liver Function Tests: Recent Labs  Lab 11/25/19 0116  AST 13*  ALT 18  ALKPHOS 206*  BILITOT 0.9  PROT 5.1*  ALBUMIN 1.5*   No results for input(s): LIPASE, AMYLASE in the last 168 hours. No results for input(s): AMMONIA in the last 168 hours. Coagulation Profile: No results for input(s): INR, PROTIME in the last 168 hours. Cardiac Enzymes: No results for input(s): CKTOTAL, CKMB, CKMBINDEX, TROPONINI in the last 168  hours. BNP (last 3 results) No results for input(s): PROBNP in the last 8760 hours. HbA1C: No results for input(s): HGBA1C in the last 72 hours. CBG: Recent Labs  Lab 11/25/19 0617 11/25/19 1126 11/25/19 1612 11/25/19 2105 11/26/19 0626  GLUCAP 166* 121* 184* 226* 208*   Lipid Profile: No results for input(s): CHOL, HDL, LDLCALC, TRIG, CHOLHDL, LDLDIRECT in the last 72 hours. Thyroid Function Tests: No results for input(s): TSH, T4TOTAL, FREET4, T3FREE, THYROIDAB in the last 72 hours. Anemia Panel: No results for input(s): VITAMINB12, FOLATE, FERRITIN, TIBC, IRON, RETICCTPCT in the last 72 hours. Urine analysis:    Component Value Date/Time   COLORURINE YELLOW 11/17/2019 1337   APPEARANCEUR CLEAR 11/17/2019 1337   LABSPEC 1.024 11/17/2019 1337   PHURINE 5.0 11/17/2019 1337   GLUCOSEU >=500 (A) 11/17/2019 1337   HGBUR NEGATIVE 11/17/2019 1337   BILIRUBINUR NEGATIVE 11/17/2019 1337   BILIRUBINUR small (A) 11/17/2019 0906   KETONESUR 80 (A) 11/17/2019 1337   PROTEINUR 30 (A) 11/17/2019 1337   UROBILINOGEN 0.2 11/17/2019 0906   NITRITE NEGATIVE 11/17/2019 1337   LEUKOCYTESUR NEGATIVE 11/17/2019 1337   Sepsis Labs: @LABRCNTIP (procalcitonin:4,lacticidven:4)  ) Recent Results (from the past 240 hour(s))  Respiratory Panel by RT PCR (Flu A&B, Covid) - Nasopharyngeal Swab     Status: None   Collection Time: 11/17/19  5:35 PM   Specimen: Nasopharyngeal Swab  Result Value Ref Range Status   SARS Coronavirus 2 by RT PCR NEGATIVE NEGATIVE Final    Comment: (NOTE) SARS-CoV-2 target nucleic acids are NOT DETECTED.  The SARS-CoV-2 RNA is generally detectable in upper respiratoy specimens during the acute phase of infection. The lowest concentration of SARS-CoV-2 viral copies this assay can detect is 131 copies/mL. A negative result does not preclude SARS-Cov-2 infection and should not be used as the sole basis for treatment or other patient management decisions. A negative  result may occur with  improper specimen collection/handling, submission of specimen other than nasopharyngeal swab, presence of viral mutation(s) within the areas targeted by this assay, and inadequate number of viral copies (<131 copies/mL). A negative result must be combined with clinical observations, patient history, and epidemiological information. The expected result is Negative.  Fact Sheet for Patients:  PinkCheek.be  Fact Sheet for Healthcare Providers:  GravelBags.it  This test is no t yet approved or cleared by the [place] and  has been authorized for detection and/or diagnosis of SARS-CoV-2 by FDA under an Emergency Use Authorization (EUA). This EUA will remain  in effect (meaning this test can be used) for the duration of the COVID-19 declaration under Section 564(b)(1) of the Act, 21 U.S.C. section 360bbb-3(b)(1), unless the authorization is terminated or revoked sooner.     Influenza A by PCR NEGATIVE NEGATIVE Final   Influenza B by PCR NEGATIVE NEGATIVE Final    Comment: (NOTE) The Xpert Xpress SARS-CoV-2/FLU/RSV assay is intended as an aid in  the diagnosis of influenza from Nasopharyngeal swab specimens and  should not be used as a sole basis for treatment. Nasal washings and  aspirates are unacceptable for Xpert Xpress SARS-CoV-2/FLU/RSV  testing.  Fact Sheet for Patients: PinkCheek.be  Fact Sheet for Healthcare Providers: GravelBags.it  This test is not yet approved or cleared by the [place] FDA and  has been authorized for detection and/or diagnosis of SARS-CoV-2 by  FDA under an Emergency Use Authorization (EUA). This EUA will remain  in effect (meaning this test can be used) for the duration of the  Covid-19 declaration under Section 564(b)(1) of the Act, 21  U.S.C. section 360bbb-3(b)(1), unless the authorization is    terminated or revoked. Performed at [facility], [place] [address], [place], [place] [address]   MRSA PCR Screening     Status: None   Collection Time: 11/18/19  8:56 PM   Specimen: Nasal Mucosa; Nasopharyngeal  Result Value Ref Range Status   MRSA by PCR NEGATIVE NEGATIVE Final    Comment:        The GeneXpert MRSA Assay (FDA approved for NASAL specimens only), is one component of a comprehensive MRSA colonization surveillance program. It is not intended to diagnose MRSA infection nor to guide or monitor treatment for MRSA infections. Performed at [facility], [place] [address], [place], [place] [address]       Radiology Studies: CT ABDOMEN PELVIS W CONTRAST  Result Date: 11/24/2019 CLINICAL DATA:  79 year old female with abdominal pain. EXAM: CT ABDOMEN AND PELVIS WITH CONTRAST TECHNIQUE: Multidetector CT imaging of the abdomen and pelvis was performed using the standard protocol following bolus administration of intravenous contrast. CONTRAST:  14mL OMNIPAQUE IOHEXOL 300 MG/ML  SOLN COMPARISON:  CT abdomen pelvis dated 11/17/2019. FINDINGS: Lower chest: Partially visualized small bilateral pleural effusions with bilateral lower lobe subsegmental consolidation which may represent atelectasis. Pneumonia is not excluded. Clinical correlation is recommended. There is coronary vascular calcification. No intra-abdominal free air.  Small ascites, new since the prior CT. Hepatobiliary: The liver is unremarkable. No intrahepatic biliary ductal dilatation. Cholecystectomy. No retained calcified stone noted in the central CBD. Pancreas: There is inflammatory changes of the pancreas in keeping with pancreatitis. There are multiple peripancreatic collections consistent with developing pseudocysts or abscesses. These collections have increased in size compared to the prior CT and measure up to 4.7 x 3.2 cm in greatest combined dimensions (32/3). There is extension of the pseudocyst into  the posterior wall of the body of the stomach. There may be penetration through the gastric wall into the lumen of the stomach (31/3) representing early marsupialization. Spleen: Normal in size without focal abnormality. Adrenals/Urinary Tract: The adrenal glands unremarkable. There is no hydronephrosis on either side. There is symmetric enhancement and excretion of contrast by both kidneys. There is a 1 cm left renal inferior pole cyst. Additional  1 cm exophytic hypodense lesion from the lateral interpolar left kidney noted, likely a cyst. The visualized ureters and urinary bladder appear unremarkable. Stomach/Bowel: There is inflammatory changes of the gastric wall secondary to pancreatitis. There is a metallic clip in the distal stomach which is new since the prior CT and likely representing an ingested foreign object. There is an apparent focal area of loss of integrity of the gastric mucosa anteriorly (axial 32/3 and coronal 28/6). No free air or evidence of perforation. There is extensive sigmoid diverticulosis without active inflammatory changes. There is moderate stool throughout the colon. There is no bowel obstruction. The appendix is normal. Vascular/Lymphatic: Mild aortoiliac atherosclerotic disease. The IVC is unremarkable. No portal venous gas. 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 There is no adenopathy. Reproductive: Hysterectomy. Other: Diffuse subcutaneous edema. Musculoskeletal: Osteopenia with degenerative changes of the spine. Multilevel old compression fractures. No acute osseous pathology. IMPRESSION: 1. Acute pancreatitis with multiple developing pseudocysts/abscesses which have increased in size since the prior CT. 2. Preparation of the pancreatic pseudocyst into the gastric wall. Continued follow-up recommended. 3. Occlusion of the splenic vein. 4. Small ascites, new since the prior CT. 5. Small bilateral pleural effusions with bilateral  lower lobe subsegmental atelectasis or infiltrate. 6. A metallic clip in the distal stomach likely representing an ingested foreign object. 7. Sigmoid diverticulosis. No bowel obstruction. Normal appendix. 8. Aortic Atherosclerosis (ICD10-I70.0). Electronically Signed   By: Anner Crete M.D.   On: 11/24/2019 19:46     Scheduled Meds: . bisacodyl  10 mg Rectal Once  . diltiazem  120 mg Oral Daily  . feeding supplement  1 Container Oral TID BM  . insulin aspart  0-15 Units Subcutaneous TID AC & HS  . insulin aspart  2 Units Subcutaneous TID WC  . [START ON 11/27/2019] insulin glargine  10 Units Subcutaneous Q24H  . irbesartan  75 mg Oral Daily  . pantoprazole  40 mg Oral Daily   Continuous Infusions: . lactated ringers 75 mL/hr at 11/26/19 0958     LOS: 9 days   Time Spent in minutes   30 minutes  Janica Eldred D.O. on 11/26/2019 at 10:47 AM  Between 7am to 7pm - Please see pager noted on [email or web address]  After 7pm go to [email or web address]  And look for the night coverage person covering for me after hours  [facility] Office  [phone]

## 2019-11-27 DIAGNOSIS — K8500 Idiopathic acute pancreatitis without necrosis or infection: Secondary | ICD-10-CM | POA: Diagnosis not present

## 2019-11-27 DIAGNOSIS — R110 Nausea: Secondary | ICD-10-CM | POA: Diagnosis not present

## 2019-11-27 DIAGNOSIS — E1010 Type 1 diabetes mellitus with ketoacidosis without coma: Secondary | ICD-10-CM | POA: Diagnosis not present

## 2019-11-27 DIAGNOSIS — K8689 Other specified diseases of pancreas: Secondary | ICD-10-CM | POA: Diagnosis not present

## 2019-11-27 DIAGNOSIS — K859 Acute pancreatitis without necrosis or infection, unspecified: Secondary | ICD-10-CM | POA: Diagnosis not present

## 2019-11-27 LAB — CBC
HCT: 29.9 % — ABNORMAL LOW (ref 36.0–46.0)
Hemoglobin: 9.6 g/dL — ABNORMAL LOW (ref 12.0–15.0)
MCH: 28 pg (ref 26.0–34.0)
MCHC: 32.1 g/dL (ref 30.0–36.0)
MCV: 87.2 fL (ref 80.0–100.0)
Platelets: 333 10*3/uL (ref 150–400)
RBC: 3.43 MIL/uL — ABNORMAL LOW (ref 3.87–5.11)
RDW: 14.4 % (ref 11.5–15.5)
WBC: 8.8 10*3/uL (ref 4.0–10.5)
nRBC: 0 % (ref 0.0–0.2)

## 2019-11-27 LAB — GLUCOSE, CAPILLARY
Glucose-Capillary: 178 mg/dL — ABNORMAL HIGH (ref 70–99)
Glucose-Capillary: 175 mg/dL — ABNORMAL HIGH (ref 70–99)
Glucose-Capillary: 175 mg/dL — ABNORMAL HIGH (ref 70–99)
Glucose-Capillary: 188 mg/dL — ABNORMAL HIGH (ref 70–99)

## 2019-11-27 LAB — BASIC METABOLIC PANEL
Anion gap: 10 (ref 5–15)
BUN: 6 mg/dL — ABNORMAL LOW (ref 8–23)
CO2: 25 mmol/L (ref 22–32)
Calcium: 8.1 mg/dL — ABNORMAL LOW (ref 8.9–10.3)
Chloride: 103 mmol/L (ref 98–111)
Creatinine, Ser: 0.52 mg/dL (ref 0.44–1.00)
GFR, Estimated: >60 mL/min (ref >60)
Glucose, Bld: 212 mg/dL — ABNORMAL HIGH (ref 70–99)
Potassium: 3.7 mmol/L (ref 3.5–5.1)
Sodium: 138 mmol/L (ref 135–145)

## 2019-11-27 LAB — MAGNESIUM: Magnesium: 1.7 mg/dL (ref 1.7–2.4)

## 2019-11-27 MED ORDER — MAGNESIUM SULFATE 2 GM/50ML IV SOLN
2.0000 g | Freq: Once | INTRAVENOUS | Status: AC
  Administered 2019-11-27: 2 g via INTRAVENOUS
  Filled 2019-11-27: qty 50

## 2019-11-27 MED ORDER — ALUM & MAG HYDROXIDE-SIMETH 200-200-20 MG/5ML PO SUSP
30.0000 mL | ORAL | Status: DC | PRN
  Administered 2019-11-27: 30 mL via ORAL
  Filled 2019-11-27: qty 30

## 2019-11-27 MED ORDER — FLUTICASONE PROPIONATE 50 MCG/ACT NA SUSP
2.0000 | Freq: Every day | NASAL | Status: DC
  Administered 2019-11-28: 2 via NASAL
  Filled 2019-11-27: qty 16

## 2019-11-27 NOTE — Plan of Care (Signed)
Nutrition Education Note  RD consulted for nutrition education regarding diabetes and pancreatitis.  Spoke with pt at bedside. She reports appetite is improved today. Pt ate most of a pasta dish brought in by family. She states that this is more palatable than the hospital food which is often cold when she receives it.  Pt reports that she typically eats well at home (prior to pancreatitis episode). She has been making some changes to help manage her diabetes due to new diabetes diagnosis (likely brought on by pancreatitis per notes). Pt typically eats 3 meals daily.  Breakfast: plain cheerios with lactose-free milk OR eggs with mushrooms and peppers Lunch (biggest meal of the day): tuna, cottage cheese, avocado, grapes, crackers Dinner: roasted vegetables, may have a protein like chicken  Lab Results  Component Value Date   HGBA1C 11.0 (H) 11/17/2019   RD provided "Carbohydrate Counting for People with Diabetes" handout from the Academy of Nutrition and Dietetics. Discussed different food groups and their effects on blood sugar, emphasizing carbohydrate-containing foods. Provided list of carbohydrates and recommended serving sizes of common foods.  Discussed importance of controlled and consistent carbohydrate intake throughout the day. Provided examples of ways to balance meals/snacks and encouraged intake of high-fiber, whole grain complex carbohydrates.  RD also discussed low fat vs high fat foods. Advised patient to eat multiple small meals and to avoid high fat foods, spicy foods, and alcohol. Educated pt on the importance of adequate protein intake needed to preserve lean muscle.  Teach back method used.  Expect good compliance.  RD is following pt during acute admission. Please see Initial Nutrition Assessment from yesterday, 11/26/19. Nutrition interventions are in place. Will continue to follow pt during admission.   Gaynell Face, MS, RD, LDN Inpatient Clinical  Dietitian Please see AMiON for contact information.

## 2019-11-27 NOTE — Progress Notes (Signed)
Daily Rounding Note  11/27/2019, 2:07 PM  LOS: 10 days   SUBJECTIVE:   Chief complaint: pancreatitis, abd pain.  Cystic pancreatic lesion and speudocysts.      Eating ~ 50% of soft diet.  Ate more than that at lunch today, food brought from outside the hospital and more palatable Abdominal and back pain not present today.  Feels better.  No nausea  OBJECTIVE:         Vital signs in last 24 hours:    Temp:  [97.7 F (36.5 C)-98.7 F (37.1 C)] 98.1 F (36.7 C) (10/20 1111) Pulse Rate:  [70-80] 70 (10/20 1111) Resp:  [13-20] 15 (10/20 1111) BP: (122-144)/(61-68) 144/66 (10/20 1111) SpO2:  [96 %-99 %] 99 % (10/20 0748) Last BM Date: 11/26/19 Filed Weights   11/18/19 0025 11/18/19 2047 11/23/19 0342  Weight: 63 kg 66 kg 70.5 kg   General: Looks much better than the last time I saw her over the weekend. Heart: RRR Chest: No labored breathing or cough Abdomen: Soft.  Not tender, not distended.  Active bowel sounds. Extremities: No CCE. Neuro/Psych: Mood/affect much improved.  Has really made a turnaround  Intake/Output from previous day: 10/19 0701 - 10/20 0700 In: 1050 [P.O.:600; I.V.:450] Out: 300 [Urine:300]  Intake/Output this shift: No intake/output data recorded.  Lab Results: Recent Labs    11/25/19 0116 11/26/19 0041 11/27/19 0044  WBC 17.0* 9.4 8.8  HGB 9.8* 9.4* 9.6*  HCT 29.8* 29.3* 29.9*  PLT 299 299 333   BMET Recent Labs    11/25/19 0116 11/26/19 0041 11/27/19 0044  NA 132* 136 138  K 3.7 3.5 3.7  CL 98 101 103  CO2 23 25 25   GLUCOSE 165* 258* 212*  BUN 5* 6* 6*  CREATININE 0.37* 0.56 0.52  CALCIUM 7.7* 7.9* 8.1*   LFT Recent Labs    11/25/19 0116  PROT 5.1*  ALBUMIN 1.5*  AST 13*  ALT 18  ALKPHOS 206*  BILITOT 0.9   PT/INR No results for input(s): LABPROT, INR in the last 72 hours. Hepatitis Panel No results for input(s): HEPBSAG, HCVAB, HEPAIGM, HEPBIGM in the last  72 hours.  Studies/Results: No results found.   Scheduled Meds: . bisacodyl  10 mg Rectal Once  . diltiazem  120 mg Oral Daily  . feeding supplement  1 Container Oral TID BM  . fluticasone  2 spray Each Nare Daily  . insulin aspart  0-15 Units Subcutaneous TID AC & HS  . insulin aspart  2 Units Subcutaneous TID WC  . insulin glargine  10 Units Subcutaneous Q24H  . irbesartan  75 mg Oral Daily  . pantoprazole  40 mg Oral Daily  . Ensure Max Protein  11 oz Oral BID WC   Continuous Infusions: . magnesium sulfate bolus IVPB     PRN Meds:.alum & mag hydroxide-simeth, bisacodyl, dextrose, ondansetron (ZOFRAN) IV, oxyCODONE, traMADol   ASSESMENT:   *    Acute pancreatitis, cause undetermined.  Pancreatitis at some point prior to admission but never diagnosed until recent events and imaging.  Persistent abdominal and back pain following EUS and 10/17 CTAP showing acute pancreatitis, increasing size of pseudocyst/abscesses, occluded splenic vein, new ascites, small pleural effusions.  Diffuse  SQ edema.  Metallic clip in distal stomach likely ingested foreign object. Mid pancreatic mass, EUS, FNA w reactive, nonmalignant cytology.  However clinical scenario is concerning for malignancy. Leukocytosis resolved.  No antibiotics in place.Marland Kitchen  *  Hypoalbuminemia.  RD working with patient.   PLAN   *    Await Dr. Donneta Romberg opinion.   Hoping this turnaround in her symptoms and mood persists, only time will tell.    Nancy Hawkins  11/27/2019, 2:07 PM Phone [phone]

## 2019-11-27 NOTE — Progress Notes (Addendum)
Physical Therapy Treatment Patient Details Name: Nancy Hawkins MRN: [id number] DOB: Jan 03, 1941 Today's Date: 11/27/2019    History of Present Illness is a 79 y.o. female with medical history significant of HTN and HLD who presents for worsening intermittent abdominal and back pain. She states she has had intermittent abdominal pain and low back pain for about 1 months. She has seen her primary care doctor a couple of times since the pain began. At one point she was diagnosed with diabetes and prescribed Metformin (which she did not tolerate and stopped taking), she was thought to have GERD and given a trial of PPI, which has not helped her symptoms. The pain has remained intermittent, but has progressively worsened to a 10/10 last night, prompting her to visit urgent care leading to a subsequent transfer to the ED. She was found to be in DKA and was noted to have a pancreatic mass/inflammation.     PT Comments    Pt supine in bed on entry, reports that sinus headache is still there but not as bad as this morning. Pt agreeable to ambulation in hallway. Pt is limited in safe mobility by decreased exercise tolerance. Pt is supervision for bed mobility and transfers and min guard for ambulation in hallway. D/c plan remains appropriate at this time however HHPT may not be necessary if pt continues to progress.    Follow Up Recommendations  Home health PT;Supervision/Assistance - 24 hour     Equipment Recommendations  None recommended by PT       Precautions / Restrictions Precautions Precautions: Fall    Mobility  Bed Mobility Overal bed mobility: Needs Assistance Bed Mobility: Supine to Sit;Sit to Supine     Supine to sit: Supervision Sit to supine: Supervision   General bed mobility comments: supervision for safety   Transfers Overall transfer level: Needs assistance Equipment used: None Transfers: Sit to/from Stand Sit to Stand: Min guard;Supervision         General transfer  comment: min guard for initial power up from bed, subsequent sit<>stand from bed and low toilet   Ambulation/Gait Ambulation/Gait assistance: Min guard Gait Distance (Feet): 40 Feet (2x40) Assistive device: None Gait Pattern/deviations: Decreased stride length;Trunk flexed;Step-through pattern Gait velocity: Decreased   General Gait Details: min guard for safety, ambulation limited by tele box malfunction        Balance Overall balance assessment: Needs assistance Sitting-balance support: Feet supported;No upper extremity supported Sitting balance-Leahy Scale: Fair     Standing balance support: During functional activity;No upper extremity supported;Single extremity supported;Bilateral upper extremity supported Standing balance-Leahy Scale: Fair Standing balance comment: able to static stand at sink to brush teeth                            Cognition Arousal/Alertness: Awake/alert Behavior During Therapy: WFL for tasks assessed/performed Overall Cognitive Status: Within Functional Limits for tasks assessed                                           General Comments General comments (skin integrity, edema, etc.): Pt telebox flashing on and off registering HR 196, 183, 167 and alarming, returned to room replaced leads and HR more in line in high 110s but RR ranging 50-67, pt obviously not breathing at that rate, pt requested return to use bathroom, pt assymptomatic throughout session  Pertinent Vitals/Pain Pain Assessment: Faces Faces Pain Scale: Hurts a little bit Pain Descriptors / Indicators: Headache Pain Intervention(s): Limited activity within patient's tolerance;Monitored during session;Repositioned           PT Goals (current goals can now be found in the care plan section) Acute Rehab PT Goals Patient Stated Goal: Feel better PT Goal Formulation: With patient/family Time For Goal Achievement: 11/30/19 Potential to Achieve Goals:  Good Progress towards PT goals: Progressing toward goals    Frequency    Min 3X/week      PT Plan Current plan remains appropriate       AM-PAC PT "6 Clicks" Mobility   Outcome Measure  Help needed turning from your back to your side while in a flat bed without using bedrails?: None Help needed moving from lying on your back to sitting on the side of a flat bed without using bedrails?: None Help needed moving to and from a bed to a chair (including a wheelchair)?: None Help needed standing up from a chair using your arms (e.g., wheelchair or bedside chair)?: None Help needed to walk in hospital room?: None Help needed climbing 3-5 steps with a railing? : A Lot 6 Click Score: 22    End of Session Equipment Utilized During Treatment: Gait belt Activity Tolerance: Patient tolerated treatment well (sinus headache) Patient left: in bed;with call bell/phone within reach Nurse Communication: Mobility status PT Visit Diagnosis: Unsteadiness on feet (R26.81);Muscle weakness (generalized) (M62.81) Pain - part of body:  (headache)     Time: 7616-0737 PT Time Calculation (min) (ACUTE ONLY): 19 min  Charges:  $Therapeutic Exercise: 8-22 mins                     Eudell Mcphee B. Migdalia Dk PT, DPT Acute Rehabilitation Services Pager [phone] Office [phone]    [place] 11/27/2019, 3:05 PM

## 2019-11-27 NOTE — Progress Notes (Signed)
Progress Note    Nancy Hawkins  BZJ:[id number] DOB: May 28, 1940  DOA: 11/17/2019 PCP: Nancy Coma., MD    Brief Narrative:     Medical records reviewed and are as summarized below:  Nancy Hawkins is an 79 y.o. female admitted with DKA, was placed on insulin drip, which is now completed.  She is now on sliding scale.  She was also noted to have pancreatic mass/inflammation.  Gastroenterology was consulted, s/p colonoscopy and EUS. Advancing diet and attempting to control pain.   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  Question pseudocyst versus IPMNs. -lipase and LFTs normal -CA 19-9 elevated at 228 -Gastroenterology consulted and appreciated.  -S/p colonoscopy which showed 5 polyps ranging from 3 to 2 mm, removed with a cold snare.  2 colonic angiectasias, nonbleeding nonthrombosed external and internal hemorrhoids -Status post EGD/EUS: White nummular lesions in the esophageal mucosa, gastritis.  Hematin in the entire stomach.  One gastric polyp resected and retrieved, clips were placed.  Pancreatic body lesion.  Cystic lesion seen in the pancreatic head. -GI recommended PPI 40 mg twice daily for 1 month, then 40 mg thereafter -cytology and surgical pathology appear to be benign -patient able to tolerate soft diet -Discussed with gastroenterology, patient will likely need repeat EUS however will reassess on 11/27/2019 for  further planning  Back and abdominal pain -d/c dilaudid -oxy/ tramadol PRN  Essential hypertension -BP currently stable, continue Avapro, Cardizem  Hypokalemia/hypomagnesemia -replete and recheck  Constipation -bowel regimen  Deconditioning -PT consulted and recommended [place]   Family Communication/Anticipated D/C date and plan/Code Status   DVT prophylaxis: scd/ambulation Code Status: Full Code.  Family Communication: at bedside Disposition Plan: Status is: Inpatient  Remains inpatient appropriate because:Inpatient level of care appropriate due to severity of illness   Dispo: The patient is from: Home              Anticipated d/c is to: Home              Anticipated d/c date is: 2 days              Patient currently is not medically stable to d/c.- await final recommendations from GI         Medical Consultants:    GI  Subjective:   C/o sinus pain  Objective:    Vitals:   11/26/19 2357 11/27/19 0408 11/27/19 0748 11/27/19 1111  BP: 122/61 125/61 138/68 (!) 144/66  Pulse: 80 72 77 70  Resp: 20 13 16 15   Temp: 97.9 F (36.6 C) 97.8 F (36.6 C) 98 F (36.7 C) 98.1 F (36.7 C)  TempSrc: Oral Oral Oral Oral  SpO2: 99% 99% 99%   Weight:      Height:        Intake/Output Summary (Last 24 hours) at 11/27/2019 1324 Last data filed at 11/27/2019 0408 Gross per 24 hour  Intake 480 ml  Output --  Net 480 ml   Filed Weights   11/18/19  0025 11/18/19 2047 11/23/19 0342  Weight: 63 kg 66 kg 70.5 kg    Exam:  General: Appearance:     Overweight female in no acute distress     Lungs:     respirations unlabored  Heart:    Normal heart rate. Normal rhythm. No murmurs, rubs, or gallops.   MS:   All extremities are intact.   Neurologic:   Awake, alert, oriented x 3. No apparent focal neurological           defect.     Data Reviewed:   I have personally reviewed following labs and imaging studies:  Labs: Labs show the following:   Basic  Metabolic Panel: Recent Labs  Lab 11/22/19 0101 11/22/19 0101 11/23/19 0032 11/23/19 0032 11/24/19 0052 11/24/19 0052 11/25/19 0116 11/25/19 0116 11/26/19 0041 11/27/19 0044  NA 134*   < > 132*  --  133*  --  132*  --  136 138  K 4.1   < > 3.5   < > 4.0   < > 3.7   < > 3.5 3.7  CL 103   < > 100  --  101  --  98  --  101 103  CO2 24   < > 22  --  23  --  23  --  25 25  GLUCOSE 186*   < > 144*  --  174*  --  165*  --  258* 212*  BUN 6*   < > 6*  --  5*  --  5*  --  6* 6*  CREATININE 0.38*   < > 0.41*  --  0.47  --  0.37*  --  0.56 0.52  CALCIUM 7.5*   < > 7.6*  --  7.5*  --  7.7*  --  7.9* 8.1*  MG 1.7  --  1.8  --  2.0  --  1.7  --   --  1.7   < > = values in this interval not displayed.   GFR Estimated Creatinine Clearance: 54.9 mL/min (by C-G formula based on SCr of 0.52 mg/dL). Liver Function Tests: Recent Labs  Lab 11/25/19 0116  AST 13*  ALT 18  ALKPHOS 206*  BILITOT 0.9  PROT 5.1*  ALBUMIN 1.5*   No results for input(s): LIPASE, AMYLASE in the last 168 hours. No results for input(s): AMMONIA in the last 168 hours. Coagulation profile No results for input(s): INR, PROTIME in the last 168 hours.  CBC: Recent Labs  Lab 11/23/19 0032 11/24/19 0052 11/25/19 0116 11/26/19 0041 11/27/19 0044  WBC 14.8* 16.8* 17.0* 9.4 8.8  HGB 9.6* 9.7* 9.8* 9.4* 9.6*  HCT 29.7* 29.5* 29.8* 29.3* 29.9*  MCV 85.3 84.3 83.9 85.9 87.2  PLT 248 279 299 299 333   Cardiac Enzymes: No results for input(s): CKTOTAL, CKMB, CKMBINDEX, TROPONINI in the last 168 hours. BNP (last 3 results) No results for input(s): PROBNP in the last 8760 hours. CBG: Recent Labs  Lab 11/26/19 1159 11/26/19 1610 11/26/19 2114 11/27/19 0617 11/27/19 1111  GLUCAP 225* 170* 233* 178* 175*   D-Dimer: No results for input(s): DDIMER in the last 72 hours. Hgb A1c: No results for input(s): HGBA1C in the last 72 hours. Lipid Profile: No results for input(s): CHOL, HDL, LDLCALC, TRIG, CHOLHDL,  LDLDIRECT in the last 72 hours. Thyroid function studies: No results for input(s): TSH, T4TOTAL, T3FREE, THYROIDAB in the last 72 hours.  Invalid input(s): FREET3 Anemia work up: No results for  input(s): VITAMINB12, FOLATE, FERRITIN, TIBC, IRON, RETICCTPCT in the last 72 hours. Sepsis Labs: Recent Labs  Lab 11/24/19 0052 11/25/19 0116 11/26/19 0041 11/27/19 0044  WBC 16.8* 17.0* 9.4 8.8    Microbiology Recent Results (from the past 240 hour(s))  Respiratory Panel by RT PCR (Flu A&B, Covid) - Nasopharyngeal Swab     Status: None   Collection Time: 11/17/19  5:35 PM   Specimen: Nasopharyngeal Swab  Result Value Ref Range Status   SARS Coronavirus 2 by RT PCR NEGATIVE NEGATIVE Final    Comment: (NOTE) SARS-CoV-2 target nucleic acids are NOT DETECTED.  The SARS-CoV-2 RNA is generally detectable in upper respiratoy specimens during the acute phase of infection. The lowest concentration of SARS-CoV-2 viral copies this assay can detect is 131 copies/mL. A negative result does not preclude SARS-Cov-2 infection and should not be used as the sole basis for treatment or other patient management decisions. A negative result may occur with  improper specimen collection/handling, submission of specimen other than nasopharyngeal swab, presence of viral mutation(s) within the areas targeted by this assay, and inadequate number of viral copies (<131 copies/mL). A negative result must be combined with clinical observations, patient history, and epidemiological information. The expected result is Negative.  Fact Sheet for Patients:  PinkCheek.be  Fact Sheet for Healthcare Providers:  GravelBags.it  This test is no t yet approved or cleared by the [place] FDA and  has been authorized for detection and/or diagnosis of SARS-CoV-2 by FDA under an Emergency Use Authorization (EUA). This EUA will remain  in effect (meaning this  test can be used) for the duration of the COVID-19 declaration under Section 564(b)(1) of the Act, 21 U.S.C. section 360bbb-3(b)(1), unless the authorization is terminated or revoked sooner.     Influenza A by PCR NEGATIVE NEGATIVE Final   Influenza B by PCR NEGATIVE NEGATIVE Final    Comment: (NOTE) The Xpert Xpress SARS-CoV-2/FLU/RSV assay is intended as an aid in  the diagnosis of influenza from Nasopharyngeal swab specimens and  should not be used as a sole basis for treatment. Nasal washings and  aspirates are unacceptable for Xpert Xpress SARS-CoV-2/FLU/RSV  testing.  Fact Sheet for Patients: PinkCheek.be  Fact Sheet for Healthcare Providers: GravelBags.it  This test is not yet approved or cleared by the [place] FDA and  has been authorized for detection and/or diagnosis of SARS-CoV-2 by  FDA under an Emergency Use Authorization (EUA). This EUA will remain  in effect (meaning this test can be used) for the duration of the  Covid-19 declaration under Section 564(b)(1) of the Act, 21  U.S.C. section 360bbb-3(b)(1), unless the authorization is  terminated or revoked. Performed at [facility], [place] [address] [place], [place] [address]   MRSA PCR Screening     Status: None   Collection Time: 11/18/19  8:56 PM   Specimen: Nasal Mucosa; Nasopharyngeal  Result Value Ref Range Status   MRSA by PCR NEGATIVE NEGATIVE Final    Comment:        The GeneXpert MRSA Assay (FDA approved for NASAL specimens only), is one component of a comprehensive MRSA colonization surveillance program. It is not intended to diagnose MRSA infection nor to guide or monitor treatment for MRSA infections. Performed at [facility], [place] [address] [place], [place] [address]     Procedures and diagnostic studies:  No results found.  Medications:   . bisacodyl  10 mg Rectal Once  . diltiazem  120 mg  Oral Daily  .  feeding supplement  1 Container Oral TID BM  . fluticasone  2 spray Each Nare Daily  . insulin aspart  0-15 Units Subcutaneous TID AC & HS  . insulin aspart  2 Units Subcutaneous TID WC  . insulin glargine  10 Units Subcutaneous Q24H  . irbesartan  75 mg Oral Daily  . pantoprazole  40 mg Oral Daily  . Ensure Max Protein  11 oz Oral BID WC   Continuous Infusions:   LOS: 10 days   Geradine Girt  [facility]   How to contact the [facility] Attending or Consulting provider Center or covering provider during after hours [place], for this patient?  1. Check the care team in [facility] and look for a) attending/consulting TR[facility] provider listed and b) the [facility] team listed 2. Log into [email or web address] and use [place]'s universal password to access. If you do not have the password, please contact the hospital operator. 3. Locate the [facility] provider you are looking for under [facility] and page to a number that you can be directly reached. 4. If you still have difficulty reaching the provider, please page the [facility] (Director on Call) for the Hospitalists listed on amion for assistance.  11/27/2019, 1:24 PM

## 2019-11-27 NOTE — Plan of Care (Signed)

## 2019-11-28 ENCOUNTER — Telehealth: Payer: Self-pay

## 2019-11-28 ENCOUNTER — Other Ambulatory Visit (HOSPITAL_COMMUNITY): Payer: Self-pay | Admitting: Internal Medicine

## 2019-11-28 DIAGNOSIS — K859 Acute pancreatitis without necrosis or infection, unspecified: Secondary | ICD-10-CM

## 2019-11-28 LAB — GLUCOSE, CAPILLARY
Glucose-Capillary: 276 mg/dL — ABNORMAL HIGH (ref 70–99)
Glucose-Capillary: 261 mg/dL — ABNORMAL HIGH (ref 70–99)
Glucose-Capillary: 202 mg/dL — ABNORMAL HIGH (ref 70–99)

## 2019-11-28 MED ORDER — PANTOPRAZOLE SODIUM 40 MG PO TBEC
40.0000 mg | DELAYED_RELEASE_TABLET | Freq: Every day | ORAL | 1 refills | Status: DC
Start: 2019-11-28 — End: 2020-01-20

## 2019-11-28 MED ORDER — INSULIN GLARGINE 100 UNIT/ML ~~LOC~~ SOLN
12.0000 [IU] | SUBCUTANEOUS | Status: DC
  Filled 2019-11-28: qty 0.12

## 2019-11-28 MED ORDER — BASAGLAR KWIKPEN 100 UNIT/ML ~~LOC~~ SOPN: 12.0000 [IU] | PEN_INJECTOR | Freq: Every day | SUBCUTANEOUS | 0 refills | Status: DC

## 2019-11-28 MED ORDER — INSULIN GLARGINE 100 UNIT/ML SOLOSTAR PEN: 12.0000 [IU] | PEN_INJECTOR | Freq: Every day | SUBCUTANEOUS | 0 refills | Status: DC

## 2019-11-28 MED ORDER — ENSURE MAX PROTEIN PO LIQD: 11.0000 [oz_av] | Freq: Two times a day (BID) | ORAL | Status: AC

## 2019-11-28 MED ORDER — INSULIN ASPART 100 UNIT/ML FLEXPEN: 3.0000 [IU] | PEN_INJECTOR | Freq: Three times a day (TID) | SUBCUTANEOUS | 0 refills | Status: DC

## 2019-11-28 MED ORDER — TRAMADOL HCL 50 MG PO TABS
50.0000 mg | ORAL_TABLET | Freq: Four times a day (QID) | ORAL | 0 refills | Status: DC | PRN
Start: 2019-11-28 — End: 2020-01-20

## 2019-11-28 MED FILL — PENTIPS 32G X 4 MM MISC: 32G X 4 MM | 30 days supply | Qty: 100 | Fill #0

## 2019-11-28 MED FILL — traMADol HCL 50 MG TABS: 50 | 3 days supply | Qty: 12 | Fill #0

## 2019-11-28 MED FILL — BASAGLAR 100 UNIT/ML KWIKPE: 100 | 25 days supply | Qty: 3 | Fill #0

## 2019-11-28 MED FILL — NOVOLOG FLEXPEN SYRINGE: 100 | 28 days supply | Qty: 3 | Fill #0

## 2019-11-28 NOTE — Progress Notes (Signed)
All set to go home, discharge instructions given to pt. Awaiting meds  from transition out pt. Pharmacy.

## 2019-11-28 NOTE — Progress Notes (Signed)
Managed to give insulin to her abdomen with good technique

## 2019-11-28 NOTE — Progress Notes (Addendum)
Inpatient Diabetes Program Recommendations  AACE/ADA: New Consensus Statement on Inpatient Glycemic Control (2015)  Target Ranges:  Prepandial:   less than 140 mg/dL      Peak postprandial:   less than 180 mg/dL (1-2 hours)      Critically ill patients:  140 - 180 mg/dL   Lab Results  Component Value Date   GLUCAP 202 (H) 11/28/2019   HGBA1C 11.0 (H) 11/17/2019    Review of Glycemic Control Results for CARLEA, BADOUR (MRN [id number]) as of 11/28/2019 13:18  Ref. Range 11/27/2019 21:37 11/28/2019 00:54 11/28/2019 06:01 11/28/2019 10:37  Glucose-Capillary Latest Ref Range: 70 - 99 mg/dL 188 (H) 276 (H) 261 (H) 202 (H)   Current orders for Inpatient glycemic control:  Lantus 10 units daily, Novolog moderate tid with meals, Novolog 2 units tid with meals  Inpatient Diabetes Program Recommendations:         Note plans for patient to d/c home today. Reviewed with patient again the use of insulin pen.  She was able to practice and inject in "injection cube" including putting on insulin pen needle, priming insulin pen, dialing dose, injection, and holding in place for 6-10 seconds.  Patient demonstrated appropriately.       Per pharmacy, Novolog and Basaglar pens for one month supply will be approximately 200$.  Discussed with patient and she states that she can afford this cost for one month, until she see's her PCP.  Patient has inconsistent appetite at this time, so this may be safer for her.  We did discuss [facility] insulins and costs. I gave her a handout to discuss with her PCP if cost becomes a barrier. Reminded patient to check her blood sugars three times a day before meals and at bedtime.  Also discussed that if she does not eat, she should not take Novolog.  We reviewed hypoglycemia signs, symptoms and treatment as well. Goal blood sugar for her are approximately 100-180 mg/dL and discussed importance of avoidance of hypoglycemia.  She will need to call PCP if blood sugars < 70 mg/dL. She  will need to see her PCP within a week of discharge.   Thanks  Adah Perl, RN, BC-ADM Inpatient Diabetes Coordinator Pager [phone] (8a-5p)

## 2019-11-28 NOTE — Discharge Summary (Signed)
Physician Discharge Summary  [name] OZH:[id number] DOB: November 22, 1940 DOA: 11/17/2019  PCP: Lilian Coma., MD  Admit date: 11/17/2019 Discharge date: 11/28/2019  Admitted From: home Discharge disposition: home   Recommendations for Outpatient Follow-Up:   Plan is for outpatient repeat CT scan and repeat EUS. Adjust insulin as needed: Goal blood sugar for her are approximately 100-180 mg/dL and discussed importance of avoidance of hypoglycemia.  She will need to call PCP if blood sugars < 70 mg/dL   Discharge Diagnosis:   Active Problems:   DKA (diabetic ketoacidosis) (HCC)   Pancreatic mass   Acute pancreatitis   Nausea    Discharge Condition: Improved.  Diet recommendation: Carbohydrate-modified.   Wound care: None.  Code status: Full.   History of Present Illness:   Nancy Hawkins is a 79 y.o. female with medical history significant of HTN and HLD who presents for worsening intermittent abdominal and back pain. She states she has had intermittent abdominal pain and low back pain for about 1 months. She has seen her primary care doctor a couple of times since the pain began. At one point she was diagnosed with diabetes and prescribed Metformin (which she did not tolerate and stopped taking), she was thought to have GERD and given a trial of PPI, which has not helped her symptoms. The pain has remained intermittent, but has progressively worsened to a 10/10 last night, prompting her to visit urgent care leading to a subsequent transfer to the ED. she does not report specific aggravating aggravating or alleviating factors.  Pain at times feels like it is radiating around both sides to her back. she has intermittent associated nausea without vomiting but does not necessarily correlate with her intermittent pain.  She also reports a degree of constipation for the last several days.  She denies vomiting, diarrhea, chest pain, shortness of breath.  She has never had a  colonoscopy.   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 Question pseudocyst versus IPMNs. -lipase and LFTs normal -CA 19-9 elevated at 228 -Gastroenterology consulted and appreciated.  -S/p colonoscopy which showed 5 polyps ranging from 3 to 2 mm, removed with a cold snare. 2 colonic angiectasias, nonbleeding nonthrombosed external and internal hemorrhoids -Status post EGD/EUS: White nummular lesions in the esophageal mucosa, gastritis. Hematin in the entire stomach. One gastric polyp resected and retrieved, clips were placed. Pancreatic body lesion. Cystic lesion seen in the pancreatic head. -GI recommended PPI daily -cytology and surgical pathology appear to be benign -patient able to tolerate soft diet -Plan is for outpatient repeat CT scan and repeat EUS.  Back and abdominal pain -d/c dilaudid - tramadol PRN  Essential hypertension -BP currently stable  Hypokalemia/hypomagnesemia -repleted  Constipation -bowel regimen  Deconditioning -PT consulted and recommended [facility]:   GI   Discharge Exam:   Vitals:   11/28/19 0900 11/28/19 1145  BP: (!) 141/66   Pulse: 69   Resp: 17   Temp: 97.9 F (36.6 C)   SpO2:  99% 99%   Vitals:   11/28/19 0351 11/28/19 0742 11/28/19 0900 11/28/19 1145  BP: (!) 123/53  (!) 141/66   Pulse: 76  69   Resp: 14  17   Temp: 98.6 F (37 C) 98.3 F (36.8 C)  97.9 F (36.6 C)   TempSrc: Oral Oral    SpO2: 98%  99% 99%  Weight:      Height:        General exam: Appears calm and comfortable. Anxious to go home as she is eating now  The results of significant diagnostics from this hospitalization (including imaging, microbiology, ancillary and laboratory) are listed below for reference.     Procedures and Diagnostic Studies:   CT ABDOMEN PELVIS W CONTRAST  Result Date: 11/17/2019 CLINICAL DATA:  Patient with generalized abdominal pain and nausea. EXAM: CT ABDOMEN AND PELVIS WITH CONTRAST TECHNIQUE: Multidetector CT imaging of the abdomen and pelvis was performed using the standard protocol following bolus administration of intravenous contrast. CONTRAST:  167mL OMNIPAQUE IOHEXOL 300 MG/ML  SOLN COMPARISON:  None. FINDINGS: Lower chest: Normal heart size. Lung bases are clear. No pleural effusion. Hepatobiliary: Liver is normal in size and contour. Prior cholecystectomy. No intrahepatic or extrahepatic biliary ductal dilatation. Pancreas: There is suggestion of inflammation involving the mid body of the pancreas with peripancreatic fat stranding. Additionally, within the mid body of the pancreas there is a possible 2.5 x 2.2 cm low-attenuation mass. This appears to abut the celiac axis as well as the splenic vein. The splenic vein is narrowed (image 25; series 3). There is a small amount of fat stranding about the pancreas. Spleen: Unremarkable Adrenals/Urinary Tract: Normal adrenal glands. Kidneys enhance symmetrically with contrast. Bilateral subcentimeter too small to characterize low-attenuation renal lesions. Partially exophytic cyst mid pole left kidney. No hydronephrosis. Urinary bladder is unremarkable. Stomach/Bowel: Descending and sigmoid colonic diverticulosis. No CT evidence for acute diverticulitis. There is focal narrowing of the ascending colon (image 46; series 3). Normal morphology of the stomach. No evidence for small bowel obstruction. No  free fluid or free intraperitoneal air. Vascular/Lymphatic: Normal caliber abdominal aorta. Peripheral calcified atherosclerotic plaque. Retroaortic left renal vein. No retroperitoneal adenopathy. Reproductive: Prior hysterectomy. Other: None. Musculoskeletal: Lower thoracic and lumbar spine degenerative changes. Age-indeterminate compression deformity of the superior L1 endplate and superior L3 endplate. Addition there is age-indeterminate height loss of the T11 vertebral body. IMPRESSION: 1. There is inflammation involving the mid body the pancreas which may be secondary to pancreatitis. Additionally, findings are concerning for the possibility of a mass within the mid body of the pancreas. Given the overall findings, recommend dedicated evaluation of the pancreas with pre and post contrast-enhanced abdominal MRI. 2. There is focal narrowing of the ascending colon. While this may be secondary to peristalsis, recommend correlation with the patient's colon cancer screening history. If screening is not up-to-date, appropriate screening should be considered. 3. Age-indeterminate compression deformities of the superior L1 and L3 vertebral bodies. Recommend correlation with point tenderness. 4. Sigmoid colonic diverticulosis without CT evidence for acute diverticulitis. Electronically Signed   By: Lovey Newcomer M.D.   On: 11/17/2019 17:42   MR ABDOMEN MRCP W WO CONTAST  Result Date: 11/18/2019 CLINICAL DATA:  Pancreatic mass EXAM: MRI ABDOMEN WITHOUT AND WITH CONTRAST (INCLUDING MRCP) TECHNIQUE: Multiplanar multisequence MR imaging of the abdomen was performed both before and after the administration of intravenous contrast. Heavily T2-weighted images of the biliary and pancreatic ducts were obtained, and three-dimensional MRCP images were rendered by post  processing. CONTRAST:  37mL GADAVIST GADOBUTROL 1 MMOL/ML IV SOLN COMPARISON:  CT abdomen pelvis, 11/17/2019 FINDINGS: Lower chest: Trace bilateral pleural  effusions and associated atelectasis or consolidation. Hepatobiliary: No mass or other parenchymal abnormality identified. Status post cholecystectomy. No biliary ductal dilatation. Pancreas: There is extensive inflammatory fat stranding about the pancreas, with a thinly septated multi cystic fluid signal lesion of the anterior pancreatic neck measuring 3.2 x 1.7 cm, without evidence of solid component or contrast enhancement (series 5, image 21). There are numerous additional small fluid signal lesions throughout the pancreatic parenchyma, the remaining lesions subcentimeter. The central portion of the pancreatic duct is dilated, measuring up to 8 mm (series 24, image 51). Spleen:  Within normal limits in size and appearance. Adrenals/Urinary Tract: No masses identified. No evidence of hydronephrosis. Stomach/Bowel: Visualized portions within the abdomen are unremarkable. Vascular/Lymphatic: No pathologically enlarged lymph nodes identified. No abdominal aortic aneurysm demonstrated. The portal vein appears somewhat compressed posterior to the pancreatic head, perhaps due to edema in this vicinity, but remains patent. Other:  Trace perihepatic ascites. Musculoskeletal: No suspicious bone lesions identified. IMPRESSION: 1. There is extensive inflammatory fat stranding about the pancreas, with a thinly septated multi cystic fluid signal lesion of the anterior pancreatic neck measuring 3.2 x 1.7 cm, without evidence of solid component or contrast enhancement. 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 2. There are numerous additional small fluid signal lesions throughout the pancreatic parenchyma, the remaining lesions subcentimeter. These likely reflect additional pseudocysts and or small IPMNs. Consider follow-up MRI at 6-12 months to assess for stability. 3. The central portion of the pancreatic duct is dilated, measuring up to 8 mm, without obvious obstructing lesion identified to the ampulla. 4.  Trace ascites. 5. Trace bilateral pleural effusions and associated atelectasis or consolidation. Electronically Signed   By: Eddie Candle M.D.   On: 11/18/2019 20:16     Labs:   Basic Metabolic Panel: Recent Labs  Lab 11/22/19 0101 11/22/19 0101 11/23/19 0032 11/23/19 0032 11/24/19 0052 11/24/19 0052 11/25/19 0116 11/25/19 0116 11/26/19 0041 11/27/19 0044  NA 134*   < > 132*  --  133*  --  132*  --  136 138  K 4.1   < > 3.5   < > 4.0   < > 3.7   < > 3.5 3.7  CL 103   < > 100  --  101  --  98  --  101 103  CO2 24   < > 22  --  23  --  23  --  25 25  GLUCOSE 186*   < > 144*  --  174*  --  165*  --  258* 212*  BUN 6*   < > 6*  --  5*  --  5*  --  6* 6*  CREATININE 0.38*   < > 0.41*  --  0.47  --  0.37*  --  0.56 0.52  CALCIUM 7.5*   < > 7.6*  --  7.5*  --  7.7*  --  7.9* 8.1*  MG 1.7  --  1.8  --  2.0  --  1.7  --   --  1.7   < > = values in this interval not displayed.   GFR Estimated Creatinine Clearance: 54.9 mL/min (by C-G formula based on SCr of 0.52 mg/dL). Liver Function Tests: Recent Labs  Lab 11/25/19 0116  AST 13*  ALT 18  ALKPHOS 206*  BILITOT 0.9  PROT 5.1*  ALBUMIN 1.5*   No results for input(s): LIPASE, AMYLASE in the last 168 hours. No results for input(s): AMMONIA in the last 168 hours. Coagulation profile No results for input(s): INR, PROTIME in the last 168 hours.  CBC: Recent Labs  Lab 11/23/19 0032 11/24/19 0052 11/25/19 0116 11/26/19 0041 11/27/19 0044  WBC 14.8* 16.8* 17.0* 9.4 8.8  HGB 9.6* 9.7* 9.8* 9.4* 9.6*  HCT 29.7* 29.5* 29.8* 29.3* 29.9*  MCV 85.3 84.3 83.9 85.9 87.2  PLT 248 279 299 299 333   Cardiac Enzymes: No results for input(s): CKTOTAL, CKMB, CKMBINDEX, TROPONINI in the last 168 hours. BNP: Invalid input(s): POCBNP CBG: Recent Labs  Lab 11/27/19 1619 11/27/19 2137 11/28/19 0054 11/28/19 0601 11/28/19 1037  GLUCAP 175* 188* 276* 261* 202*   D-Dimer No results for input(s): DDIMER in the last 72 hours. Hgb  A1c No results for input(s): HGBA1C in the last 72 hours. Lipid Profile No results for input(s): CHOL, HDL, LDLCALC, TRIG, CHOLHDL, LDLDIRECT in the last 72 hours. Thyroid function studies No results for input(s): TSH, T4TOTAL, T3FREE, THYROIDAB in the last 72 hours.  Invalid input(s): FREET3 Anemia work up No results for input(s): VITAMINB12, FOLATE, FERRITIN, TIBC, IRON, RETICCTPCT in the last 72 hours. Microbiology Recent Results (from the past 240 hour(s))  MRSA PCR Screening     Status: None   Collection Time: 11/18/19  8:56 PM   Specimen: Nasal Mucosa; Nasopharyngeal  Result Value Ref Range Status   MRSA by PCR NEGATIVE NEGATIVE Final    Comment:        The GeneXpert MRSA Assay (FDA approved for NASAL specimens only), is one component of a comprehensive MRSA colonization surveillance program. It is not intended to diagnose MRSA infection nor to guide or monitor treatment for MRSA infections. Performed at [facility], [place] [address] [place], [place] [address]      Discharge Instructions:   Discharge Instructions    Diet - low sodium heart healthy   Complete by: As directed    Discharge instructions   Complete by: As directed    outpatient repeat CT scan and repeat EUS   Increase activity slowly   Complete by: As directed      Allergies as of 11/28/2019      Reactions   Cephalosporins Shortness Of Breath   Erythromycin Shortness Of Breath   Penicillins Anaphylaxis      Medication List    TAKE these medications   Basaglar KwikPen 100 UNIT/ML Inject 12 Units into the skin daily.   diltiazem 120 MG 24 hr capsule Commonly known as: TIAZAC Take 120 mg by mouth daily.   Ensure Max Protein Liqd Take 330 mLs (11 oz total) by mouth 2 (two) times daily with a meal.   insulin aspart 100 UNIT/ML FlexPen Commonly known as: NOVOLOG Inject 3 Units into the skin 3 (three) times daily with meals.   pantoprazole 40 MG tablet Commonly known as:  PROTONIX Take 1 tablet (40 mg total) by mouth daily. 40 mg twice daily for 1 month, then 40 mg thereafter   telmisartan 40 MG tablet Commonly known as: MICARDIS Take 40 mg by mouth daily.   traMADol 50 MG tablet Commonly known as: ULTRAM Take 1 tablet (50 mg total) by mouth every 6 (six) hours as needed for moderate pain.       Follow-up Information    Lilian Coma., MD Follow up in 1 week(s).   Specialty: Internal Medicine  Mansouraty, Telford Nab., MD Follow up.   Specialties: Gastroenterology, Internal Medicine Why: office will call with follow up Contact information: [place] [address] [phone]                Time coordinating discharge: 35 min  Signed:  Geradine Girt DO  [facility] 11/28/2019, 5:05 PM

## 2019-11-28 NOTE — Telephone Encounter (Signed)
Will call pt on Friday

## 2019-11-28 NOTE — Progress Notes (Signed)
     Progress Note  Chief Complaint:  pancreatitis     ASSESSMENT / PLAN:    # Acute pancreatitis. EUS with FNA on 10/13 negative for malignancy but we still have concern.  -CT scan on 10/17 showing multiple developing pseudocysts/abscesses which have increased in size since the prior CT. Occlusion of the splenic vein, new small amount of ascites  --Plan is for outpatient repeat CT scan and repeat EUS. Patient wants time to recover instead of pursuing further inpatient workup.  Plan discussed with Hospitalist in the room  # DKA, resolved.       SUBJECTIVE:   Tolerating solids. No abdominal pain at present. Not requiring much Tramadol     OBJECTIVE:    Scheduled inpatient medications:  . bisacodyl  10 mg Rectal Once  . diltiazem  120 mg Oral Daily  . feeding supplement  1 Container Oral TID BM  . fluticasone  2 spray Each Nare Daily  . insulin aspart  0-15 Units Subcutaneous TID AC & HS  . insulin aspart  2 Units Subcutaneous TID WC  . insulin glargine  10 Units Subcutaneous Q24H  . irbesartan  75 mg Oral Daily  . pantoprazole  40 mg Oral Daily  . Ensure Max Protein  11 oz Oral BID WC   Continuous inpatient infusions:  PRN inpatient medications: alum & mag hydroxide-simeth, bisacodyl, dextrose, ondansetron (ZOFRAN) IV, oxyCODONE, traMADol  Vital signs in last 24 hours: Temp:  [97.8 F (36.6 C)-98.6 F (37 C)] 98.3 F (36.8 C) (10/21 0742) Pulse Rate:  [70-77] 76 (10/21 0351) Resp:  [14-16] 14 (10/21 0351) BP: (113-144)/(53-66) 123/53 (10/21 0351) SpO2:  [98 %] 98 % (10/21 0351) Last BM Date: 11/27/19  Intake/Output Summary (Last 24 hours) at 11/28/2019 0916 Last data filed at 11/28/2019 0800 Gross per 24 hour  Intake 170 ml  Output --  Net 170 ml     Physical Exam:  . General: Alert, female in NAD . Heart:  Regular rate and rhythm.. No lower extremity edema . Pulmonary: Normal respiratory effort . Abdomen: Soft, nondistended, Nontender. Normal bowel  sounds. No masses felt. . Neurologic: Alert and oriented . Psych: Pleasant. Cooperative.   Filed Weights   11/18/19 0025 11/18/19 2047 11/23/19 0342  Weight: 63 kg 66 kg 70.5 kg    Intake/Output from previous day: 10/20 0701 - 10/21 0700 In: 50 [IV Piggyback:50] Out: -  Intake/Output this shift: Total I/O In: 120 [P.O.:120] Out: -     Lab Results: Recent Labs    11/26/19 0041 11/27/19 0044  WBC 9.4 8.8  HGB 9.4* 9.6*  HCT 29.3* 29.9*  PLT 299 333   BMET Recent Labs    11/26/19 0041 11/27/19 0044  NA 136 138  K 3.5 3.7  CL 101 103  CO2 25 25  GLUCOSE 258* 212*  BUN 6* 6*  CREATININE 0.56 0.52  CALCIUM 7.9* 8.1*   LFT No results for input(s): PROT, ALBUMIN, AST, ALT, ALKPHOS, BILITOT, BILIDIR, IBILI in the last 72 hours. PT/INR No results for input(s): LABPROT, INR in the last 72 hours. Hepatitis Panel No results for input(s): HEPBSAG, HCVAB, HEPAIGM, HEPBIGM in the last 72 hours.  No results found.    Active Problems:   DKA (diabetic ketoacidosis) ([place])   Pancreatic mass   Acute pancreatitis   Nausea     LOS: 11 days   Tye Savoy ,NP 11/28/2019, 9:16 AM

## 2019-11-28 NOTE — Discharge Instructions (Signed)
Check your blood sugar 4 times a day (before each meal and at bedtime) Remember that the Novolog is rapid acting insulin and should be given meals- If you do not eat, then do not take Novolog Basaglar is long acting insulin and lasts 24 hours- take at bedtime

## 2019-11-28 NOTE — Telephone Encounter (Signed)
-----   Message from Irving Copas., MD sent at 11/27/2019  9:54 PM EDT ----- Regarding: Follow-up Nancy Hawkins,This patient needs a repeat EUS attempt and she wants to be discharged from the hospital before having it done again.She understands the risks of the potentially underlying malignancy being missed but wants time to recover.Please schedule this patient for a pancreas protocol CT abdomen 1 week before EUS.I see have a Friday, November 12 purple block morning that she could potentially have done versus one of my hospital days at the end of November during my hospital week.Please offer her either of these (may be best to reach out to her on Friday when hopefully she has been discharged).Although Dr. Hilarie Fredrickson will be her primary, you may set up the CT scan under my name since I will be pursuing next EUS.Please let us know what date she chooses.Thanks to all.GM

## 2019-11-28 NOTE — TOC Transition Note (Signed)
Transition of Care [facility]) - CM/SW Discharge Note   Patient Details  Name: Nancy Hawkins MRN: [id number] Date of Birth: [birth year]  Transition of Care [facility] CM/SW Contact:  Zenon Mayo, RN Phone Number: 11/28/2019, 12:25 PM   Clinical Narrative:    Patient is for dc today, NCM offered choice for HHPT, she is refusing stating she is very active, she does treadmill , plays golf etc.  At this time she states she does not want HHPT.  She has no other needs.    Final next level of care: Home/Self Care Barriers to Discharge: No Barriers Identified   Patient Goals and CMS Choice Patient states their goals for this hospitalization and ongoing recovery are:: get better CMS Medicare.gov Compare Post Acute Care list provided to:: Patient Choice offered to / list presented to : Patient  Discharge Placement                       Discharge Plan and Services   Discharge Planning Services: CM Consult              DME Agency: NA       HH Arranged: PT, Refused HH          Social Determinants of Health (SDOH) Interventions     Readmission Risk Interventions No flowsheet data found.

## 2019-11-29 ENCOUNTER — Other Ambulatory Visit: Payer: Self-pay

## 2019-11-29 DIAGNOSIS — K8689 Other specified diseases of pancreas: Secondary | ICD-10-CM

## 2019-11-29 NOTE — Telephone Encounter (Signed)
Needs CT scan as well 1 week prior to the appt

## 2019-11-29 NOTE — Telephone Encounter (Signed)
EUS scheduled on 01/20/20 at 12 noon [facility] with Dr Rush Landmark  COVID test on 01/16/20 at 10 am   Left message on machine to call back

## 2019-12-02 NOTE — Telephone Encounter (Signed)
  The pt has been scheduled for 12/13 for EUS per pt preference.     You are scheduled at [facility] on 01/10/20 at 10 am for a CT scan..  You will need to pick up contrast at least 2 days prior from [facility] radiology.    The pt has been advised and instructed.  All information has been sent to the pt in My Chart per pt preference.

## 2019-12-02 NOTE — Telephone Encounter (Signed)
[name], Thank you for the update. GM

## 2020-01-07 ENCOUNTER — Telehealth: Payer: Self-pay | Admitting: Gastroenterology

## 2020-01-07 NOTE — Telephone Encounter (Signed)
Dr. Rush Landmark, please advise if anti-nausea meds can be prescribed for pt prior to drinking contrast for CT scan.

## 2020-01-07 NOTE — Telephone Encounter (Signed)
OK for Zofran 8 mg x 1 dose ODT or Pill.   Or OK for Reglan 5 mg x 1 dose Pill.  Thanks. GM

## 2020-01-08 MED ORDER — ONDANSETRON 8 MG PO TBDP: ORAL_TABLET | ORAL | 0 refills | Status: DC

## 2020-01-08 NOTE — Telephone Encounter (Signed)
Speak with pt this afternoon and she would like for Zofran ODT to be sent to her pharmacy. Pt has been instructed to take Zofran 37mins prior to drinking contrast for CT scan. Pt voiced understanding.

## 2020-01-10 ENCOUNTER — Telehealth: Payer: Self-pay | Admitting: Gastroenterology

## 2020-01-10 ENCOUNTER — Ambulatory Visit (HOSPITAL_COMMUNITY)
Admission: RE | Admit: 2020-01-10 | Discharge: 2020-01-10 | Disposition: A | Discharge status: Home / Self Care | Payer: Medicare Other | Source: Ambulatory Visit | Attending: Gastroenterology | Admitting: Gastroenterology

## 2020-01-10 ENCOUNTER — Other Ambulatory Visit: Payer: Self-pay

## 2020-01-10 DIAGNOSIS — K859 Acute pancreatitis without necrosis or infection, unspecified: Secondary | ICD-10-CM | POA: Diagnosis not present

## 2020-01-10 LAB — POCT I-STAT CREATININE: Creatinine, Ser: 0.5 mg/dL (ref 0.44–1.00)

## 2020-01-10 IMAGING — CT CT ABDOMEN W/ CM
3 of 5 series · 14 of 46 positions shown, 16 images · IV contrast (omnipaque)
Comparison: CT examination from [DATE]

CLINICAL DATA: Persistent pancreatitis

EXAM:
CT ABDOMEN WITH CONTRAST
TECHNIQUE: Multidetector CT imaging of the abdomen was performed using the
standard protocol following bolus administration of intravenous
contrast.
CONTRAST:  100mL OMNIPAQUE IOHEXOL 300 MG/ML  SOLN

[Series 3: axial st · axial · 0.82mm/px · z∈[+1200,+1430]mm · 10 of 58 slices shown, 12 images]
[im 6/58  soft-tissue]
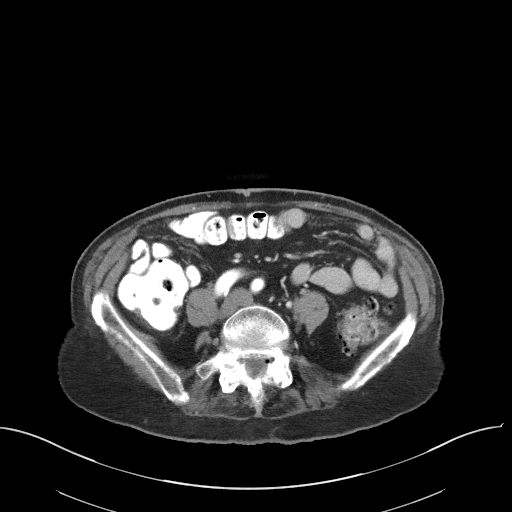
[im 6/58  bone]
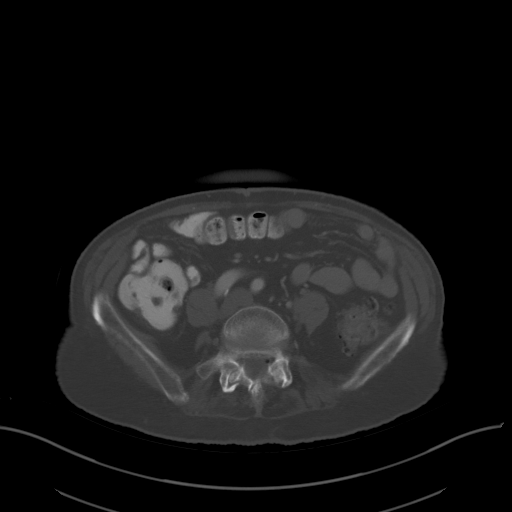
[im 11/58  soft-tissue]
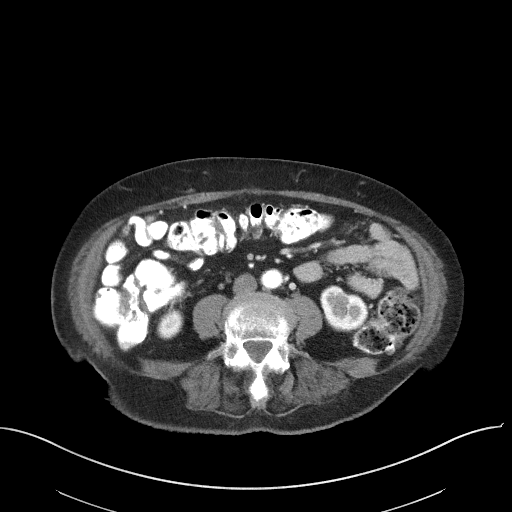
[im 16/58  soft-tissue]
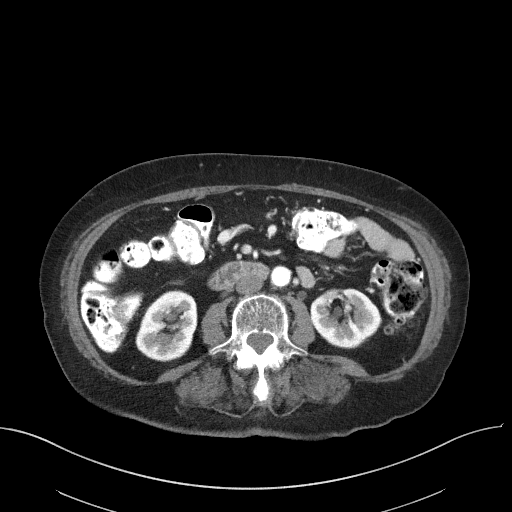
[im 21/58  soft-tissue]
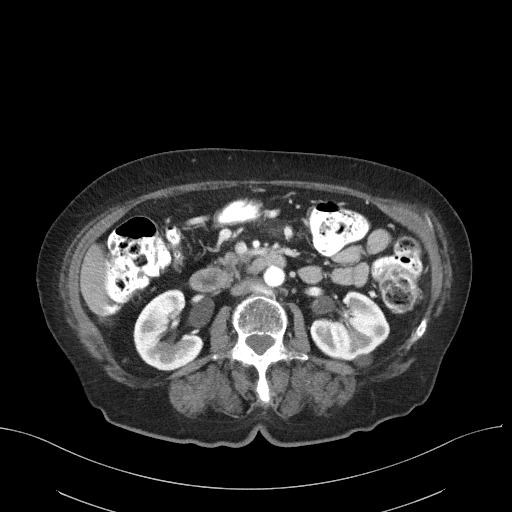
[im 26/58  soft-tissue]
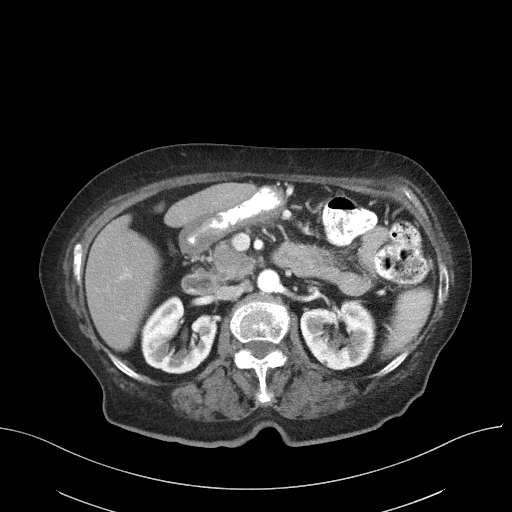
[im 32/58  soft-tissue]
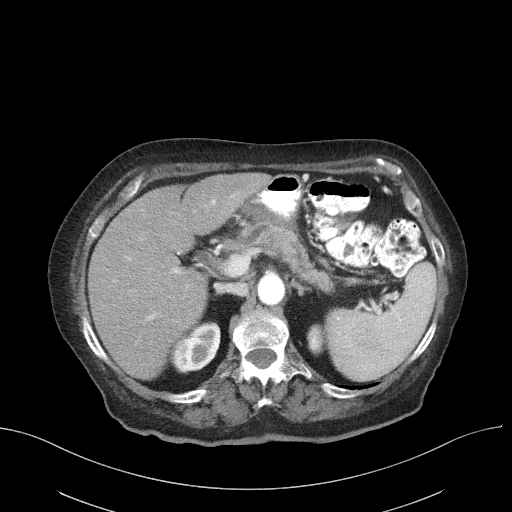
[im 37/58  soft-tissue]
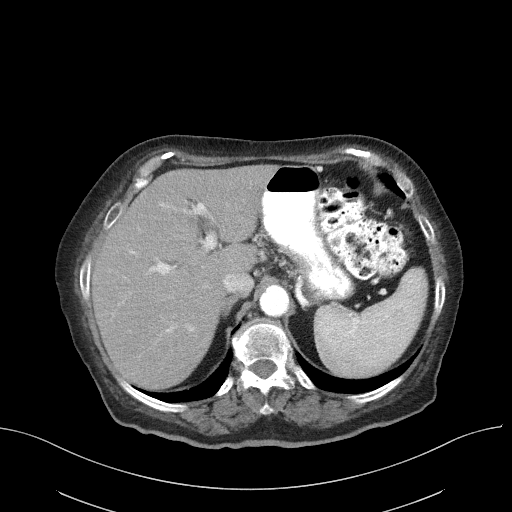
[im 42/58  soft-tissue]
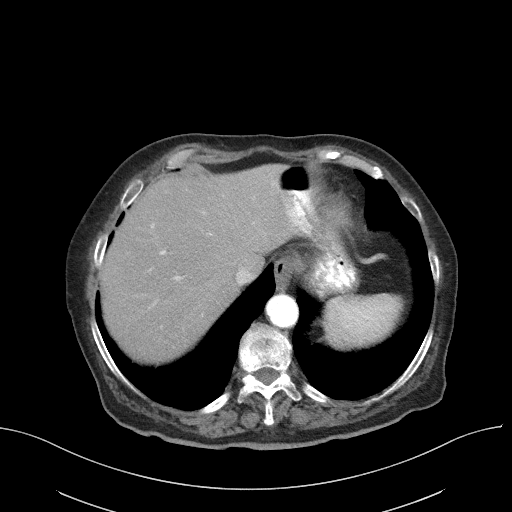
[im 47/58  soft-tissue]
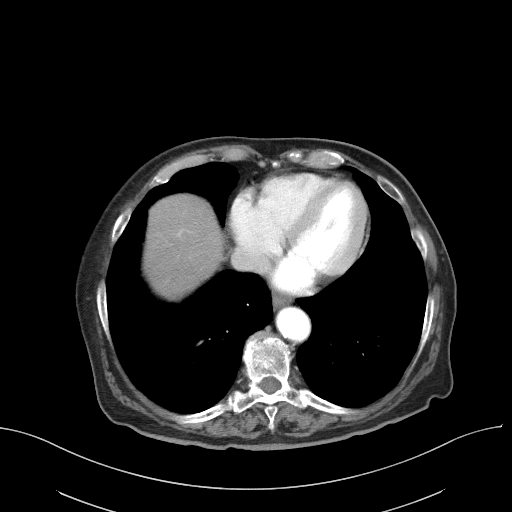
[im 47/58  bone]
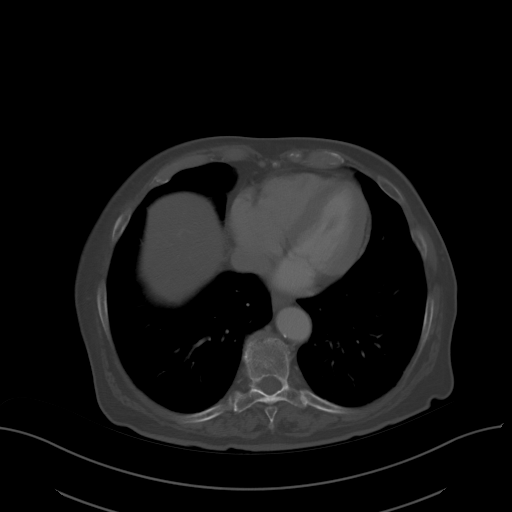
[im 52/58  soft-tissue]
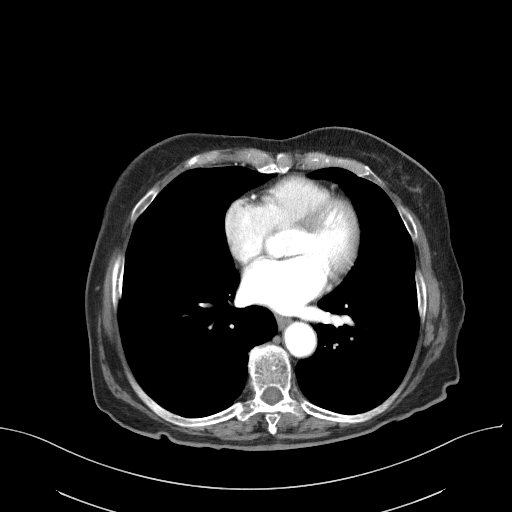

[Series 5: coronal st · coronal · 0.59mm/px · 3 of 89 slices shown]
[im 30/89  soft-tissue]
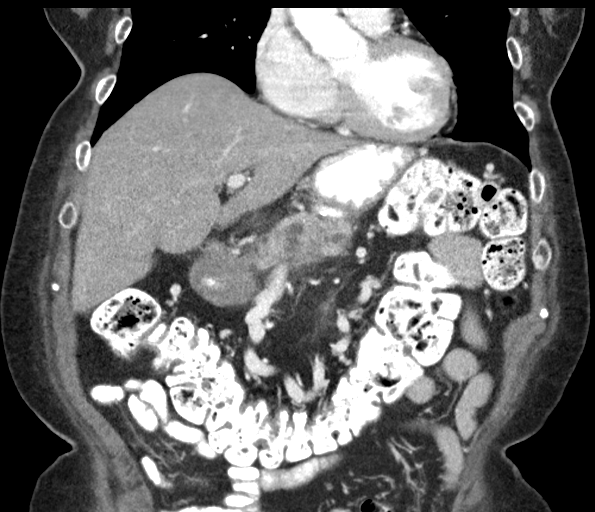
[im 40/89  soft-tissue]
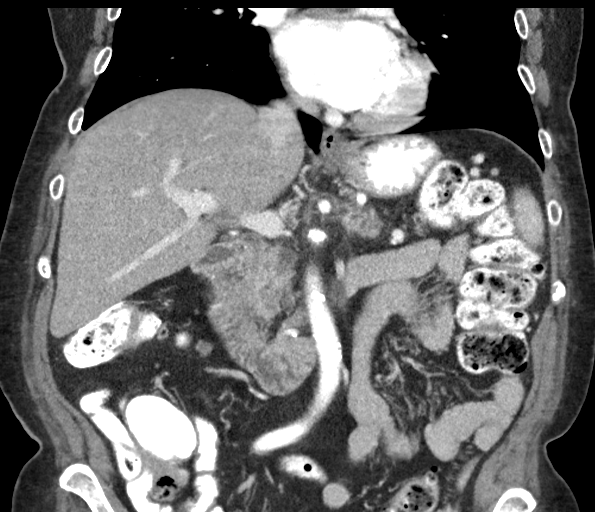
[im 49/89  soft-tissue]
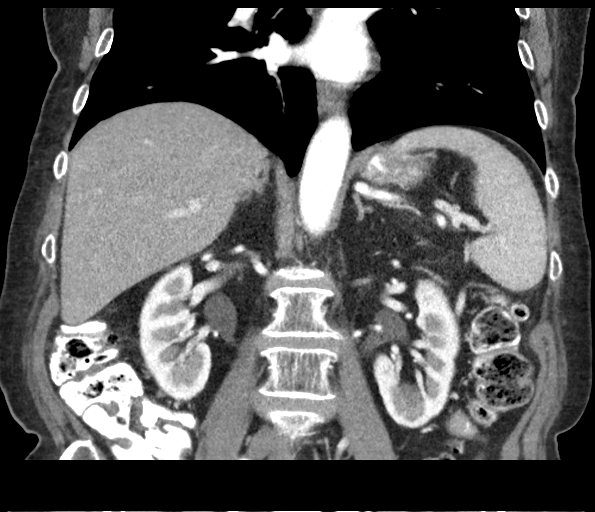

[Series 7: lung bases · axial · 0.80mm/px · 1 of 68 slices shown]
[im 6/68  bone]
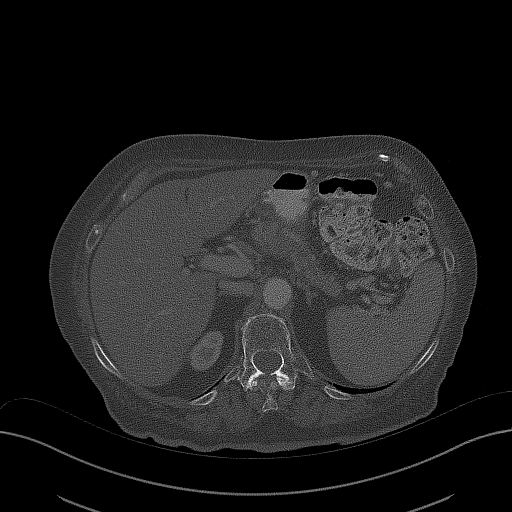

[14 of 46 positions shown; findings below may reference images not displayed]

FINDINGS: Lower chest: Lung bases are clear. No effusion. No consolidation.
Calcification of LEFT and RIGHT coronary circulation.

Hepatobiliary: Hepatic steatosis. Portal vein is patent. No focal
suspicious hepatic lesion.

Pancreas: Ductal dilation of the pancreas with near complete
resolution of fluid, peripancreatic necrosis and improvement with
respect to stranding in the lesser sac with only minimal stranding
seen adjacent to the pancreatic body, proximal body on the current
study. Infiltration of the fat around the splenic artery.

Main pancreatic duct dilated to 6 mm, more dilated than on the prior
exam in the neck of the pancreas. This terminates in an area of
decreased attenuation measuring 2.3 x 1.5 cm in the pancreatic head
extending in the uncinate process. This area abuts and distorts the
portal vein. Distortion of the portal vein was present on the prior
study but there was obvious acute pancreatitis on the previous exam.

Some peripancreatic stranding seen on today's study. This is much
less intense than on the prior exam. There are portosystemic
collaterals in the upper abdomen with engorgement of the gastrocolic
trunk and gastroepiploic veins. Imaging findings in the head of the
pancreas represent a change from the previous exam.

Spleen: Normal

Adrenals/Urinary Tract: Adrenal glands are normal.

Symmetric renal enhancement. No sign of hydronephrosis. Small cysts
in the LEFT kidney.

Stomach/Bowel: Abutting the posterior stomach there is soft tissue
stranding that is inseparable from the pancreatic bed. There was
fluid attenuation on the prior exam in this location and extensive
stranding, this is markedly improved.

No acute gastrointestinal process. Signs of colonic diverticulosis.
Pelvic bowel loops are not imaged.

Vascular/Lymphatic: Narrowing of the main portal vein. Splenic
venous occlusion, chronic. Portosystemic collaterals in the upper
abdomen. Retroaortic LEFT renal vein. There is no gastrohepatic or
hepatoduodenal ligament lymphadenopathy. No retroperitoneal or
mesenteric lymphadenopathy.

Other: No ascites.  No free air.

Musculoskeletal: Spinal degenerative changes. Signs of compression
fractures in the lumbar and lower thoracic spine with no change in
appearance compared to previous imaging.
IMPRESSION: 1. Focal area of hypoattenuation in the pancreatic head with subtle
change in the contour of the pancreatic head associated with abrupt
ductal cut off. Findings are suspicious for pancreatic neoplasm with
abrupt ductal obstruction, perhaps leading to prior pancreatitis.
Endoscopic ultrasound assessment of this area is suggested for
further evaluation.
2. Improving signs of pancreatitis and peripancreatic necrosis with
only a small amount of fluid adjacent to the dilated duct measuring
10 x 7 mm. Stranding in the area is also diminished when compared to
previous imaging.
3. Stable signs of portal venous narrowing at the neck of the
pancreas, finding could be seen in both neoplasm or sequela of
pancreatitis but are suspicious given adjacent findings of potential
isodense or nearly isodense pancreatic lesion with ductal
obstruction. Finding is associated with portosystemic collaterals in
the upper abdomen.
4. Hepatic steatosis.
5. Aortic atherosclerosis.

These results will be called to the ordering clinician or
representative by the Radiologist Assistant, and communication
documented in the PACS or [REDACTED].

Aortic Atherosclerosis ([TW]-[TW]).

## 2020-01-10 MED ORDER — IOHEXOL 300 MG/ML  SOLN
100.0000 mL | Freq: Once | INTRAMUSCULAR | Status: AC | PRN
  Administered 2020-01-10: 100 mL via INTRAVENOUS

## 2020-01-10 NOTE — Telephone Encounter (Signed)
Nancy Hawkins from [facility] is calling to provide a call report on a CT Abdomen

## 2020-01-13 ENCOUNTER — Telehealth: Payer: Self-pay

## 2020-01-13 NOTE — Telephone Encounter (Signed)
Received call report from [facility], see below:  IMPRESSION: 1. Focal area of hypoattenuation in the pancreatic head with subtle change in the contour of the pancreatic head associated with abrupt ductal cut off. 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 Endoscopic ultrasound assessment of this area is suggested for further evaluation. 2. Improving signs of pancreatitis and peripancreatic necrosis with only a small amount of fluid adjacent to the dilated duct measuring 10 x 7 mm. Stranding in the area is also diminished when compared to previous imaging. 3. Stable signs of portal venous narrowing at the neck of the pancreas, finding could be seen in both neoplasm or sequela of pancreatitis but are suspicious given adjacent findings of potential isodense or nearly isodense pancreatic lesion with ductal obstruction. Finding is associated with portosystemic collaterals in the upper abdomen. 4. Hepatic steatosis. 5. Aortic atherosclerosis.

## 2020-01-13 NOTE — Telephone Encounter (Signed)
Spoke with pt and she is aware from result note.

## 2020-01-13 NOTE — Telephone Encounter (Signed)
I sent a result note to [name] this weekend about the result and need to call patient.  Please check her inbox of messages. Thank you. GM

## 2020-01-13 NOTE — Telephone Encounter (Signed)
See additional phone note. 

## 2020-01-16 ENCOUNTER — Other Ambulatory Visit (HOSPITAL_COMMUNITY)
Admission: RE | Admit: 2020-01-16 | Discharge: 2020-01-16 | Disposition: A | Discharge status: Home / Self Care | Payer: Medicare Other | Source: Ambulatory Visit | Attending: Gastroenterology | Admitting: Gastroenterology

## 2020-01-16 DIAGNOSIS — Z20822 Contact with and (suspected) exposure to covid-19: Secondary | ICD-10-CM | POA: Insufficient documentation

## 2020-01-16 DIAGNOSIS — Z01812 Encounter for preprocedural laboratory examination: Secondary | ICD-10-CM | POA: Insufficient documentation

## 2020-01-16 LAB — SARS CORONAVIRUS 2 (TAT 6-24 HRS): SARS Coronavirus 2: NEGATIVE

## 2020-01-17 ENCOUNTER — Encounter (HOSPITAL_COMMUNITY): Payer: Self-pay | Admitting: Gastroenterology

## 2020-01-17 ENCOUNTER — Other Ambulatory Visit: Payer: Self-pay

## 2020-01-17 NOTE — Anesthesia Preprocedure Evaluation (Addendum)
Anesthesia Evaluation  Patient identified by MRN, date of birth, ID band Patient awake    Reviewed: Allergy & Precautions, NPO status , Patient's Chart, lab work & pertinent test results  Airway Mallampati: II  TM Distance: >3 FB Neck ROM: Full    Dental  (+) Teeth Intact, Dental Advisory Given   Pulmonary    breath sounds clear to auscultation       Cardiovascular hypertension,  Rhythm:Regular Rate:Normal     Neuro/Psych    GI/Hepatic   Endo/Other  diabetes  Renal/GU      Musculoskeletal   Abdominal   Peds  Hematology   Anesthesia Other Findings   Reproductive/Obstetrics                            Anesthesia Physical Anesthesia Plan  ASA: III  Anesthesia Plan: MAC   Post-op Pain Management:    Induction: Intravenous  PONV Risk Score and Plan: Ondansetron  Airway Management Planned: Natural Airway and Nasal Cannula  Additional Equipment:   Intra-op Plan:   Post-operative Plan:   Informed Consent: I have reviewed the patients History and Physical, chart, labs and discussed the procedure including the risks, benefits and alternatives for the proposed anesthesia with the patient or authorized representative who has indicated his/her understanding and acceptance.     Dental advisory given  Plan Discussed with: CRNA and Anesthesiologist  Anesthesia Plan Comments: (PAT note by Karoline Caldwell, PA-C: History of paroxysmal afib, not on anticoagulation due to maintenance of sinus rhythm. Last seen by [facility]ologist at [facility] 11/01/18. Per note, "1. Paroxysmal atrial fibrillation, remotely noted, has had no symptomatic recurrence and had been maintained with rhythm control with propafenone for quite some time. She is presently on diltiazem alone, and has had no symptomatic recurrence of atrial fibrillation. Today she is in sinus rhythm with APCs. Will continue same. Have asked her to  call if she does experience any recurrence symptoms, in which case we may need to repeat a monitor to assure that she is not having PAF. If so would need to consider initiation of anticoagulation in light of her age/chads Vasc score 2. Hypertension, well controlled 3. Preserved LV systolic function with mild MR and TR on echo 2012 4. No evidence of ischemic heart disease on Cardiolite study 2008 5. Gastroesophageal reflux disease.  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  Hx of anemia, review of recent labs shows baseline Hgb ~9.5.  Will need DOS labs and eval.   EKG 11/18/19: Sinus rhythm. Rate 82. Low voltage, precordial leads. Anteroseptal infarct, old. Prolonged QT interval (QTc 547). )       Anesthesia Quick Evaluation

## 2020-01-17 NOTE — Progress Notes (Signed)
Patient denies shortness of breath, fever, cough or chest pain.  PCP - Dr Tiana Loft Cardiologist - n/a,  (Had one in [place], [place] - Dr. Durel Salts)  Chest x-ray - n/a EKG - 11/18/19 Stress Test - 2008 CE (Normal) ECHO - 2008 CE, 07/24/14 [facility] Cardiac Cath - n/a  Fasting Blood Sugar - 150-175s  Checks Blood Sugar 4 times a day   . THE MORNING OF SURGERY, If your CBG is greater than 220 mg/dL, you may take  of your sliding scale (correction) dose of Novolog insulin. . The night before surgery take 6 units I  . If your blood sugar is less than 70 mg/dL, you will need to treat for low blood sugar: o Treat a low blood sugar (less than 70 mg/dL) with  cup of clear juice (cranberry or apple), 4 glucose tablets, OR glucose gel. o Recheck blood sugar in 15 minutes after treatment (to make sure it is greater than 70 mg/dL). If your blood sugar is not greater than 70 mg/dL on recheck, call [phone] for further instructions.  Aspirin Instructions: Follow your surgeon's instructions on when to stop aspirin prior to surgery,  If no instructions were given by your surgeon then you will need to call the office for those instructions.  Anesthesia review: Yes  STOP now taking any Aspirin (unless otherwise instructed by your surgeon), Aleve, Naproxen, Ibuprofen, Motrin, Advil, Goody's, BC's, all herbal medications, fish oil, and all vitamins.   Coronavirus Screening Covid test on 01/16/20 was negative.  Patient verbalized understanding of instructions that were given via phone.

## 2020-01-17 NOTE — Progress Notes (Signed)
Anesthesia Chart Review: Same day workup  History of paroxysmal afib, not on anticoagulation due to maintenance of sinus rhythm. Last seen by cardiologist at [facility] 11/01/18. Per note, "1. Paroxysmal atrial fibrillation, remotely noted, has had no symptomatic recurrence and had been maintained with rhythm control with propafenone for quite some time. She is presently on diltiazem alone, and has had no symptomatic recurrence of atrial fibrillation. Today she is in sinus rhythm with APCs. Will continue same. Have asked her to call if she does experience any recurrence symptoms, in which case we may need to repeat a monitor to assure that she is not having PAF. If so would need to consider initiation of anticoagulation in light of her age/chads Vasc score 2. Hypertension, well controlled 3. Preserved LV systolic function with mild MR and TR on echo 2012 4. No evidence of ischemic heart disease on Cardiolite study 2008 5. Gastroesophageal reflux disease.  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  Hx of anemia, review of recent labs shows baseline Hgb ~9.5.  Will need DOS labs and eval.   EKG 11/18/19: Sinus rhythm. Rate 82. Low voltage, precordial leads. Anteroseptal infarct, old. Prolonged QT interval (QTc 547).   Wynonia Musty [facility]/Anesthesiology Phone [phone] 01/17/2020 12:27 PM

## 2020-01-20 ENCOUNTER — Ambulatory Visit (HOSPITAL_COMMUNITY)
Admission: RE | Admit: 2020-01-20 | Discharge: 2020-01-20 | Disposition: A | Discharge status: Home / Self Care | Payer: Medicare Other | Attending: Gastroenterology | Admitting: Gastroenterology

## 2020-01-20 ENCOUNTER — Encounter (HOSPITAL_COMMUNITY)
Admission: RE | Disposition: A | Discharge status: Home / Self Care | Payer: Self-pay | Source: Home / Self Care | Attending: Gastroenterology

## 2020-01-20 ENCOUNTER — Other Ambulatory Visit: Payer: Self-pay

## 2020-01-20 ENCOUNTER — Encounter (HOSPITAL_COMMUNITY): Payer: Self-pay | Admitting: Gastroenterology

## 2020-01-20 ENCOUNTER — Telehealth: Payer: Self-pay | Admitting: Gastroenterology

## 2020-01-20 ENCOUNTER — Ambulatory Visit (HOSPITAL_COMMUNITY): Payer: Medicare Other | Admitting: Physician Assistant

## 2020-01-20 ENCOUNTER — Ambulatory Visit (HOSPITAL_COMMUNITY): Payer: Medicare Other

## 2020-01-20 DIAGNOSIS — Z880 Allergy status to penicillin: Secondary | ICD-10-CM | POA: Insufficient documentation

## 2020-01-20 DIAGNOSIS — Z87892 Personal history of anaphylaxis: Secondary | ICD-10-CM | POA: Insufficient documentation

## 2020-01-20 DIAGNOSIS — K449 Diaphragmatic hernia without obstruction or gangrene: Secondary | ICD-10-CM | POA: Diagnosis not present

## 2020-01-20 DIAGNOSIS — E119 Type 2 diabetes mellitus without complications: Secondary | ICD-10-CM | POA: Diagnosis not present

## 2020-01-20 DIAGNOSIS — Z8719 Personal history of other diseases of the digestive system: Secondary | ICD-10-CM | POA: Diagnosis not present

## 2020-01-20 DIAGNOSIS — C250 Malignant neoplasm of head of pancreas: Secondary | ICD-10-CM | POA: Insufficient documentation

## 2020-01-20 DIAGNOSIS — Z881 Allergy status to other antibiotic agents status: Secondary | ICD-10-CM | POA: Diagnosis not present

## 2020-01-20 DIAGNOSIS — K869 Disease of pancreas, unspecified: Secondary | ICD-10-CM | POA: Diagnosis present

## 2020-01-20 DIAGNOSIS — K631 Perforation of intestine (nontraumatic): Secondary | ICD-10-CM

## 2020-01-20 DIAGNOSIS — K668 Other specified disorders of peritoneum: Secondary | ICD-10-CM

## 2020-01-20 DIAGNOSIS — K8689 Other specified diseases of pancreas: Secondary | ICD-10-CM | POA: Diagnosis not present

## 2020-01-20 HISTORY — DX: Acute pancreatitis without necrosis or infection, unspecified: K85.90

## 2020-01-20 HISTORY — PX: EUS: SHX5427

## 2020-01-20 HISTORY — DX: Cardiac arrhythmia, unspecified: I49.9

## 2020-01-20 HISTORY — PX: FINE NEEDLE ASPIRATION BIOPSY: CATH118315

## 2020-01-20 HISTORY — DX: Other specified diseases of pancreas: K86.89

## 2020-01-20 HISTORY — DX: Other seasonal allergic rhinitis: J30.2

## 2020-01-20 HISTORY — PX: ESOPHAGOGASTRODUODENOSCOPY (EGD) WITH PROPOFOL: SHX5813

## 2020-01-20 LAB — GLUCOSE, CAPILLARY: Glucose-Capillary: 167 mg/dL — ABNORMAL HIGH (ref 70–99)

## 2020-01-20 IMAGING — DX DG ABD PORTABLE 2V
1 series · 2 of 2 positions shown · non-contrast
Comparison: [DATE] normal bowel gas

CLINICAL DATA: Post EGD.  Rule out free air

EXAM:
PORTABLE ABDOMEN - 2 VIEW

[Series 1: abdomen · 0.14mm/px · 2 of 2 slices shown]
[im 1/2]
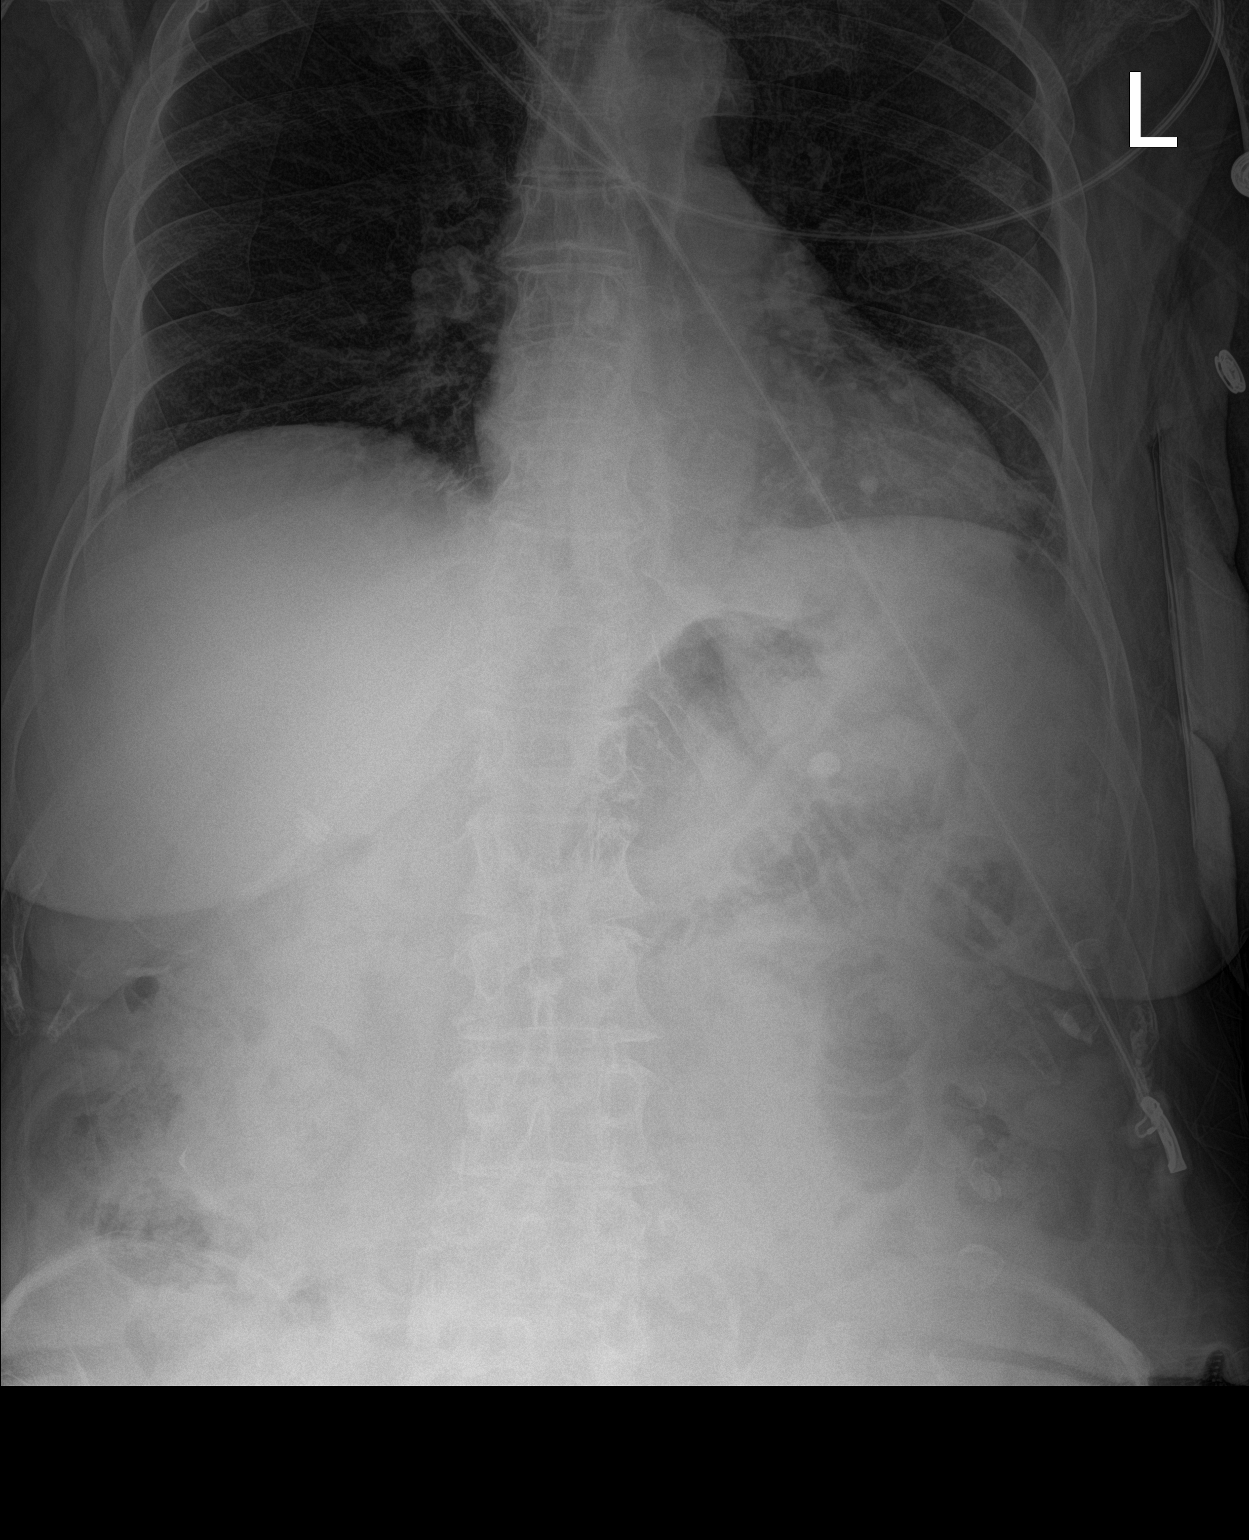
[im 2/2]
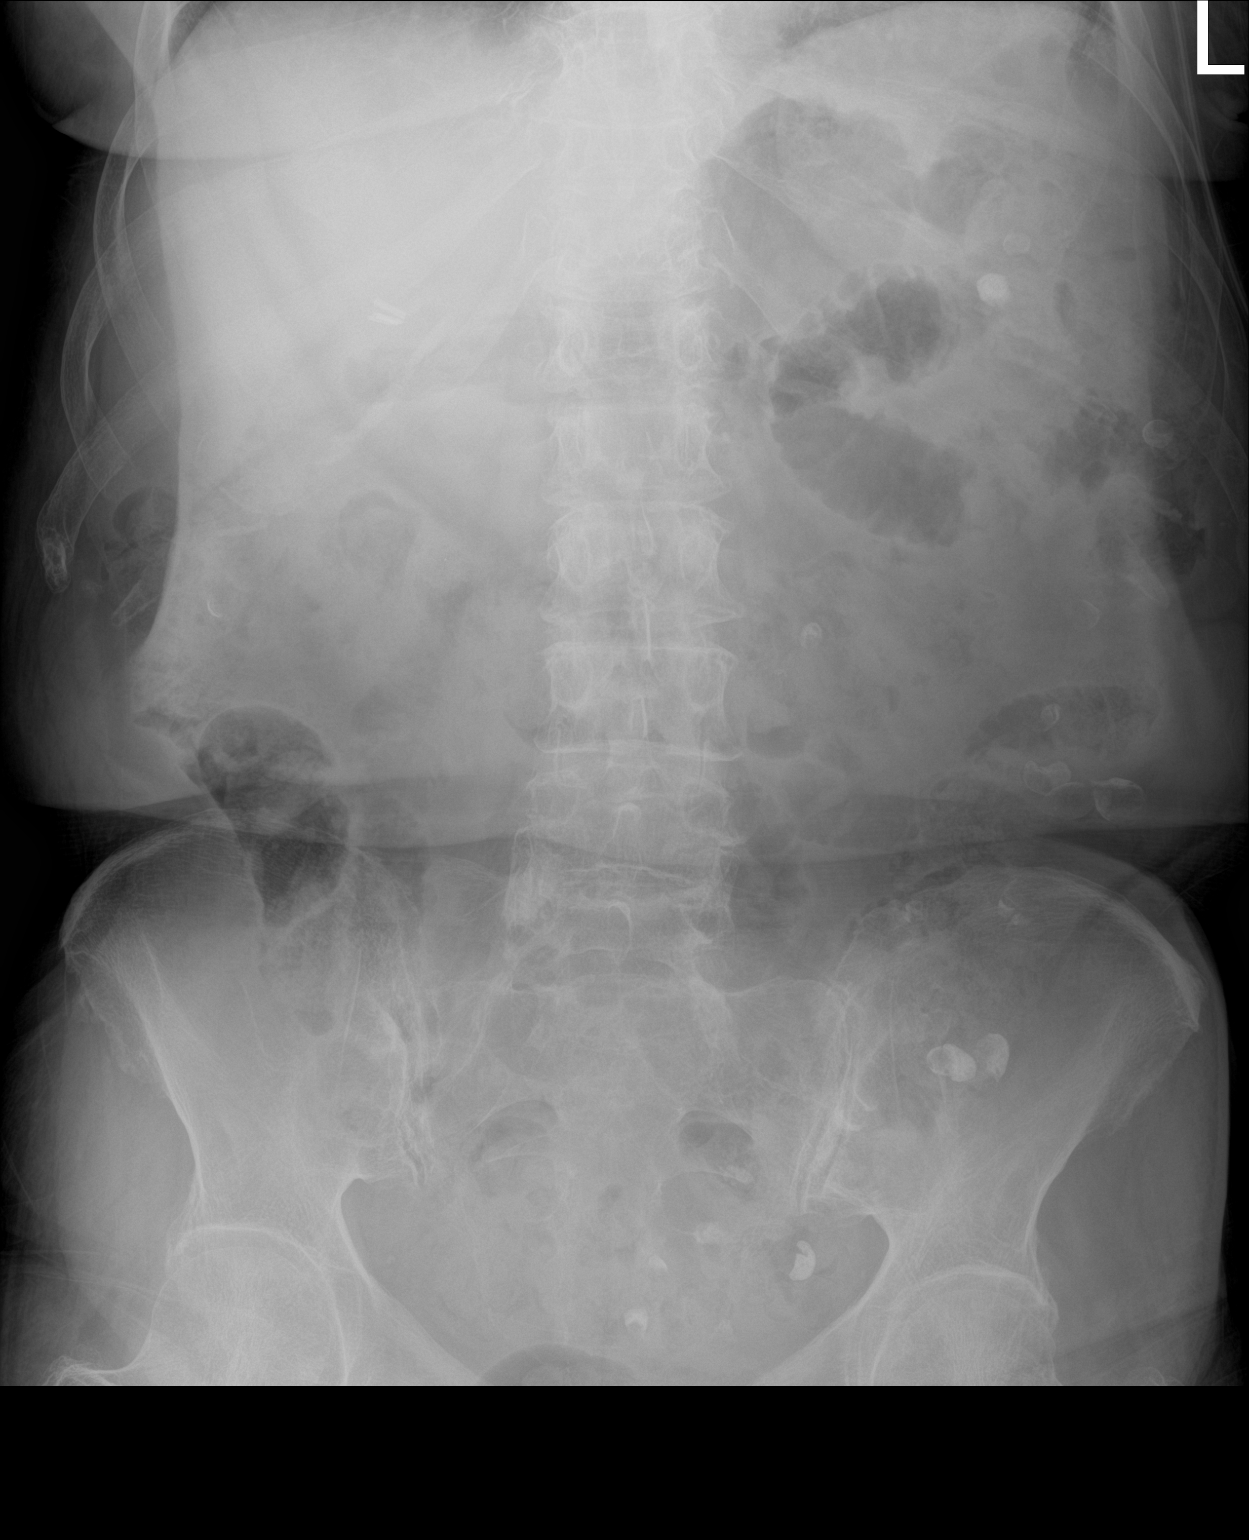

[2 of 2 positions shown; findings below may reference images not displayed]

FINDINGS: Normal bowel gas pattern without dilated bowel loops. No free air on
this upright view.

Barium in tics in the left colon and sigmoid colon. Surgical clips
right upper quadrant.
IMPRESSION: Normal bowel gas pattern.  No free air on this upright view.

## 2020-01-20 IMAGING — DX DG ABDOMEN DECUB ONLY 1V
1 series · 1 of 1 positions shown · non-contrast
Comparison: [DATE]

CLINICAL DATA: Post EGD.  Rule out free air.

EXAM:
ABDOMEN - 1 VIEW DECUBITUS

[abdomen]
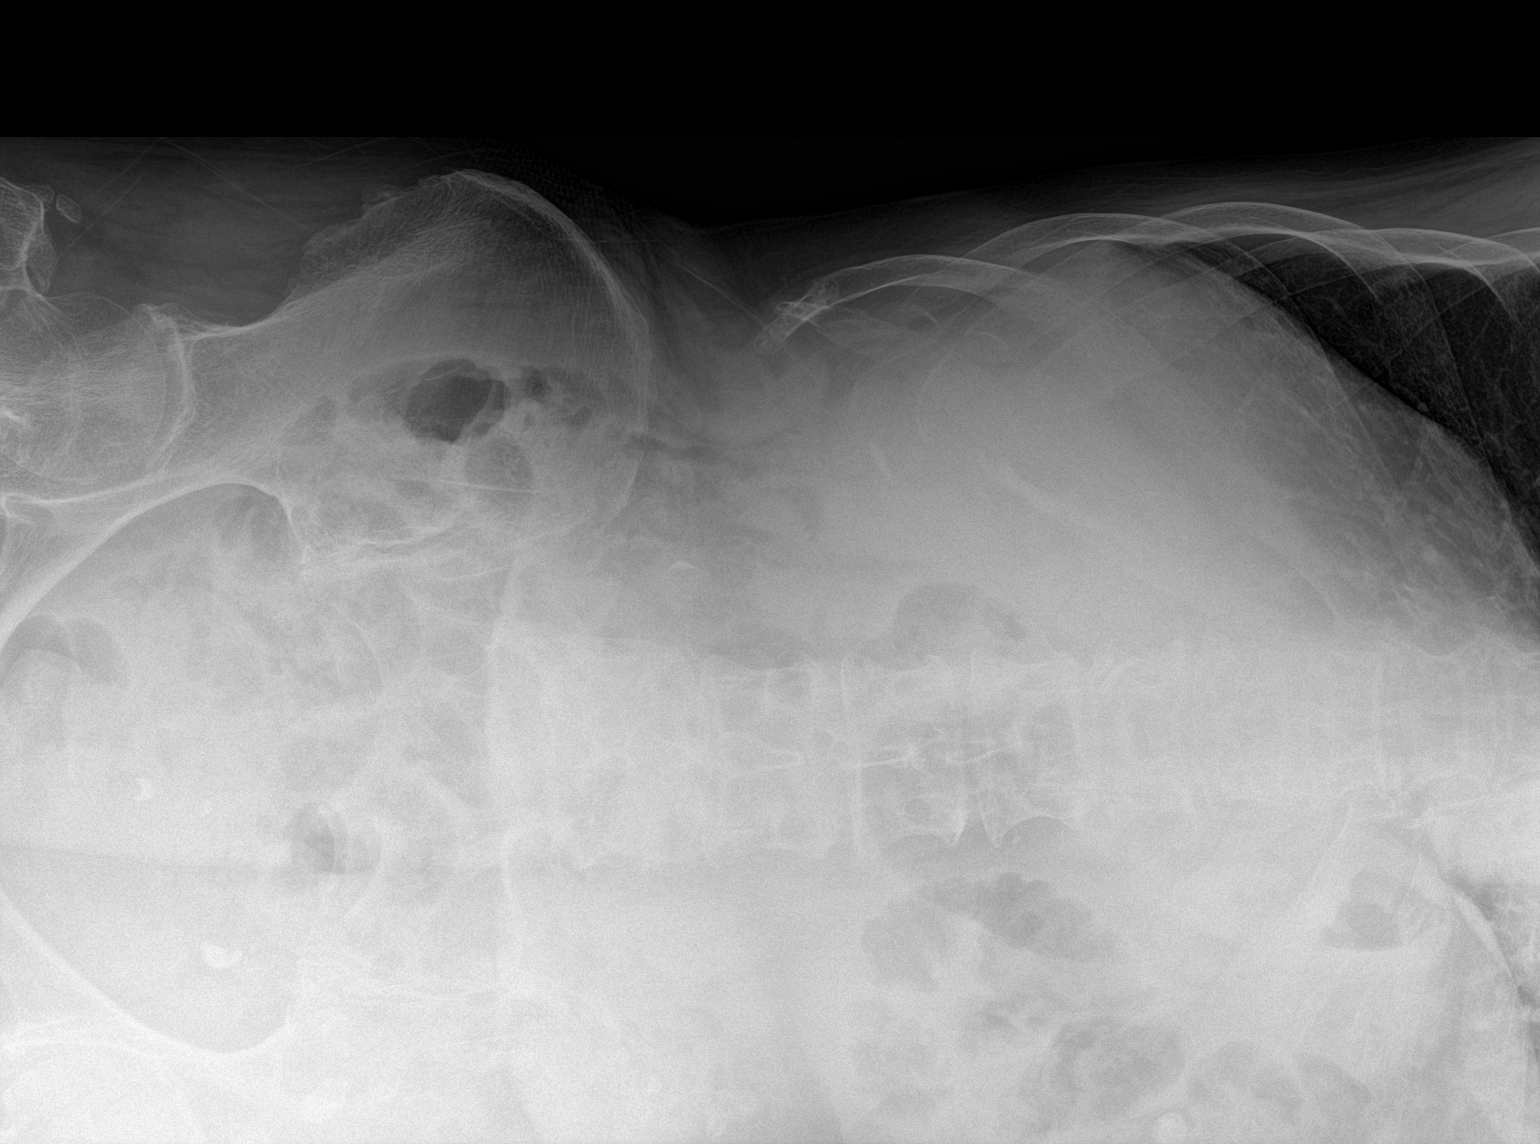

[1 of 1 positions shown; findings below may reference images not displayed]

FINDINGS: Left lateral decubitus view of the abdomen was obtained. No free air
identified. Normal bowel gas pattern.
IMPRESSION: Negative for free air.

## 2020-01-20 IMAGING — DX DG CHEST 1V PORT
1 series · 1 of 1 positions shown · non-contrast
Comparison: None.

CLINICAL DATA: Post EGD.  Rule out free air.

EXAM:
PORTABLE CHEST 1 VIEW

[chest]
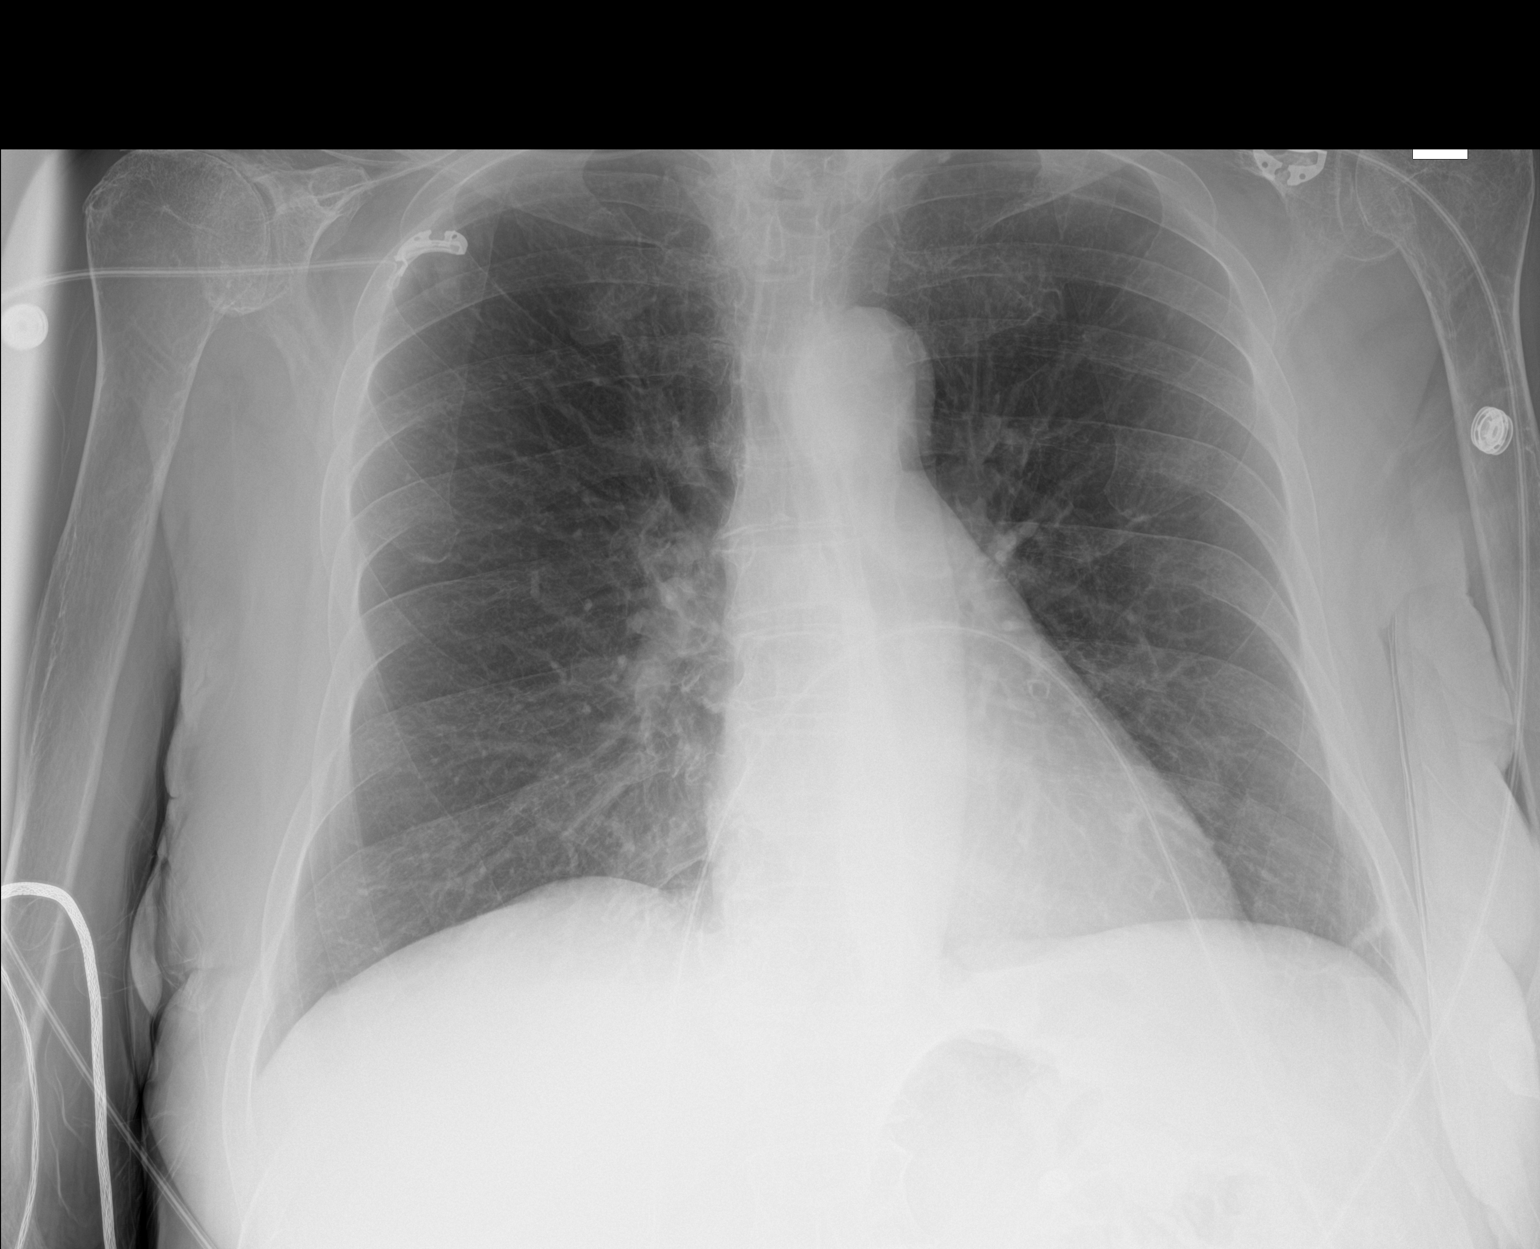

[1 of 1 positions shown; findings below may reference images not displayed]

FINDINGS: The heart size and mediastinal contours are within normal limits.
Both lungs are clear. The visualized skeletal structures are
unremarkable. No free air under the diaphragm. Degenerative change
right shoulder.
IMPRESSION: No active disease.  Negative for free air.

## 2020-01-20 SURGERY — ESOPHAGOGASTRODUODENOSCOPY (EGD) WITH PROPOFOL
Anesthesia: Monitor Anesthesia Care

## 2020-01-20 MED ORDER — CIPROFLOXACIN IN D5W 400 MG/200ML IV SOLN
INTRAVENOUS | Status: DC | PRN
  Administered 2020-01-20: 400 mg via INTRAVENOUS

## 2020-01-20 MED ORDER — FENTANYL CITRATE (PF) 100 MCG/2ML IJ SOLN
25.0000 ug | INTRAMUSCULAR | Status: DC | PRN
  Administered 2020-01-20: 25 ug via INTRAVENOUS

## 2020-01-20 MED ORDER — TRAMADOL HCL 50 MG PO TABS: 50.0000 mg | ORAL_TABLET | Freq: Three times a day (TID) | ORAL | 0 refills | Status: DC | PRN

## 2020-01-20 MED ORDER — FENTANYL CITRATE (PF) 100 MCG/2ML IJ SOLN
INTRAMUSCULAR | Status: AC
  Filled 2020-01-20: qty 2

## 2020-01-20 MED ORDER — CIPROFLOXACIN HCL 500 MG PO TABS: 500.0000 mg | ORAL_TABLET | Freq: Two times a day (BID) | ORAL | 0 refills | Status: AC

## 2020-01-20 MED ORDER — LIDOCAINE 2% (20 MG/ML) 5 ML SYRINGE
INTRAMUSCULAR | Status: DC | PRN
  Administered 2020-01-20: 100 mg via INTRAVENOUS

## 2020-01-20 MED ORDER — SODIUM CHLORIDE 0.9 % IV SOLN: INTRAVENOUS | Status: DC

## 2020-01-20 MED ORDER — LACTATED RINGERS IV SOLN: INTRAVENOUS | Status: DC | PRN

## 2020-01-20 MED ORDER — PROPOFOL 500 MG/50ML IV EMUL
INTRAVENOUS | Status: DC | PRN
  Administered 2020-01-20: 100 ug/kg/min via INTRAVENOUS

## 2020-01-20 MED ORDER — CIPROFLOXACIN IN D5W 400 MG/200ML IV SOLN
INTRAVENOUS | Status: AC
  Filled 2020-01-20: qty 200

## 2020-01-20 MED ORDER — PROPOFOL 10 MG/ML IV BOLUS
INTRAVENOUS | Status: DC | PRN
  Administered 2020-01-20 (×2): 20 mg via INTRAVENOUS
  Administered 2020-01-20: 10 mg via INTRAVENOUS
  Administered 2020-01-20: 20 mg via INTRAVENOUS

## 2020-01-20 SURGICAL SUPPLY — 15 items

## 2020-01-20 NOTE — Discharge Instructions (Signed)
YOU HAD AN ENDOSCOPIC PROCEDURE TODAY: Refer to the procedure report and other information in the discharge instructions given to you for any specific questions about what was found during the examination. If this information does not answer your questions, please call [facility] office at [phone] to clarify.   YOU SHOULD EXPECT: Some feelings of bloating in the abdomen. Passage of more gas than usual. Walking can help get rid of the air that was put into your GI tract during the procedure and reduce the bloating. If you had a lower endoscopy (such as a colonoscopy or flexible sigmoidoscopy) you may notice spotting of blood in your stool or on the toilet paper. Some abdominal soreness may be present for a day or two, also.  DIET: Your first meal following the procedure should be a light meal and then it is ok to progress to your normal diet. A half-sandwich or bowl of soup is an example of a good first meal. Heavy or fried foods are harder to digest and may make you feel nauseous or bloated. Drink plenty of fluids but you should avoid alcoholic beverages for 24 hours. If you had a esophageal dilation, please see attached instructions for diet.    ACTIVITY: Your care partner should take you home directly after the procedure. You should plan to take it easy, moving slowly for the rest of the day. You can resume normal activity the day after the procedure however YOU SHOULD NOT DRIVE, use power tools, machinery or perform tasks that involve climbing or major physical exertion for 24 hours (because of the sedation medicines used during the test).   SYMPTOMS TO REPORT IMMEDIATELY: A gastroenterologist can be reached at any hour. Please call [phone]  for any of the following symptoms:   Following lower endoscopy (colonoscopy, flexible sigmoidoscopy) Excessive amounts of blood in the stool  Significant tenderness, worsening of abdominal pains  Swelling of the abdomen that is new, acute  Fever of 100  or higher   Following upper endoscopy (EGD, EUS) Vomiting of blood or coffee ground material  New, significant abdominal pain  New, significant chest pain or pain under the shoulder blades  Painful or persistently difficult swallowing  New shortness of breath  Black, tarry-looking or red, bloody stools  FOLLOW UP:  If any biopsies were taken you will be contacted by phone or by letter within the next 1-3 weeks. Call [phone]  if you have not heard about the biopsies in 3 weeks.  Please also call with any specific questions about appointments or follow up tests. 

## 2020-01-20 NOTE — Op Note (Addendum)
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 Estimated Blood Loss:     Estimated blood loss was minimal. Procedure:                Pre-Anesthesia Assessment:                           - Prior to the procedure, a History and Physical                            was performed, and patient medications and                            allergies were reviewed. The patient's tolerance of                            previous anesthesia was also reviewed. The risks                            and benefits of the procedure and the sedation                            options and risks were discussed with the patient.                            All questions were answered, and informed consent                            was obtained. Prior Anticoagulants: The patient has  taken no previous anticoagulant or antiplatelet                            agents except for aspirin. ASA Grade Assessment:                            III - A patient with severe systemic disease. After                            reviewing the risks and benefits, the patient was                            deemed in satisfactory condition to undergo the                             procedure.                           After obtaining informed consent, the endoscope was                            passed under direct vision. Throughout the                            procedure, the patient's blood pressure, pulse, and                            oxygen saturations were monitored continuously. The                            [id number]-H190 ([id number]) Olympus gastroscope was                            introduced through the mouth, and advanced to the                            second part of duodenum. The [id number] ([id number])                            Olympus Linear EUS scope was introduced through the                            mouth, and advanced to the duodenum for ultrasound                            examination from the stomach and duodenum. After                            obtaining informed consent, the endoscope was                            passed under direct vision. Throughout the  procedure, the patient's blood pressure, pulse, and                            oxygen saturations were monitored continuously.The                            upper EUS was accomplished without difficulty. The                            patient tolerated the procedure. Scope In: Scope Out: Findings:      ENDOSCOPIC FINDING: :      No gross lesions were noted in the entire esophagus.      The Z-line was regular and was found 35 cm from the incisors.      A 2 cm hiatal hernia was present.      A medium post mucosectomy scar was found. The scar tissue was healthy in       appearance.      No other gross lesions were noted in the entire examined stomach.      No gross lesions were noted in the duodenal bulb, in the first portion       of the duodenum and in the second portion of the duodenum.      ENDOSONOGRAPHIC FINDING: :      An irregular masslike lesion was identified in the head/genu of the       pancreas. The lesion was hypoechoic. The lesion  measured 23 mm by 26 mm       in maximal cross-sectional diameter. The endosonographic borders were       poorly-defined. There was sonographic evidence suggesting invasion into       the portal vein (manifested by abutment). An intact interface was seen       between the mass and the superior mesenteric artery and celiac trunk       suggesting a lack of invasion. The remainder of the pancreas was       examined. The endosonographic appearance of parenchyma and the upstream       pancreatic duct indicated duct dilation (PDN = 7.0 mm, PDB = 5.0 mm ->       3.6 mm -> 2.1 mm, PDT = 1.5 mm), parenchymal atrophy, with some       hyperechoic stranding and no ductal or parenchymal calcifications was       also noted. Fine needle biopsy was performed. Color Doppler imaging was       utilized prior to needle puncture to confirm a lack of significant       vascular structures within the needle path. Six passes were made with       the Acquire 22 gauge ultrasound core biopsy needle using a transduodenal       approach. Visible cores of tissue were obtained. Preliminary cytologic       examination and touch preps were performed. Final cytology results are       pending.      There was no sign of significant endosonographic abnormality in the       common bile duct (7.3 mm) and in the common hepatic duct (5.4 mm). No       stones and no biliary sludge were identified.      No malignant-appearing lymph nodes were visualized in the celiac region       (  level 20), peripancreatic region and porta hepatis region.      Endosonographic imaging in the visualized portion of the liver showed no       mass.      The celiac region was visualized. Impression:               EGD Impression:                           - No gross lesions in esophagus.                           - Z-line regular, 35 cm from the incisors.                           - 2 cm hiatal hernia.                           - No gross lesions in the  stomach other than                            well-healed scar.                           - No gross lesions in the duodenal bulb, in the                            first portion of the duodenum and in the second                            portion of the duodenum.                           EUS Impression:                           - A masslike region was identified in the                            pancreatic head and genu of the pancreas. Tissue                            was obtained from this exam, and results are                            pending. However, the endosonographic appearance as                            well as imaging findings are suspicious for                            adenocarcinoma. This was staged T2 N0 Mx by                            endosonographic criteria. The staging applies if  malignancy is confirmed. Fine needle biopsy                            performed with results pending.                           - There was no sign of significant pathology in the                            common bile duct and in the common hepatic duct.                           - No malignant-appearing lymph nodes were                            visualized in the celiac region (level 20),                            peripancreatic region and porta hepatis region. Recommendation:           - The patient will be observed post-procedure,                            until all discharge criteria are met.                           - Discharge patient to home as long as no evidence                            of post-EUS pancreatitis (she had this during her                            last admission post-procedure).                           - Patient has a contact number available for                            emergencies. The signs and symptoms of potential                            delayed complications were discussed with the                             patient. Return to normal activities tomorrow.                            Written discharge instructions were provided to the                            patient.                           - Ciprofloxacin 500 mg BID x 3-days to decrease  risk of post-infectious complications.                           - Low fat diet for 1 week.                           - Observe patient's clinical course.                           - Draw CA19-9 today.                           - Monitor for signs/symptoms of bleeding,                            pancreatitis, perforation, and infection. If issues                            please call our number to get further assistance as                            needed.                           - Await cytology results and await path results.                           - The findings and recommendations were discussed                            with the patient.                           - The findings and recommendations were discussed                            with the patient's family. Procedure Code(s):        --- Professional ---                           [id number], Esophagogastroduodenoscopy, flexible,                            transoral; with transendoscopic ultrasound-guided                            intramural or transmural fine needle                            aspiration/biopsy(s), (includes endoscopic                            ultrasound examination limited to the esophagus,                            stomach or duodenum, and adjacent structures) Diagnosis Code(s):        --- Professional ---  K44.9, Diaphragmatic hernia without obstruction or                            gangrene                           K86.89, Other specified diseases of pancreas                           I89.9, Noninfective disorder of lymphatic vessels                            and lymph nodes, unspecified                            R93.3, Abnormal findings on diagnostic imaging of                            other parts of digestive tract CPT copyright 2019 [employer]. All rights reserved. The codes documented in this report are preliminary and upon coder review may  be revised to meet current compliance requirements. [name], MD 01/20/2020 12:30:42 PM Number of Addenda: 0

## 2020-01-20 NOTE — Transfer of Care (Signed)
Immediate Anesthesia Transfer of Care Note  Patient: [name]  Procedure(s) Performed: ESOPHAGOGASTRODUODENOSCOPY (EGD) WITH PROPOFOL (N/A ) UPPER ENDOSCOPIC ULTRASOUND (EUS) RADIAL (N/A ) FINE NEEDLE ASPIRATION BIOPSY  Patient Location: Endoscopy Unit  Anesthesia Type:MAC  Level of Consciousness: awake, alert  and patient cooperative  Airway & Oxygen Therapy: Patient Spontanous Breathing and Patient connected to nasal cannula oxygen  Post-op Assessment: Report given to RN and Post -op Vital signs reviewed and stable  Post vital signs: Reviewed and stable  Last Vitals:  Vitals Value Taken Time  BP 124/64   Temp    Pulse 66   Resp 12   SpO2 98     Last Pain:  Vitals:   01/20/20 0850  TempSrc: Oral  PainSc: 0-No pain         Complications: No complications documented.

## 2020-01-20 NOTE — H&P (Signed)
GASTROENTEROLOGY PROCEDURE H&P NOTE   Primary Care Physician: Lilian Coma., MD  HPI: Nancy Hawkins is a 79 y.o. female who presents for EGD/EUS of pancreatic lesion with likely FNB.  Past Medical History:  Diagnosis Date  . Diabetes mellitus without complication ([place])    type 2  . Dysrhythmia    a-fib 15 yrs ago per patient on 01/17/20  . GERD (gastroesophageal reflux disease)   . Hypercholesteremia   . Hypertension   . Pancreatic mass   . Pancreatitis    Hx  . Seasonal allergies    Past Surgical History:  Procedure Laterality Date  . ABDOMINAL HYSTERECTOMY    . BIOPSY  11/20/2019   Procedure: BIOPSY;  Surgeon: Rush Landmark Telford Nab., MD;  Location[place];  Service: Gastroenterology;;  . CHOLECYSTECTOMY    . COLONOSCOPY WITH PROPOFOL N/A 11/20/2019   Procedure: COLONOSCOPY WITH PROPOFOL;  Surgeon: Rush Landmark Telford Nab., MD;  Location: [place];  Service: Gastroenterology;  Laterality: N/A;  . ENDOSCOPIC MUCOSAL RESECTION  11/20/2019   Procedure: ENDOSCOPIC MUCOSAL RESECTION;  Surgeon: Rush Landmark Telford Nab., MD;  Location: [place];  Service: Gastroenterology;;  . ESOPHAGOGASTRODUODENOSCOPY (EGD) WITH PROPOFOL N/A 11/20/2019   Procedure: ESOPHAGOGASTRODUODENOSCOPY (EGD) WITH PROPOFOL;  Surgeon: Irving Copas., MD;  Location: [place];  Service: Gastroenterology;  Laterality: N/A;  . FINE NEEDLE ASPIRATION  11/20/2019   Procedure: FINE NEEDLE ASPIRATION (FNA) LINEAR;  Surgeon: Irving Copas., MD;  Location: [place];  Service: Gastroenterology;;  . HEMOSTASIS CLIP PLACEMENT  11/20/2019   Procedure: HEMOSTASIS CLIP PLACEMENT;  Surgeon: Irving Copas., MD;  Location: [place];  Service: Gastroenterology;;  . NASAL SINUS SURGERY    . POLYPECTOMY  11/20/2019   Procedure: POLYPECTOMY;  Surgeon: Mansouraty, Telford Nab., MD;  Location: [place];  Service: Gastroenterology;;  . Lia Foyer LIFTING INJECTION  11/20/2019    Procedure: SUBMUCOSAL LIFTING INJECTION;  Surgeon: Irving Copas., MD;  Location: [place];  Service: Gastroenterology;;  . UPPER ESOPHAGEAL ENDOSCOPIC ULTRASOUND (EUS) N/A 11/20/2019   Procedure: UPPER ESOPHAGEAL ENDOSCOPIC ULTRASOUND (EUS);  Surgeon: Irving Copas., MD;  Location: [place];  Service: Gastroenterology;  Laterality: N/A;   Current Facility-Administered Medications  Medication Dose Route Frequency Provider Last Rate Last Admin  . 0.9 %  sodium chloride infusion   Intravenous Continuous Mansouraty, Telford Nab., MD       Allergies  Allergen Reactions  . Cephalosporins Shortness Of Breath  . Erythromycin Shortness Of Breath  . Fire [employer] Breath  . Penicillins Anaphylaxis    Reaction: 25 years   Family History  Family history unknown: Yes   Social History   Socioeconomic History  . Marital status: Married    Spouse name: Not on file  . Number of children: Not on file  . Years of education: Not on file  . Highest education level: Not on file  Occupational History  . Not on file  Tobacco Use  . Smoking status: Never Smoker  . Smokeless tobacco: Never Used  Vaping Use  . Vaping Use: Never used  Substance and Sexual Activity  . Alcohol use: Not Currently  . Drug use: Never  . Sexual activity: Not on file    Comment: Hysterectomy  Other Topics Concern  . Not on file  Social History Narrative  . Not on file   Social Determinants of Health   Financial Resource Strain: Not on file  Food Insecurity: No Food Insecurity  . Worried About Charity fundraiser  in the Last Year: Never true  . Ran Out of Food in the Last Year: Never true  Transportation Needs: No Transportation Needs  . Lack of Transportation (Medical): No  . Lack of Transportation (Non-Medical): No  Physical Activity: Not on file  Stress: Not on file  Social Connections: Not on file  Intimate Partner Violence: Not on file    Physical Exam: Vital signs in  last 24 hours: Temp:  [98 F (36.7 C)] 98 F (36.7 C) (12/13 0850) Pulse Rate:  [71] 71 (12/13 0850) Resp:  [19] 19 (12/13 0850) BP: (151)/(64) 151/64 (12/13 0850) SpO2:  [100 %] 100 % (12/13 0850) Weight:  [62.1 kg] 62.1 kg (12/13 0850)   GEN: NAD EYE: Sclerae anicteric ENT: MMM CV: Non-tachycardic GI: Soft, NT/ND NEURO:  Alert & Oriented x 3  Lab Results: No results for input(s): WBC, HGB, HCT, PLT in the last 72 hours. BMET No results for input(s): NA, K, CL, CO2, GLUCOSE, BUN, CREATININE, CALCIUM in the last 72 hours. LFT No results for input(s): PROT, ALBUMIN, AST, ALT, ALKPHOS, BILITOT, BILIDIR, IBILI in the last 72 hours. PT/INR No results for input(s): LABPROT, INR in the last 72 hours.   Impression / Plan: This is a 79 y.o.female who presents for EGD/EUS of pancreatic lesion with likely FNB.  The risks of an EUS including intestinal perforation, bleeding, infection, aspiration, and medication effects were discussed as was the possibility it may not give a definitive diagnosis if a biopsy is performed.  When a biopsy of the pancreas is done as part of the EUS, there is an additional risk of pancreatitis at the rate of about 1-2%.  It was explained that procedure related pancreatitis is typically mild, although it can be severe and even life threatening, which is why we do not perform random pancreatic biopsies and only biopsy a lesion/area we feel is concerning enough to warrant the risk.  The risks and benefits of endoscopic evaluation were discussed with the patient; these include but are not limited to the risk of perforation, infection, bleeding, missed lesions, lack of diagnosis, severe illness requiring hospitalization, as well as anesthesia and sedation related illnesses.  The patient is agreeable to proceed.    [name], MD [facility] Office # [phone]

## 2020-01-20 NOTE — Telephone Encounter (Signed)
Inbound call from patient's daughter Truman Hayward asking if mother can take Advil medication for headache.  Please advise.

## 2020-01-20 NOTE — Anesthesia Postprocedure Evaluation (Signed)
Anesthesia Post Note  Patient: Nancy Hawkins  Procedure(s) Performed: ESOPHAGOGASTRODUODENOSCOPY (EGD) WITH PROPOFOL (N/A ) UPPER ENDOSCOPIC ULTRASOUND (EUS) RADIAL (N/A ) FINE NEEDLE ASPIRATION BIOPSY     Patient location during evaluation: PACU Anesthesia Type: MAC Level of consciousness: awake and alert Pain management: pain level controlled Vital Signs Assessment: post-procedure vital signs reviewed and stable Respiratory status: spontaneous breathing, nonlabored ventilation, respiratory function stable and patient connected to nasal cannula oxygen Cardiovascular status: blood pressure returned to baseline and stable Postop Assessment: no apparent nausea or vomiting Anesthetic complications: no   No complications documented.  Last Vitals:  Vitals:   01/20/20 1350 01/20/20 1353  BP:    Pulse: (!) 59 (!) 57  Resp: (!) 21 14  Temp:    SpO2: 100% 100%    Last Pain:  Vitals:   01/20/20 1313  TempSrc:   PainSc: 3                  [name]

## 2020-01-20 NOTE — Anesthesia Postprocedure Evaluation (Signed)
Anesthesia Post Note  Patient: Nancy Hawkins  Procedure(s) Performed: ESOPHAGOGASTRODUODENOSCOPY (EGD) WITH PROPOFOL (N/A ) UPPER ENDOSCOPIC ULTRASOUND (EUS) RADIAL (N/A ) FINE NEEDLE ASPIRATION BIOPSY     Anesthesia Type: MAC Anesthetic complications: no   No complications documented.  Last Vitals:  Vitals:   01/20/20 1350 01/20/20 1353  BP:    Pulse: (!) 59 (!) 57  Resp: (!) 21 14  Temp:    SpO2: 100% 100%    Last Pain:  Vitals:   01/20/20 1313  TempSrc:   PainSc: 3                  Jakorey Mcconathy COKER

## 2020-01-20 NOTE — Telephone Encounter (Signed)
The pt's daughter has been advised that the pt can take tylenol for headache. If the tylenol does not help she will call back.

## 2020-01-20 NOTE — Progress Notes (Addendum)
Patient having pain post-EUS with FNB. CXR and KUBs ordered STAT for evaluation and rule out perforation (unlikely). Query concern for post-EUS Pancreatitis (she did get this after her last procedure as well). Updated husband to plan of action at this time. Will monitor. If symptoms persist will need to consider Observation overnight vs Inpatient Admission. Will allow patient to obtain further IVFs as LR.   [name], MD [facility] Office # [phone]   PM Addendum XRays negative. Patient received 25 mcg Fentanyl x 1 dose and had resolution of discomfort over next 90 minutes of monitoring. Discussed case with patient and husband, they feel comfortable with discharge.  [name], MD [facility] Office # [phone]

## 2020-01-21 ENCOUNTER — Encounter (HOSPITAL_COMMUNITY): Payer: Self-pay | Admitting: Gastroenterology

## 2020-01-22 ENCOUNTER — Encounter: Payer: Self-pay | Admitting: Gastroenterology

## 2020-01-22 LAB — CYTOLOGY - NON PAP

## 2020-01-23 ENCOUNTER — Other Ambulatory Visit: Payer: Self-pay

## 2020-01-23 DIAGNOSIS — C259 Malignant neoplasm of pancreas, unspecified: Secondary | ICD-10-CM

## 2020-01-23 DIAGNOSIS — K8689 Other specified diseases of pancreas: Secondary | ICD-10-CM

## 2020-01-24 ENCOUNTER — Other Ambulatory Visit (INDEPENDENT_AMBULATORY_CARE_PROVIDER_SITE_OTHER): Payer: Medicare Other

## 2020-01-24 DIAGNOSIS — K8689 Other specified diseases of pancreas: Secondary | ICD-10-CM | POA: Diagnosis not present

## 2020-01-24 DIAGNOSIS — C259 Malignant neoplasm of pancreas, unspecified: Secondary | ICD-10-CM

## 2020-01-24 LAB — COMPREHENSIVE METABOLIC PANEL
ALT: 21 U/L (ref 0–35)
AST: 17 U/L (ref 0–37)
Albumin: 3.9 g/dL (ref 3.5–5.2)
Alkaline Phosphatase: 101 U/L (ref 39–117)
BUN: 16 mg/dL (ref 6–23)
CO2: 22 mEq/L (ref 19–32)
Calcium: 9.1 mg/dL (ref 8.4–10.5)
Chloride: 108 mEq/L (ref 96–112)
Creatinine, Ser: 0.6 mg/dL (ref 0.40–1.20)
GFR: 85.1 mL/min (ref >60.00)
Glucose, Bld: 224 mg/dL — ABNORMAL HIGH (ref 70–99)
Potassium: 3.9 mEq/L (ref 3.5–5.1)
Sodium: 138 mEq/L (ref 135–145)
Total Bilirubin: 0.8 mg/dL (ref 0.2–1.2)
Total Protein: 7.1 g/dL (ref 6.0–8.3)

## 2020-01-24 LAB — CBC WITH DIFFERENTIAL/PLATELET
Basophils Absolute: 0.1 10*3/uL (ref 0.0–0.1)
Basophils Relative: 1 % (ref 0.0–3.0)
Eosinophils Absolute: 0 10*3/uL (ref 0.0–0.7)
Eosinophils Relative: 0.7 % (ref 0.0–5.0)
HCT: 36.3 % (ref 36.0–46.0)
Hemoglobin: 11.8 g/dL — ABNORMAL LOW (ref 12.0–15.0)
Lymphocytes Relative: 24.7 % (ref 12.0–46.0)
Lymphs Abs: 1.8 10*3/uL (ref 0.7–4.0)
MCHC: 32.5 g/dL (ref 30.0–36.0)
MCV: 89 fl (ref 78.0–100.0)
Monocytes Absolute: 0.5 10*3/uL (ref 0.1–1.0)
Monocytes Relative: 6.5 % (ref 3.0–12.0)
Neutro Abs: 5 10*3/uL (ref 1.4–7.7)
Neutrophils Relative %: 67.1 % (ref 43.0–77.0)
Platelets: 265 10*3/uL (ref 150.0–400.0)
RBC: 4.08 Mil/uL (ref 3.87–5.11)
RDW: 16.1 % — ABNORMAL HIGH (ref 11.5–15.5)
WBC: 7.5 10*3/uL (ref 4.0–10.5)

## 2020-01-27 ENCOUNTER — Ambulatory Visit: Payer: Self-pay | Admitting: Surgery

## 2020-01-27 LAB — CANCER ANTIGEN 19-9: CA 19-9: 512 U/mL — ABNORMAL HIGH (ref <34)

## 2020-01-27 NOTE — H&P (Signed)
History of Present Illness (Nancy Hawkins L. Nancy Resides MD; 01/27/2020 3:09 PM) The patient is a 79 year old female who presents with pancreatic cancer. HPI: 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 A few months ago she started having intermittent mid-back pain. She was seen by her PCP and diagnosed with diabetes and started on metformin. The back pain became very severe prompting her to go to urgent care in October. She was found to have a blood glc of 300 with bicarb of 11, and was admitted on 11/17/19 and treated for DKA. She had a CT scan that showed acute pancreatitis with concern for a cyst/mass in the neck of the pancreas. She was discharged after 10 days in the hospital and referred for an EUS to rule out an underlying mass. She has never had any jaundice, and most recent was 0.8 on 12/17. She had an EUS with Dr. Rush Landmark on 12/13, which showed a hypoechoic 2.6cm mass in the head of the pancreas. There were no malignant-appearing nodes. FNA was positive for malignant cells consistent with adenocarcinoma. The lesion was staged as a uT2N0.  The patient reports her pain is much better. She is taking 3units Novolog with meals and 12units Glargine at bedtime. She reports she is very active and uses the treadmill almost daily.  PMH: DM, GERD, HLD  PSH: laparoscopic cholecystectomy, hysterectomy  FHx: Denies family history of hepatobiliary or pancreatic cancers.  Social: Never smoker    Past Surgical History Nancy Hawkins, [place]; 01/27/2020 2:44 PM) Colon Polyp Removal - Open  Gallbladder Surgery - Open  Hysterectomy (not due to cancer) - Partial   Diagnostic Studies History Nancy Hawkins, [place]; 01/27/2020 2:44 PM) Colonoscopy  1-5 years ago Mammogram  >3 years ago Pap Smear  >5 years ago  Allergies Nancy Hawkins, CMA; 01/27/2020 2:15 PM) Penicillins  Azithromycin *CHEMICALS*   Medication History (Nancy Hawkins,  CMA; 01/27/2020 2:19 PM) traMADol HCl (50MG  Tablet, Oral) Active. Cholecalciferol Active. Cyanocobalamin (25MCG/SPRAY Solution, Nasal) Active. Famotidine (40MG /4ML Solution, Intravenous) Active. Fexofenadine HCl (180MG  Tablet, Oral) Active. Guaifen PSE (600-120MG  Tablet ER 12HR, Oral) Active. Triamcinolone Acetonide (55MCG/ACT Aerosol, Nasal) Active. Insulin Aspart (100UNIT/ML Soln Cartridge, Subcutaneous) Active. Insulin Glargine-yfgn (100UNIT/ML Solution, Subcutaneous) Active.  Social History Nancy Hawkins, [place]; 01/27/2020 2:44 PM) Alcohol use  Occasional alcohol use. Caffeine use  Tea. No drug use  Tobacco use  Never smoker.  Family History Nancy Hawkins, [place]; 01/27/2020 2:44 PM) Arthritis  Father.  Pregnancy / Birth History Nancy Hawkins, [place]; 01/27/2020 2:44 PM) Age at menarche  22 years. Age of menopause  24-60 Contraceptive History  Oral contraceptives. Gravida  5 Length (months) of breastfeeding  3-6 Maternal age  82-20 Para  46  Other Problems Nancy Hawkins, [place]; 01/27/2020 2:44 PM) Arthritis  Back Pain  Diabetes Mellitus  Gastroesophageal Reflux Disease  High blood pressure  Hypercholesterolemia  Other disease, cancer, significant illness  Pancreatic Cancer  Pancreatitis     Review of Systems Nancy Hawkins CMA; 01/27/2020 2:46 PM) General Not Present- Appetite Loss, Chills, Fatigue, Fever, Night Sweats, Weight Gain and Weight Loss. Skin Present- Dryness. Not Present- Change in Wart/Mole, Hives, Jaundice, New Lesions, Non-Healing Wounds, Rash and Ulcer. HEENT Present- Ringing in the Ears, Sinus Pain and Wears glasses/contact lenses. Not Present- Earache, Hearing Loss, Hoarseness, Nose Bleed, Oral Ulcers, Seasonal Allergies, Sore Throat, Visual Disturbances and Yellow Eyes. Respiratory Not Present- Bloody sputum, Chronic Cough, Difficulty Breathing, Snoring and Wheezing. Breast Not Present- Breast Mass, Breast Pain,  Nipple Discharge and Skin Changes. Cardiovascular Not Present- Chest Pain, Difficulty Breathing Lying Down, Leg Cramps, Palpitations, Rapid Heart Rate, Shortness of Breath and Swelling of Extremities. Gastrointestinal Not Present- Abdominal Pain, Bloating, Bloody Stool, Change in Bowel Habits, Chronic diarrhea, Constipation, Difficulty Swallowing, Excessive gas, Gets full quickly at meals, Hemorrhoids, Indigestion, Nausea, Rectal Pain and Vomiting. Female Genitourinary Not Present- Frequency, Nocturia, Painful Urination, Pelvic Pain and Urgency. Musculoskeletal Present- Back Pain and Joint Pain. Not Present- Joint Stiffness, Muscle Pain, Muscle Weakness and Swelling of Extremities. Neurological Not Present- Decreased Memory, Fainting, Headaches, Numbness, Seizures, Tingling, Tremor, Trouble walking and Weakness. Psychiatric Not Present- Anxiety, Bipolar, Change in Sleep Pattern, Depression, Fearful and Frequent crying. Endocrine Present- New Diabetes. Not Present- Cold Intolerance, Excessive Hunger, Hair Changes, Heat Intolerance and Hot flashes. Hematology Not Present- Blood Thinners, Easy Bruising, Excessive bleeding, Gland problems, HIV and Persistent Infections.  Vitals (Nancy Hawkins CMA; 01/27/2020 2:19 PM) 01/27/2020 2:19 PM Weight: 138.38 lb Height: 64in Body Surface Area: 1.67 m Body Mass Index: 23.75 kg/m  Temp.: 97.93F  Pulse: 96 (Regular)  P.OX: 97% (Room air) BP: 126/78(Sitting, Left Arm, Standard)       Physical Exam (Nancy Hawkins L. Nancy Resides MD; 01/27/2020 3:09 PM) The physical exam findings are as follows: Note: Constitutional: No acute distress; conversant; no deformities Neuro: alert and oriented; cranial nerves grossly in tact; no focal deficits Eyes: Moist conjunctiva; anicteric sclerae; extraocular movements in tact Neck: Trachea midline Lungs: Normal respiratory effort; lungs clear to auscultation bilaterally; symmetric chest wall expansion CV:  Regular rate and rhythm; no murmurs; no pitting edema GI: Abdomen soft, nontender, nondistended; no masses or organomegaly; well-healed surgical scars MSK: Normal gait and station; no clubbing/cyanosis Psychiatric: Appropriate affect; alert and oriented 3    Assessment & Plan (Demaris Leavell L. Nancy Resides MD; 01/27/2020 3:17 PM) PANCREATIC ADENOCARCINOMA (C25.9) Impression: 79 yo female presenting with a newly-diagnosed HOP adenocarcinoma without biliary obstruction. This was diagnosed after she developed new-onset diabetes and acute pancreatitis. I reviewed her referral notes and her imaging, including her 2 most recent CT scans. Her last scan on 12/3 shows significant resolution of the peripancreatic stranding. 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 SMA appears to be free of disease. Given the PV/SMV involvement this is a borderline-resectable pancreatic adenocarcinoma and I would recommend 6 cycles of neoadjuvant chemotherapy prior to surgical resection. I discussed the multiple treatment modalities of pancreatic cancer with the patient, including chemotherapy and surgery. I explained that surgery is the only potentially curative therapy and that she will require a Whipple for resection, with possible portal vein resection and reconstruction. She is scheduled to see medical oncology tomorrow, and I will discuss her case at tumor board. Baseline CA 19-9 is pending and she is scheduled for a chest CT to complete her staging workup. After med onc evaluation I will schedule her for portacath placement once we have a final treatment plan. I discussed this procedure with the patient and went through the risks, including small risk of pneumothorax.   Michaelle Birks, [place]  Surgery General, Hepatobiliary and Pancreatic Surgery 01/27/20 5:40 PM

## 2020-01-28 ENCOUNTER — Other Ambulatory Visit: Payer: Self-pay

## 2020-01-28 ENCOUNTER — Other Ambulatory Visit (INDEPENDENT_AMBULATORY_CARE_PROVIDER_SITE_OTHER): Payer: Medicare Other

## 2020-01-28 ENCOUNTER — Inpatient Hospital Stay: Payer: Medicare Other | Attending: Nurse Practitioner | Admitting: Nurse Practitioner

## 2020-01-28 VITALS — BP 151/73 | HR 92 | Temp 97.7°F | Resp 16 | Ht 64.0 in | Wt 137.0 lb

## 2020-01-28 DIAGNOSIS — C250 Malignant neoplasm of head of pancreas: Secondary | ICD-10-CM | POA: Diagnosis not present

## 2020-01-28 DIAGNOSIS — K8689 Other specified diseases of pancreas: Secondary | ICD-10-CM

## 2020-01-28 DIAGNOSIS — I10 Essential (primary) hypertension: Secondary | ICD-10-CM

## 2020-01-28 DIAGNOSIS — E119 Type 2 diabetes mellitus without complications: Secondary | ICD-10-CM | POA: Diagnosis not present

## 2020-01-28 NOTE — Progress Notes (Addendum)
New Hematology/Oncology Consult   Requesting MD: Dr. Rush Landmark  N/A  Reason for Consult: Pancreas cancer  HPI: Nancy Hawkins is a 79 year old woman hospitalized 11/17/2019 with back pain.  She was diagnosed with acute pancreatitis and also found to be diabetic.  CT abdomen/pelvis showed inflammation involving the mid body of the pancreas with peripancreatic fat stranding; possible 2.5 x 2.2 cm low-attenuation mass within the mid body of the pancreas, appear to abut the celiac axis as well as the splenic vein.  MRI 11/18/2019 with extensive inflammatory fat stranding about the pancreas with a thinly septated multicystic fluid signal lesion of the anterior pancreatic neck measuring 3.2 x 1.7 cm most consistent with a pancreatic pseudocyst; numerous additional small fluid signal lesions throughout the pancreatic parenchyma, the remaining lesions subcentimeter; central portion of pancreatic duct dilated measuring up to 8 mm without obvious obstructing lesion identified; trace ascites; trace bilateral pleural effusions and associated atelectasis or consolidation.  She underwent a colonoscopy on 11/20/2019 which showed 5 polyps (resected and retrieved- multiple tubular adenomas, negative for high-grade dysplasia); 2 colonic angioectasias; diverticulosis in the entire examined colon; nonbleeding nonthrombosed external and internal hemorrhoids.  She also underwent an EGD/EUS with findings of a few white nummular lesions in the entire esophagus (esophagus biopsy with no significant pathologic findings, no fungal elements); hematin found in the entire examined stomach; gastritis (stomach biopsy showed gastropathy/gastritis); 14 mm semisessile polyp found in the posterior wall of the gastric antrum (hyperplastic polyp); a lesion was identified in the pancreatic body staged T2 N0 MX.  FNA of the pancreas lesion showed benign reactive/reparative changes, prominent inflammation.  She was ultimately discharged home  11/28/2019.  She underwent a second EUS procedure 01/20/2020 with a masslike region identified in the pancreatic head, stage T2 N0 MX.  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  She saw Dr. [name] yesterday with the recommendation for neoadjuvant chemotherapy prior to surgical resection.     Past Medical History:  Diagnosis Date  . Diabetes mellitus without complication ([place])    type 2  . Dysrhythmia    a-fib 15 yrs ago per patient on 01/17/20  . GERD (gastroesophageal reflux disease)   . Hypercholesteremia   . Hypertension   . Pancreatic mass   . Pancreatitis    Hx  . Seasonal allergies   :   Past Surgical History:  Procedure Laterality Date  . ABDOMINAL HYSTERECTOMY    . BIOPSY  11/20/2019   Procedure: BIOPSY;  Surgeon: Rush Landmark Telford Nab., MD;  Location: [place] [facility];  Service: Gastroenterology;;  . CHOLECYSTECTOMY    . COLONOSCOPY WITH PROPOFOL N/A 11/20/2019   Procedure: COLONOSCOPY WITH PROPOFOL;  Surgeon: Rush Landmark Telford Nab., MD;  Location: [facility];  Service: Gastroenterology;  Laterality: N/A;  . ENDOSCOPIC MUCOSAL RESECTION  11/20/2019   Procedure: ENDOSCOPIC MUCOSAL RESECTION;  Surgeon: Rush Landmark Telford Nab., MD;  Location: [place];  Service: Gastroenterology;;  . ESOPHAGOGASTRODUODENOSCOPY (EGD) WITH PROPOFOL N/A 11/20/2019   Procedure: ESOPHAGOGASTRODUODENOSCOPY (EGD) WITH PROPOFOL;  Surgeon: Irving Copas., MD;  Location: [place];  Service: Gastroenterology;  Laterality: N/A;  . ESOPHAGOGASTRODUODENOSCOPY (EGD) WITH PROPOFOL N/A 01/20/2020   Procedure: ESOPHAGOGASTRODUODENOSCOPY (EGD) WITH PROPOFOL;  Surgeon: Rush Landmark Telford Nab., MD;  Location: [place];  Service: Gastroenterology;  Laterality: N/A;  . EUS N/A 01/20/2020   Procedure: UPPER ENDOSCOPIC ULTRASOUND (EUS) RADIAL;  Surgeon: Rush Landmark Telford Nab., MD;  Location: [place];  Service: Gastroenterology;  Laterality: N/A;  . FINE NEEDLE  ASPIRATION  11/20/2019   Procedure: FINE NEEDLE  ASPIRATION (FNA) LINEAR;  Surgeon: Rush Landmark Telford Nab., MD;  Location: [place];  Service: Gastroenterology;;  . FINE NEEDLE ASPIRATION BIOPSY  01/20/2020   Procedure: FINE NEEDLE ASPIRATION BIOPSY;  Surgeon: Rush Landmark Telford Nab., MD;  Location: [place];  Service: Gastroenterology;;  . HEMOSTASIS CLIP PLACEMENT  11/20/2019   Procedure: HEMOSTASIS CLIP PLACEMENT;  Surgeon: Irving Copas., MD;  Location: [place] [facility];  Service: Gastroenterology;;  . NASAL SINUS SURGERY    . POLYPECTOMY  11/20/2019   Procedure: POLYPECTOMY;  Surgeon: Mansouraty, Telford Nab., MD;  Location: [place];  Service: Gastroenterology;;  . Lia Foyer LIFTING INJECTION  11/20/2019   Procedure: SUBMUCOSAL LIFTING INJECTION;  Surgeon: Irving Copas., MD;  Location: [facility];  Service: Gastroenterology;;  . UPPER ESOPHAGEAL ENDOSCOPIC ULTRASOUND (EUS) N/A 11/20/2019   Procedure: UPPER ESOPHAGEAL ENDOSCOPIC ULTRASOUND (EUS);  Surgeon: Irving Copas., MD;  Location: [place];  Service: Gastroenterology;  Laterality: N/A;  :   Current Outpatient Medications:  .  aspirin EC 81 MG tablet, Take 81 mg by mouth daily. Swallow whole., Disp: , Rfl:  .  cholecalciferol (VITAMIN D) 25 MCG (1000 UNIT) tablet, Take 1,000 Units by mouth daily., Disp: , Rfl:  .  Cyanocobalamin (VITAMIN B-12 PO), Take 1 tablet by mouth daily., Disp: , Rfl:  .  diltiazem (TIAZAC) 120 MG 24 hr capsule, Take 120 mg by mouth daily., Disp: , Rfl:  .  Ensure Max Protein (ENSURE MAX PROTEIN) LIQD, Take 330 mLs (11 oz total) by mouth 2 (two) times daily with a meal. (Patient taking differently: Take 11 oz by mouth daily.), Disp: , Rfl:  .  famotidine (PEPCID) 40 MG tablet, Take 40 mg by mouth daily., Disp: , Rfl:  .  fexofenadine (ALLEGRA) 180 MG tablet, Take 180 mg by mouth daily., Disp: , Rfl:  .  guaiFENesin (MUCINEX) 600 MG 12 hr tablet, Take 600 mg by mouth  daily., Disp: , Rfl:  .  ibuprofen (ADVIL) 200 MG tablet, Take 400 mg by mouth every 6 (six) hours as needed for headache or moderate pain., Disp: , Rfl:  .  insulin aspart (NOVOLOG) 100 UNIT/ML FlexPen, Inject 3 Units into the skin 3 (three) times daily with meals., Disp: 15 mL, Rfl: 0 .  Insulin Glargine (BASAGLAR KWIKPEN) 100 UNIT/ML, Inject 12 Units into the skin daily. (Patient taking differently: Inject 12 Units into the skin at bedtime.), Disp: 15 mL, Rfl: 0 .  Multiple Vitamins-Minerals (MULTIVITAMIN WITH MINERALS) tablet, Take 1 tablet by mouth daily., Disp: , Rfl:  .  Multiple Vitamins-Minerals (OCUVITE EYE HEALTH FORMULA PO), Take 1 tablet by mouth daily., Disp: , Rfl:  .  Probiotic Product (PROBIOTIC DAILY) CAPS, Take 1 tablet by mouth daily., Disp: , Rfl:  .  telmisartan (MICARDIS) 40 MG tablet, Take 40 mg by mouth daily., Disp: , Rfl:  .  traMADol (ULTRAM) 50 MG tablet, Take 1 tablet (50 mg total) by mouth every 8 (eight) hours as needed for moderate pain., Disp: 12 tablet, Rfl: 0 .  triamcinolone (NASACORT) 55 MCG/ACT AERO nasal inhaler, Place 2 sprays into the nose daily as needed (Congestion)., Disp: , Rfl: :  :   Allergies  Allergen Reactions  . Cephalosporins Shortness Of Breath  . Erythromycin Shortness Of Breath  . Fire [employer] Breath  . Penicillins Anaphylaxis    Reaction: 25 years  :  FH: No family history of malignancy.  SOCIAL HISTORY: Nancy Hawkins lives in [place].  She is married.  She has 5 children.  She is retired.  She was previously employed as a Chartered certified accountant.  She denies tobacco use.  She reports occasional alcohol intake.  Review of Systems: At present she is feeling well.  She reports a good appetite and good energy level.  She is very active.  She has occasional back pain.  No abdominal pain.  No fevers or sweats.  She denies bleeding.  No shortness of breath or cough.  No change in bowel habits.  No nausea or vomiting.  No urinary symptoms.  No  numbness or tingling in the hands or feet  Physical Exam:  Blood pressure (!) 151/73, pulse 92, temperature 97.7 F (36.5 C), temperature source Tympanic, resp. rate 16, height 5\' 4"  (1.626 m), weight 137 lb (62.1 kg), SpO2 100 %.  HEENT: Neck without mass. Lungs: Lungs clear bilaterally. Cardiac: Regular rate and rhythm. Abdomen: Abdomen soft and nontender.  No hepatomegaly.  No mass.  Vascular: No leg edema. Lymph nodes: No palpable cervical, supraclavicular or axillary lymph nodes. Neurologic: Alert and oriented.  Follows commands. Skin: No rash.  LABS: 01/24/2020 CA [id number]  RADIOLOGY:  CT ABDOMEN W CONTRAST  Result Date: 01/10/2020 CLINICAL DATA:  Persistent pancreatitis EXAM: CT ABDOMEN WITH CONTRAST TECHNIQUE: Multidetector CT imaging of the abdomen was performed using the standard protocol following bolus administration of intravenous contrast. CONTRAST:  181mL OMNIPAQUE IOHEXOL 300 MG/ML  SOLN COMPARISON:  CT examination from November 24, 2019 FINDINGS: Lower chest: Lung bases are clear. No effusion. No consolidation. Calcification of LEFT and RIGHT coronary circulation. Hepatobiliary: Hepatic steatosis. Portal vein is patent. No focal suspicious hepatic lesion. Pancreas: Ductal dilation of the pancreas with near complete resolution of fluid, peripancreatic necrosis and improvement with respect to stranding in the lesser sac with only minimal stranding seen adjacent to the pancreatic body, proximal body on the current study. Infiltration of the fat around the splenic artery. Main pancreatic duct dilated to 6 mm, more dilated than on the prior exam in the neck of the pancreas. This terminates in an area of decreased attenuation measuring 2.3 x 1.5 cm in the pancreatic head extending in the uncinate process. This area abuts and distorts the portal vein. Distortion of the portal vein was present on the prior study but there was obvious acute pancreatitis on the previous exam. Some  peripancreatic stranding seen on today's study. This is much less intense than on the prior exam. There are portosystemic collaterals in the upper abdomen with engorgement of the gastrocolic trunk and gastroepiploic veins. Imaging findings in the head of the pancreas represent a change from the previous exam. Spleen: Normal Adrenals/Urinary Tract: Adrenal glands are normal. Symmetric renal enhancement. No sign of hydronephrosis. Small cysts in the LEFT kidney. Stomach/Bowel: Abutting the posterior stomach there is soft tissue stranding that is inseparable from the pancreatic bed. There was fluid attenuation on the prior exam in this location and extensive stranding, this is markedly improved. No acute gastrointestinal process. Signs of colonic diverticulosis. Pelvic bowel loops are not imaged. 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 venous occlusion, chronic. Portosystemic collaterals in the upper abdomen. Retroaortic LEFT renal vein. There is no gastrohepatic or hepatoduodenal ligament lymphadenopathy. No retroperitoneal or mesenteric lymphadenopathy. Other: No ascites.  No free air. Musculoskeletal: Spinal degenerative changes. Signs of compression fractures in the lumbar and lower thoracic spine with no change in appearance compared to previous imaging. IMPRESSION: 1. Focal area of hypoattenuation in the pancreatic head with subtle change in the contour of the pancreatic head  associated with abrupt ductal cut off. 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 Endoscopic ultrasound assessment of this area is suggested for further evaluation. 2. Improving signs of pancreatitis and peripancreatic necrosis with only a small amount of fluid adjacent to the dilated duct measuring 10 x 7 mm. Stranding in the area is also diminished when compared to previous imaging. 3. Stable signs of portal venous narrowing at the neck of  the pancreas, finding could be seen in both neoplasm or sequela of pancreatitis but are suspicious given adjacent findings of potential isodense or nearly isodense pancreatic lesion with ductal obstruction. Finding is associated with portosystemic collaterals in the upper abdomen. 4. Hepatic steatosis. 5. Aortic atherosclerosis. These results will be called to the ordering clinician or representative by the Radiologist Assistant, and communication documented in the PACS or [employer]. Aortic Atherosclerosis (ICD10-I70.0). Electronically Signed   By: Zetta Bills M.D.   On: 01/10/2020 16:19   DG Chest Port 1 View  Result Date: 01/20/2020 CLINICAL DATA:  Post EGD.  Rule out free air. EXAM: PORTABLE CHEST 1 VIEW COMPARISON:  None. FINDINGS: The heart size and mediastinal contours are within normal limits. Both lungs are clear. The visualized skeletal structures are unremarkable. No free air under the diaphragm. Degenerative change right shoulder. IMPRESSION: No active disease.  Negative for free air. Electronically Signed   By: Franchot Gallo M.D.   On: 01/20/2020 13:15   DG Abd Decub  Result Date: 01/20/2020 CLINICAL DATA:  Post EGD.  Rule out free air. EXAM: ABDOMEN - 1 VIEW DECUBITUS COMPARISON:  01/20/2020 FINDINGS: Left lateral decubitus view of the abdomen was obtained. No free air identified. Normal bowel gas pattern. IMPRESSION: Negative for free air. Electronically Signed   By: Franchot Gallo M.D.   On: 01/20/2020 13:16   DG Abd Portable 2V  Result Date: 01/20/2020 CLINICAL DATA:  Post EGD.  Rule out free air EXAM: PORTABLE ABDOMEN - 2 VIEW COMPARISON:  11/23/2019 normal bowel gas FINDINGS: Normal bowel gas pattern without dilated bowel loops. No free air on this upright view. Barium in tics in the left colon and sigmoid colon. Surgical clips right upper quadrant. IMPRESSION: Normal bowel gas pattern.  No free air on this upright view. Electronically Signed   By: Franchot Gallo M.D.    On: 01/20/2020 13:15    Assessment and Plan:   1. Pancreas cancer, borderline resectable   CT abdomen/pelvis-inflammation involving the mid body of the pancreas with peripancreatic fat stranding; possible 2.5 x 2.2 cm low-attenuation mass within the mid body of the pancreas, appears to abut the celiac axis as well as the splenic vein.    MRI 11/18/2019-extensive inflammatory fat stranding about the pancreas with a thinly septated multicystic fluid signal lesion of the anterior pancreatic neck measuring 3.2 x 1.7 cm most consistent with a pancreatic pseudocyst; numerous additional small fluid signal lesions throughout the pancreatic parenchyma, the remaining lesions subcentimeter; central portion of pancreatic duct dilated measuring up to 8 mm without obvious obstructing lesion identified; trace ascites; trace bilateral pleural effusions and associated atelectasis or consolidation.    Colonoscopy 11/20/2019-5 polyps (resected and retrieved- multiple tubular adenomas, negative for high-grade dysplasia); 2 colonic angioectasias; diverticulosis in the entire examined colon; nonbleeding nonthrombosed external and internal hemorrhoids.    EGD/EUS 11/20/2019-few white nummular lesions in the entire esophagus (esophagus biopsy with no significant pathologic findings, no fungal elements); hematin found in the entire examined stomach; gastritis (stomach biopsy  showed gastropathy/gastritis); 14 mm semisessile polyp found in the posterior wall of the gastric antrum (hyperplastic polyp); a lesion was identified in the pancreatic body staged T2 N0 MX.  FNA of the pancreas lesion showed benign reactive/reparative changes, prominent inflammation.    EUS procedure 01/20/2020-masslike region identified in the pancreatic head, stage T2 N0 MX.  Cytology-malignant cells consistent with adenocarcinoma.  CTs chest/abdomen/pelvis 01/29/2020 2. Pancreatitis October 2021 3. Diabetes October 2021 4. Isolated episode of  atrial fibrillation approximately 15 years ago 5. Hypertension  Nancy Hawkins presented with pancreatitis and diabetes in October of this year.  She was found to have a pancreatic head mass with recent biopsy confirming malignant cells consistent with adenocarcinoma, T2 N0 MX by EUS.  She has seen Dr. [name].  Given the portal vein/SMV involvement she has borderline resectable disease.  The plan is for neoadjuvant chemotherapy prior to surgical resection.  Dr. Benay Spice reviewed the options for chemotherapy including FOLFIRINOX versus gemcitabine/Abraxane.  She has a good performance status and can likely tolerate FOLFIRINOX.  She agrees with this plan.  We reviewed potential toxicities associated with chemotherapy including bone marrow toxicity, nausea, hair loss, allergic reaction.  We reviewed the various forms of neuropathy associated with oxaliplatin.  We discussed potential side effects associated with 5-fluorouracil including mouth sores, diarrhea, hand-foot syndrome, skin hyperpigmentation, skin rash, increased sensitivity to sun.  We discussed the diarrhea associated with irinotecan. She understands the diarrhea could be severe.  She agrees to proceed.  She will attend a chemotherapy education class.  For the first cycle she will receive FOLFOX with the plan to add irinotecan with cycle 2 or 3 pending tolerance.  She understands this regimen requires a Port-A-Cath.  We will contact Dr. [name] for Port-A-Cath placement.  We discussed the steroid premedication for this chemotherapy regimen.  She will request an insulin sliding scale when she sees endocrinology next week.  She will return for follow-up and cycle 1 FOLFOX the week of 02/10/2019.  She will contact the office in the interim with any problems.  Patient seen with Dr. Benay Spice.   Ned Card, NP 01/28/2020, 4:45 PM  This was a shared visit with Ned Card.  Nancy Hawkins was interviewed and examined.  She has been diagnosed with borderline  resectable pancreas cancer.  She understands surgery is the only potential curable treatment for pancreas adenocarcinoma.  Dr. [name] recommends neoadjuvant chemotherapy.  I agree.  Nancy Hawkins appears to be a candidate for FOLFIRINOX.  We reviewed potential toxicities associated with the FOLFIRINOX regimen.  She agrees to proceed.  The plan is to begin with FOLFOX.  Irinotecan will be added with cycle 2 or cycle 3.  She will be referred to Dr. [name] for Port-A-Cath placement.  The plan is to begin neoadjuvant therapy on 02/10/2019. She will obtain an insulin sliding scale from her endocrinologist prior to beginning chemotherapy.  Her case will be presented at the GI tumor conference.  Julieanne Manson, MD

## 2020-01-29 ENCOUNTER — Ambulatory Visit (HOSPITAL_COMMUNITY)
Admission: RE | Admit: 2020-01-29 | Discharge: 2020-01-29 | Disposition: A | Discharge status: Home / Self Care | Payer: Medicare Other | Source: Ambulatory Visit | Attending: Gastroenterology | Admitting: Gastroenterology

## 2020-01-29 DIAGNOSIS — K8689 Other specified diseases of pancreas: Secondary | ICD-10-CM | POA: Diagnosis present

## 2020-01-29 DIAGNOSIS — C259 Malignant neoplasm of pancreas, unspecified: Secondary | ICD-10-CM | POA: Insufficient documentation

## 2020-01-29 DIAGNOSIS — C250 Malignant neoplasm of head of pancreas: Secondary | ICD-10-CM | POA: Insufficient documentation

## 2020-01-29 LAB — IBC + FERRITIN
Ferritin: 61.2 ng/mL (ref 10.0–291.0)
Iron: 29 ug/dL — ABNORMAL LOW (ref 42–145)
Saturation Ratios: 7.4 % — ABNORMAL LOW (ref 20.0–50.0)
Transferrin: 280 mg/dL (ref 212.0–360.0)

## 2020-01-29 LAB — VITAMIN B12: Vitamin B-12: 1035 pg/mL — ABNORMAL HIGH (ref 211–911)

## 2020-01-29 LAB — FOLATE: Folate: >23.6 ng/mL (ref >5.9)

## 2020-01-29 IMAGING — CT CT CHEST W/ CM
2 of 3 series · 15 of 36 positions shown, 18 images · IV contrast (APPLIED)
Comparison: None

CLINICAL DATA: Pancreas adenocarcinoma.  Staging.

EXAM:
CT CHEST WITH CONTRAST
TECHNIQUE: Multidetector CT imaging of the chest was performed during
intravenous contrast administration.
CONTRAST:  100mL OMNIPAQUE IOHEXOL 300 MG/ML  SOLN

[Series 1: thorax 2.0 i31f 2 · axial · 0.73mm/px · z∈[+1378,+1640]mm · 12 of 155 slices shown, 15 images]
[im 12/155  mediastinal]
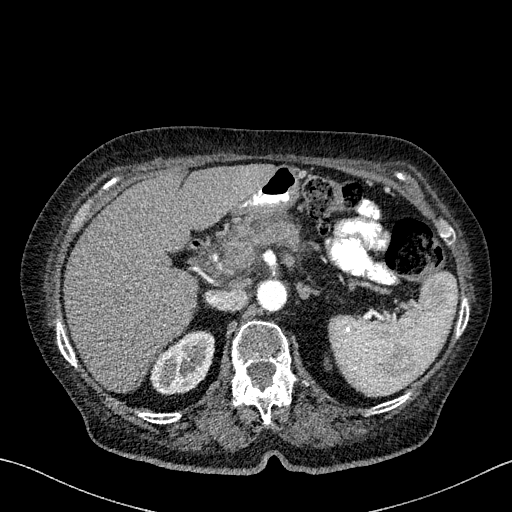
[im 12/155  lung]
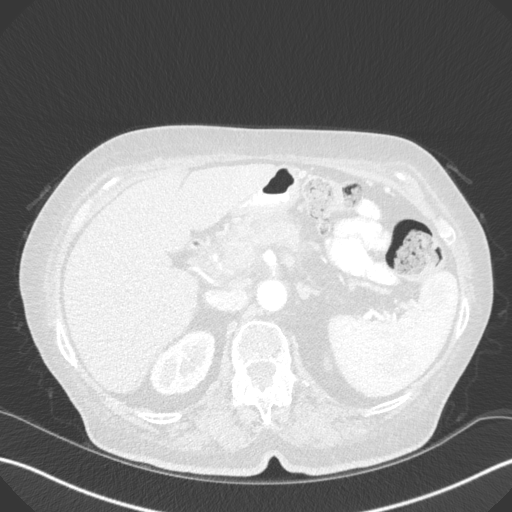
[im 23/155  lung]
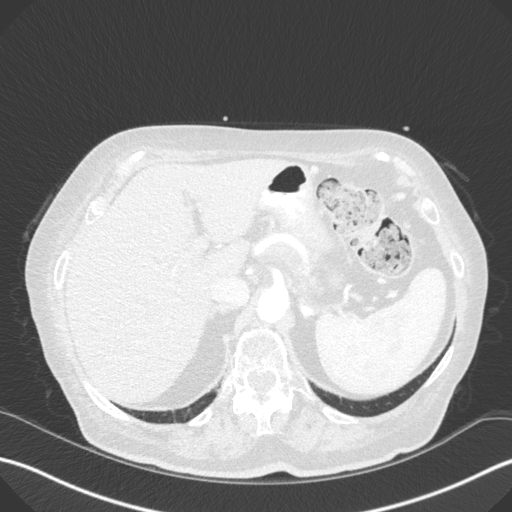
[im 35/155  lung]
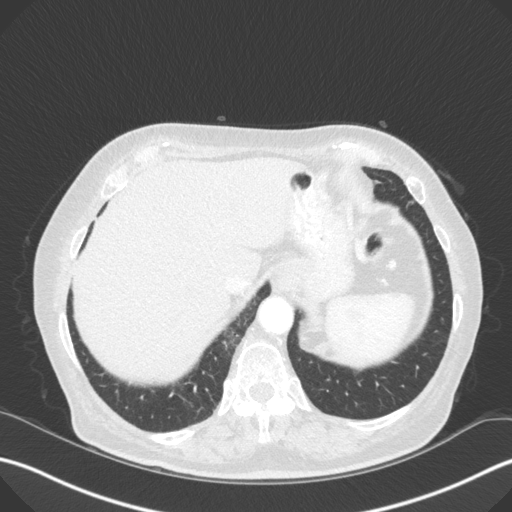
[im 46/155  lung]
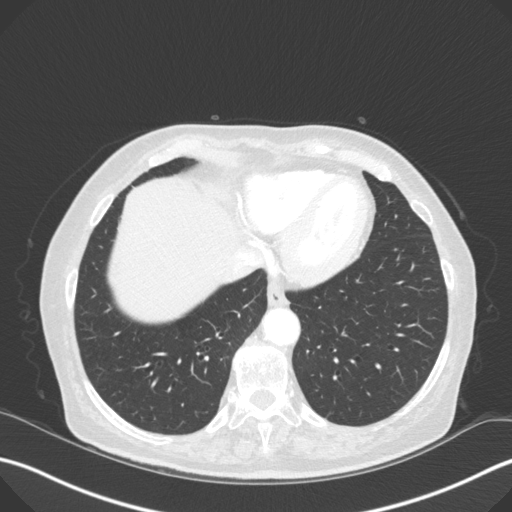
[im 58/155  mediastinal]
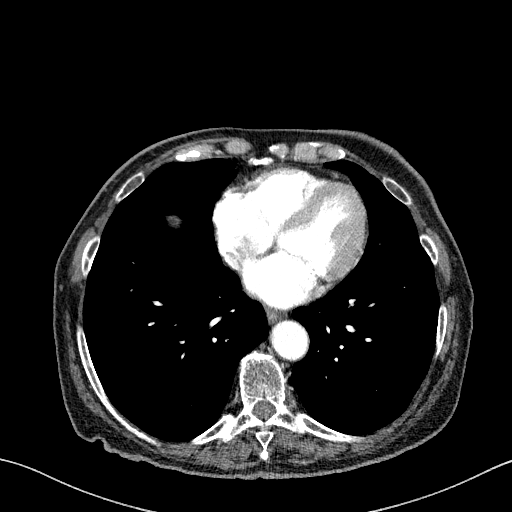
[im 58/155  lung]
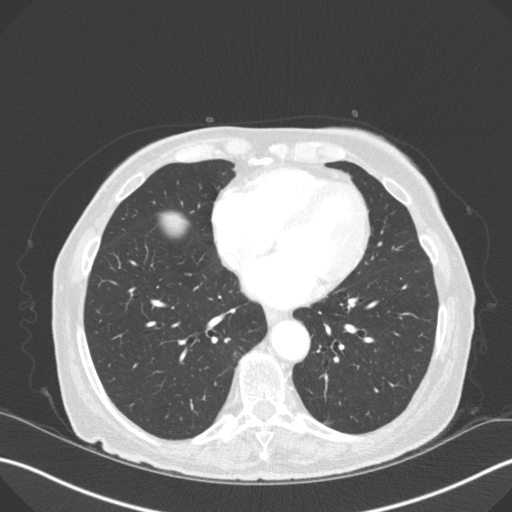
[im 69/155  lung]
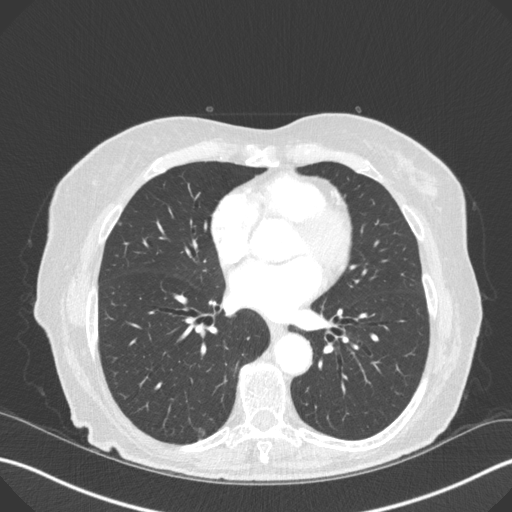
[im 86/155  lung]
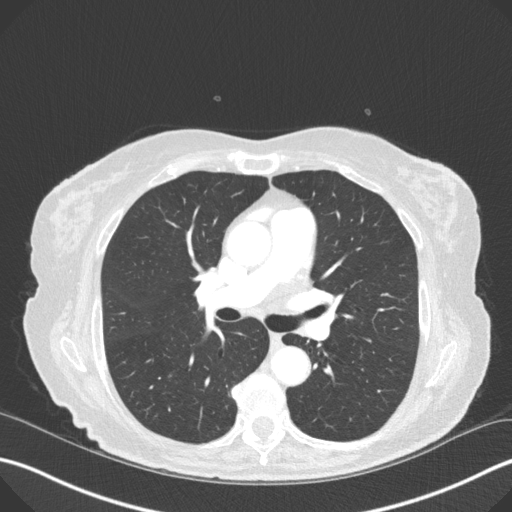
[im 97/155  lung]
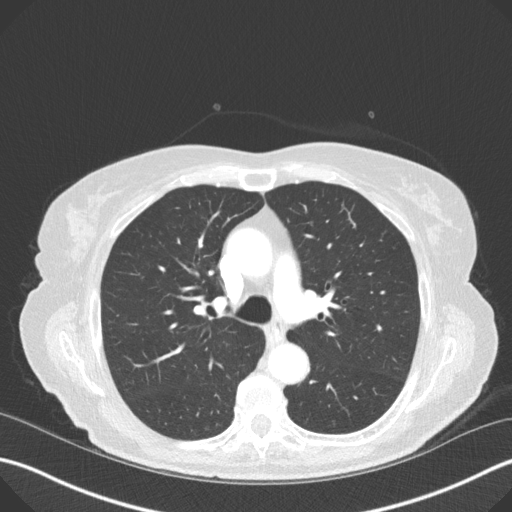
[im 109/155  mediastinal]
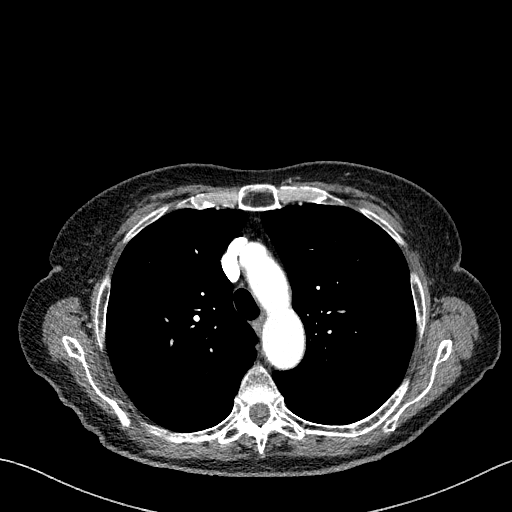
[im 109/155  lung]
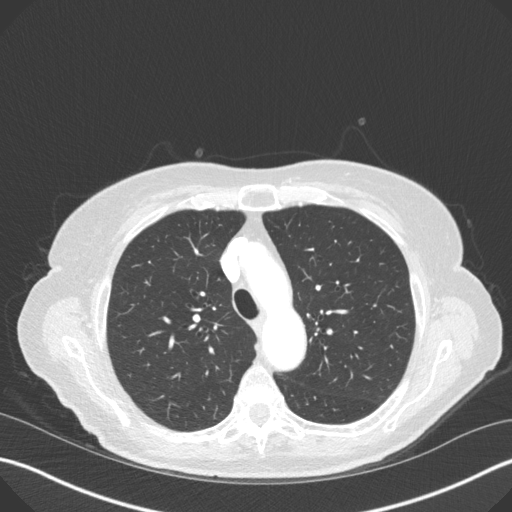
[im 120/155  lung]
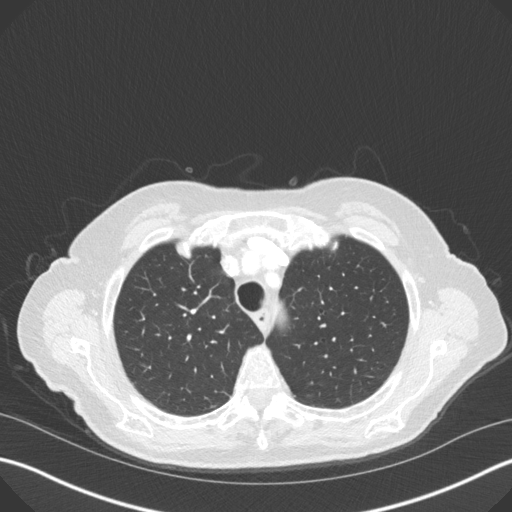
[im 132/155  lung]
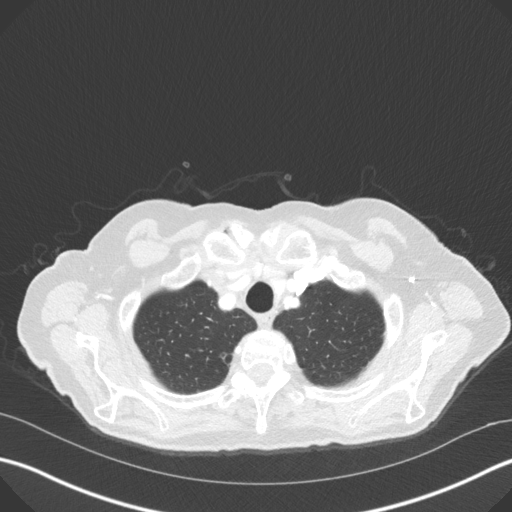
[im 143/155  lung]
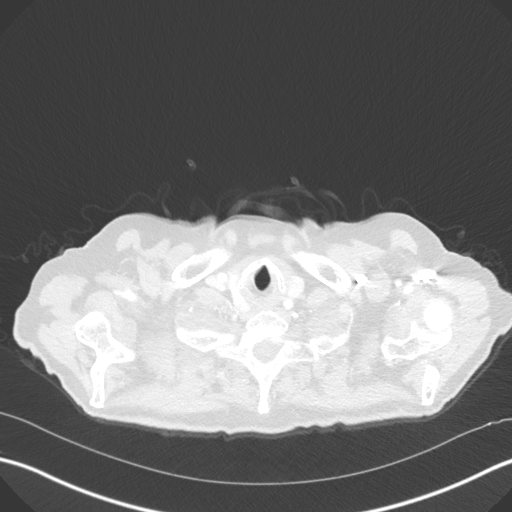

[Series 6: coronal · coronal · 0.60mm/px · 3 of 161 slices shown]
[im 33/161  lung]
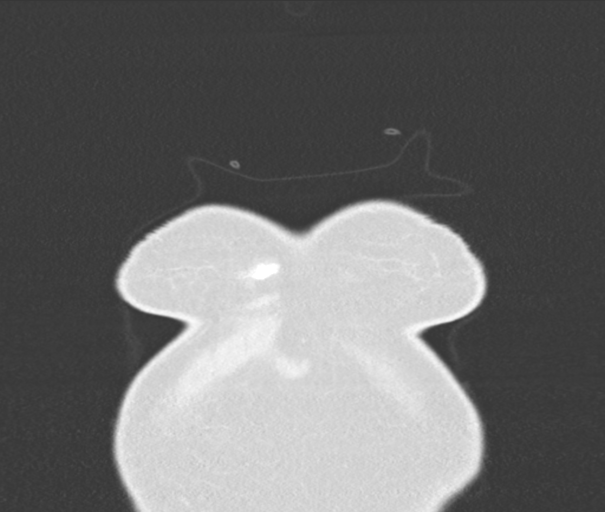
[im 65/161  lung]
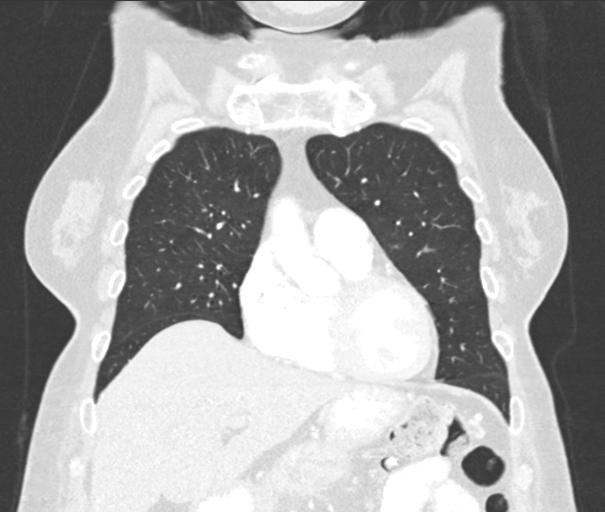
[im 97/161  lung]
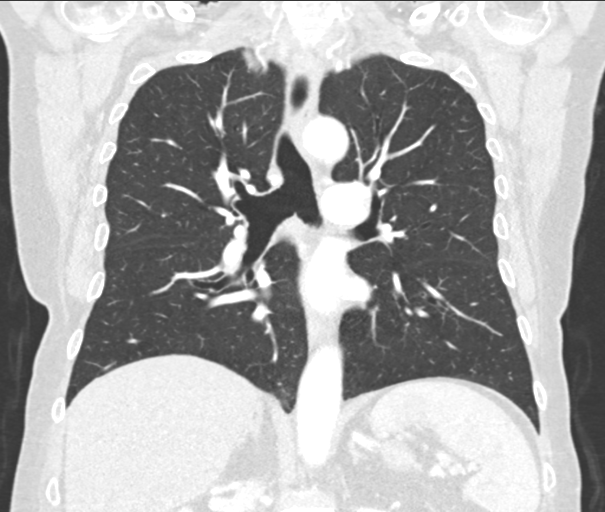

[15 of 36 positions shown; findings below may reference images not displayed]

FINDINGS: Cardiovascular: Heart size is within normal limits. No pericardial
effusion. Aortic atherosclerosis. Coronary artery calcifications.

Mediastinum/Nodes: No enlarged mediastinal, hilar, or axillary lymph
nodes. Thyroid gland, trachea, and esophagus demonstrate no
significant findings.

Lungs/Pleura: No pleural effusion identified. No airspace
consolidation, atelectasis or pneumothorax. Bilateral calcified
granulomas noted. No suspicious lung nodules to suggest pulmonary
metastasis.

Upper Abdomen: Limited imaging through the upper abdomen is again
shows a mass within the head of pancreas concerning for pancreatic
neoplasm. No acute findings identified.

Musculoskeletal: The multiple compression deformities are noted
within the thoracic spine. This is most advanced at T10 and T12. No
acute or suspicious osseous findings.
IMPRESSION: 1. No acute cardiopulmonary abnormalities. No findings to suggest
metastatic disease to the chest.
2. Aortic atherosclerosis. Coronary artery calcifications noted.

Aortic Atherosclerosis ([4S]-[4S]).

## 2020-01-29 IMAGING — CT CT PELVIS W/ CM
2 of 4 series · 16 of 46 positions shown, 18 images · IV contrast (APPLIED)
Comparison: CT abdomen [DATE] CT pelvis from [DATE]

CLINICAL DATA: Staging workup pancreatic adenocarcinoma

EXAM:
CT PELVIS WITH CONTRAST
TECHNIQUE: Multidetector CT imaging of the pelvis was performed using the
standard protocol following the bolus administration of intravenous
contrast.
CONTRAST:  100mL OMNIPAQUE IOHEXOL 300 MG/ML  SOLN

[Series 6: pelvis thin · axial · 0.78mm/px · z∈[+1037,+1290]mm · 13 of 461 slices shown, 15 images]
[im 20/461  soft-tissue]
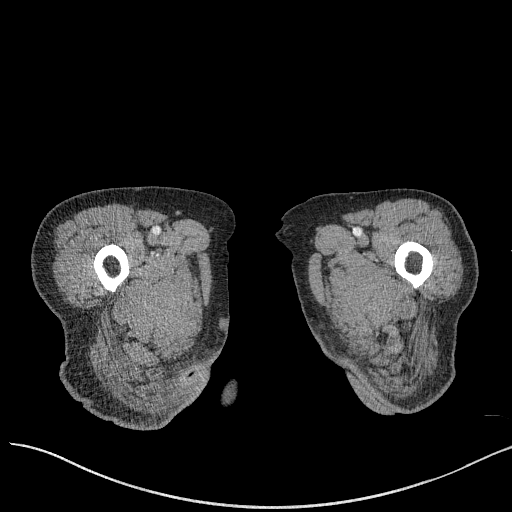
[im 20/461  bone]
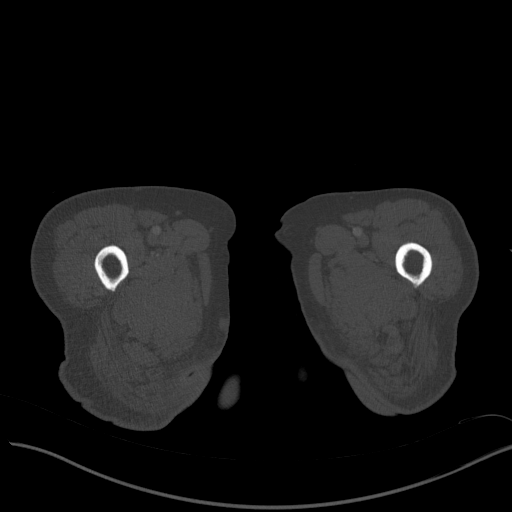
[im 58/461  soft-tissue]
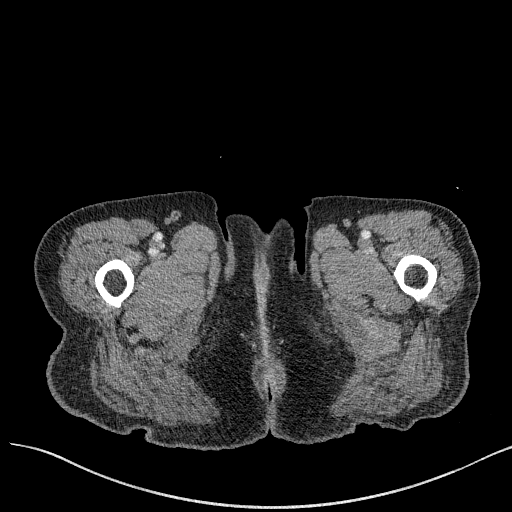
[im 96/461  soft-tissue]
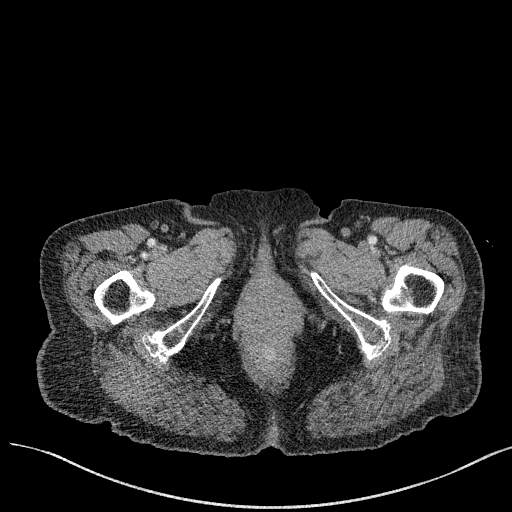
[im 135/461  soft-tissue]
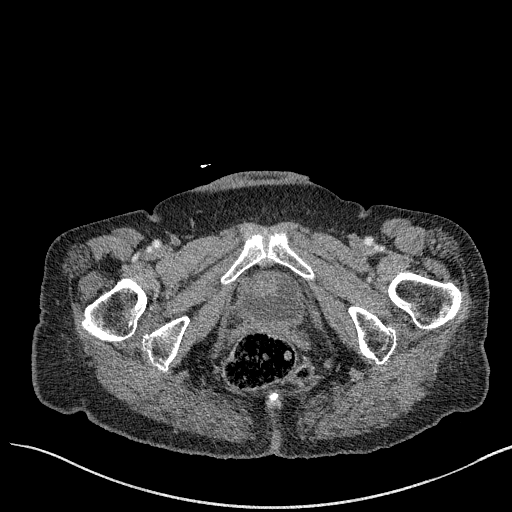
[im 154/461  soft-tissue]
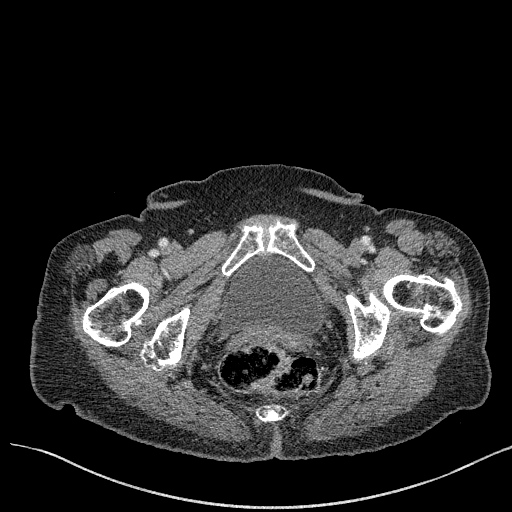
[im 192/461  soft-tissue]
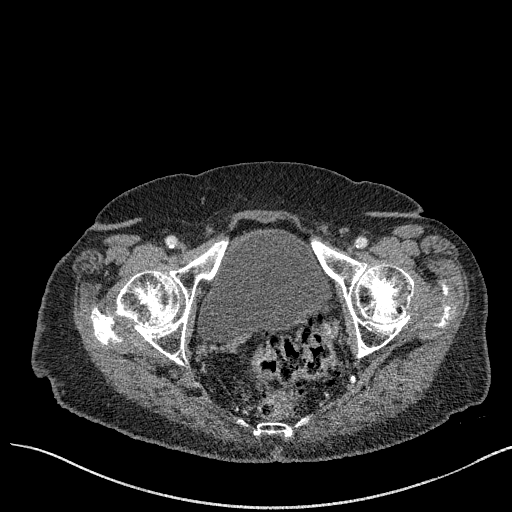
[im 231/461  soft-tissue]
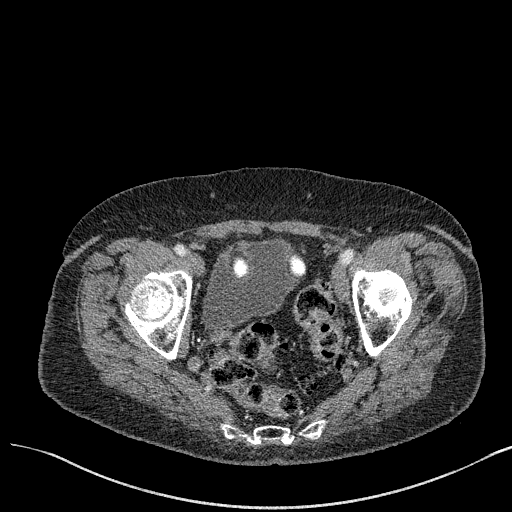
[im 269/461  soft-tissue]
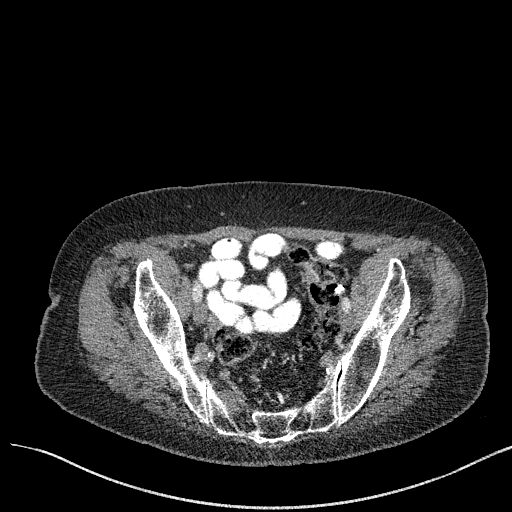
[im 307/461  soft-tissue]
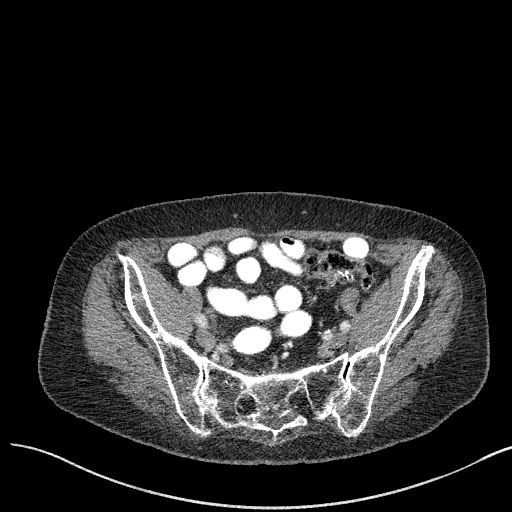
[im 307/461  bone]
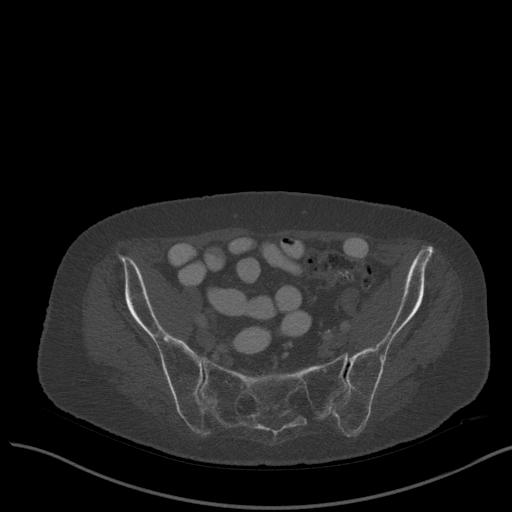
[im 326/461  soft-tissue]
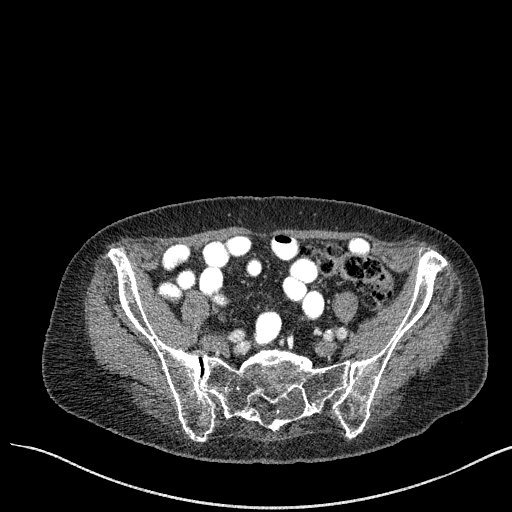
[im 365/461  soft-tissue]
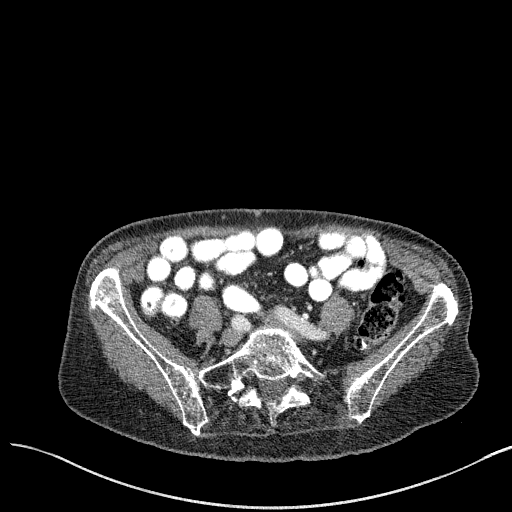
[im 403/461  soft-tissue]
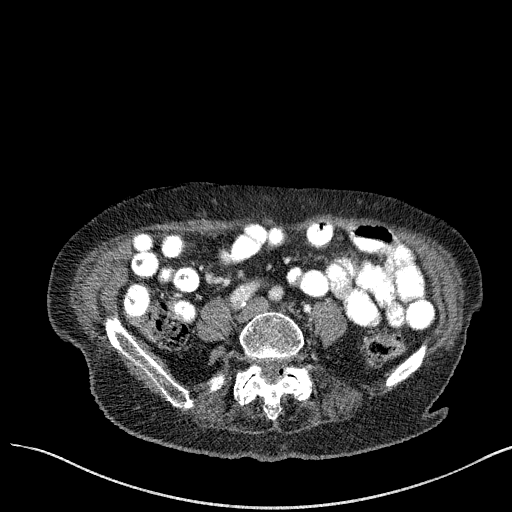
[im 441/461  soft-tissue]
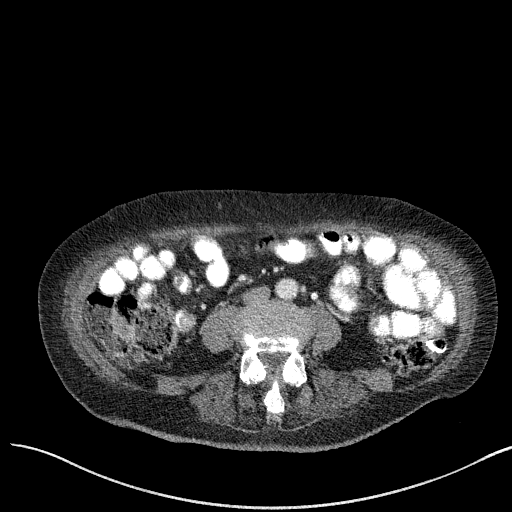

[Series 9: coronal st · coronal · 0.54mm/px · 3 of 190 slices shown]
[im 64/190  soft-tissue]
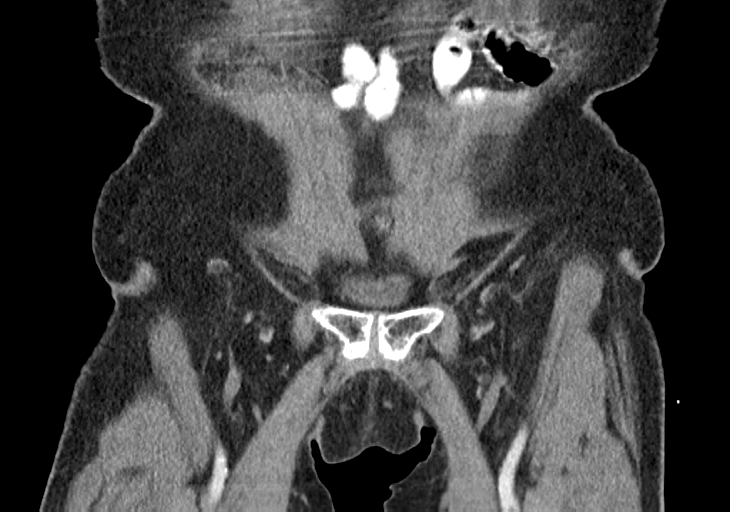
[im 85/190  soft-tissue]
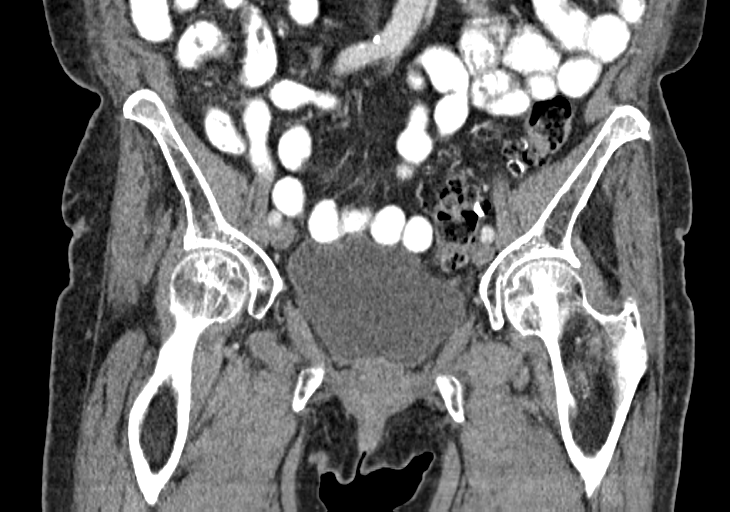
[im 106/190  soft-tissue]
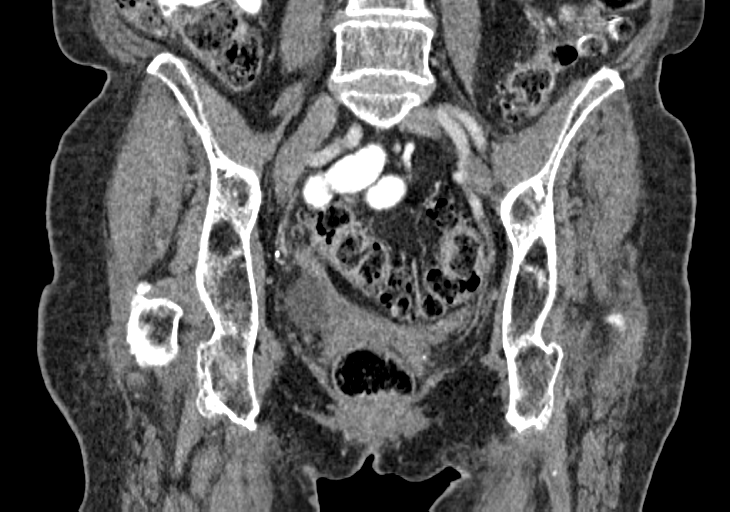

[16 of 46 positions shown; findings below may reference images not displayed]

FINDINGS: Urinary Tract:  Mild pelvic floor laxity with small hydrocele.

Bowel:  Sigmoid colon diverticulosis.

Vascular/Lymphatic: Aortoiliac atherosclerotic vascular disease. No
pathologic pelvic adenopathy.

Reproductive:  Uterus absent.  Adnexa unremarkable.

Other:  No supplemental non-categorized findings.

Musculoskeletal: Transitional S1 vertebra. Degenerative grade 1
anterolisthesis of L5 on S1. Left eccentric Tarlov cyst at the S2
level.
IMPRESSION: 1. No findings of metastatic disease to the pelvis.
2. Sigmoid colon diverticulosis.
3. Mild pelvic floor laxity with small hydrocele.
4. Aortic atherosclerosis.

Aortic Atherosclerosis ([GD]-[GD]).

## 2020-01-29 MED ORDER — IOHEXOL 300 MG/ML  SOLN
100.0000 mL | Freq: Once | INTRAMUSCULAR | Status: AC | PRN
  Administered 2020-01-29: 100 mL via INTRAVENOUS

## 2020-01-29 NOTE — Progress Notes (Signed)
START ON PATHWAY REGIMEN - Pancreatic Adenocarcinoma     A cycle is every 14 days:     Oxaliplatin      Leucovorin      Irinotecan      Fluorouracil   **Always confirm dose/schedule in your pharmacy ordering system**  Patient Characteristics: Preoperative (Clinical Staging), Borderline Resectable, PS = 0,1, BRCA1/2 and PALB2 Mutation Absent/Unknown Therapeutic Status: Preoperative (Clinical Staging) AJCC T Category: Staged < 8th Ed. AJCC N Category: Staged < 8th Ed. Resectability Status: Borderline Resectable AJCC M Category: Staged < 8th Ed. AJCC 8 Stage Grouping: Staged < 8th Ed. ECOG Performance Status: 1 BRCA1/2 Mutation Status: Awaiting Test Results PALB2 Mutation Status: Awaiting Test Results Intent of Therapy: Curative Intent, Discussed with Patient 

## 2020-01-29 NOTE — Progress Notes (Signed)
Met with patient and her husband Nancy Hawkins today at their initial medical oncology consult with Ned Card NP and Dr. Julieanne Manson.  I explained my role as nurse navigator and she was given my card with my direct contact information.  She verbalized an understanding of the plan to have a port a cath placed by Dr. [name] and potentially start treatment the week of 02/10/2020 with Folfirinox.  She and her husband were shown a model of a port a cath and I explained how it is used in her treatment.  She verbalized an understanding.  I escorted them both to scheduling to get her chemo education class scheduled as well as her follow up with Dr. Benay Spice.

## 2020-02-03 ENCOUNTER — Telehealth: Payer: Self-pay | Admitting: Nurse Practitioner

## 2020-02-03 ENCOUNTER — Other Ambulatory Visit: Payer: Self-pay

## 2020-02-03 ENCOUNTER — Inpatient Hospital Stay: Payer: Medicare Other

## 2020-02-03 DIAGNOSIS — C250 Malignant neoplasm of head of pancreas: Secondary | ICD-10-CM

## 2020-02-03 DIAGNOSIS — Z95828 Presence of other vascular implants and grafts: Secondary | ICD-10-CM

## 2020-02-03 MED ORDER — PROCHLORPERAZINE MALEATE 10 MG PO TABS: 10.0000 mg | ORAL_TABLET | Freq: Four times a day (QID) | ORAL | 0 refills | Status: DC | PRN

## 2020-02-03 MED ORDER — ONDANSETRON HCL 8 MG PO TABS: 8.0000 mg | ORAL_TABLET | Freq: Three times a day (TID) | ORAL | 0 refills | Status: DC | PRN

## 2020-02-03 MED ORDER — LIDOCAINE-PRILOCAINE 2.5-2.5 % EX CREA
1.0000 | TOPICAL_CREAM | CUTANEOUS | 0 refills | Status: DC | PRN
Start: 2020-02-03 — End: 2020-08-17

## 2020-02-03 NOTE — Telephone Encounter (Signed)
Scheduled appointments per 12/21 los. Called patient, no answer. Left message for patient with appointments dates and times. Will have updated calendar printed for patient when they come in today for this education class.

## 2020-02-03 NOTE — Progress Notes (Signed)
Pharmacist Chemotherapy Monitoring - Initial Assessment    Anticipated start date: 02/10/2020   Regimen:  . Are orders appropriate based on the patient's diagnosis, regimen, and cycle? Yes  . - deleted irinotecan from treatment plan for C1 per MD/NP note and plans to add irinotecan on C2 or C3 pending patient tolerance. Also changed leucovorin to be in dextrose and to be administered with oxaliplatin for C1.  . Does the plan date match the patient's scheduled date? Yes . Is the sequencing of drugs appropriate? Yes . Are the premedications appropriate for the patient's regimen? Yes  . Prior Authorization for treatment is: Approved o If applicable, is the correct biosimilar selected based on the patient's insurance? not applicable  Organ Function and Labs: Marland Kitchen Are dose adjustments needed based on the patient's renal function, hepatic function, or hematologic function? No . Are appropriate labs ordered prior to the start of patient's treatment? Yes . Other organ system assessment, if indicated: N/A . The following baseline labs, if indicated, have been ordered: N/A  Dose Assessment: . Are the drug doses appropriate? Yes . Are the following correct: o Drug concentrations Yes o IV fluid compatible with drug Yes o Administration routes Yes o Timing of therapy Yes . If applicable, does the patient have documented access for treatment and/or plans for port-a-cath placement? no . If applicable, have lifetime cumulative doses been properly documented and assessed? no Lifetime Dose Tracking  No doses have been documented on this patient for the following tracked chemicals: Doxorubicin, Epirubicin, Idarubicin, Daunorubicin, Mitoxantrone, Bleomycin, Oxaliplatin, Carboplatin, Liposomal Doxorubicin  o   Toxicity Monitoring/Prevention: . The patient has the following take home antiemetics prescribed: Ondansetron and Prochlorperazine . The patient has the following take home medications prescribed: CSFs  for > 20% risk febrile neutropenia not ordered. FOLFIRINOX is high risk for FN but irinotecan being held on C1. Follow up to assess if G-CSF addition necessary.  . Medication allergies and previous infusion related reactions, if applicable, have been reviewed and addressed. Yes The patient's current medication list has been assessed for drug-drug interactions with their chemotherapy regimen. Diltiazem may increase toxicity of active metabolites of irinotecan - monitoring for increased adverse effects (neutropenia, diarrhea) recommended.   Order Review: . Are the treatment plan orders signed? No . Is the patient scheduled to see a provider prior to their treatment? Yes  I verify that I have reviewed each item in the above checklist and answered each question accordingly.  Fara Olden, PharmD, BCPS PGY-2 Hematology/Oncology Pharmacy Resident  02/03/2020 3:19 PM

## 2020-02-04 ENCOUNTER — Ambulatory Visit: Payer: Self-pay | Admitting: Surgery

## 2020-02-04 NOTE — Progress Notes (Addendum)
COVID Vaccine Completed:  No Date COVID Vaccine completed: COVID vaccine manufacturer: [employer]   PCP - Synetta Fail, MD Cardiologist - Salome Spotted, MD  Chest x-ray - 01-20-20 in Epic EKG - 11-18-19 in Epic Stress Test -  ECHO -  Cardiac Cath -  Pacemaker/ICD device last checked:  Sleep Study - N/A CPAP -   Fasting Blood Sugar - 121 to 231 (fluctuates due to pancreatitis) Checks Blood Sugar - 3 times a day  Blood Thinner Instructions: Aspirin Instructions: ASA 81 mg Last Dose:  Anesthesia review:  Hx of Afib (last episode 15 years ago).  PCP now manages with Cardizem.  HTN, DM  Patient denies shortness of breath, fever, cough and chest pain at PAT appointment.  Patient able to climb a flight of stairs and perform housework and ADLs independently.  Patient verbalized understanding of instructions that were given to them at the PAT appointment. Patient was also instructed that they will need to review over the PAT instructions again at home before surgery.

## 2020-02-04 NOTE — Progress Notes (Signed)
Spoke with patient regarding her upcoming appointments.  She states she is getting her port placed on 02/12/2020 largely because she had a conflict on 12/29 a previously long time scheduled appointment with her endocrinologist. I explained to her that her current appointments on 02/10/2020 will have to be moved to Thursday 02/13/2020 with pump d/c on Saturday 02/15/2020.  She verbalized an understanding.  I have sent a message to scheduler Shirlee More to move the existing appointments on 1/3 to 1/6.

## 2020-02-05 ENCOUNTER — Encounter (HOSPITAL_COMMUNITY): Payer: Self-pay | Admitting: Physician Assistant

## 2020-02-05 ENCOUNTER — Other Ambulatory Visit: Payer: Self-pay

## 2020-02-05 ENCOUNTER — Encounter (HOSPITAL_COMMUNITY): Payer: Self-pay | Admitting: Surgery

## 2020-02-05 NOTE — Progress Notes (Signed)
The proposed treatment discussed in conference is for discussion purposes only and is not a binding recommendation.  The patients have not been physically examined, or presented with their treatment options.  Therefore, final treatment plans cannot be decided.   

## 2020-02-05 NOTE — Progress Notes (Signed)
Anesthesia Chart Review   Case: [id number] Date/Time: 02/12/20 1015   Procedure: INSERTION PORT-A-CATH (N/A ) - ANESTHESIA LMA   Anesthesia type: General   Pre-op diagnosis: PANCREATIC CANCER   Location: [facility] ROOM 02 / [facility]   Surgeons: Fritzi Mandes, MD      DISCUSSION:  Per previous anesthesia chart review 01/17/2020, "History of paroxysmal afib, not on anticoagulation due to maintenance of sinus rhythm. Last seen by cardiologist at [facility] 11/01/18. Per note, "1. Paroxysmal atrial fibrillation, remotely noted, has had no symptomatic recurrence and had been maintained with rhythm control with propafenone for quite some time. She is presently on diltiazem alone, and has had no symptomatic recurrence of atrial fibrillation. Today she is in sinus rhythm with APCs. Will continue same. Have asked her to call if she does experience any recurrence symptoms, in which case we may need to repeat a monitor to assure that she is not having PAF. If so would need to consider initiation of anticoagulation in light of her age/chads Vasc score 2. Hypertension, well controlled 3. Preserved LV systolic function with mild MR and TR on echo 2012 4. No evidence of ischemic heart disease on Cardiolite study 2008 5. Gastroesophageal reflux disease."  Uncontrolled IDDMII, last A1c 11. Per PCP notes 02/04/2020 she is being seen by endocrinologist 02/06/2020.   Will need DOS labs and eval (SDW).   EKG 11/18/19: Sinus rhythm. Rate 82. Low voltage, precordial leads. Anteroseptal infarct, old. Prolonged QT interval (QTc 547). VS: Ht 5\' 4"  (1.626 m)   Wt 61.5 kg   LMP  (LMP Unknown)   BMI 23.26 kg/m   PROVIDERS: ., MD is PCP   Malka So, MD is Cardiologist  LABS: DOS (all labs ordered are listed, but only abnormal results are displayed)  Labs Reviewed - No data to display   IMAGES:   EKG:   CV:  Past Medical History:  Diagnosis Date  . Diabetes mellitus without complication  (HCC)    type 2  . Dysrhythmia    a-fib 15 yrs ago per patient on 01/17/20  . GERD (gastroesophageal reflux disease)   . Hypercholesteremia   . Hypertension   . Pancreatic cancer (HCC)   . Pancreatic mass   . Pancreatitis    Hx  . Seasonal allergies     Past Surgical History:  Procedure Laterality Date  . ABDOMINAL HYSTERECTOMY    . BIOPSY  11/20/2019   Procedure: BIOPSY;  Surgeon: 11/22/2019 Meridee Score., MD;  Location: [facility] ENDOSCOPY;  Service: Gastroenterology;;  . CHOLECYSTECTOMY    . COLONOSCOPY WITH PROPOFOL N/A 11/20/2019   Procedure: COLONOSCOPY WITH PROPOFOL;  Surgeon: 11/22/2019 Meridee Score., MD;  Location[facility] ENDOSCOPY;  Service: Gastroenterology;  Laterality: N/A;  . ENDOSCOPIC MUCOSAL RESECTION  11/20/2019   Procedure: ENDOSCOPIC MUCOSAL RESECTION;  Surgeon: 11/22/2019 Meridee Score., MD;  Location: [facility] ENDOSCOPY;  Service: Gastroenterology;;  . ESOPHAGOGASTRODUODENOSCOPY (EGD) WITH PROPOFOL N/A 11/20/2019   Procedure: ESOPHAGOGASTRODUODENOSCOPY (EGD) WITH PROPOFOL;  Surgeon: 11/22/2019., MD;  Location: [facility] ENDOSCOPY;  Service: Gastroenterology;  Laterality: N/A;  . ESOPHAGOGASTRODUODENOSCOPY (EGD) WITH PROPOFOL N/A 01/20/2020   Procedure: ESOPHAGOGASTRODUODENOSCOPY (EGD) WITH PROPOFOL;  Surgeon: 01/22/2020 Meridee Score., MD;  Location: [facility] ENDOSCOPY;  Service: Gastroenterology;  Laterality: N/A;  . EUS N/A 01/20/2020   Procedure: UPPER ENDOSCOPIC ULTRASOUND (EUS) RADIAL;  Surgeon: 01/22/2020 Meridee Score., MD;  Location: [facility] ENDOSCOPY;  Service: Gastroenterology;  Laterality: N/A;  . FINE NEEDLE ASPIRATION  11/20/2019   Procedure: FINE NEEDLE ASPIRATION (  FNA) LINEAR;  Surgeon: Mansouraty, Netty Starring., MD;  Location: [facility] ENDOSCOPY;  Service: Gastroenterology;;  . FINE NEEDLE ASPIRATION BIOPSY  01/20/2020   Procedure: FINE NEEDLE ASPIRATION BIOPSY;  Surgeon: Meridee Score Netty Starring., MD;  Location: [facility] ENDOSCOPY;  Service: Gastroenterology;;  . HEMOSTASIS CLIP PLACEMENT   11/20/2019   Procedure: HEMOSTASIS CLIP PLACEMENT;  Surgeon: Lemar Lofty., MD;  Location: [facility] ENDOSCOPY;  Service: Gastroenterology;;  . NASAL SINUS SURGERY    . POLYPECTOMY  11/20/2019   Procedure: POLYPECTOMY;  Surgeon: Mansouraty, Netty Starring., MD;  Location: [facility] ENDOSCOPY;  Service: Gastroenterology;;  . Sunnie Nielsen LIFTING INJECTION  11/20/2019   Procedure: SUBMUCOSAL LIFTING INJECTION;  Surgeon: Lemar Lofty., MD;  Location: [facility] ENDOSCOPY;  Service: Gastroenterology;;  . UPPER ESOPHAGEAL ENDOSCOPIC ULTRASOUND (EUS) N/A 11/20/2019   Procedure: UPPER ESOPHAGEAL ENDOSCOPIC ULTRASOUND (EUS);  Surgeon: Lemar Lofty., MD;  Location: [facility] ENDOSCOPY;  Service: Gastroenterology;  Laterality: N/A;    MEDICATIONS: No current facility-administered medications for this encounter.   Marland Kitchen aspirin EC 81 MG tablet  . diltiazem (TIAZAC) 120 MG 24 hr capsule  . Ensure Max Protein (ENSURE MAX PROTEIN) LIQD  . famotidine (PEPCID) 40 MG tablet  . fexofenadine (ALLEGRA) 180 MG tablet  . guaiFENesin (MUCINEX) 600 MG 12 hr tablet  . insulin aspart (NOVOLOG) 100 UNIT/ML FlexPen  . Insulin Glargine (BASAGLAR KWIKPEN) 100 UNIT/ML  . Multiple Vitamins-Minerals (MULTIVITAMIN WITH MINERALS) tablet  . Multiple Vitamins-Minerals (OCUVITE EYE HEALTH FORMULA PO)  . Probiotic Product (PROBIOTIC DAILY) CAPS  . telmisartan (MICARDIS) 40 MG tablet  . traMADol (ULTRAM) 50 MG tablet  . triamcinolone (NASACORT) 55 MCG/ACT AERO nasal inhaler  . lidocaine-prilocaine (EMLA) cream  . ondansetron (ZOFRAN) 8 MG tablet  . prochlorperazine (COMPAZINE) 10 MG tablet     Jodell Cipro, PA-C [facility] [phone]

## 2020-02-06 ENCOUNTER — Other Ambulatory Visit: Payer: Self-pay

## 2020-02-06 DIAGNOSIS — D509 Iron deficiency anemia, unspecified: Secondary | ICD-10-CM

## 2020-02-09 ENCOUNTER — Other Ambulatory Visit: Payer: Self-pay | Admitting: Oncology

## 2020-02-10 ENCOUNTER — Other Ambulatory Visit: Payer: Medicare Other

## 2020-02-10 ENCOUNTER — Inpatient Hospital Stay: Payer: Medicare Other

## 2020-02-10 ENCOUNTER — Ambulatory Visit: Payer: Medicare Other | Admitting: Oncology

## 2020-02-10 ENCOUNTER — Other Ambulatory Visit (HOSPITAL_COMMUNITY)
Admission: RE | Admit: 2020-02-10 | Discharge: 2020-02-10 | Disposition: A | Discharge status: Home / Self Care | Payer: Medicare Other | Source: Ambulatory Visit | Attending: Surgery | Admitting: Surgery

## 2020-02-10 DIAGNOSIS — U071 COVID-19: Secondary | ICD-10-CM | POA: Insufficient documentation

## 2020-02-10 DIAGNOSIS — Z01818 Encounter for other preprocedural examination: Secondary | ICD-10-CM | POA: Diagnosis present

## 2020-02-11 ENCOUNTER — Encounter: Payer: Self-pay | Admitting: Nurse Practitioner

## 2020-02-11 LAB — SARS CORONAVIRUS 2 (TAT 6-24 HRS): SARS Coronavirus 2: POSITIVE — AB

## 2020-02-11 NOTE — Progress Notes (Signed)
Sabrina at Dr. Eliot Ford office aware of patient's covid test result.

## 2020-02-12 ENCOUNTER — Ambulatory Visit (HOSPITAL_COMMUNITY): Admission: RE | Admit: 2020-02-12 | Payer: Medicare Other | Source: Home / Self Care | Admitting: Surgery

## 2020-02-12 ENCOUNTER — Telehealth: Payer: Self-pay | Admitting: *Deleted

## 2020-02-12 HISTORY — DX: Malignant neoplasm of pancreas, unspecified: C25.9

## 2020-02-12 SURGERY — INSERTION, TUNNELED CENTRAL VENOUS DEVICE, WITH PORT
Anesthesia: General

## 2020-02-12 NOTE — Telephone Encounter (Signed)
Left message with infusion center with patient information and positive covid test to determine if she qualifies for the antibody treatment. Provided patient information and contact #.

## 2020-02-12 NOTE — Progress Notes (Signed)
Spoke to patient s/p testing positive for Covid.  I explained to her the minimum to wait for port placement would be 10 days per ID.  She is pretty ill with cough and congestion.  She is desiring to wait the full 21 days to get well prior to port placement and treatment.  I have sent Dr. Sophronia Simas with [facility] a message to this affect and made Dr. Truett Perna aware.

## 2020-02-13 ENCOUNTER — Telehealth (HOSPITAL_COMMUNITY): Payer: Self-pay | Admitting: Family

## 2020-02-13 ENCOUNTER — Inpatient Hospital Stay: Payer: Medicare Other | Admitting: Nurse Practitioner

## 2020-02-13 ENCOUNTER — Inpatient Hospital Stay: Payer: Medicare Other

## 2020-02-13 DIAGNOSIS — U071 COVID-19: Secondary | ICD-10-CM

## 2020-02-13 NOTE — Telephone Encounter (Signed)
Called to discuss with Bryson Corona about Covid symptoms and potential candidacy for the use of sotrovimab, a combination monoclonal antibody infusion for those with mild to moderate Covid symptoms and at a high risk of hospitalization.     Pt appears to be qualified for both MAB and antiviral therapyat the infusion center due to co-morbid conditions and/or a member of an at-risk group, however unable to reach patient. Called cell phone number and was hung up on twice. Called home phone and VM left.   Boston Cookson,NP  .

## 2020-02-14 ENCOUNTER — Telehealth: Payer: Self-pay | Admitting: Oncology

## 2020-02-14 NOTE — Telephone Encounter (Signed)
Scheduled appt per 1/7 sch msg - unable to reach pt . Left message for patient with appt date and time

## 2020-02-15 ENCOUNTER — Inpatient Hospital Stay: Payer: Medicare Other

## 2020-02-17 ENCOUNTER — Encounter: Payer: Medicare Other | Admitting: Nutrition

## 2020-02-26 ENCOUNTER — Telehealth: Payer: Self-pay | Admitting: Gastroenterology

## 2020-02-26 NOTE — Telephone Encounter (Signed)
See note from Dr Rush Landmark.    "Although Dr. Hilarie Fredrickson will be her primary"  Please route accordingly.

## 2020-02-26 NOTE — Telephone Encounter (Signed)
Pt is requesting a call back from a nurse to discuss her stomach pain, pt states she has not been able to eat bc of this and she is a diabetic, pt would like a call back with some advice on what she can do.

## 2020-02-27 NOTE — Telephone Encounter (Signed)
Pt states her stomach had been hurting for the past 5 days. She states she was having problems eating due to her stomach discomfort. She reports she stopped the iron pill she was taking and now her stomach discomfort is better and she has been able to eat. Dr. Hilarie Fredrickson notified. Pt is scheduled to have a port placed early Feb and will be following up with the cancer center.

## 2020-02-27 NOTE — Telephone Encounter (Signed)
Called patient back and gave Dr. Vena Rua recommendation to discuss with Dr. Benay Spice

## 2020-02-27 NOTE — Telephone Encounter (Signed)
Thank you, it is most probable that oral iron was causing her stomach upset which is now resolved after stopping oral iron She can discuss whether IV iron is needed with Dr. Benay Spice when she sees him next

## 2020-02-28 ENCOUNTER — Encounter: Payer: Self-pay | Admitting: Oncology

## 2020-03-02 ENCOUNTER — Telehealth: Payer: Self-pay | Admitting: Gastroenterology

## 2020-03-02 ENCOUNTER — Telehealth: Payer: Self-pay | Admitting: *Deleted

## 2020-03-02 DIAGNOSIS — C259 Malignant neoplasm of pancreas, unspecified: Secondary | ICD-10-CM

## 2020-03-02 NOTE — Telephone Encounter (Signed)
Called Nancy [facility]. Dr Benay Spice wants her to stop iron and use imodium for diarrhea. She states she has stopped the iron and is using imodium which has helped with diarrhea. Dr Benay Spice wants to know if she has any jaundice, pt denies. Instructed to call GI.

## 2020-03-02 NOTE — Telephone Encounter (Signed)
Pt called stating that she has been having pain in he same area where her pancreatitis was so she was to know what pain medication she can take. She stated that she was taken Advil and it helped with pain. However,Dr. Benay Spice told her not to take advil.

## 2020-03-02 NOTE — Telephone Encounter (Signed)
The patient was diagnosed with COVID on 02/10/20 and coughed a significant amount. She developed loose stools, and right side back pain. She states that the pain is getting better.  She just wants to be sure that she does not need to worry about pancreatitis. She has a history of pancreatitis and  had some similar symptoms with the last episode.  She has a call out to her PCP as well.  She believes it may be muscular from coughing but wants to be sure.  Please advise

## 2020-03-03 ENCOUNTER — Other Ambulatory Visit: Payer: Self-pay

## 2020-03-03 ENCOUNTER — Encounter: Payer: Self-pay | Admitting: Emergency Medicine

## 2020-03-03 ENCOUNTER — Ambulatory Visit
Admission: EM | Admit: 2020-03-03 | Discharge: 2020-03-03 | Disposition: A | Discharge status: Home / Self Care | Payer: Medicare Other | Attending: Emergency Medicine | Admitting: Emergency Medicine

## 2020-03-03 DIAGNOSIS — Z8507 Personal history of malignant neoplasm of pancreas: Secondary | ICD-10-CM | POA: Diagnosis not present

## 2020-03-03 DIAGNOSIS — R112 Nausea with vomiting, unspecified: Secondary | ICD-10-CM

## 2020-03-03 DIAGNOSIS — R1084 Generalized abdominal pain: Secondary | ICD-10-CM

## 2020-03-03 DIAGNOSIS — R195 Other fecal abnormalities: Secondary | ICD-10-CM | POA: Diagnosis not present

## 2020-03-03 LAB — POCT FASTING CBG KUC MANUAL ENTRY: POCT Glucose (KUC): 108 mg/dL — AB (ref 70–99)

## 2020-03-03 MED ORDER — ONDANSETRON 4 MG PO TBDP: 4.0000 mg | ORAL_TABLET | Freq: Three times a day (TID) | ORAL | 0 refills | Status: DC | PRN

## 2020-03-03 NOTE — Telephone Encounter (Signed)
Suspect less likely but she remains at risk of pancreatitis in setting of known pancreatic cancer. If she is still having concerns, please have her come in for CBC/CMP/Amylase/Lipase. Thanks. GM

## 2020-03-03 NOTE — ED Triage Notes (Signed)
Pt states that last week she started vomiting and having loose stools. Pt states that she does have bloating associated with the vomiting/diarrhea Pt states that she has tried OTC counter medication and she cant keep nothing down so its hard to take medications. Pt states if the pain could subside then she could hold food down.

## 2020-03-03 NOTE — ED Provider Notes (Signed)
[facility]    CSN: [id number] Arrival date & time: 03/03/20  1626      History   Chief Complaint Chief Complaint  Patient presents with  . Diarrhea  . Emesis    HPI Nancy Hawkins is a 80 y.o. female  With extensive medical history as below presenting for weeklong course of nausea, vomiting, loose stools.  States stools are paler than normal, "claylike ".  Does have some bloating.  Able to tolerate foods and liquids, though in very small amounts.  Followed routinely by oncology for pancreatic cancer.  Attempted to reach out to both oncology and PCP, though were unable to see her in office due to recent Covid infection (3 weeks ago), so they recommended her going to urgent care for evaluation.  No recent travel, chest pain, palpitations, shortness of breath.  Past Medical History:  Diagnosis Date  . Diabetes mellitus without complication ([place])    type 2  . Dysrhythmia    a-fib 15 yrs ago per patient on 01/17/20  . GERD (gastroesophageal reflux disease)   . Hypercholesteremia   . Hypertension   . Pancreatic cancer ([place])   . Pancreatic mass   . Pancreatitis    Hx  . Seasonal allergies     Patient Active Problem List   Diagnosis Date Noted  . Abdominal pain 03/05/2020  . Diabetes mellitus type 2, uncontrolled ([place]) 03/05/2020  . Protein malnutrition ([place]) 03/05/2020  . Transaminitis 03/05/2020  . Hyperbilirubinemia 03/05/2020  . Primary cancer of head of pancreas ([place]) 01/29/2020  . Nausea & vomiting   . Acute pancreatitis   . Abnormal CT scan, colon   . DKA (diabetic ketoacidosis) ([place]) 11/17/2019  . Pancreatic mass 11/17/2019    Past Surgical History:  Procedure Laterality Date  . ABDOMINAL HYSTERECTOMY    . BIOPSY  11/20/2019   Procedure: BIOPSY;  Surgeon: Rush Landmark Telford Nab., MD;  Location: [place];  Service: Gastroenterology;;  . CHOLECYSTECTOMY    . COLONOSCOPY WITH PROPOFOL N/A 11/20/2019   Procedure: COLONOSCOPY WITH PROPOFOL;   Surgeon: Rush Landmark Telford Nab., MD;  Location: [place];  Service: Gastroenterology;  Laterality: N/A;  . ENDOSCOPIC MUCOSAL RESECTION  11/20/2019   Procedure: ENDOSCOPIC MUCOSAL RESECTION;  Surgeon: Rush Landmark Telford Nab., MD;  Location: [place];  Service: Gastroenterology;;  . ESOPHAGOGASTRODUODENOSCOPY (EGD) WITH PROPOFOL N/A 11/20/2019   Procedure: ESOPHAGOGASTRODUODENOSCOPY (EGD) WITH PROPOFOL;  Surgeon: Irving Copas., MD;  Location: [place];  Service: Gastroenterology;  Laterality: N/A;  . ESOPHAGOGASTRODUODENOSCOPY (EGD) WITH PROPOFOL N/A 01/20/2020   Procedure: ESOPHAGOGASTRODUODENOSCOPY (EGD) WITH PROPOFOL;  Surgeon: Rush Landmark Telford Nab., MD;  Location: [facility];  Service: Gastroenterology;  Laterality: N/A;  . EUS N/A 01/20/2020   Procedure: UPPER ENDOSCOPIC ULTRASOUND (EUS) RADIAL;  Surgeon: Rush Landmark Telford Nab., MD;  Location: [place];  Service: Gastroenterology;  Laterality: N/A;  . FINE NEEDLE ASPIRATION  11/20/2019   Procedure: FINE NEEDLE ASPIRATION (FNA) LINEAR;  Surgeon: Irving Copas., MD;  Location: [facility];  Service: Gastroenterology;;  . FINE NEEDLE ASPIRATION BIOPSY  01/20/2020   Procedure: FINE NEEDLE ASPIRATION BIOPSY;  Surgeon: Rush Landmark Telford Nab., MD;  Location: [place];  Service: Gastroenterology;;  . HEMOSTASIS CLIP PLACEMENT  11/20/2019   Procedure: HEMOSTASIS CLIP PLACEMENT;  Surgeon: Irving Copas., MD;  Location: [place];  Service: Gastroenterology;;  . NASAL SINUS SURGERY    . POLYPECTOMY  11/20/2019   Procedure: POLYPECTOMY;  Surgeon: Mansouraty, Telford Nab., MD;  Location: [place];  Service: Gastroenterology;;  . Lia Foyer  LIFTING INJECTION  11/20/2019   Procedure: SUBMUCOSAL LIFTING INJECTION;  Surgeon: Rush Landmark Telford Nab., MD;  Location: [place];  Service: Gastroenterology;;  . UPPER ESOPHAGEAL ENDOSCOPIC ULTRASOUND (EUS) N/A 11/20/2019   Procedure: UPPER ESOPHAGEAL  ENDOSCOPIC ULTRASOUND (EUS);  Surgeon: Irving Copas., MD;  Location: [place];  Service: Gastroenterology;  Laterality: N/A;    OB History   No obstetric history on file.      Home Medications    Prior to Admission medications   Medication Sig Start Date End Date Taking? Authorizing Provider  ondansetron (ZOFRAN ODT) 4 MG disintegrating tablet Take 1 tablet (4 mg total) by mouth every 8 (eight) hours as needed for nausea or vomiting. Patient not taking: No sig reported 03/03/20  Yes Hall-Potvin, [place], PA-C  Cholecalciferol (VITAMIN D3) 25 MCG (1000 UT) CAPS Take 1,000 Units by mouth daily.    [provider]  diltiazem (TIAZAC) 120 MG 24 hr capsule Take 120 mg by mouth daily.    [provider]  Ensure Max Protein (ENSURE MAX PROTEIN) LIQD Take 330 mLs (11 oz total) by mouth 2 (two) times daily with a meal. Patient taking differently: Take 11 oz by mouth 2 (two) times daily between meals. 11/28/19   Geradine Girt, DO  famotidine (PEPCID) 40 MG tablet Take 40 mg by mouth daily as needed for heartburn or indigestion.    [provider]  fexofenadine (ALLEGRA) 180 MG tablet Take 180 mg by mouth in the morning.    [provider]  guaiFENesin (MUCINEX) 600 MG 12 hr tablet Take 600 mg by mouth every 12 (twelve) hours as needed for cough or to loosen phlegm.    [provider]  ibuprofen (ADVIL) 200 MG tablet Take 200-600 mg by mouth every 6 (six) hours as needed (for pain or sinus headaches).    [provider]  insulin aspart (NOVOLOG) 100 UNIT/ML FlexPen Inject 3 Units into the skin 3 (three) times daily with meals. Patient taking differently: Inject 3 Units into the skin 3 (three) times daily before meals. 11/28/19   Geradine Girt, DO  Insulin Glargine (BASAGLAR KWIKPEN) 100 UNIT/ML Inject 12 Units into the skin daily. Patient taking differently: Inject 12 Units into the skin at bedtime. 11/28/19   Geradine Girt, DO   lidocaine-prilocaine (EMLA) cream Apply 1 application topically as needed. Apply over port 45 minutes prior to having port accessed for chemotherapy treatment. 02/03/20   Ladell Pier, MD  Multiple Vitamins-Minerals (MULTIVITAMIN WITH MINERALS) tablet Take 1 tablet by mouth daily.    [provider]  Multiple Vitamins-Minerals (OCUVITE EYE HEALTH FORMULA PO) Take 1 tablet by mouth daily.    [provider]  ondansetron (ZOFRAN) 8 MG tablet Take 1 tablet (8 mg total) by mouth every 8 (eight) hours as needed for nausea or vomiting. 02/03/20   Ladell Pier, MD  Probiotic Product (PROBIOTIC DAILY) CAPS Take 1 capsule by mouth daily.    [provider]  prochlorperazine (COMPAZINE) 10 MG tablet Take 1 tablet (10 mg total) by mouth every 6 (six) hours as needed for nausea or vomiting. 02/03/20   Ladell Pier, MD  prochlorperazine (COMPAZINE) 10 MG tablet Take 1 tablet (10 mg total) by mouth every 8 (eight) hours as needed for nausea or vomiting. Patient not taking: Reported on 03/05/2020 03/05/20   Mansouraty, Telford Nab., MD  telmisartan (MICARDIS) 40 MG tablet Take 40 mg by mouth daily.    [provider]  traMADol (  ULTRAM) 50 MG tablet Take 1 tablet (50 mg total) by mouth every 8 (eight) hours as needed for moderate pain. 01/20/20   Mansouraty, Telford Nab., MD  triamcinolone (NASACORT) 55 MCG/ACT AERO nasal inhaler Place 2 sprays into the nose daily as needed (for congestion).    [provider]  vitamin C (ASCORBIC ACID) 500 MG tablet Take 500 mg by mouth daily.    [provider]    Family History Family History  Family history unknown: Yes    Social History Social History   Tobacco Use  . Smoking status: Never Smoker  . Smokeless tobacco: Never Used  Vaping Use  . Vaping Use: Never used  Substance Use Topics  . Alcohol use: Not Currently  . Drug use: Never     Allergies   Cephalosporins, Erythromycin, Fire ant, and  Penicillins   Review of Systems Review of Systems  Constitutional: Positive for fatigue. Negative for fever.  HENT: Negative for ear pain, sinus pain, sore throat and voice change.   Eyes: Negative for pain, redness and visual disturbance.  Respiratory: Negative for cough and shortness of breath.   Cardiovascular: Negative for chest pain and palpitations.  Gastrointestinal: Positive for abdominal distention, abdominal pain, diarrhea, nausea and vomiting. Negative for blood in stool.  Musculoskeletal: Negative for arthralgias and myalgias.  Skin: Negative for rash and wound.  Neurological: Negative for syncope and headaches.     Physical Exam Triage Vital Signs ED Triage Vitals  Enc Vitals Group     BP 03/03/20 1642 (!) 114/58     Pulse Rate 03/03/20 1642 85     Resp 03/03/20 1642 16     Temp 03/03/20 1642 97.8 F (36.6 C)     Temp Source 03/03/20 1642 Oral     SpO2 03/03/20 1642 98 %     Weight --      Height --      Head Circumference --      Peak Flow --      Pain Score 03/03/20 1640 8     Pain Loc --      Pain Edu? --      Excl. in [place]? --    No data found.  Updated Vital Signs BP (!) 114/58 (BP Location: Left Arm)   Pulse 85   Temp 97.8 F (36.6 C) (Oral)   Resp 16   LMP  (LMP Unknown)   SpO2 98%   Visual Acuity Right Eye Distance:   Left Eye Distance:  Bilateral Distance:    Right Eye Near:   Left Eye Near:    Bilateral Near:     Physical Exam Constitutional:      General: She is not in acute distress. HENT:     Head: Normocephalic and atraumatic.     Mouth/Throat:     Mouth: Mucous membranes are moist.     Pharynx: Oropharynx is clear.  Eyes:     General: No scleral icterus.    Conjunctiva/sclera: Conjunctivae normal.     Pupils: Pupils are equal, round, and reactive to light.  Cardiovascular:     Rate and Rhythm: Normal rate and regular rhythm.  Pulmonary:     Effort: Pulmonary effort is normal. No respiratory distress.     Breath sounds:  No wheezing.  Abdominal:     General: Abdomen is flat. Bowel sounds are normal.     Tenderness: There is abdominal tenderness.  Skin:    Coloration: Skin is not jaundiced or pale.  Neurological:     Mental Status: She is alert and oriented to person, place, and time.      UC Treatments / Results  Labs (all labs ordered are listed, but only abnormal results are displayed)   EKG   Radiology   Procedures Procedures (including critical care time)  Medications Ordered in UC Medications - No data to display  Initial Impression / Assessment and Plan / UC Course  I have reviewed the triage vital signs and the nursing notes.  Pertinent labs & imaging results that were available during my care of the patient were reviewed by me and considered in my medical decision making (see chart for details).     Patient afebrile, nontoxic in office today.  Patient does have history of pancreatic cancer.  Was largely asymptomatic until recently.  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  Will treat supportively as below, follow-up closely with oncology, gastroenterology, PCP.  Will obtain basic labs, patient go to ER for significant change in liver pancreatic functioning, electrolytes.  ER return precautions discussed, pt verbalized understanding and is agreeable to plan. Final Clinical Impressions(s) / UC Diagnoses   Final diagnoses:  Personal history of pancreatic cancer  Loose stools  Nausea and vomiting, intractability of vomiting not specified, unspecified vomiting type  Generalized abdominal pain     Discharge Instructions     Talk to PCP about (outpatient) palliative care referral/services. Talk with Oncologist about [facility].    ED Prescriptions    Medication Sig Dispense Auth. Provider   ondansetron (ZOFRAN ODT) 4 MG disintegrating tablet Take 1 tablet (4 mg total) by mouth every 8 (eight) hours as needed  for nausea or vomiting. Patient not taking:  No sig reported 21 tablet Hall-Potvin, [place], PA-C     PDMP not reviewed this encounter.   Hall-Potvin, [place], [place] 03/06/20 1549

## 2020-03-03 NOTE — Discharge Instructions (Addendum)
Talk to PCP about (outpatient) palliative care referral/services. Talk with Oncologist about [facility].

## 2020-03-04 LAB — COMPREHENSIVE METABOLIC PANEL
ALT: 331 IU/L — ABNORMAL HIGH (ref 0–32)
AST: 364 IU/L — ABNORMAL HIGH (ref 0–40)
Albumin/Globulin Ratio: 1 — ABNORMAL LOW (ref 1.2–2.2)
Albumin: 3.1 g/dL — ABNORMAL LOW (ref 3.7–4.7)
Alkaline Phosphatase: 853 IU/L — ABNORMAL HIGH (ref 44–121)
BUN/Creatinine Ratio: 26 (ref 12–28)
BUN: 12 mg/dL (ref 8–27)
Bilirubin Total: 1.9 mg/dL — ABNORMAL HIGH (ref 0.0–1.2)
CO2: 20 mmol/L (ref 20–29)
Calcium: 8.4 mg/dL — ABNORMAL LOW (ref 8.7–10.3)
Chloride: 103 mmol/L (ref 96–106)
Creatinine, Ser: 0.46 mg/dL — ABNORMAL LOW (ref 0.57–1.00)
GFR calc Af Amer: 109 mL/min/{1.73_m2} (ref >59)
GFR calc non Af Amer: 95 mL/min/{1.73_m2} (ref >59)
Globulin, Total: 3 g/dL (ref 1.5–4.5)
Glucose: 111 mg/dL — ABNORMAL HIGH (ref 65–99)
Potassium: 3.8 mmol/L (ref 3.5–5.2)
Sodium: 136 mmol/L (ref 134–144)
Total Protein: 6.1 g/dL (ref 6.0–8.5)

## 2020-03-04 LAB — LIPASE: Lipase: 5 U/L — ABNORMAL LOW (ref 14–85)

## 2020-03-04 LAB — AMYLASE: Amylase: 9 U/L — ABNORMAL LOW (ref 31–110)

## 2020-03-04 NOTE — Telephone Encounter (Signed)
[name], I called and spoke with patient. Describes progressive abdominal pain generalized. No fevers. She describes possible chills. Highest temperature 97.0. Nausea persists and thinks Zofran not helping. Looking at LFT pattern, concern is whether her pancreatic head mass is now causing possible obstruction and we are catching the lab tests on the way up. Could be another issue as well, so let's have patient have the following labs tomorrow: Acute Hepatitis Panel (HAV IgM, HBsAg, HBcAb IgM, HCV Ab), CBC, CMP, INR, Lipase Let's have patient come in for a CTAP IV/PO contrast as soon as able to see, if she now has biliary obstruction she will need an ERCP. If CT has to be scheduled over weekend, then [facility] will be called. If she develops fevers she needs to let us know and would start on Ciprofloxacin 500 mg BID. If severe pain and cannot tolerate she knows she needs to come into the hospital. I do not know if it is possible whether she could come in for IVFs in the Oncology clinic, but I'll put Dr. Benay Spice on this to see if here is anything else he can think of. She is scheduled next week for [facility] placement before chemotherapy is to begin, but if she has abnormal LFTs or a developing obstruction may need alleviation first. I'll place Dr. Hilarie Fredrickson on this as well as he is her primary GI. Thanks. GM

## 2020-03-04 NOTE — Telephone Encounter (Signed)
Dr Rush Landmark please see the labs from urgent care yesterday. She began to have nausea and vomiting.  She is still having back pain as well as nausea.  Please advise

## 2020-03-04 NOTE — Telephone Encounter (Signed)
Lab order entered for tomorrow the pt will come in around 8 am.   CT ordered as stat for 1/27 at 1130 am to arrive at 1115 am at [facility]  The pt will pick up contrast after labs in the morning.    Dr Rush Landmark the pt asked about nausea meds.  Are you sending her something to the pharmacy?  She is not sure if she can drink the contrast for the CT due to nausea and vomiting.

## 2020-03-05 ENCOUNTER — Other Ambulatory Visit: Payer: Self-pay

## 2020-03-05 ENCOUNTER — Emergency Department (HOSPITAL_COMMUNITY): Payer: Medicare Other

## 2020-03-05 ENCOUNTER — Encounter (HOSPITAL_COMMUNITY): Payer: Self-pay

## 2020-03-05 ENCOUNTER — Encounter (HOSPITAL_COMMUNITY): Payer: Self-pay | Admitting: Emergency Medicine

## 2020-03-05 ENCOUNTER — Ambulatory Visit (HOSPITAL_COMMUNITY): Payer: Medicare Other

## 2020-03-05 ENCOUNTER — Inpatient Hospital Stay (HOSPITAL_COMMUNITY)
Admission: EM | Admit: 2020-03-05 | Discharge: 2020-03-12 | DRG: 423 | Disposition: A | Discharge status: Home / Self Care | Payer: Medicare Other | Attending: Family Medicine | Admitting: Family Medicine

## 2020-03-05 DIAGNOSIS — K760 Fatty (change of) liver, not elsewhere classified: Secondary | ICD-10-CM | POA: Diagnosis present

## 2020-03-05 DIAGNOSIS — I10 Essential (primary) hypertension: Secondary | ICD-10-CM | POA: Diagnosis present

## 2020-03-05 DIAGNOSIS — Z9889 Other specified postprocedural states: Secondary | ICD-10-CM | POA: Diagnosis not present

## 2020-03-05 DIAGNOSIS — Z79899 Other long term (current) drug therapy: Secondary | ICD-10-CM

## 2020-03-05 DIAGNOSIS — K219 Gastro-esophageal reflux disease without esophagitis: Secondary | ICD-10-CM | POA: Diagnosis present

## 2020-03-05 DIAGNOSIS — E119 Type 2 diabetes mellitus without complications: Secondary | ICD-10-CM | POA: Diagnosis present

## 2020-03-05 DIAGNOSIS — C259 Malignant neoplasm of pancreas, unspecified: Secondary | ICD-10-CM

## 2020-03-05 DIAGNOSIS — Z419 Encounter for procedure for purposes other than remedying health state, unspecified: Secondary | ICD-10-CM

## 2020-03-05 DIAGNOSIS — E46 Unspecified protein-calorie malnutrition: Secondary | ICD-10-CM | POA: Diagnosis present

## 2020-03-05 DIAGNOSIS — R1011 Right upper quadrant pain: Secondary | ICD-10-CM | POA: Diagnosis not present

## 2020-03-05 DIAGNOSIS — K831 Obstruction of bile duct: Secondary | ICD-10-CM | POA: Diagnosis not present

## 2020-03-05 DIAGNOSIS — R112 Nausea with vomiting, unspecified: Secondary | ICD-10-CM | POA: Diagnosis present

## 2020-03-05 DIAGNOSIS — R17 Unspecified jaundice: Secondary | ICD-10-CM

## 2020-03-05 DIAGNOSIS — Z880 Allergy status to penicillin: Secondary | ICD-10-CM

## 2020-03-05 DIAGNOSIS — E876 Hypokalemia: Secondary | ICD-10-CM | POA: Diagnosis not present

## 2020-03-05 DIAGNOSIS — Z881 Allergy status to other antibiotic agents status: Secondary | ICD-10-CM | POA: Diagnosis not present

## 2020-03-05 DIAGNOSIS — C250 Malignant neoplasm of head of pancreas: Principal | ICD-10-CM | POA: Diagnosis present

## 2020-03-05 DIAGNOSIS — I480 Paroxysmal atrial fibrillation: Secondary | ICD-10-CM | POA: Diagnosis present

## 2020-03-05 DIAGNOSIS — Z90710 Acquired absence of both cervix and uterus: Secondary | ICD-10-CM | POA: Diagnosis not present

## 2020-03-05 DIAGNOSIS — Z794 Long term (current) use of insulin: Secondary | ICD-10-CM | POA: Diagnosis not present

## 2020-03-05 DIAGNOSIS — R109 Unspecified abdominal pain: Secondary | ICD-10-CM | POA: Diagnosis not present

## 2020-03-05 DIAGNOSIS — R945 Abnormal results of liver function studies: Secondary | ICD-10-CM

## 2020-03-05 DIAGNOSIS — IMO0002 Reserved for concepts with insufficient information to code with codable children: Secondary | ICD-10-CM | POA: Diagnosis present

## 2020-03-05 DIAGNOSIS — K861 Other chronic pancreatitis: Secondary | ICD-10-CM | POA: Diagnosis not present

## 2020-03-05 DIAGNOSIS — Z7951 Long term (current) use of inhaled steroids: Secondary | ICD-10-CM

## 2020-03-05 DIAGNOSIS — E43 Unspecified severe protein-calorie malnutrition: Secondary | ICD-10-CM | POA: Diagnosis not present

## 2020-03-05 DIAGNOSIS — Z66 Do not resuscitate: Secondary | ICD-10-CM | POA: Diagnosis present

## 2020-03-05 DIAGNOSIS — E785 Hyperlipidemia, unspecified: Secondary | ICD-10-CM | POA: Diagnosis present

## 2020-03-05 DIAGNOSIS — E860 Dehydration: Secondary | ICD-10-CM | POA: Diagnosis not present

## 2020-03-05 DIAGNOSIS — K838 Other specified diseases of biliary tract: Secondary | ICD-10-CM | POA: Diagnosis not present

## 2020-03-05 DIAGNOSIS — Z9049 Acquired absence of other specified parts of digestive tract: Secondary | ICD-10-CM

## 2020-03-05 DIAGNOSIS — I700 Atherosclerosis of aorta: Secondary | ICD-10-CM | POA: Diagnosis present

## 2020-03-05 DIAGNOSIS — R7401 Elevation of levels of liver transaminase levels: Secondary | ICD-10-CM | POA: Diagnosis present

## 2020-03-05 DIAGNOSIS — Z6822 Body mass index (BMI) 22.0-22.9, adult: Secondary | ICD-10-CM

## 2020-03-05 DIAGNOSIS — R7989 Other specified abnormal findings of blood chemistry: Secondary | ICD-10-CM | POA: Diagnosis not present

## 2020-03-05 DIAGNOSIS — E871 Hypo-osmolality and hyponatremia: Secondary | ICD-10-CM | POA: Diagnosis not present

## 2020-03-05 DIAGNOSIS — Z95828 Presence of other vascular implants and grafts: Secondary | ICD-10-CM

## 2020-03-05 DIAGNOSIS — E1165 Type 2 diabetes mellitus with hyperglycemia: Secondary | ICD-10-CM | POA: Diagnosis present

## 2020-03-05 DIAGNOSIS — E7800 Pure hypercholesterolemia, unspecified: Secondary | ICD-10-CM | POA: Diagnosis present

## 2020-03-05 DIAGNOSIS — Z8616 Personal history of COVID-19: Secondary | ICD-10-CM

## 2020-03-05 DIAGNOSIS — R935 Abnormal findings on diagnostic imaging of other abdominal regions, including retroperitoneum: Secondary | ICD-10-CM

## 2020-03-05 DIAGNOSIS — Z7982 Long term (current) use of aspirin: Secondary | ICD-10-CM | POA: Diagnosis not present

## 2020-03-05 DIAGNOSIS — R1010 Upper abdominal pain, unspecified: Secondary | ICD-10-CM | POA: Diagnosis not present

## 2020-03-05 LAB — CBC
HCT: 35.3 % — ABNORMAL LOW (ref 36.0–46.0)
Hemoglobin: 11.3 g/dL — ABNORMAL LOW (ref 12.0–15.0)
MCH: 28.8 pg (ref 26.0–34.0)
MCHC: 32 g/dL (ref 30.0–36.0)
MCV: 89.8 fL (ref 80.0–100.0)
Platelets: 263 10*3/uL (ref 150–400)
RBC: 3.93 MIL/uL (ref 3.87–5.11)
RDW: 15.3 % (ref 11.5–15.5)
WBC: 6.2 10*3/uL (ref 4.0–10.5)
nRBC: 0 % (ref 0.0–0.2)

## 2020-03-05 LAB — COMPREHENSIVE METABOLIC PANEL
ALT: 318 U/L — ABNORMAL HIGH (ref 0–44)
AST: 267 U/L — ABNORMAL HIGH (ref 15–41)
Albumin: 2.6 g/dL — ABNORMAL LOW (ref 3.5–5.0)
Alkaline Phosphatase: 748 U/L — ABNORMAL HIGH (ref 38–126)
Anion gap: 12 (ref 5–15)
BUN: 14 mg/dL (ref 8–23)
CO2: 21 mmol/L — ABNORMAL LOW (ref 22–32)
Calcium: 8.4 mg/dL — ABNORMAL LOW (ref 8.9–10.3)
Chloride: 106 mmol/L (ref 98–111)
Creatinine, Ser: 0.63 mg/dL (ref 0.44–1.00)
GFR, Estimated: >60 mL/min (ref >60)
Glucose, Bld: 151 mg/dL — ABNORMAL HIGH (ref 70–99)
Potassium: 3.7 mmol/L (ref 3.5–5.1)
Sodium: 139 mmol/L (ref 135–145)
Total Bilirubin: 4 mg/dL — ABNORMAL HIGH (ref 0.3–1.2)
Total Protein: 6.4 g/dL — ABNORMAL LOW (ref 6.5–8.1)

## 2020-03-05 LAB — URINALYSIS, ROUTINE W REFLEX MICROSCOPIC
Glucose, UA: NEGATIVE mg/dL
Hgb urine dipstick: NEGATIVE
Ketones, ur: 20 mg/dL — AB
Leukocytes,Ua: NEGATIVE
Nitrite: NEGATIVE
Protein, ur: NEGATIVE mg/dL
Specific Gravity, Urine: 1.013 (ref 1.005–1.030)
pH: 6 (ref 5.0–8.0)

## 2020-03-05 LAB — HEMOGLOBIN A1C
Hgb A1c MFr Bld: 7.2 % — ABNORMAL HIGH (ref 4.8–5.6)
Mean Plasma Glucose: 159.94 mg/dL

## 2020-03-05 LAB — PROTIME-INR
INR: 1.2 (ref 0.8–1.2)
Prothrombin Time: 14.4 seconds (ref 11.4–15.2)

## 2020-03-05 LAB — CBG MONITORING, ED
Glucose-Capillary: 149 mg/dL — ABNORMAL HIGH (ref 70–99)
Glucose-Capillary: 143 mg/dL — ABNORMAL HIGH (ref 70–99)
Glucose-Capillary: 134 mg/dL — ABNORMAL HIGH (ref 70–99)

## 2020-03-05 LAB — APTT: aPTT: 28 seconds (ref 24–36)

## 2020-03-05 LAB — LIPASE, BLOOD: Lipase: 12 U/L (ref 11–51)

## 2020-03-05 LAB — SARS CORONAVIRUS 2 BY RT PCR (HOSPITAL ORDER, PERFORMED IN ~~LOC~~ HOSPITAL LAB): SARS Coronavirus 2: POSITIVE — AB

## 2020-03-05 IMAGING — CT CT HEAD W/O CM
2 series · 15 of 30 positions shown, 17 images · non-contrast
Comparison: None.

CLINICAL DATA: History of pancreatic carcinoma

EXAM:
CT HEAD WITHOUT CONTRAST
TECHNIQUE: Contiguous axial images were obtained from the base of the skull
through the vertex without intravenous contrast.

[Series 3: head without · axial · non-contrast · 0.41mm/px · z∈[-124,-4]mm · 7 of 33 slices shown, 9 images]
[im 5/33  brain]
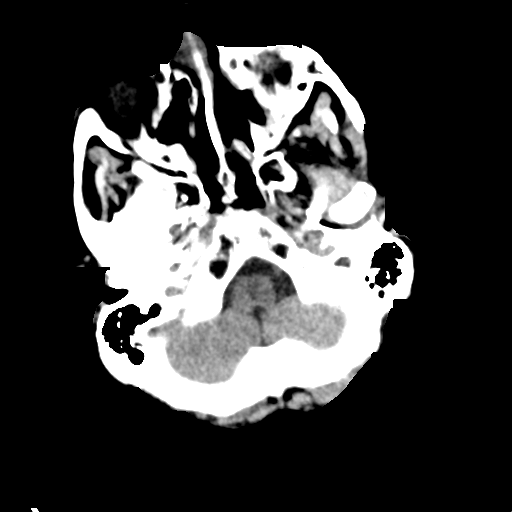
[im 5/33  bone]
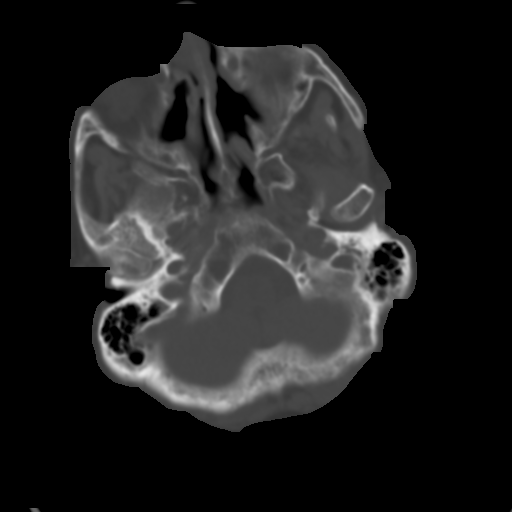
[im 9/33  brain]
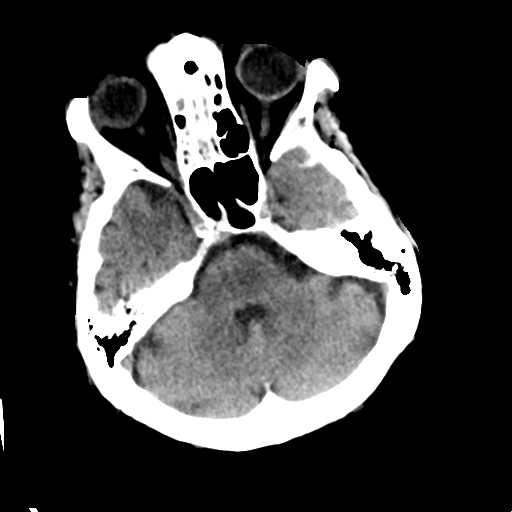
[im 13/33  brain]
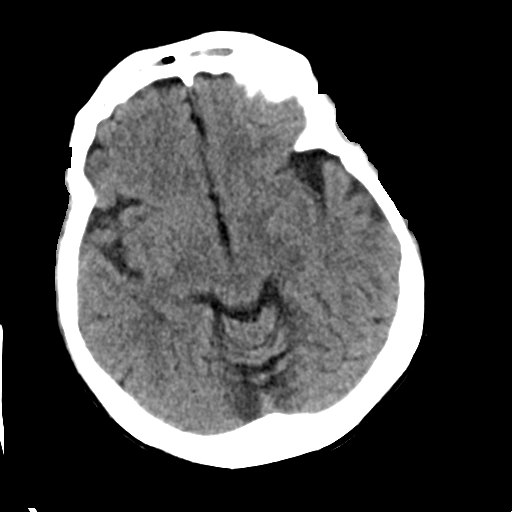
[im 17/33  brain]
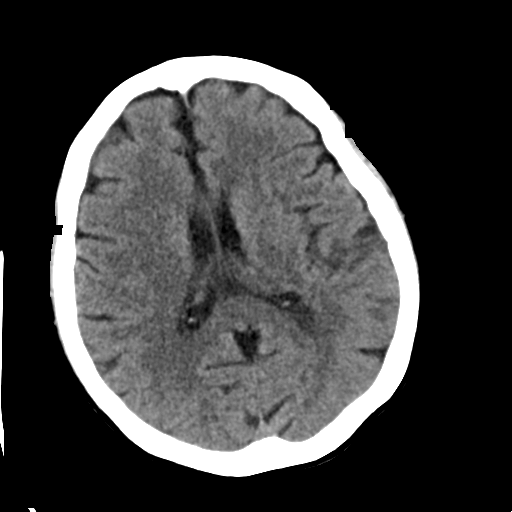
[im 21/33  brain]
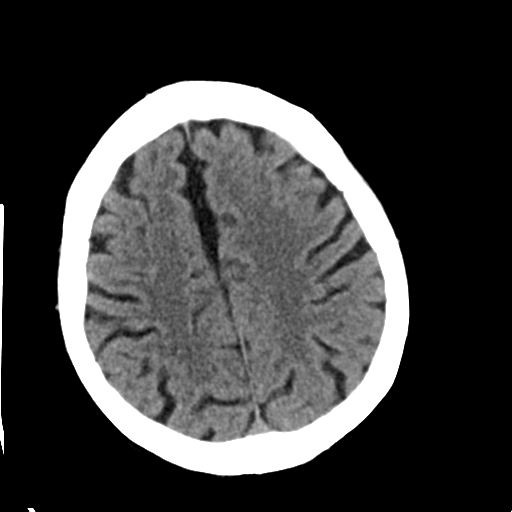
[im 21/33  bone]
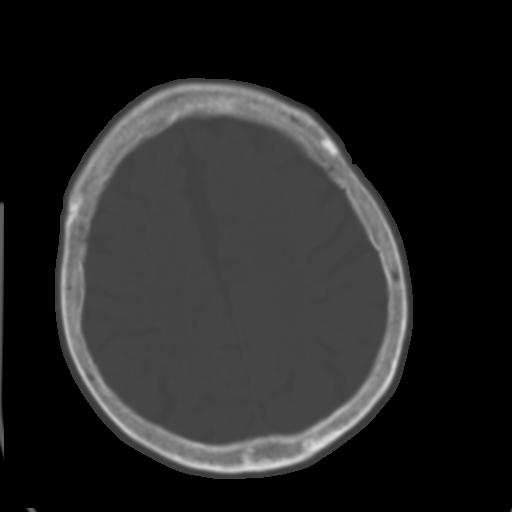
[im 25/33  brain]
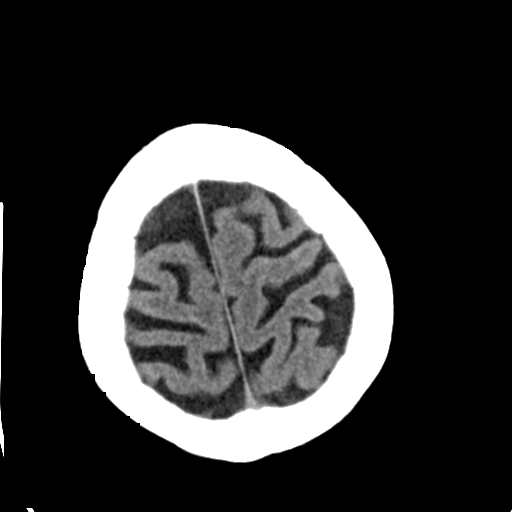
[im 29/33  brain]
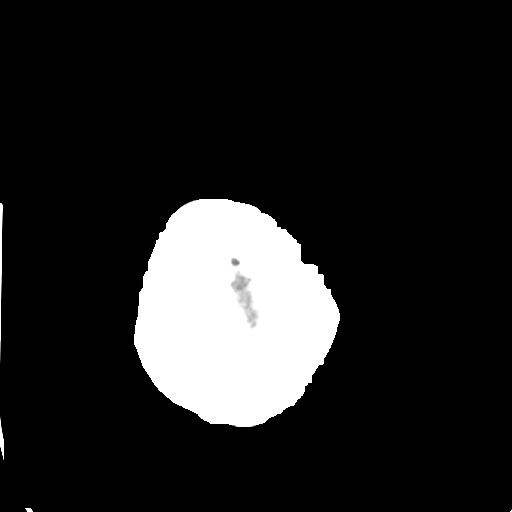

[Series 4: head bone · axial · 0.41mm/px · z∈[-128,+0]mm · 8 of 81 slices shown]
[im 9/81  bone]
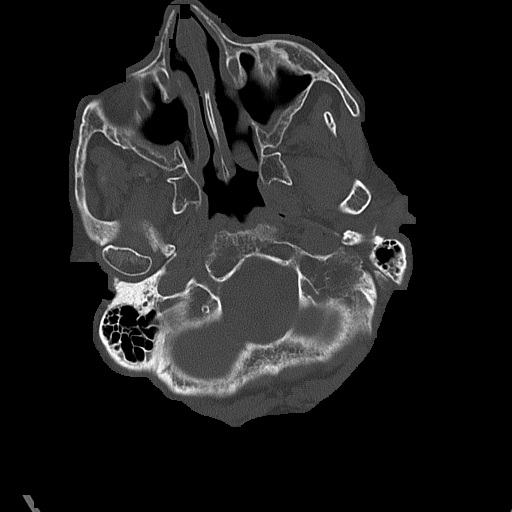
[im 17/81  bone]
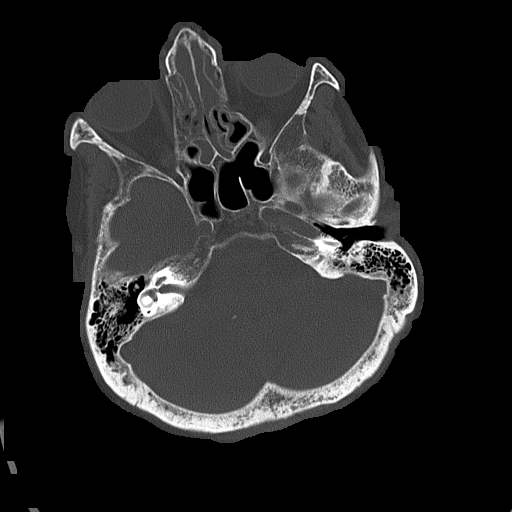
[im 25/81  bone]
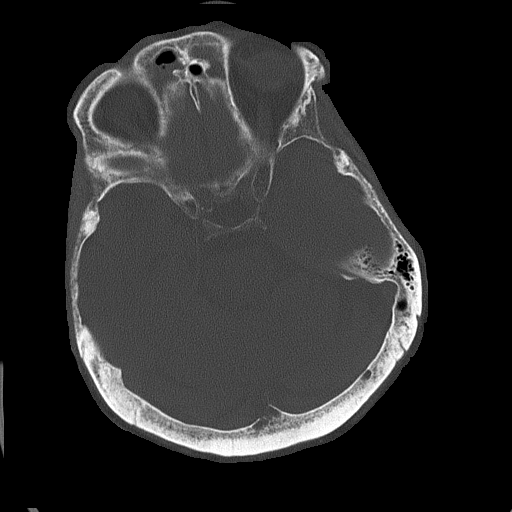
[im 37/81  bone]
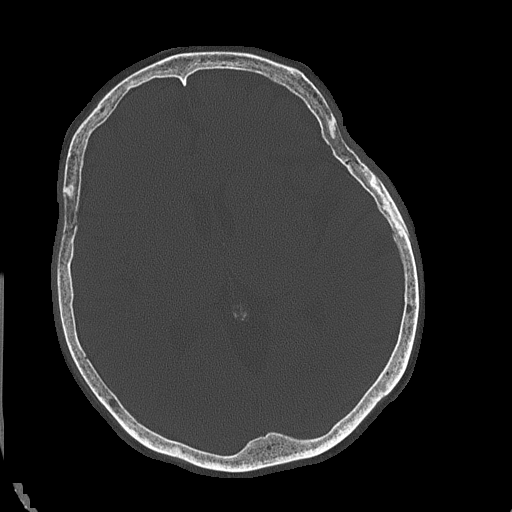
[im 45/81  bone]
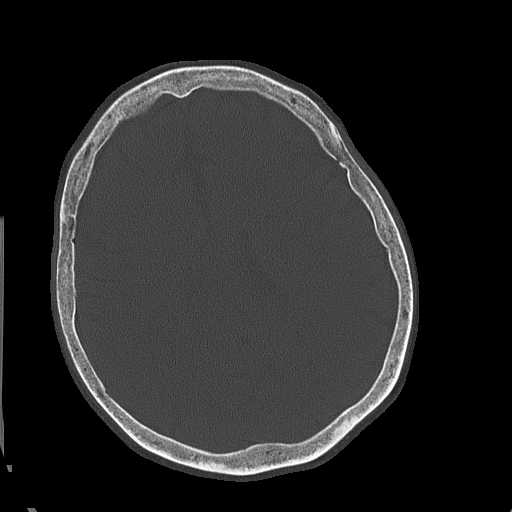
[im 57/81  bone]
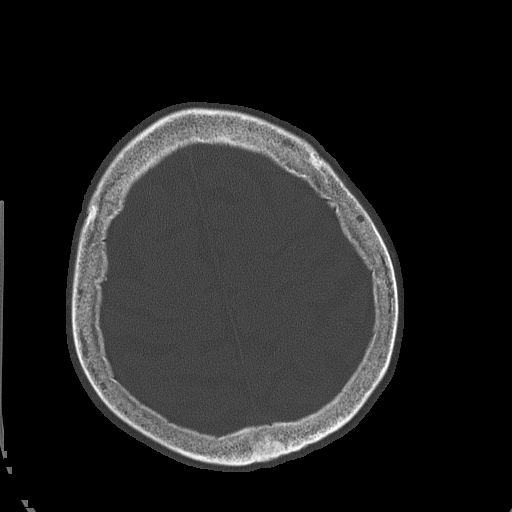
[im 65/81  bone]
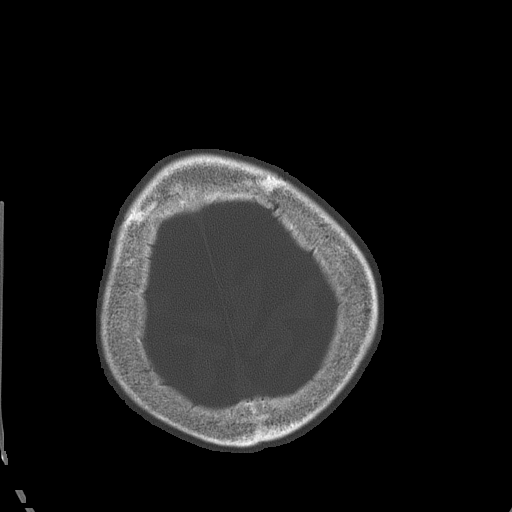
[im 73/81  bone]
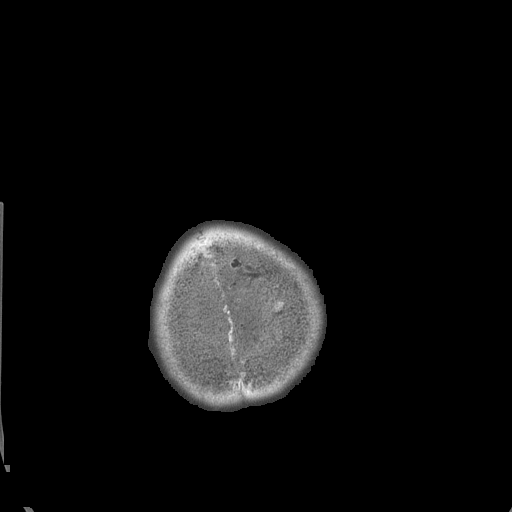

[15 of 30 positions shown; findings below may reference images not displayed]

FINDINGS: Brain: No evidence of acute infarction, hemorrhage, hydrocephalus,
extra-axial collection or mass lesion/mass effect.

Vascular: No hyperdense vessel or unexpected calcification.

Skull: Normal. Negative for fracture or focal lesion.

Sinuses/Orbits: Postsurgical changes are noted in the maxillary
antra bilaterally. Mucosal thickening is noted within the ethmoid
and frontal sinuses.

Other: None.
IMPRESSION: Changes of chronic sinusitis.

No acute intracranial abnormality noted.

## 2020-03-05 IMAGING — CT CT ABD-PELV W/ CM
2 of 5 series · 15 of 46 positions shown, 17 images · IV contrast (Omni 300)
Comparison: CT abdomen pelvis [DATE].

CLINICAL DATA: Nausea and vomiting.  Abdominal pain.

EXAM:
CT ABDOMEN AND PELVIS WITH CONTRAST
TECHNIQUE: Multidetector CT imaging of the abdomen and pelvis was performed
using the standard protocol following bolus administration of
intravenous contrast.
CONTRAST:  100mL OMNIPAQUE IOHEXOL 300 MG/ML  SOLN

[Series 3: a/p w/ 5mm · axial · 0.70mm/px · z∈[-830,-430]mm · 12 of 91 slices shown, 14 images]
[im 6/91  soft-tissue]
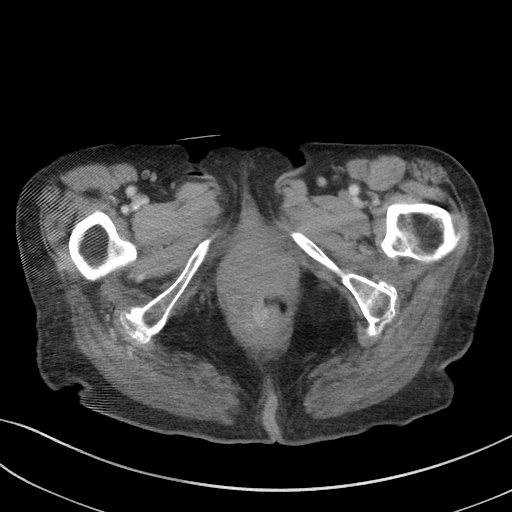
[im 6/91  bone]
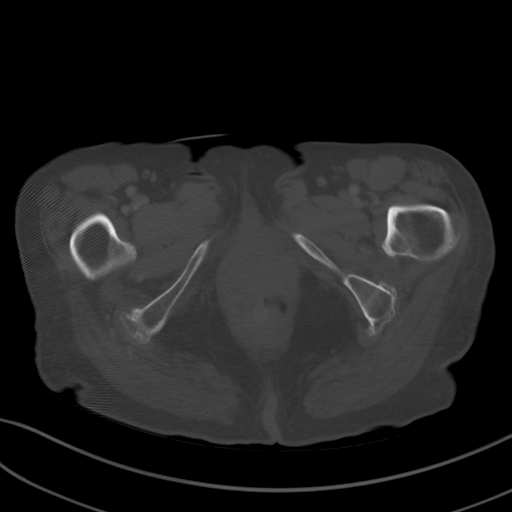
[im 16/91  soft-tissue]
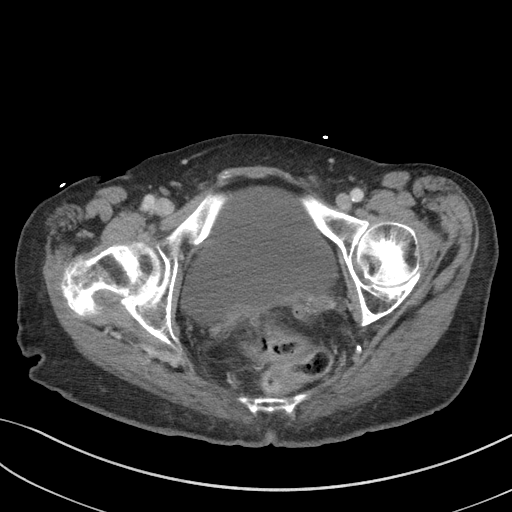
[im 21/91  soft-tissue]
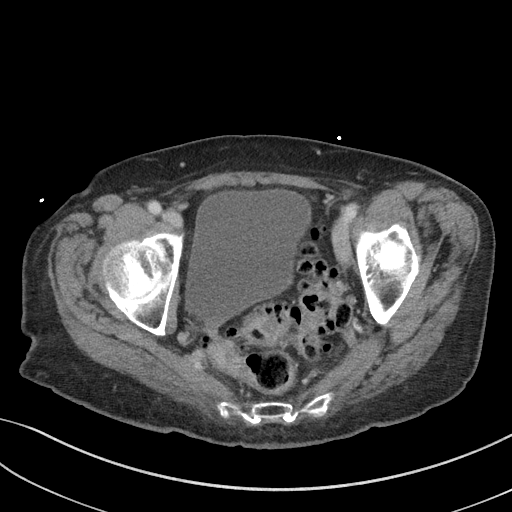
[im 26/91  soft-tissue]
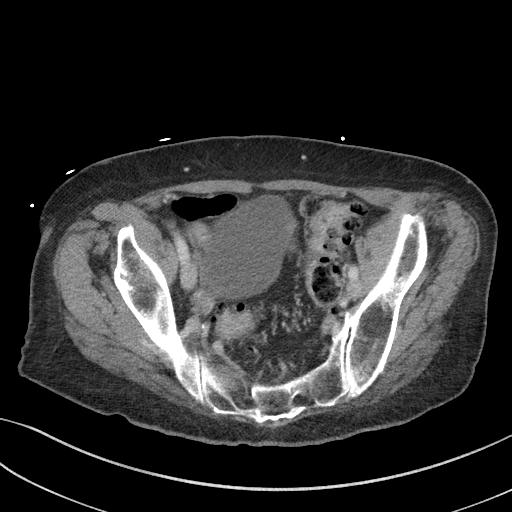
[im 36/91  soft-tissue]
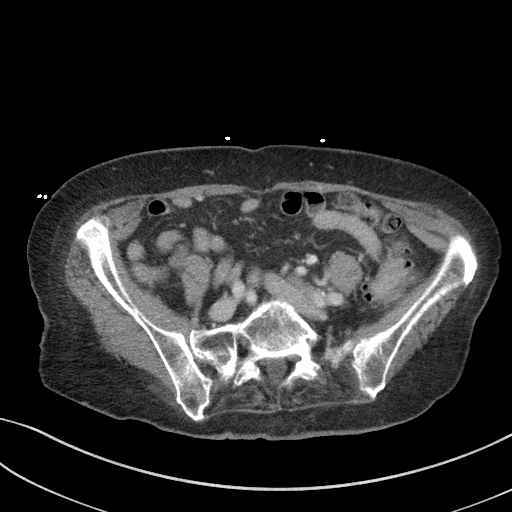
[im 41/91  soft-tissue]
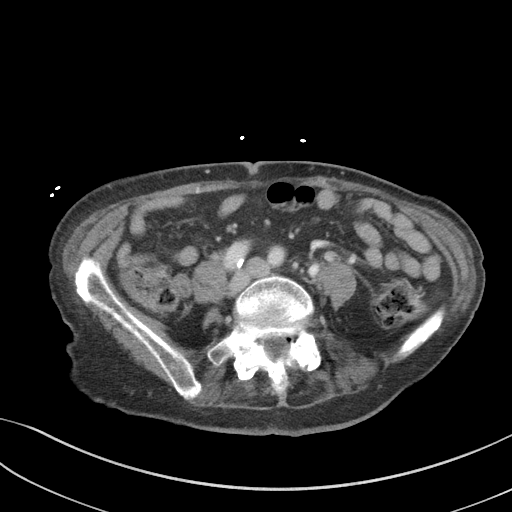
[im 51/91  soft-tissue]
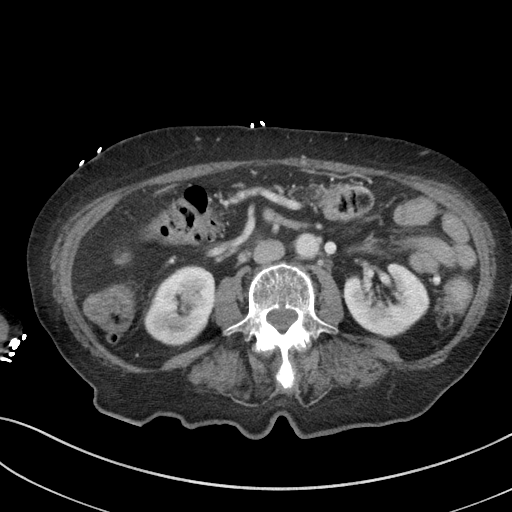
[im 56/91  soft-tissue]
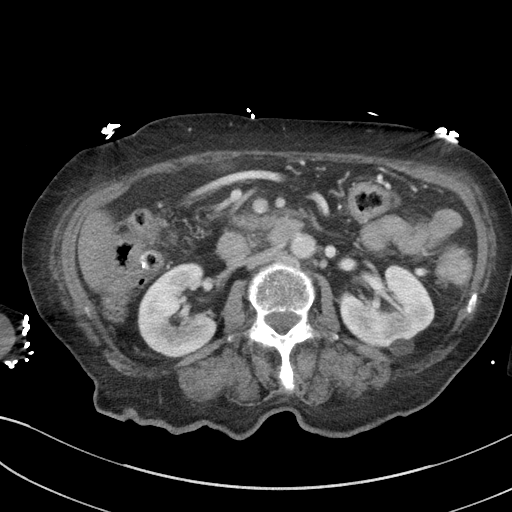
[im 66/91  soft-tissue]
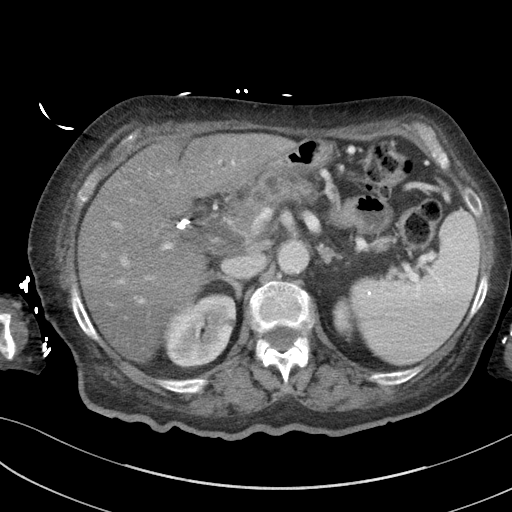
[im 66/91  bone]
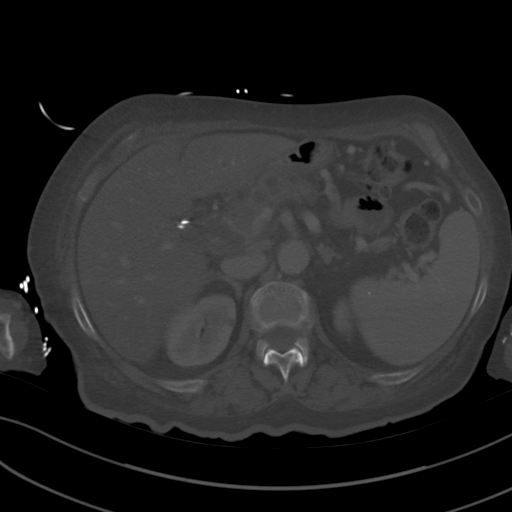
[im 71/91  soft-tissue]
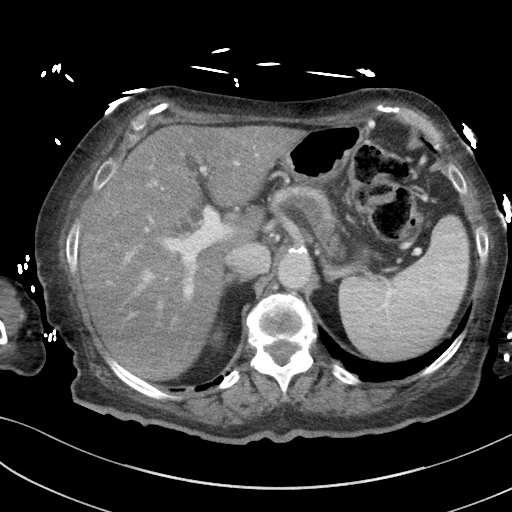
[im 76/91  soft-tissue]
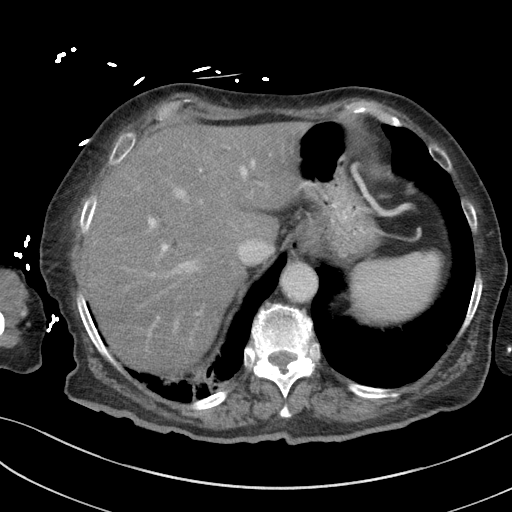
[im 86/91  soft-tissue]
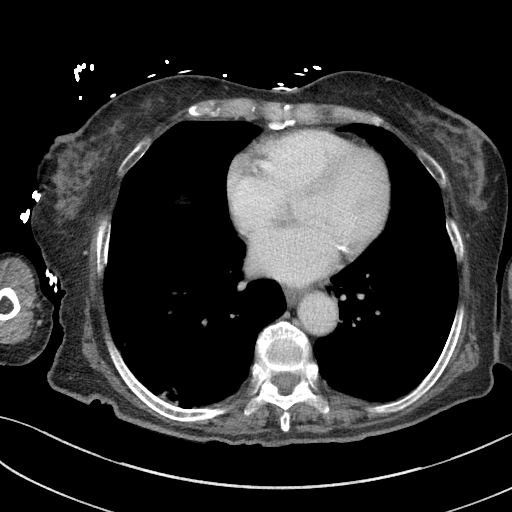

[Series 6: a/p w/ cor · coronal · 0.73mm/px · 3 of 125 slices shown]
[im 42/125  soft-tissue]
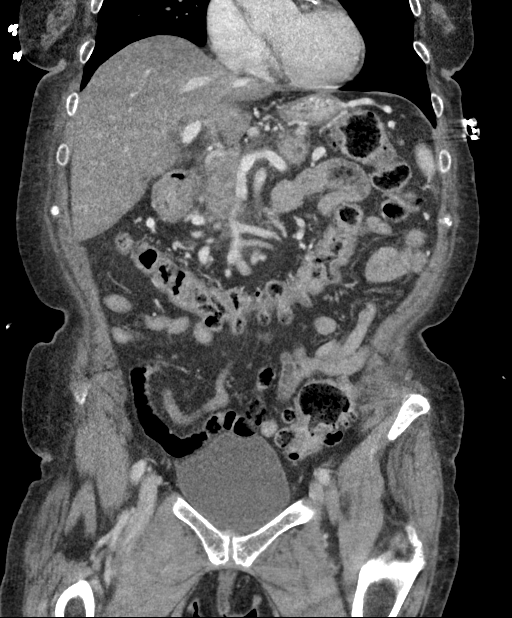
[im 56/125  soft-tissue]
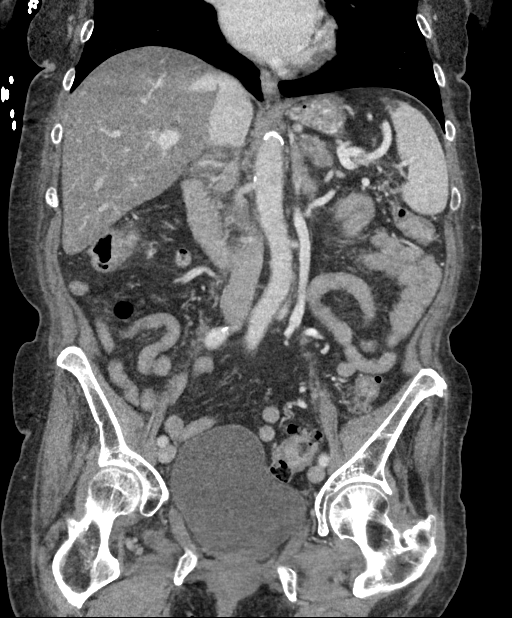
[im 69/125  soft-tissue]
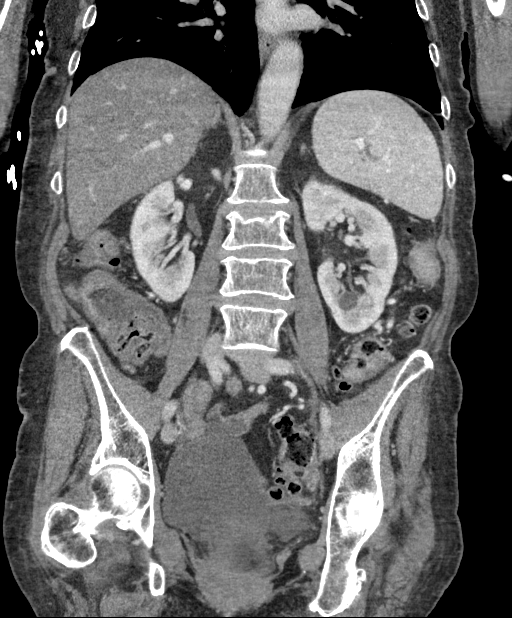

[15 of 46 positions shown; findings below may reference images not displayed]

FINDINGS: Lower chest: Normal heart size. Patchy areas of consolidation right
lower lobe. No pleural effusion.

Hepatobiliary: Liver is diffusely low in attenuation compatible with
steatosis. Prior cholecystectomy. Mild intrahepatic and extrahepatic
biliary ductal dilatation. Common bile duct is dilated to the level
of the pancreatic mass.

Pancreas: The pancreatic duct remains dilated measuring up to 6 mm.
Slight interval increase in size of ill-defined mass within the
region the pancreatic head/uncinate process measuring 3.5 x 3.7 cm.
There is marked narrowing of the adjacent portal vein. Minimal
peripancreatic fat stranding. Redemonstrated portosystemic
collateral vessels within the upper abdomen.

Spleen: Unremarkable.

Adrenals/Urinary Tract: Normal adrenal glands. Kidneys enhance
symmetrically with contrast. Partially exophytic cyst mid pole left
kidney. Additional subcentimeter too small to characterize
low-attenuation renal lesions. Urinary bladder is unremarkable.

Stomach/Bowel: Pancolonic diverticulosis. No CT evidence for acute
diverticulitis. Normal morphology of the stomach.

Vascular/Lymphatic: Normal caliber abdominal aorta. Peripheral
calcified atherosclerotic plaque.

Reproductive: Prior hysterectomy.

Other: None.

Musculoskeletal: Multiple unchanged compression fractures involving
the lower thoracic and lumbar spine. No aggressive appearing osseous
lesions.
IMPRESSION: 1. Slight interval increase in size of ill-defined mass within the
region of the pancreatic head/uncinate process measuring up to
cm. There is marked narrowing of the adjacent portal vein. Findings
are compatible with history of pancreatic adenocarcinoma.
2. Hepatic steatosis.
3. Patchy areas of consolidation right lower lobe which may
represent infection. Recommend continued attention on follow-up to
ensure resolution.
4. Pancolonic diverticulosis without CT evidence for acute
diverticulitis.
5. Multiple unchanged compression fractures involving the lower
thoracic and lumbar spine.
6. Aortic atherosclerosis.

## 2020-03-05 MED ORDER — ONDANSETRON HCL 4 MG/2ML IJ SOLN
4.0000 mg | Freq: Once | INTRAMUSCULAR | Status: AC
  Administered 2020-03-05: 4 mg via INTRAVENOUS
  Filled 2020-03-05: qty 2

## 2020-03-05 MED ORDER — MORPHINE SULFATE (PF) 4 MG/ML IV SOLN
4.0000 mg | Freq: Once | INTRAVENOUS | Status: AC
  Administered 2020-03-05: 4 mg via INTRAVENOUS
  Filled 2020-03-05: qty 1

## 2020-03-05 MED ORDER — INSULIN ASPART 100 UNIT/ML ~~LOC~~ SOLN
0.0000 [IU] | Freq: Every day | SUBCUTANEOUS | Status: DC
  Administered 2020-03-06: 2 [IU] via SUBCUTANEOUS
  Administered 2020-03-07: 3 [IU] via SUBCUTANEOUS
  Administered 2020-03-08: 2 [IU] via SUBCUTANEOUS
  Administered 2020-03-09: 4 [IU] via SUBCUTANEOUS
  Administered 2020-03-11: 5 [IU] via SUBCUTANEOUS

## 2020-03-05 MED ORDER — ONDANSETRON HCL 4 MG PO TABS: 4.0000 mg | ORAL_TABLET | Freq: Four times a day (QID) | ORAL | Status: DC | PRN

## 2020-03-05 MED ORDER — SODIUM CHLORIDE 0.9 % IV BOLUS
1000.0000 mL | Freq: Once | INTRAVENOUS | Status: AC
  Administered 2020-03-05: 1000 mL via INTRAVENOUS

## 2020-03-05 MED ORDER — INSULIN ASPART 100 UNIT/ML ~~LOC~~ SOLN
0.0000 [IU] | Freq: Three times a day (TID) | SUBCUTANEOUS | Status: DC
  Administered 2020-03-06: 2 [IU] via SUBCUTANEOUS
  Administered 2020-03-06: 1 [IU] via SUBCUTANEOUS
  Administered 2020-03-06: 5 [IU] via SUBCUTANEOUS
  Administered 2020-03-07 (×2): 2 [IU] via SUBCUTANEOUS
  Administered 2020-03-07: 3 [IU] via SUBCUTANEOUS
  Administered 2020-03-08: 5 [IU] via SUBCUTANEOUS
  Administered 2020-03-08: 3 [IU] via SUBCUTANEOUS
  Administered 2020-03-08: 5 [IU] via SUBCUTANEOUS
  Administered 2020-03-09 (×2): 7 [IU] via SUBCUTANEOUS
  Administered 2020-03-10: 3 [IU] via SUBCUTANEOUS
  Administered 2020-03-10: 5 [IU] via SUBCUTANEOUS
  Administered 2020-03-10: 2 [IU] via SUBCUTANEOUS
  Administered 2020-03-11: 3 [IU] via SUBCUTANEOUS
  Administered 2020-03-11: 5 [IU] via SUBCUTANEOUS
  Administered 2020-03-12 (×2): 3 [IU] via SUBCUTANEOUS

## 2020-03-05 MED ORDER — SODIUM CHLORIDE 0.9 % IV SOLN: INTRAVENOUS | Status: DC

## 2020-03-05 MED ORDER — ENOXAPARIN SODIUM 40 MG/0.4ML ~~LOC~~ SOLN: 40.0000 mg | SUBCUTANEOUS | Status: DC

## 2020-03-05 MED ORDER — OXYCODONE HCL 5 MG PO TABS
5.0000 mg | ORAL_TABLET | Freq: Four times a day (QID) | ORAL | Status: DC | PRN
  Administered 2020-03-07 – 2020-03-11 (×7): 5 mg via ORAL
  Filled 2020-03-05 (×7): qty 1

## 2020-03-05 MED ORDER — SODIUM CHLORIDE 0.9% FLUSH
3.0000 mL | Freq: Two times a day (BID) | INTRAVENOUS | Status: DC
  Administered 2020-03-07 – 2020-03-11 (×7): 3 mL via INTRAVENOUS

## 2020-03-05 MED ORDER — PROCHLORPERAZINE MALEATE 10 MG PO TABS: 10.0000 mg | ORAL_TABLET | Freq: Three times a day (TID) | ORAL | 0 refills | Status: DC | PRN

## 2020-03-05 MED ORDER — PROCHLORPERAZINE EDISYLATE 10 MG/2ML IJ SOLN
10.0000 mg | Freq: Four times a day (QID) | INTRAMUSCULAR | Status: DC | PRN
  Administered 2020-03-05: 10 mg via INTRAVENOUS
  Filled 2020-03-05: qty 2

## 2020-03-05 MED ORDER — MORPHINE SULFATE (PF) 2 MG/ML IV SOLN
2.0000 mg | INTRAVENOUS | Status: DC | PRN
  Administered 2020-03-11: 2 mg via INTRAVENOUS
  Filled 2020-03-05: qty 1

## 2020-03-05 MED ORDER — ONDANSETRON HCL 4 MG/2ML IJ SOLN
4.0000 mg | Freq: Four times a day (QID) | INTRAMUSCULAR | Status: DC | PRN
  Administered 2020-03-05 – 2020-03-07 (×2): 4 mg via INTRAVENOUS
  Filled 2020-03-05 (×2): qty 2

## 2020-03-05 MED ORDER — ALBUTEROL SULFATE (2.5 MG/3ML) 0.083% IN NEBU: 2.5000 mg | INHALATION_SOLUTION | Freq: Four times a day (QID) | RESPIRATORY_TRACT | Status: DC | PRN

## 2020-03-05 MED ORDER — IOHEXOL 300 MG/ML  SOLN
100.0000 mL | Freq: Once | INTRAMUSCULAR | Status: AC | PRN
  Administered 2020-03-05: 100 mL via INTRAVENOUS

## 2020-03-05 NOTE — Telephone Encounter (Signed)
I saw this, this morning. Looks like we were correct in our concern for biliary obstruction. She is being evaluated by the inpatient GI team now with plans for potential ERCP in the coming days. Thanks. GM

## 2020-03-05 NOTE — ED Notes (Signed)
Patient transported to CT 

## 2020-03-05 NOTE — Telephone Encounter (Signed)
Call placed to the pt to make her aware that a prescription for compazine has been sent to the pharmacy.  The daughter answered the phone and advised that the pt is at [facility] ED.  FYI Dr Rush Landmark

## 2020-03-05 NOTE — Telephone Encounter (Signed)
Send Compazine 10 mg Q8H PRN. Thank you. GM

## 2020-03-05 NOTE — H&P (Signed)
History and Physical    THRESEA DOBLE KDT:[id number] DOB: [birth year] DOA: 03/05/2020  Referring MD/NP/PA: Alphonzo Grieve, PA-C PCP: Lilian Coma., MD  Patient coming from: Home  Chief Complaint: Abdominal pain with nausea, vomiting  I have personally briefly reviewed patient's old medical records in [place]   HPI: Nancy Hawkins is a 80 y.o. female with medical history significant of hypertension, hyperlipidemia, A. Fib, diabetes mellitus type 2, pancreatic cancer diagnosed in October 2021 and recent COVID-19 infection on 02/10/2019 presents with complaints of abdominal pain with nausea and vomiting over the last week.  She complains of having lower abdominal pain that she describes as crampy in nature with to 2-3 loose stools per day that are gray-colored.  Patient reports being unable to keep any significant amount of food or liquids down over the last 2 to 3 days which gave her concern because she is a diabetic.  She was seen in urgent care and noted to have elevated liver enzymes 2 days ago's.  Due to having Covid-19 it delayed her starting chemotherapy treatment.  Plan is for her to have the Port-A-Cath placed next week to initiate chemotherapy.  ED Course: Upon admission into the emergency department patient was seen to be afebrile with respirations 12-23, and all other vital signs maintained.  Labs significant for hemoglobin 11.3, glucose 151, alkaline phosphatase 748, albumin 2.6, AST 267, ALT 318, total bilirubin 4, lipase 12, and INR 1.2.  Gastroenterology had been formally consulted.  Patient having given Zofran, morphine, and 1 L of normal saline IV fluids.  Review of Systems  Constitutional: Positive for malaise/fatigue and weight loss.  HENT: Negative for congestion and nosebleeds.   Eyes: Negative for photophobia and pain.  Respiratory: Negative for shortness of breath.   Cardiovascular: Negative for chest pain and leg swelling.  Gastrointestinal: Positive for abdominal  pain, diarrhea, nausea and vomiting. Negative for blood in stool.  Genitourinary: Negative for dysuria and hematuria.  Musculoskeletal: Positive for myalgias. Negative for falls.  Neurological: Positive for weakness. Negative for loss of consciousness.  Psychiatric/Behavioral: Negative for substance abuse. The patient has insomnia.     Past Medical History:  Diagnosis Date  . Diabetes mellitus without complication ([place])    type 2  . Dysrhythmia    a-fib 15 yrs ago per patient on 01/17/20  . GERD (gastroesophageal reflux disease)   . Hypercholesteremia   . Hypertension   . Pancreatic cancer ([place])   . Pancreatic mass   . Pancreatitis    Hx  . Seasonal allergies     Past Surgical History:  Procedure Laterality Date  . ABDOMINAL HYSTERECTOMY    . BIOPSY  11/20/2019   Procedure: BIOPSY;  Surgeon: Rush Landmark Telford Nab., MD;  Location: [place];  Service: Gastroenterology;;  . CHOLECYSTECTOMY    . COLONOSCOPY WITH PROPOFOL N/A 11/20/2019   Procedure: COLONOSCOPY WITH PROPOFOL;  Surgeon: Rush Landmark Telford Nab., MD;  Location: [place];  Service: Gastroenterology;  Laterality: N/A;  . ENDOSCOPIC MUCOSAL RESECTION  11/20/2019   Procedure: ENDOSCOPIC MUCOSAL RESECTION;  Surgeon: Rush Landmark Telford Nab., MD;  Location: [place];  Service: Gastroenterology;;  . ESOPHAGOGASTRODUODENOSCOPY (EGD) WITH PROPOFOL N/A 11/20/2019   Procedure: ESOPHAGOGASTRODUODENOSCOPY (EGD) WITH PROPOFOL;  Surgeon: Irving Copas., MD;  Location: [place];  Service: Gastroenterology;  Laterality: N/A;  . ESOPHAGOGASTRODUODENOSCOPY (EGD) WITH PROPOFOL N/A 01/20/2020   Procedure: ESOPHAGOGASTRODUODENOSCOPY (EGD) WITH PROPOFOL;  Surgeon: Rush Landmark Telford Nab., MD;  Location: [place];  Service: Gastroenterology;  Laterality: N/A;  .  EUS N/A 01/20/2020   Procedure: UPPER ENDOSCOPIC ULTRASOUND (EUS) RADIAL;  Surgeon: Lemar LoftyMansouraty, Gabriel Jr., MD;  [facility];  Service:  Gastroenterology;  Laterality: N/A;  . FINE NEEDLE ASPIRATION  11/20/2019   Procedure: FINE NEEDLE ASPIRATION (FNA) LINEAR;  Surgeon: Lemar LoftyMansouraty, Gabriel Jr., MD;  [facility];  Service: Gastroenterology;;  . FINE NEEDLE ASPIRATION BIOPSY  01/20/2020   Procedure: FINE NEEDLE ASPIRATION BIOPSY;  Surgeon: Meridee ScoreMansouraty, Netty StarringGabriel Jr., MD;  [facility];  Service: Gastroenterology;;  . HEMOSTASIS CLIP PLACEMENT  11/20/2019   Procedure: HEMOSTASIS CLIP PLACEMENT;  Surgeon: Lemar LoftyMansouraty, Gabriel Jr., MD;  [facility];  Service: Gastroenterology;;  . NASAL SINUS SURGERY    . POLYPECTOMY  11/20/2019   Procedure: POLYPECTOMY;  Surgeon: Mansouraty, Netty StarringGabriel Jr., MD;  [facility];  Service: Gastroenterology;;  . Sunnie NielsenSUBMUCOSAL LIFTING INJECTION  11/20/2019   Procedure: SUBMUCOSAL LIFTING INJECTION;  Surgeon: Lemar LoftyMansouraty, Gabriel Jr., MD;  [facility];  Service: Gastroenterology;;  . UPPER ESOPHAGEAL ENDOSCOPIC ULTRASOUND (EUS) N/A 11/20/2019   Procedure: UPPER ESOPHAGEAL ENDOSCOPIC ULTRASOUND (EUS);  Surgeon: Lemar LoftyMansouraty, Gabriel Jr., MD;  [facility];  Service: Gastroenterology;  Laterality: N/A;     reports that she has never smoked. She has never used smokeless tobacco. She reports previous alcohol use. She reports that she does not use drugs.  Allergies  Allergen Reactions  . Cephalosporins Shortness Of Breath  . Erythromycin Shortness Of Breath  . [place] Shortness Of Breath  . Penicillins Anaphylaxis    Reaction: 25 years    Family History  Family history unknown: Yes    Prior to Admission medications   Medication Sig Start Date End Date Taking? Authorizing Provider  aspirin EC 81 MG tablet Take 81 mg by mouth daily. Swallow whole.    [provider]  cholecalciferol (VITAMIN D) 25 MCG (1000 UNIT) tablet Take 1,000 Units by mouth daily.    [provider]  diltiazem (TIAZAC) 120 MG 24 hr capsule Take 120 mg by mouth  daily.    [provider]  Ensure Max Protein (ENSURE MAX PROTEIN) LIQD Take 330 mLs (11 oz total) by mouth 2 (two) times daily with a meal. Patient taking differently: Take 11 oz by mouth daily. 11/28/19   Joseph ArtVann, Jessica U, DO  famotidine (PEPCID) 40 MG tablet Take 40 mg by mouth daily as needed for heartburn or indigestion.    [provider]  fexofenadine (ALLEGRA) 180 MG tablet Take 180 mg by mouth daily.    [provider]  guaiFENesin (MUCINEX) 600 MG 12 hr tablet Take 600 mg by mouth at bedtime.    [provider]  insulin aspart (NOVOLOG) 100 UNIT/ML FlexPen Inject 3 Units into the skin 3 (three) times daily with meals. Patient taking differently: Inject 3 Units into the skin 3 (three) times daily before meals. 11/28/19   Joseph ArtVann, Jessica U, DO  Insulin Glargine (BASAGLAR KWIKPEN) 100 UNIT/ML Inject 12 Units into the skin daily. Patient taking differently: Inject 12 Units into the skin at bedtime. 11/28/19   Joseph ArtVann, Jessica U, DO  lidocaine-prilocaine (EMLA) cream Apply 1 application topically as needed. Apply over port 45 minutes prior to having port accessed for chemotherapy treatment. 02/03/20   Ladene ArtistSherrill, Gary B, MD  Multiple Vitamins-Minerals (MULTIVITAMIN WITH MINERALS) tablet Take 1 tablet by mouth daily.    [provider]  Multiple Vitamins-Minerals (OCUVITE EYE HEALTH FORMULA PO) Take 1 tablet by mouth daily.    [provider]  ondansetron (ZOFRAN ODT) 4  MG disintegrating tablet Take 1 tablet (4 mg total) by mouth every 8 (eight) hours as needed for nausea or vomiting. 03/03/20   Hall-Potvin, [place], PA-C  ondansetron (ZOFRAN) 8 MG tablet Take 1 tablet (8 mg total) by mouth every 8 (eight) hours as needed for nausea or vomiting. 02/03/20   Ladell Pier, MD  Probiotic Product (PROBIOTIC DAILY) CAPS Take 1 tablet by mouth daily.    [provider]  prochlorperazine (COMPAZINE) 10 MG tablet Take 1 tablet (10 mg total) by  mouth every 6 (six) hours as needed for nausea or vomiting. 02/03/20   Ladell Pier, MD  prochlorperazine (COMPAZINE) 10 MG tablet Take 1 tablet (10 mg total) by mouth every 8 (eight) hours as needed for nausea or vomiting. 03/05/20   Mansouraty, Telford Nab., MD  telmisartan (MICARDIS) 40 MG tablet Take 40 mg by mouth daily.    [provider]  traMADol (ULTRAM) 50 MG tablet Take 1 tablet (50 mg total) by mouth every 8 (eight) hours as needed for moderate pain. 01/20/20   Mansouraty, Telford Nab., MD  triamcinolone (NASACORT) 55 MCG/ACT AERO nasal inhaler Place 2 sprays into the nose daily as needed (Congestion).    [provider]    Physical Exam:  Constitutional: Elderly female who appears to be ill but in no acute Vitals:   03/05/20 1215 03/05/20 1230 03/05/20 1245 03/05/20 1445  BP: (!) 140/57 (!) 133/58 (!) 130/55 (!) 146/63  Pulse: 74 70 63 72  Resp: 15 15 17  (!) 23  Temp:    97.7 F (36.5 C)  TempSrc:    Oral  SpO2: 95% 97% 96% 100%  Weight:      Height:       Eyes: PERRL, lids and conjunctivae normal ENMT: Mucous membranes are moist. Posterior pharynx clear of any exudate or lesions.  Neck: normal, supple, no masses, no thyromegaly Respiratory: clear to auscultation bilaterally, no wheezing, no crackles. Normal respiratory effort. No accessory muscle use.  Cardiovascular: Regular rate and rhythm, no murmurs / rubs / gallops. No extremity edema. 2+ pedal pulses. No carotid bruits.  Abdomen: Mild generalized tenderness palpation appreciated Musculoskeletal: no clubbing / cyanosis. No joint deformity upper and lower extremities. Good ROM, no contractures. Normal muscle tone.  Skin: no rashes, lesions, ulcers. No induration Neurologic: CN 2-12 grossly intact. Sensation intact, DTR normal. Strength 5/5 in all 4.  Psychiatric: Normal judgment and insight. Alert and oriented x 3. Normal mood.     Labs on Admission: I have personally reviewed following labs and  imaging studies  CBC: Recent Labs  Lab 03/05/20 0857  WBC 6.2  HGB 11.3*  HCT 35.3*  MCV 89.8  PLT [id number]   Basic Metabolic Panel: Recent Labs  Lab 03/03/20 1801 03/05/20 0857  NA 136 139  K 3.8 3.7  CL 103 106  CO2 20 21*  GLUCOSE 111* 151*  BUN 12 14  CREATININE 0.46* 0.63  CALCIUM 8.4* 8.4*   GFR: Estimated Creatinine Clearance: 48.2 mL/min (by C-G formula based on SCr of 0.63 mg/dL). Liver Function Tests: Recent Labs  Lab 03/03/20 1801 03/05/20 0857  AST 364* 267*  ALT 331* 318*  ALKPHOS 853* 748*  BILITOT 1.9* 4.0*  PROT 6.1 6.4*  ALBUMIN 3.1* 2.6*   Recent Labs  Lab 03/03/20 1801 03/05/20 0857  LIPASE 5* 12  AMYLASE 9*  --    No results for input(s): AMMONIA in the last 168 hours. Coagulation Profile: Recent Labs  Lab 03/05/20 [phone]  INR 1.2   Cardiac Enzymes: No results for input(s): CKTOTAL, CKMB, CKMBINDEX, TROPONINI in the last 168 hours. BNP (last 3 results) No results for input(s): PROBNP in the last 8760 hours. HbA1C: No results for input(s): HGBA1C in the last 72 hours. CBG: Recent Labs  Lab 03/05/20 0843 03/05/20 1100  GLUCAP 149* 143*   Lipid Profile: No results for input(s): CHOL, HDL, LDLCALC, TRIG, CHOLHDL, LDLDIRECT in the last 72 hours. Thyroid Function Tests: No results for input(s): TSH, T4TOTAL, FREET4, T3FREE, THYROIDAB in the last 72 hours. Anemia Panel: No results for input(s): VITAMINB12, FOLATE, FERRITIN, TIBC, IRON, RETICCTPCT in the last 72 hours. Urine analysis:    Component Value Date/Time   COLORURINE AMBER (A) 03/05/2020 0852   APPEARANCEUR HAZY (A) 03/05/2020 0852   LABSPEC 1.013 03/05/2020 0852   PHURINE 6.0 03/05/2020 0852   GLUCOSEU NEGATIVE 03/05/2020 0852   HGBUR NEGATIVE 03/05/2020 0852   BILIRUBINUR SMALL (A) 03/05/2020 0852   BILIRUBINUR small (A) 11/17/2019 0906   KETONESUR 20 (A) 03/05/2020 0852   PROTEINUR NEGATIVE 03/05/2020 0852   UROBILINOGEN 0.2 11/17/2019 0906   NITRITE NEGATIVE  03/05/2020 0852   LEUKOCYTESUR NEGATIVE 03/05/2020 0852   Sepsis Labs: No results found for this or any previous visit (from the past 240 hour(s)).   Radiological Exams on Admission: CT Head Wo Contrast  Result Date: 03/05/2020 CLINICAL DATA:  History of pancreatic carcinoma EXAM: CT HEAD WITHOUT CONTRAST TECHNIQUE: Contiguous axial images were obtained from the base of the skull through the vertex without intravenous contrast. COMPARISON:  None. FINDINGS: Brain: No evidence of acute infarction, hemorrhage, hydrocephalus, extra-axial collection or mass lesion/mass effect. Vascular: No hyperdense vessel or unexpected calcification. Skull: Normal. Negative for fracture or focal lesion. Sinuses/Orbits: Postsurgical changes are noted in the maxillary antra bilaterally. Mucosal thickening is noted within the ethmoid and frontal sinuses. Other: None. IMPRESSION: Changes of chronic sinusitis. No acute intracranial abnormality noted. Electronically Signed   By: Inez Catalina M.D.   On: 03/05/2020 13:32   CT ABDOMEN PELVIS W CONTRAST  Result Date: 03/05/2020 CLINICAL DATA:  Nausea and vomiting.  Abdominal pain. EXAM: CT ABDOMEN AND PELVIS WITH CONTRAST TECHNIQUE: Multidetector CT imaging of the abdomen and pelvis was performed using the standard protocol following bolus administration of intravenous contrast. CONTRAST:  189mL OMNIPAQUE IOHEXOL 300 MG/ML  SOLN COMPARISON:  CT abdomen pelvis 11/24/2019. FINDINGS: Lower chest: Normal heart size. Patchy areas of consolidation right lower lobe. No pleural effusion. Hepatobiliary: Liver is diffusely low in attenuation compatible with steatosis. Prior cholecystectomy. Mild intrahepatic and extrahepatic biliary ductal dilatation. Common bile duct is dilated to the level of the pancreatic mass. Pancreas: The pancreatic duct remains dilated measuring up to 6 mm. Slight interval increase in size of ill-defined mass within the region the pancreatic head/uncinate process  measuring 3.5 x 3.7 cm. 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 Minimal peripancreatic fat stranding. Redemonstrated portosystemic collateral vessels within the upper abdomen. Spleen: Unremarkable. Adrenals/Urinary Tract: Normal adrenal glands. Kidneys enhance symmetrically with contrast. Partially exophytic cyst mid pole left kidney. Additional subcentimeter too small to characterize low-attenuation renal lesions. Urinary bladder is unremarkable. Stomach/Bowel: Pancolonic diverticulosis. No CT evidence for acute diverticulitis. Normal morphology of the stomach. Vascular/Lymphatic: Normal caliber abdominal aorta. Peripheral calcified atherosclerotic plaque. Reproductive: Prior hysterectomy. Other: None. Musculoskeletal: Multiple unchanged compression fractures involving the lower thoracic and lumbar spine. No aggressive appearing osseous lesions. IMPRESSION: 1. Slight interval increase in size of ill-defined mass within the region of the pancreatic head/uncinate  process measuring up to 3.5 cm. 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 2. Hepatic steatosis. 3. Patchy areas of consolidation right lower lobe which may represent infection. Recommend continued attention on follow-up to ensure resolution. 4. Pancolonic diverticulosis without CT evidence for acute diverticulitis. 5. Multiple unchanged compression fractures involving the lower thoracic and lumbar spine. 6. Aortic atherosclerosis. Electronically Signed   By: Lovey Newcomer M.D.   On: 03/05/2020 14:05    EKG: Independently reviewed.  Sinus rhythm at 67 bpm  Assessment/Plan Abdominal pain with nausea and vomiting secondary to pancreatic cancer: Patient presents with complaints of abdominal pain with persistent nausea and vomiting.  Noted to have increasing mass near pancreatic head measuring up to 3.5 cm causing compression on the portal vein.  Patient had not  been able to follow-up to have Port-A-Cath placed to start neoadjuvant as of yet.  Gastroenterology has been consulted and plan on ERCP with biliary decompression on 1/29. -Admit to medical telemetry -Clear liquid diet as tolerated -Normal saline IV fluids at 75 mL/h -Antiemetics as needed -Oxycodone/morphine as needed for moderate to severe pain respectively -Appreciate GI consultative services, we will follow-up for further recommendations -Message sent to Dr. Benay Spice regarding admission into the hospital  Essential hypertension: Blood pressures currently stable.  Currently stable patient appears to be in sinus rhythm at this time. -Continue diltiazem and ARB in a.m.  Diabetes mellitus type 2, uncontrolled: On admission glucose 151.  Last hemoglobin A1c was 11 on 11/17/2019. Patient home regimen includes Lantus 12 units nightly and NovoLog 3 units with meals. -Hypoglycemic protocols -CBGs before every meal with sensitive SSI -Continue to monitor and adjust regimen as needed  Transaminitis and hyperbilirubinemia: Acute on chronic.  Suspect secondary to above. -Continue to monitor  Severe protein calorie malnutrition -Check prealbumin in a.m. to verify  DNR: Patient confirmed with daughter present at bedside that she would like to have a DO NOT RESUSCITATE order in place.  DVT prophylaxis: Lovenox Code Status: DNR Family Communication: Daughter updated at bedside Disposition Plan: To be determined Consults called: Gastroenterology Admission status: Inpatient, require more than 2 midnight stay for need of procedure  Norval Morton MD [facility]   If 7PM-7AM, please contact night-coverage   03/05/2020, 2:58 PM

## 2020-03-05 NOTE — Consult Note (Addendum)
[facility] Consult: 2:21 PM 03/05/2020  LOS: 0 days    Referring Provider: Ethelene Browns in ED Primary Care Physician:  Lilian Coma., MD Primary Gastroenterologist:  Dr. Rush Landmark.      Reason for Consultation: Nausea, vomiting and abdominal pain in patient with pancreatic cancer.   HPI: Nancy Hawkins is a 80 y.o. female. [place] DM 2. Hypertension. GERD. DM. Previous abdominal hysterectomy, cholecystectomy. Remote colonoscopy peer  During admission in October 2021 had work-up of abdominal pain, weight loss CTAP: acute pancreatitis, 2.5 x 2.2 low-attenuation mass in the mid body of the pancreas abutting the celiac axis and splenic vein.   11/18/2019 MRI with extensive inflammation in the pancreas and multicystic fluid signal at the anterior pancreatic neck measuring 3.2 x 1.7 cm most consistent with a pseudocyst.  Numerous smaller fluid signal lesions throughout the pancreas.  Pancreatic duct measuring up to 8 mm.  Trace ascites.  Pleural effusions, atelectasis or consolidation in the lungs. 11/20/2019 colonoscopy. Tortuous colon. Stool throughout the examined colon, lavaged with adequate visualization. 5, 3 to 14 mm, polyps removed from the throughout the colon. 2 colonic AVMs. Pandiverticulosis. Nonbleeding external/internal hemorrhoids. 11/20/2019 EUS with FNA negative for malignancy but MD still concerned for pancreatic cancer. Mass at the pancreatic head and genu, tissue biopsy obtained. Endosonographic appearance suspicious for adenocarcinoma stage T2 N0 MX. Nonmalignant appearing lymph nodes in the celiac region, peripancreatic region, porta hepatis.  Endoscopically had white lesions suspicious for Candida. Hematin, coffee ground-like material throughout the stomach. Stomach adequately visualized following  copious lavage. Erosive gastritis. 14 mm, nonbleeding polyp in the gastric antrum this was resected usin mucosal resection technique endoclips applied to polypectomy site. G Developed pancreatitis following EUS. 11/24/2019 CT scan multiple developing pseudocysts/abscesses increased in size compared with previous, acute pancreatitis.   11/29/2019 01/20/2020 EUS.  Endoscopically:  2 cm hiatal hernia, medium sized post mucosectomy scar appeared healthy. Region of mass at the pancreatic head and genu, tissue biopsy obtained. Endosonographic appearance suspicious for adenocarcinoma stage T2 N0 MX. Nonmalignant appearing lymph nodes in region of celiac, peripancreatic, porta hepatis.   Cytology: Malignant cells, adenocarcinoma. 01/29/2020 staging CT of pelvis: No evidence for pelvic mets. CA 19-9 was 228 in October, 512 in mid December 2021.  Met with oncology, Dr Ammie Dalton, and Dr. Michaelle Birks.  Dr. [name] recommended neoadjuvant chemotherapy prior to surgical resection.  Tumor is described as borderline resectable. Plan was to initiate FOLFOX, add irinotecan. Tested positive for COVID-19 on 1/3 which delayed placement of Port-A-Cath and start of chemo.    In mid January was having diarrhea that resolved after stopping oral iron.  Regarding the Covid diagnosis for about a week she had cough, cold symptoms that got better for about a week.  Then she had several days of malaise, myalgias, flulike symptoms that resolved.  For about a week she has had loose stools soft gray-colored stools.  She will have several small stools in a short period of time amounting to a moderate to large volume of stool early in the morning and then may  have no bowel movements for the rest of the day until the next morning.  She is having abdominal pain in her lower abdomen.  When she presented in October the pain was in her back not in the abdomen.  She has had nausea, vomiting, dry heaves.  No bloody emesis.  Unable to keep down  p.o. liquids and solids.  Has been watching her blood sugars and not using insulin as prescribed to avoid hypoglycemia.  Urine is decreased volume, frequency and dark in color. Went to urgent care on 1/25, labs obtained showing elevated LFTs.  Abided with prescription for Zofran.  Advised to follow-up with hospice, palliative care. She has an appointment next Tuesday for insertion of the Port-A-Cath and plans to initiate chemotherapy that Thursday.  Due to ongoing GI symptoms, she has become very weak and came to the ED today. BPs in the 130s to 150s/50s to 60s.  Heart rates 60s to 70s.  Room air sats low 90s to 100%.  No fever.  CTAP w contrast: Interval increase to size of mass at pancreatic head/uncinate process, now measuring up to 3.5 cm. Associated marked narrowing at adjacent portal vein. Hepatic steatosis. Patchy right lower lobe consolidation, possible infection? Pancolonic diverticulosis. Multiple, stable lower thoracic/lumbar spine compression fractures. Aortic atherosclerosis.  T bili 4.0.  Alkaline phosphatase 748.  AST/ALT 267/318.  These are all significantly higher than they were in mid December and slightly improved from 2 days ago. Lipase 12. BUN/creatinine, potassium, WBCs okay. Hb 11.3.  INR 1.2.     Past Medical History:  Diagnosis Date  . Diabetes mellitus without complication ([place])    type 2  . Dysrhythmia    a-fib 15 yrs ago per patient on 01/17/20  . GERD (gastroesophageal reflux disease)   . Hypercholesteremia   . Hypertension   . Pancreatic cancer ([place])   . Pancreatic mass   . Pancreatitis    Hx  . Seasonal allergies     Past Surgical History:  Procedure Laterality Date  . ABDOMINAL HYSTERECTOMY    . BIOPSY  11/20/2019   Procedure: BIOPSY;  Surgeon: Rush Landmark Telford Nab., MD;  Location: [facility];  Service: Gastroenterology;;  . CHOLECYSTECTOMY    . COLONOSCOPY WITH PROPOFOL N/A 11/20/2019   Procedure: COLONOSCOPY WITH PROPOFOL;  Surgeon:  Rush Landmark Telford Nab., MD;  Location: [facility];  Service: Gastroenterology;  Laterality: N/A;  . ENDOSCOPIC MUCOSAL RESECTION  11/20/2019   Procedure: ENDOSCOPIC MUCOSAL RESECTION;  Surgeon: Rush Landmark Telford Nab., MD;  Location: [facility];  Service: Gastroenterology;;  . ESOPHAGOGASTRODUODENOSCOPY (EGD) WITH PROPOFOL N/A 11/20/2019   Procedure: ESOPHAGOGASTRODUODENOSCOPY (EGD) WITH PROPOFOL;  Surgeon: Irving Copas., MD;  Location: [place];  Service: Gastroenterology;  Laterality: N/A;  . ESOPHAGOGASTRODUODENOSCOPY (EGD) WITH PROPOFOL N/A 01/20/2020   Procedure: ESOPHAGOGASTRODUODENOSCOPY (EGD) WITH PROPOFOL;  Surgeon: Rush Landmark Telford Nab., MD;  Location: [place];  Service: Gastroenterology;  Laterality: N/A;  . EUS N/A 01/20/2020   Procedure: UPPER ENDOSCOPIC ULTRASOUND (EUS) RADIAL;  Surgeon: Rush Landmark Telford Nab., MD;  Location: [facility];  Service: Gastroenterology;  Laterality: N/A;  . FINE NEEDLE ASPIRATION  11/20/2019   Procedure: FINE NEEDLE ASPIRATION (FNA) LINEAR;  Surgeon: Irving Copas., MD;  Location: [place];  Service: Gastroenterology;;  . FINE NEEDLE ASPIRATION BIOPSY  01/20/2020   Procedure: FINE NEEDLE ASPIRATION BIOPSY;  Surgeon: Rush Landmark Telford Nab., MD;  Location: [place];  Service: Gastroenterology;;  . HEMOSTASIS CLIP PLACEMENT  11/20/2019   Procedure: HEMOSTASIS CLIP PLACEMENT;  Surgeon: Irving Copas., MD;  Location: [facility];  Service: Gastroenterology;;  . NASAL SINUS SURGERY    . POLYPECTOMY  11/20/2019   Procedure: POLYPECTOMY;  Surgeon: Mansouraty, Telford Nab., MD;  Location: [place];  Service: Gastroenterology;;  . Lia Foyer LIFTING INJECTION  11/20/2019   Procedure: SUBMUCOSAL LIFTING INJECTION;  Surgeon: Irving Copas., MD;  Location: [place];  Service: Gastroenterology;;  . UPPER ESOPHAGEAL ENDOSCOPIC ULTRASOUND (EUS) N/A 11/20/2019   Procedure: UPPER ESOPHAGEAL  ENDOSCOPIC ULTRASOUND (EUS);  Surgeon: Irving Copas., MD;  Location: [place];  Service: Gastroenterology;  Laterality: N/A;    Prior to Admission medications   Medication Sig Start Date End Date Taking? Authorizing Provider  aspirin EC 81 MG tablet Take 81 mg by mouth daily. Swallow whole.    [provider]  cholecalciferol (VITAMIN D) 25 MCG (1000 UNIT) tablet Take 1,000 Units by mouth daily.    [provider]  diltiazem (TIAZAC) 120 MG 24 hr capsule Take 120 mg by mouth daily.    [provider]  Ensure Max Protein (ENSURE MAX PROTEIN) LIQD Take 330 mLs (11 oz total) by mouth 2 (two) times daily with a meal. Patient taking differently: Take 11 oz by mouth daily. 11/28/19   Geradine Girt, DO  famotidine (PEPCID) 40 MG tablet Take 40 mg by mouth daily as needed for heartburn or indigestion.    [provider]  fexofenadine (ALLEGRA) 180 MG tablet Take 180 mg by mouth daily.    [provider]  guaiFENesin (MUCINEX) 600 MG 12 hr tablet Take 600 mg by mouth at bedtime.    [provider]  insulin aspart (NOVOLOG) 100 UNIT/ML FlexPen Inject 3 Units into the skin 3 (three) times daily with meals. Patient taking differently: Inject 3 Units into the skin 3 (three) times daily before meals. 11/28/19   Geradine Girt, DO  Insulin Glargine (BASAGLAR KWIKPEN) 100 UNIT/ML Inject 12 Units into the skin daily. Patient taking differently: Inject 12 Units into the skin at bedtime. 11/28/19   Geradine Girt, DO  lidocaine-prilocaine (EMLA) cream Apply 1 application topically as needed. Apply over port 45 minutes prior to having port accessed for chemotherapy treatment. 02/03/20   Ladell Pier, MD  Multiple Vitamins-Minerals (MULTIVITAMIN WITH MINERALS) tablet Take 1 tablet by mouth daily.    [provider]  Multiple Vitamins-Minerals (OCUVITE EYE HEALTH FORMULA PO) Take 1 tablet by mouth daily.    [provider]   ondansetron (ZOFRAN ODT) 4 MG disintegrating tablet Take 1 tablet (4 mg total) by mouth every 8 (eight) hours as needed for nausea or vomiting. 03/03/20   Hall-Potvin, [place], PA-C  ondansetron (ZOFRAN) 8 MG tablet Take 1 tablet (8 mg total) by mouth every 8 (eight) hours as needed for nausea or vomiting. 02/03/20   Ladell Pier, MD  Probiotic Product (PROBIOTIC DAILY) CAPS Take 1 tablet by mouth daily.    [provider]  prochlorperazine (COMPAZINE) 10 MG tablet Take 1 tablet (10 mg total) by mouth every 6 (six) hours as needed for nausea or vomiting. 02/03/20   Ladell Pier, MD  prochlorperazine (COMPAZINE) 10 MG tablet Take 1 tablet (10 mg total) by mouth every 8 (eight) hours as needed for nausea or vomiting. 03/05/20   Mansouraty, Telford Nab., MD  telmisartan (MICARDIS) 40 MG tablet Take 40 mg by mouth daily.    [provider]  traMADol (ULTRAM) 50 MG tablet Take 1 tablet (50 mg total) by mouth every 8 (eight) hours as needed  for moderate pain. 01/20/20   Mansouraty, Telford Nab., MD  triamcinolone (NASACORT) 55 MCG/ACT AERO nasal inhaler Place 2 sprays into the nose daily as needed (Congestion).    [provider]    Scheduled Meds:  Infusions:  PRN Meds:    Allergies as of 03/05/2020 - Review Complete 03/05/2020  Allergen Reaction Noted  . Cephalosporins Shortness Of Breath 11/17/2019  . Erythromycin Shortness Of Breath 11/17/2019  . Fire Manufacturing systems engineer Shortness Of Breath 01/10/2020  . Penicillins Anaphylaxis 11/17/2019    Family History  Family history unknown: Yes    Social History   Socioeconomic History  . Marital status: Married    Spouse name: Not on file  . Number of children: Not on file  . Years of education: Not on file  . Highest education level: Not on file  Occupational History  . Not on file  Tobacco Use  . Smoking status: Never Smoker  . Smokeless tobacco: Never Used  Vaping Use  . Vaping Use: Never used  Substance and  Sexual Activity  . Alcohol use: Not Currently  . Drug use: Never  . Sexual activity: Not on file    Comment: Hysterectomy  Other Topics Concern  . Not on file  Social History Narrative  . Not on file   Social Determinants of Health   Financial Resource Strain: Not on file  Food Insecurity: No Food Insecurity  . Worried About Charity fundraiser in the Last Year: Never true  . Ran Out of Food in the Last Year: Never true  Transportation Needs: No Transportation Needs  . Lack of Transportation (Medical): No  . Lack of Transportation (Non-Medical): No  Physical Activity: Not on file  Stress: Not on file  Social Connections: Not on file  Intimate Partner Violence: Not on file    REVIEW OF SYSTEMS: Constitutional: Weakness. ENT:  No nose bleeds Pulm: Cough, occasionally productive of nonpurulent sputum.  No dyspnea at rest. CV:  No palpitations, no LE edema.  No angina GU:  No hematuria, no frequency GI: See HPI. Heme: No unusual or excessive bleeding or bruising. Transfusions: None Neuro:  No headaches, no peripheral tingling or numbness Derm:  No itching, no rash or sores.  Endocrine:  No sweats or chills.  No polyuria or dysuria Immunization: Although she was vaccinated for the flu, she decided she did not want to be vaccinated for COVID-19. Travel:  None beyond local counties in last few months.    PHYSICAL EXAM: Vital signs in last 24 hours: Vitals:   03/05/20 1230 03/05/20 1245  BP: (!) 133/58 (!) 130/55  Pulse: 70 63  Resp: 15 17  Temp:    SpO2: 97% 96%   Wt Readings from Last 3 Encounters:  03/05/20 58.5 kg  01/28/20 62.1 kg  01/20/20 62.1 kg    General: Looks ill, laying in Biomedical scientist, heaving. Head: No facial asymmetry or swelling.  No signs of head trauma. Eyes: No icterus.  No conjunctival pallor Ears: Not hard of hearing Nose: No congestion or discharge Mouth: Somewhat dry oral mucosa.  Tongue midline.  Fair dentition. Neck: No JVD, no masses, no  thyromegaly Lungs: Clear bilaterally.  No labored breathing.  Mucoid cough. Heart: RRR.  No MRG.  S1, S2 present Abdomen: Soft, nontender, nondistended.  No HSM, masses, bruits, hernias.  Bowel sounds active..   Rectal: Deferred Musc/Skeltl: No joint redness, swelling or gross deformity. Extremities: No CCE. Neurologic: Oriented x3.  Moves all 4 limbs without tremor.  Strength not tested. Skin: No rash, no sores, no telangiectasia, no jaundice. Nodes: No cervical adenopathy Psych: Flat affect, fluid speech.  Cooperative.  Calm.  Intake/Output from previous day: No intake/output data recorded. Intake/Output this shift: No intake/output data recorded.  LAB RESULTS: Recent Labs    03/05/20 0857  WBC 6.2  HGB 11.3*  HCT 35.3*  PLT 263   BMET Lab Results  Component Value Date   NA 139 03/05/2020   NA 136 03/03/2020   NA 138 01/24/2020   K 3.7 03/05/2020   K 3.8 03/03/2020   K 3.9 01/24/2020   CL 106 03/05/2020   CL 103 03/03/2020   CL 108 01/24/2020   CO2 21 (L) 03/05/2020   CO2 20 03/03/2020   CO2 22 01/24/2020   GLUCOSE 151 (H) 03/05/2020   GLUCOSE 111 (H) 03/03/2020   GLUCOSE 224 (H) 01/24/2020   BUN 14 03/05/2020   BUN 12 03/03/2020   BUN 16 01/24/2020   CREATININE 0.63 03/05/2020   CREATININE 0.46 (L) 03/03/2020   CREATININE 0.60 01/24/2020   CALCIUM 8.4 (L) 03/05/2020   CALCIUM 8.4 (L) 03/03/2020   CALCIUM 9.1 01/24/2020   LFT Recent Labs    03/03/20 1801 03/05/20 0857  PROT 6.1 6.4*  ALBUMIN 3.1* 2.6*  AST 364* 267*  ALT 331* 318*  ALKPHOS 853* 748*  BILITOT 1.9* 4.0*   PT/INR Lab Results  Component Value Date   INR 1.2 03/05/2020   Hepatitis Panel Recent Labs    03/05/20 0942  HEPBSAG NON REACTIVE  HCVAB NON REACTIVE  HEPAIGM Reactive*  HEPBIGM NON REACTIVE   C-Diff No components found for: CDIFF Lipase     Component Value Date/Time   LIPASE 12 03/05/2020 0857    Drugs of Abuse  No results found for: LABOPIA, COCAINSCRNUR,  LABBENZ, AMPHETMU, THCU, LABBARB   RADIOLOGY STUDIES: CT Head Wo Contrast  Result Date: 03/05/2020 CLINICAL DATA:  History of pancreatic carcinoma EXAM: CT HEAD WITHOUT CONTRAST TECHNIQUE: Contiguous axial images were obtained from the base of the skull through the vertex without intravenous contrast. COMPARISON:  None. FINDINGS: Brain: No evidence of acute infarction, hemorrhage, hydrocephalus, extra-axial collection or mass lesion/mass effect. Vascular: No hyperdense vessel or unexpected calcification. Skull: Normal. Negative for fracture or focal lesion. Sinuses/Orbits: Postsurgical changes are noted in the maxillary antra bilaterally. Mucosal thickening is noted within the ethmoid and frontal sinuses. Other: None. IMPRESSION: Changes of chronic sinusitis. No acute intracranial abnormality noted. Electronically Signed   By: Inez Catalina M.D.   On: 03/05/2020 13:32   CT ABDOMEN PELVIS W CONTRAST  Result Date: 03/05/2020 CLINICAL DATA:  Nausea and vomiting.  Abdominal pain. EXAM: CT ABDOMEN AND PELVIS WITH CONTRAST TECHNIQUE: Multidetector CT imaging of the abdomen and pelvis was performed using the standard protocol following bolus administration of intravenous contrast. CONTRAST:  166mL OMNIPAQUE IOHEXOL 300 MG/ML  SOLN COMPARISON:  CT abdomen pelvis 11/24/2019. FINDINGS: Lower chest: Normal heart size. Patchy areas of consolidation right lower lobe. No pleural effusion. Hepatobiliary: Liver is diffusely low in attenuation compatible with steatosis. Prior cholecystectomy. Mild intrahepatic and extrahepatic biliary ductal dilatation. Common bile duct is dilated to the level of the pancreatic mass. Pancreas: The pancreatic duct remains dilated measuring up to 6 mm. Slight interval increase in size of ill-defined mass within the region the pancreatic head/uncinate process measuring 3.5 x 3.7 cm. 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 Minimal peripancreatic fat stranding. Redemonstrated  portosystemic collateral vessels within the upper abdomen. Spleen:  Unremarkable. Adrenals/Urinary Tract: Normal adrenal glands. Kidneys enhance symmetrically with contrast. Partially exophytic cyst mid pole left kidney. Additional subcentimeter too small to characterize low-attenuation renal lesions. Urinary bladder is unremarkable. Stomach/Bowel: Pancolonic diverticulosis. No CT evidence for acute diverticulitis. Normal morphology of the stomach. Vascular/Lymphatic: Normal caliber abdominal aorta. Peripheral calcified atherosclerotic plaque. Reproductive: Prior hysterectomy. Other: None. Musculoskeletal: Multiple unchanged compression fractures involving the lower thoracic and lumbar spine. No aggressive appearing osseous lesions. IMPRESSION: 1. Slight interval increase in size of ill-defined mass within the region of the pancreatic head/uncinate process measuring up to 3.5 cm. 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 2. Hepatic steatosis. 3. Patchy areas of consolidation right lower lobe which may represent infection. Recommend continued attention on follow-up to ensure resolution. 4. Pancolonic diverticulosis without CT evidence for acute diverticulitis. 5. Multiple unchanged compression fractures involving the lower thoracic and lumbar spine. 6. Aortic atherosclerosis. Electronically Signed   By: Lovey Newcomer M.D.   On: 03/05/2020 14:05     IMPRESSION:   *   Pancreatic cancer.   Now w lower abd pain, n/v and increasing size of lesion per CT which is causing mass effect on PV.   Initiation of neoadjuvant chemotherapy delayed due to testing COVID-19 positive which delayed Port-A-Cath placement, rescheduled for next week along with initiation of chemotherapy.. Dr. Michaelle Birks had planned surgical resection.  *    RLL patchy consolidation.  Tested positive for Covid 19 02/10/2020.  *   IDDM.  Current serum glucose  151.   PLAN:     *    IVF.  Antiemetics, analgesics.  Sips and ice chips as tolerated. Dr. Henrene Pastor will see patient.  No plans to schedule endoscopic evaluations at this point.   Azucena Freed  03/05/2020, 2:21 PM Phone [phone]  GI ATTENDING  History, laboratories, x-rays reviewed.  Endoscopic ultrasound reports and pathology reviewed.  Agree with comprehensive consultation note as outlined above by Azucena Freed, PA-C.  This is a patient with known pancreatic cancer.  It is now progressed to cause biliary obstruction.  No cholangitis.  Problems with pain may be secondary to progressive cancer.  Problems with nausea and vomiting are concerning for possible gastric outlet obstruction.  Recent Covid positivity noted.  She has not started neoadjuvant chemotherapy.  The next step would be ERCP with biliary decompression (prior to her starting her chemotherapy).  If this is confounded by duodenal obstruction, then PTC would be recommended.  Tomorrow's endoscopy schedule does not allow for scheduling of this procedure.  I suspect she will be scheduled for day after tomorrow.  In the interim, I would limit her diet to clear liquids and provide antiemetics as needed.  Make sure she is well-hydrated.  We will follow.  Docia Chuck. Geri Seminole., M.D. [facility] Division of Gastroenterology

## 2020-03-05 NOTE — ED Notes (Signed)
Pt requested and given ice chips 

## 2020-03-05 NOTE — ED Triage Notes (Signed)
Pt coming from home. Complaint of n/v and abnormal labs. Patient states she was supposed to have a CT scan today but did not feel well enough to have it. VSS.

## 2020-03-05 NOTE — ED Notes (Signed)
Report given to floor Theresia Lo, RN

## 2020-03-05 NOTE — ED Provider Notes (Signed)
[facility] EMERGENCY DEPARTMENT Provider Note   CSN: [id number] Arrival date & time: 03/05/20  4098     History Chief Complaint  Patient presents with  . Nausea  . Emesis  . abnormal labs    Nancy Hawkins is a 80 y.o. female presented for evaluation nausea, vomiting, abdominal pain.  Patient states he was diagnosed with pancreatic cancer in November.  For the past week, she has been feeling tired and having soft gray stools.  For the past 3 days, she has had severe nausea, vomit, abdominal pain.  Unable to tolerate any p.o.  She seen in urgent care yesterday, told her labs look bad, especially her liver function.  There is concerned that she may have blockage of her pancreatic/biliary duct.  She was placed with a CT today, but due to feeling poorly, came to the ER.  She reports chills, no fevers.  No chest pain or shortness of breath.  She was diagnosed with Covid earlier in the month, however does not have any Covid symptoms currently.  She has not yet started chemo, supposed to have a Port-A-Cath placed next week and chemo starting on 2/7.   Additional history obtained from chart review.  Reviewed labs from yesterday, which showed elevation in LFTs and bili.  HPI     Past Medical History:  Diagnosis Date  . Diabetes mellitus without complication ([place])    type 2  . Dysrhythmia    a-fib 15 yrs ago per patient on 01/17/20  . GERD (gastroesophageal reflux disease)   . Hypercholesteremia   . Hypertension   . Pancreatic cancer ([place])   . Pancreatic mass   . Pancreatitis    Hx  . Seasonal allergies     Patient Active Problem List   Diagnosis Date Noted  . Primary cancer of head of pancreas ([place]) 01/29/2020  . Nausea   . Acute pancreatitis   . Abnormal CT scan, colon   . DKA (diabetic ketoacidosis) ([place]) 11/17/2019  . Pancreatic mass 11/17/2019    Past Surgical History:  Procedure Laterality Date  . ABDOMINAL HYSTERECTOMY    . BIOPSY  11/20/2019    Procedure: BIOPSY;  Surgeon: Rush Landmark Telford Nab., MD;  Location: [place];  Service: Gastroenterology;;  . CHOLECYSTECTOMY    . COLONOSCOPY WITH PROPOFOL N/A 11/20/2019   Procedure: COLONOSCOPY WITH PROPOFOL;  Surgeon: Rush Landmark Telford Nab., MD;  Location: [place];  Service: Gastroenterology;  Laterality: N/A;  . ENDOSCOPIC MUCOSAL RESECTION  11/20/2019   Procedure: ENDOSCOPIC MUCOSAL RESECTION;  Surgeon: Rush Landmark Telford Nab., MD;  Location: [place];  Service: Gastroenterology;;  . ESOPHAGOGASTRODUODENOSCOPY (EGD) WITH PROPOFOL N/A 11/20/2019   Procedure: ESOPHAGOGASTRODUODENOSCOPY (EGD) WITH PROPOFOL;  Surgeon: Irving Copas., MD;  Location: [place];  Service: Gastroenterology;  Laterality: N/A;  . ESOPHAGOGASTRODUODENOSCOPY (EGD) WITH PROPOFOL N/A 01/20/2020   Procedure: ESOPHAGOGASTRODUODENOSCOPY (EGD) WITH PROPOFOL;  Surgeon: Rush Landmark Telford Nab., MD;  Location: [place];  Service: Gastroenterology;  Laterality: N/A;  . EUS N/A 01/20/2020   Procedure: UPPER ENDOSCOPIC ULTRASOUND (EUS) RADIAL;  Surgeon: Rush Landmark Telford Nab., MD;  Location: [place];  Service: Gastroenterology;  Laterality: N/A;  . FINE NEEDLE ASPIRATION  11/20/2019   Procedure: FINE NEEDLE ASPIRATION (FNA) LINEAR;  Surgeon: Irving Copas., MD;  Location: [place];  Service: Gastroenterology;;  . FINE NEEDLE ASPIRATION BIOPSY  01/20/2020   Procedure: FINE NEEDLE ASPIRATION BIOPSY;  Surgeon: Rush Landmark Telford Nab., MD;  Location: [place];  Service: Gastroenterology;;  . [place]  11/20/2019   Procedure: HEMOSTASIS CLIP PLACEMENT;  Surgeon: Irving Copas., MD;  Location: [place];  Service: Gastroenterology;;  . NASAL SINUS SURGERY    . POLYPECTOMY  11/20/2019   Procedure: POLYPECTOMY;  Surgeon: Mansouraty, Telford Nab., MD;  Location: [place];  Service: Gastroenterology;;  . Lia Foyer LIFTING INJECTION  11/20/2019    Procedure: SUBMUCOSAL LIFTING INJECTION;  Surgeon: Irving Copas., MD;  Location: [place];  Service: Gastroenterology;;  . UPPER ESOPHAGEAL ENDOSCOPIC ULTRASOUND (EUS) N/A 11/20/2019   Procedure: UPPER ESOPHAGEAL ENDOSCOPIC ULTRASOUND (EUS);  Surgeon: Irving Copas., MD;  Location: [place];  Service: Gastroenterology;  Laterality: N/A;     OB History   No obstetric history on file.     Family History  Family history unknown: Yes    Social History   Tobacco Use  . Smoking status: Never Smoker  . Smokeless tobacco: Never Used  Vaping Use  . Vaping Use: Never used  Substance Use Topics  . Alcohol use: Not Currently  . Drug use: Never    Home Medications Prior to Admission medications   Medication Sig Start Date End Date Taking? Authorizing Provider  aspirin EC 81 MG tablet Take 81 mg by mouth daily. Swallow whole.    [provider]  cholecalciferol (VITAMIN D) 25 MCG (1000 UNIT) tablet Take 1,000 Units by mouth daily.    [provider]  diltiazem (TIAZAC) 120 MG 24 hr capsule Take 120 mg by mouth daily.    [provider]  Ensure Max Protein (ENSURE MAX PROTEIN) LIQD Take 330 mLs (11 oz total) by mouth 2 (two) times daily with a meal. Patient taking differently: Take 11 oz by mouth daily. 11/28/19   Geradine Girt, DO  famotidine (PEPCID) 40 MG tablet Take 40 mg by mouth daily as needed for heartburn or indigestion.    [provider]  fexofenadine (ALLEGRA) 180 MG tablet Take 180 mg by mouth daily.    [provider]  guaiFENesin (MUCINEX) 600 MG 12 hr tablet Take 600 mg by mouth at bedtime.    [provider]  insulin aspart (NOVOLOG) 100 UNIT/ML FlexPen Inject 3 Units into the skin 3 (three) times daily with meals. Patient taking differently: Inject 3 Units into the skin 3 (three) times daily before meals. 11/28/19   Geradine Girt, DO  Insulin Glargine (BASAGLAR KWIKPEN) 100 UNIT/ML Inject 12  Units into the skin daily. Patient taking differently: Inject 12 Units into the skin at bedtime. 11/28/19   Geradine Girt, DO  lidocaine-prilocaine (EMLA) cream Apply 1 application topically as needed. Apply over port 45 minutes prior to having port accessed for chemotherapy treatment. 02/03/20   Ladell Pier, MD  Multiple Vitamins-Minerals (MULTIVITAMIN WITH MINERALS) tablet Take 1 tablet by mouth daily.    [provider]  Multiple Vitamins-Minerals (OCUVITE EYE HEALTH FORMULA PO) Take 1 tablet by mouth daily.    [provider]  ondansetron (ZOFRAN ODT) 4 MG disintegrating tablet Take 1 tablet (4 mg total) by mouth every 8 (eight) hours as needed for nausea or vomiting. 03/03/20   Hall-Potvin, [place], PA-C  ondansetron (ZOFRAN) 8 MG tablet Take 1 tablet (8 mg total) by mouth every 8 (eight) hours as needed for nausea or vomiting. 02/03/20   Ladell Pier, MD  Probiotic Product (PROBIOTIC DAILY) CAPS Take 1 tablet by mouth daily.    [provider]  prochlorperazine (COMPAZINE) 10 MG tablet Take 1 tablet (10 mg total) by mouth every  6 (six) hours as needed for nausea or vomiting. 02/03/20   Ladell Pier, MD  prochlorperazine (COMPAZINE) 10 MG tablet Take 1 tablet (10 mg total) by mouth every 8 (eight) hours as needed for nausea or vomiting. 03/05/20   Mansouraty, Telford Nab., MD  telmisartan (MICARDIS) 40 MG tablet Take 40 mg by mouth daily.    [provider]  traMADol (ULTRAM) 50 MG tablet Take 1 tablet (50 mg total) by mouth every 8 (eight) hours as needed for moderate pain. 01/20/20   Mansouraty, Telford Nab., MD  triamcinolone (NASACORT) 55 MCG/ACT AERO nasal inhaler Place 2 sprays into the nose daily as needed (Congestion).    [provider]    Allergies    Cephalosporins, Erythromycin, Fire ant, and Penicillins  Review of Systems   Review of Systems  Gastrointestinal: Positive for abdominal pain, diarrhea, nausea and vomiting.   Neurological: Positive for weakness.  All other systems reviewed and are negative.   Physical Exam Updated Vital Signs BP (!) 146/63   Pulse 72   Temp 97.7 F (36.5 C) (Oral)   Resp (!) 23   Ht 5' 3.5" (1.613 m)   Wt 58.5 kg   LMP  (LMP Unknown)   SpO2 100%   BMI 22.49 kg/m   Physical Exam Vitals and nursing note reviewed.  Constitutional:      General: She is not in acute distress.    Appearance: She is well-developed and well-nourished.  HENT:     Head: Normocephalic and atraumatic.     Mouth/Throat:     Mouth: Mucous membranes are dry.     Comments: MM dry.  Patient with small white spots on the palate/oropharynx.  Not consistent with strep, no tonsillar swelling or exudate. Eyes:     Extraocular Movements: Extraocular movements intact and EOM normal.     Conjunctiva/sclera: Conjunctivae normal.     Pupils: Pupils are equal, round, and reactive to light.  Cardiovascular:     Rate and Rhythm: Normal rate and regular rhythm.     Pulses: Normal pulses and intact distal pulses.  Pulmonary:     Effort: Pulmonary effort is normal. No respiratory distress.     Breath sounds: Normal breath sounds. No wheezing.  Abdominal:     General: There is no distension.     Palpations: Abdomen is soft. There is no mass.     Tenderness: There is abdominal tenderness. There is no guarding or rebound.     Comments: Tenderness palpation of the epigastric abdomen.  No rigidity or distention.  Bowel sounds normoactive.  Musculoskeletal:        General: Normal range of motion.     Cervical back: Normal range of motion and neck supple.  Skin:    General: Skin is warm and dry.     Capillary Refill: Capillary refill takes less than 2 seconds.  Neurological:     Mental Status: She is alert and oriented to person, place, and time.  Psychiatric:        Mood and Affect: Mood and affect normal.     ED Results / Procedures / Treatments   Labs (all labs ordered are listed, but only abnormal  results are displayed) Labs Reviewed  COMPREHENSIVE METABOLIC PANEL - Abnormal; Notable for the following components:      Result Value   CO2 21 (*)    Glucose, Bld 151 (*)    Calcium 8.4 (*)    Total Protein 6.4 (*)  Albumin 2.6 (*)    AST 267 (*)    ALT 318 (*)    Alkaline Phosphatase 748 (*)    Total Bilirubin 4.0 (*)    All other components within normal limits  CBC - Abnormal; Notable for the following components:   Hemoglobin 11.3 (*)    HCT 35.3 (*)    All other components within normal limits  URINALYSIS, ROUTINE W REFLEX MICROSCOPIC - Abnormal; Notable for the following components:   Color, Urine AMBER (*)    APPearance HAZY (*)    Bilirubin Urine SMALL (*)    Ketones, ur 20 (*)    All other components within normal limits  HEPATITIS PANEL, ACUTE - Abnormal; Notable for the following components:   Hep A IgM Reactive (*)    All other components within normal limits  CBG MONITORING, ED - Abnormal; Notable for the following components:   Glucose-Capillary 149 (*)    All other components within normal limits  CBG MONITORING, ED - Abnormal; Notable for the following components:   Glucose-Capillary 143 (*)    All other components within normal limits  LIPASE, BLOOD  PROTIME-INR  APTT  CBG MONITORING, ED    EKG EKG Interpretation  Date/Time:  Thursday March 05 2020 10:31:13 EST Ventricular Rate:  67 PR Interval:    QRS Duration: 93 QT Interval:  439 QTC Calculation: 464 R Axis:   67 Text Interpretation: Sinus rhythm NSR, no change from previous Confirmed by Lavenia Atlas [phone]) on 03/05/2020 10:54:26 AM   Radiology CT Head Wo Contrast  Result Date: 03/05/2020 CLINICAL DATA:  History of pancreatic carcinoma EXAM: CT HEAD WITHOUT CONTRAST TECHNIQUE: Contiguous axial images were obtained from the base of the skull through the vertex without intravenous contrast. COMPARISON:  None. FINDINGS: Brain: No evidence of acute infarction, hemorrhage, hydrocephalus,  extra-axial collection or mass lesion/mass effect. Vascular: No hyperdense vessel or unexpected calcification. Skull: Normal. Negative for fracture or focal lesion. Sinuses/Orbits: Postsurgical changes are noted in the maxillary antra bilaterally. Mucosal thickening is noted within the ethmoid and frontal sinuses. Other: None. IMPRESSION: Changes of chronic sinusitis. No acute intracranial abnormality noted. Electronically Signed   By: Inez Catalina M.D.   On: 03/05/2020 13:32   CT ABDOMEN PELVIS W CONTRAST  Result Date: 03/05/2020 CLINICAL DATA:  Nausea and vomiting.  Abdominal pain. EXAM: CT ABDOMEN AND PELVIS WITH CONTRAST TECHNIQUE: Multidetector CT imaging of the abdomen and pelvis was performed using the standard protocol following bolus administration of intravenous contrast. CONTRAST:  143mL OMNIPAQUE IOHEXOL 300 MG/ML  SOLN COMPARISON:  CT abdomen pelvis 11/24/2019. FINDINGS: Lower chest: Normal heart size. Patchy areas of consolidation right lower lobe. No pleural effusion. Hepatobiliary: Liver is diffusely low in attenuation compatible with steatosis. Prior cholecystectomy. Mild intrahepatic and extrahepatic biliary ductal dilatation. Common bile duct is dilated to the level of the pancreatic mass. Pancreas: The pancreatic duct remains dilated measuring up to 6 mm. Slight interval increase in size of ill-defined mass within the region the pancreatic head/uncinate process measuring 3.5 x 3.7 cm. 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 Minimal peripancreatic fat stranding. Redemonstrated portosystemic collateral vessels within the upper abdomen. Spleen: Unremarkable. Adrenals/Urinary Tract: Normal adrenal glands. Kidneys enhance symmetrically with contrast. Partially exophytic cyst mid pole left kidney. Additional subcentimeter too small to characterize low-attenuation renal lesions. Urinary bladder is unremarkable. Stomach/Bowel: Pancolonic diverticulosis. No CT evidence for acute  diverticulitis. Normal morphology of the stomach. Vascular/Lymphatic: Normal caliber abdominal aorta. Peripheral calcified atherosclerotic plaque. Reproductive:  Prior hysterectomy. Other: None. Musculoskeletal: Multiple unchanged compression fractures involving the lower thoracic and lumbar spine. No aggressive appearing osseous lesions. IMPRESSION: 1. Slight interval increase in size of ill-defined mass within the region of the pancreatic head/uncinate process measuring up to 3.5 cm. 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 2. Hepatic steatosis. 3. Patchy areas of consolidation right lower lobe which may represent infection. Recommend continued attention on follow-up to ensure resolution. 4. Pancolonic diverticulosis without CT evidence for acute diverticulitis. 5. Multiple unchanged compression fractures involving the lower thoracic and lumbar spine. 6. Aortic atherosclerosis. Electronically Signed   By: Lovey Newcomer M.D.   On: 03/05/2020 14:05    Procedures Procedures   Medications Ordered in ED Medications  sodium chloride 0.9 % bolus 1,000 mL (0 mLs Intravenous Stopped 03/05/20 1208)  morphine 4 MG/ML injection 4 mg (4 mg Intravenous Given 03/05/20 1104)  ondansetron (ZOFRAN) injection 4 mg (4 mg Intravenous Given 03/05/20 1104)  iohexol (OMNIPAQUE) 300 MG/ML solution 100 mL (100 mLs Intravenous Contrast Given 03/05/20 1323)  morphine 4 MG/ML injection 4 mg (4 mg Intravenous Given 03/05/20 1442)    ED Course  I have reviewed the triage vital signs and the nursing notes.  Pertinent labs & imaging results that were available during my care of the patient were reviewed by me and considered in my medical decision making (see chart for details).    MDM Rules/Calculators/A&P                          Patient presented for evaluation nausea, vomiting, abdominal pain in the setting of known pancreatic head cancer.  On  exam, patient appears as if she feels ill.  She does have epigastric tenderness.  She appears dehydrated.  In the setting of worsening symptoms and with elevation in LFTs yesterday, concern for pancreatic cancer causing obstruction.  Also consider viral illness, metastatic cancer.  We will repeat labs including coags and obtain CT abdomen pelvis.  Patient will likely need to be admitted.  Labs show elevated LFTs and elevated bili, even when compared to yesterday.  Concerning for obstruction.  CT head and abdomen pelvis pending.  On reassessment after pain and nausea medication, patient reports improvement of symptoms.  CT abdomen pelvis shows enlarging mass, biliary duct dilation, consistent with obstruction.  Will consult with GI.  Discussed with Delene Ruffini, PA-C from [facility], they will evaluate.  Discussed with Dr. Tamala Julian from [facility], pt to be admitted.   Final Clinical Impression(s) / ED Diagnoses Final diagnoses:  Elevated LFTs  Elevated bilirubin  Malignant neoplasm of pancreas, unspecified location of malignancy ([place])  Dilated bile duct    Rx / DC Orders ED Discharge Orders    None       Franchot Heidelberg, PA-C 03/05/20 1451    Horton, Alvin Critchley, DO 03/05/20 1925

## 2020-03-06 DIAGNOSIS — R1010 Upper abdominal pain, unspecified: Secondary | ICD-10-CM

## 2020-03-06 DIAGNOSIS — K831 Obstruction of bile duct: Secondary | ICD-10-CM | POA: Diagnosis not present

## 2020-03-06 DIAGNOSIS — C250 Malignant neoplasm of head of pancreas: Secondary | ICD-10-CM | POA: Diagnosis not present

## 2020-03-06 LAB — BASIC METABOLIC PANEL
Anion gap: 15 (ref 5–15)
BUN: 12 mg/dL (ref 8–23)
CO2: 17 mmol/L — ABNORMAL LOW (ref 22–32)
Calcium: 8.1 mg/dL — ABNORMAL LOW (ref 8.9–10.3)
Chloride: 106 mmol/L (ref 98–111)
Creatinine, Ser: 0.5 mg/dL (ref 0.44–1.00)
GFR, Estimated: >60 mL/min (ref >60)
Glucose, Bld: 143 mg/dL — ABNORMAL HIGH (ref 70–99)
Potassium: 3.7 mmol/L (ref 3.5–5.1)
Sodium: 138 mmol/L (ref 135–145)

## 2020-03-06 LAB — CBC
HCT: 33.2 % — ABNORMAL LOW (ref 36.0–46.0)
Hemoglobin: 10.9 g/dL — ABNORMAL LOW (ref 12.0–15.0)
MCH: 29.5 pg (ref 26.0–34.0)
MCHC: 32.8 g/dL (ref 30.0–36.0)
MCV: 89.7 fL (ref 80.0–100.0)
Platelets: 258 10*3/uL (ref 150–400)
RBC: 3.7 MIL/uL — ABNORMAL LOW (ref 3.87–5.11)
RDW: 15.7 % — ABNORMAL HIGH (ref 11.5–15.5)
WBC: 9 10*3/uL (ref 4.0–10.5)
nRBC: 0 % (ref 0.0–0.2)

## 2020-03-06 LAB — GLUCOSE, CAPILLARY
Glucose-Capillary: 130 mg/dL — ABNORMAL HIGH (ref 70–99)
Glucose-Capillary: 128 mg/dL — ABNORMAL HIGH (ref 70–99)
Glucose-Capillary: 186 mg/dL — ABNORMAL HIGH (ref 70–99)
Glucose-Capillary: 258 mg/dL — ABNORMAL HIGH (ref 70–99)
Glucose-Capillary: 202 mg/dL — ABNORMAL HIGH (ref 70–99)

## 2020-03-06 LAB — HEPATITIS PANEL, ACUTE
HCV Ab: NONREACTIVE
Hep A IgM: REACTIVE — AB
Hep B C IgM: NONREACTIVE
Hepatitis B Surface Ag: NONREACTIVE

## 2020-03-06 MED ORDER — ADULT MULTIVITAMIN W/MINERALS CH
1.0000 | ORAL_TABLET | Freq: Every day | ORAL | Status: DC
  Administered 2020-03-06 – 2020-03-12 (×6): 1 via ORAL
  Filled 2020-03-06 (×6): qty 1

## 2020-03-06 MED ORDER — IRBESARTAN 150 MG PO TABS
150.0000 mg | ORAL_TABLET | Freq: Every day | ORAL | Status: DC
  Administered 2020-03-06 – 2020-03-12 (×6): 150 mg via ORAL
  Filled 2020-03-06 (×6): qty 1

## 2020-03-06 MED ORDER — MELATONIN 3 MG PO TABS
3.0000 mg | ORAL_TABLET | Freq: Every day | ORAL | Status: DC
  Administered 2020-03-06 – 2020-03-11 (×7): 3 mg via ORAL
  Filled 2020-03-06 (×7): qty 1

## 2020-03-06 MED ORDER — MUSCLE RUB 10-15 % EX CREA
1.0000 "application " | TOPICAL_CREAM | CUTANEOUS | Status: DC | PRN
  Administered 2020-03-07 – 2020-03-11 (×2): 1 via TOPICAL
  Filled 2020-03-06: qty 85

## 2020-03-06 MED ORDER — DILTIAZEM HCL ER COATED BEADS 120 MG PO CP24
120.0000 mg | ORAL_CAPSULE | Freq: Every day | ORAL | Status: DC
  Administered 2020-03-06 – 2020-03-12 (×6): 120 mg via ORAL
  Filled 2020-03-06 (×6): qty 1

## 2020-03-06 MED ORDER — ENSURE ENLIVE PO LIQD: 237.0000 mL | Freq: Two times a day (BID) | ORAL | Status: DC

## 2020-03-06 MED ORDER — CIPROFLOXACIN IN D5W 400 MG/200ML IV SOLN
400.0000 mg | Freq: Once | INTRAVENOUS | Status: AC
  Administered 2020-03-07: 400 mg via INTRAVENOUS
  Filled 2020-03-06: qty 200

## 2020-03-06 MED ORDER — INDOMETHACIN 50 MG RE SUPP
100.0000 mg | Freq: Once | RECTAL | Status: DC
Start: 2020-03-07 — End: 2020-03-12
  Filled 2020-03-06: qty 2

## 2020-03-06 NOTE — Progress Notes (Signed)
PROGRESS NOTE  Nancy Hawkins:[id number] DOB: [birth year] DOA: 03/05/2020 PCP: Lilian Coma., MD  HPI/Recap of past 24 hours: Nancy Hawkins is a 80 y.o. female with medical history significant of hypertension, hyperlipidemia, A. Fib, diabetes mellitus type 2, pancreatic cancer diagnosed in October 2021 and recent COVID-19 infection on 02/10/2019 presents with complaints of abdominal pain with nausea and vomiting over the last week.  She complains of having lower abdominal pain that she describes as crampy in nature with to 2-3 loose stools per day that are gray-colored.  Patient reports being unable to keep any significant amount of food or liquids down over the last 2 to 3 days which gave her concern because she is a diabetic.  She was seen in urgent care and noted to have elevated liver enzymes 2 days ago's.  Due to having Covid-19 it delayed her starting chemotherapy treatment.  Plan is for her to have the Port-A-Cath placed next week to initiate chemotherapy.  Subjective March 06, 2020 Seen and examined at bedside she stated she does not have any more nausea vomiting or abdominal pain. She tolerated her full liquid diet well she ate crackers Jell-O and some juices he says she is doing well. Patient has history of  pancreatic cancer and they thinks he may have outlet obstruction Patient is scheduled for ERCP tomorrow She is also Covid positive since March 08, 2020    Assessment/Plan: Principal Problem:   Nausea & vomiting Active Problems:   Primary cancer of head of pancreas (HCC)   Abdominal pain   Diabetes mellitus type 2, uncontrolled ([place])   Protein malnutrition ([place])   Transaminitis   Hyperbilirubinemia  1. Intractable nausea and vomiting with abdominal pain patient has pancreatic cancer and there is a questionable pancreatic outlet obstruction. She had been n.p.o. and now tolerating full liquid. Gastroenterology plans on doing ERCP tomorrow.  2. Hypertension blood pressure is  slightly elevated we will restart her telmisartan and her other home medication  3. Type 2 diabetes mellitus her last hemoglobin A1c was 7.2 continue home medicine.  Code Status: Full  Severity of Illness: The appropriate patient status for this patient is INPATIENT. Inpatient status is judged to be reasonable and necessary in order to provide the required intensity of service to ensure the patient's safety. The patient's presenting symptoms, physical exam findings, and initial radiographic and laboratory data in the context of their chronic comorbidities is felt to place them at high risk for further clinical deterioration. Furthermore, it is not anticipated that the patient will be medically stable for discharge from the hospital within 2 midnights of admission. The following factors support the patient status of inpatient.   " The patient's presenting symptoms include intractable nausea and vomiting. " The worrisome physical exam findings include possible pancreatic outlet obstruction given history of pancreatic cancer. " The initial radiographic and laboratory data are worrisome because of elevated bilirubin. " The chronic co-morbidities include pancreatic cancer and COVID-19 positive   * I certify that at the point of admission it is my clinical judgment that the patient will require inpatient hospital care spanning beyond 2 midnights from the point of admission due to high intensity of service, high risk for further deterioration and high frequency of surveillance required.*    Family Communication: Patient  Disposition Plan: To be determined   Consultants:  Gastroenterology  Procedures:  None  Antimicrobials:  Ciprofloxacin  DVT prophylaxis: Lovenox on hold for procedure   Objective: Vitals:  03/05/20 1949 03/05/20 2057 03/05/20 2107 03/06/20 0700  BP: (!) 143/93 137/65  (!) 162/61  Pulse: 77  80 90  Resp: 20 20  18   Temp: 98.3 F (36.8 C) 98.2 F (36.8 C)  98  F (36.7 C)  TempSrc: Oral Oral  Oral  SpO2: 99%  96% 100%  Weight:      Height:        Intake/Output Summary (Last 24 hours) at 03/06/2020 1545 Last data filed at 03/06/2020 0300 Gross per 24 hour  Intake 375 ml  Output --  Net 375 ml   Filed Weights   03/05/20 0842  Weight: 58.5 kg   Body mass index is 22.49 kg/m.  Exam:  . General: 80 y.o. year-old female well developed well nourished in no acute distress.  Alert and oriented x3. . Cardiovascular: Regular rate and rhythm with no rubs or gallops.  No thyromegaly or JVD noted.   Marland Kitchen Respiratory: Clear to auscultation with no wheezes or rales. Good inspiratory effort. . Abdomen: Soft nontender nondistended with normal bowel sounds x4 quadrants. . Musculoskeletal: No lower extremity edema. 2/4 pulses in all 4 extremities. . Skin: No ulcerative lesions noted or rashes, . Psychiatry: Mood is appropriate for condition and setting    Data Reviewed: CBC: Recent Labs  Lab 03/05/20 0857 03/06/20 0355  WBC 6.2 9.0  HGB 11.3* 10.9*  HCT 35.3* 33.2*  MCV 89.8 89.7  PLT 263 585   Basic Metabolic Panel: Recent Labs  Lab 03/03/20 1801 03/05/20 0857 03/06/20 0355  NA 136 139 138  K 3.8 3.7 3.7  CL 103 106 106  CO2 20 21* 17*  GLUCOSE 111* 151* 143*  BUN 12 14 12   CREATININE 0.46* 0.63 0.50  CALCIUM 8.4* 8.4* 8.1*   GFR: Estimated Creatinine Clearance: 48.2 mL/min (by C-G formula based on SCr of 0.5 mg/dL). Liver Function Tests: Recent Labs  Lab 03/03/20 1801 03/05/20 0857  AST 364* 267*  ALT 331* 318*  ALKPHOS 853* 748*  BILITOT 1.9* 4.0*  PROT 6.1 6.4*  ALBUMIN 3.1* 2.6*   Recent Labs  Lab 03/03/20 1801 03/05/20 0857  LIPASE 5* 12  AMYLASE 9*  --    No results for input(s): AMMONIA in the last 168 hours. Coagulation Profile: Recent Labs  Lab 03/05/20 0942  INR 1.2   Cardiac Enzymes: No results for input(s): CKTOTAL, CKMB, CKMBINDEX, TROPONINI in the last 168 hours. BNP (last 3 results) No  results for input(s): PROBNP in the last 8760 hours. HbA1C: Recent Labs    03/05/20 0857  HGBA1C 7.2*   CBG: Recent Labs  Lab 03/05/20 0843 03/05/20 1100 03/05/20 1644 03/06/20 0658 03/06/20 1126  GLUCAP 149* 143* 134* 128* 186*   Lipid Profile: No results for input(s): CHOL, HDL, LDLCALC, TRIG, CHOLHDL, LDLDIRECT in the last 72 hours. Thyroid Function Tests: No results for input(s): TSH, T4TOTAL, FREET4, T3FREE, THYROIDAB in the last 72 hours. Anemia Panel: No results for input(s): VITAMINB12, FOLATE, FERRITIN, TIBC, IRON, RETICCTPCT in the last 72 hours. Urine analysis:    Component Value Date/Time   COLORURINE AMBER (A) 03/05/2020 0852   APPEARANCEUR HAZY (A) 03/05/2020 0852   LABSPEC 1.013 03/05/2020 0852   PHURINE 6.0 03/05/2020 0852   GLUCOSEU NEGATIVE 03/05/2020 0852   HGBUR NEGATIVE 03/05/2020 0852   BILIRUBINUR SMALL (A) 03/05/2020 0852   BILIRUBINUR small (A) 11/17/2019 0906   KETONESUR 20 (A) 03/05/2020 0852   PROTEINUR NEGATIVE 03/05/2020 0852   UROBILINOGEN 0.2 11/17/2019 0906   NITRITE  NEGATIVE 03/05/2020 0852   LEUKOCYTESUR NEGATIVE 03/05/2020 0852   Sepsis Labs: @LABRCNTIP (procalcitonin:4,lacticidven:4)  ) Recent Results (from the past 240 hour(s))  SARS Coronavirus 2 by RT PCR (hospital order, performed in [facility] hospital lab) Nasopharyngeal Nasopharyngeal Swab     Status: Abnormal   Collection Time: 03/05/20  4:16 PM   Specimen: Nasopharyngeal Swab  Result Value Ref Range Status   SARS Coronavirus 2 POSITIVE (A) NEGATIVE Final    Comment: RESULT CALLED TO, READ BACK BY AND VERIFIED WITH: Z CAGLE RN 1820 03/05/20 A BROWNING (NOTE) SARS-CoV-2 target nucleic acids are DETECTED  SARS-CoV-2 RNA is generally detectable in upper respiratory specimens  during the acute phase of infection.  Positive results are indicative  of the presence of the identified virus, but do not rule out bacterial infection or co-infection with other pathogens  not detected by the test.  Clinical correlation with patient history and  other diagnostic information is necessary to determine patient infection status.  The expected result is negative.  Fact Sheet for Patients:   StrictlyIdeas.no   Fact Sheet for Healthcare Providers:   BankingDealers.co.za    This test is not yet approved or cleared by the [place] FDA and  has been authorized for detection and/or diagnosis of SARS-CoV-2 by FDA under an Emergency Use Authorization (EUA).  This EUA will remain in effect (meaning this te st can be used) for the duration of  the COVID-19 declaration under Section 564(b)(1) of the Act, 21 U.S.C. section 360-bbb-3(b)(1), unless the authorization is terminated or revoked sooner.  Performed at [facility], [place] [address] [place], [place] [address]       Studies: No results found.  Scheduled Meds: . feeding supplement  237 mL Oral BID BM  . [START ON 03/07/2020] indomethacin  100 mg Rectal Once  . insulin aspart  0-5 Units Subcutaneous QHS  . insulin aspart  0-9 Units Subcutaneous TID WC  . melatonin  3 mg Oral QHS  . sodium chloride flush  3 mL Intravenous Q12H    Continuous Infusions: . sodium chloride 75 mL/hr at 03/06/20 1128  . [START ON 03/07/2020] ciprofloxacin       LOS: 1 day     Cristal Deer, MD [facility]  To reach me or the doctor on call, go to: [email or web address] Password [facility]  03/06/2020, 3:45 PM

## 2020-03-06 NOTE — Plan of Care (Signed)

## 2020-03-06 NOTE — Progress Notes (Signed)
Initial Nutrition Assessment  DOCUMENTATION CODES:   Not applicable  INTERVENTION:   -Continue Ensure Enlive po BID, each supplement provides 350 kcal and 20 grams of protein -MVI with minerals daily -Magic cup TID with meals, each supplement provides 290 kcal and 9 grams of protein  NUTRITION DIAGNOSIS:   Increased nutrient needs related to cancer and cancer related treatments as evidenced by estimated needs.  GOAL:   Patient will meet greater than or equal to 90% of their needs  MONITOR:   PO intake,Supplement acceptance,Diet advancement,Labs,Weight trends,Skin,I & O's  REASON FOR ASSESSMENT:   Malnutrition Screening Tool    ASSESSMENT:   Nancy Hawkins is a 80 y.o. female with medical history significant of hypertension, hyperlipidemia, A. Fib, diabetes mellitus type 2, pancreatic cancer diagnosed in October 2021 and recent COVID-19 infection on 02/10/2019 presents with complaints of abdominal pain with nausea and vomiting over the last week.  She complains of having lower abdominal pain that she describes as crampy in nature with to 2-3 loose stools per day that are gray-colored.  Patient reports being unable to keep any significant amount of food or liquids down over the last 2 to 3 days which gave her concern because she is a diabetic.  She was seen in urgent care and noted to have elevated liver enzymes 2 days ago's.  Due to having Covid-19 it delayed her starting chemotherapy treatment.  Plan is for her to have the Port-A-Cath placed next week to initiate chemotherapy.  Pt admitted with abdominal pain with nausea and vomiting secondary to pancreatic cancer.   Pt unavailable at time of visit.   Per H&P, pt unable to keep foods or liquids down for the past 2-3 days PTA. Per GI notes, pt with no nausea since last night and is tolerating clear liquids. Plan for ERCP tomorrow (03/07/20) due to biliary obstruction. She refused Ensure this afternoon, as she had just eaten prior to it  being offered..  Reviewed wt hx; pt has experienced a 17% wt loss over the past 3 months, which is significant for time frame.   Medications reviewed and include melatonin and 0.9% sodium chloride infusion @ 75 ml/hr.   Lab Results  Component Value Date   HGBA1C 7.2 (H) 03/05/2020   PTA DM medications are 3 units insulin aspart TID and 12 units insulin glargine daily.   Labs reviewed: CBGS: 128-186 (inpatient orders for glycemic control are 0-5 units inuslin aspart daily at bedtime and 0-9 units insulin aspart TID with meals).   Diet Order:   Diet Order            Diet Carb Modified Fluid consistency: Thin; Room service appropriate? Yes  Diet effective now                 EDUCATION NEEDS:   No education needs have been identified at this time  Skin:  Skin Assessment: Reviewed RN Assessment  Last BM:  03/05/20  Height:   Ht Readings from Last 1 Encounters:  03/05/20 5' 3.5" (1.613 m)    Weight:   Wt Readings from Last 1 Encounters:  03/05/20 58.5 kg    Ideal Body Weight:  53.4 kg  BMI:  Body mass index is 22.49 kg/m.  Estimated Nutritional Needs:   Kcal:  2050-2250  Protein:  105-120 grams  Fluid:  > 2 L    Loistine Chance, RD, LDN, [place] Registered Dietitian II Certified Diabetes Care and Education Specialist Please refer to [facility] for RD and/or  RD on-call/weekend/after hours pager

## 2020-03-06 NOTE — H&P (View-Only) (Signed)
Daily Rounding Note  03/06/2020, 12:40 PM  LOS: 1 day   SUBJECTIVE:   Chief complaint:    , Vomiting, abdominal pain, pancreatic cancer.  Jaundiced  GI symptoms improved.  No nausea since last night.  Tolerating some solid and clear liquid foods.  Loose stools.  OBJECTIVE:         Vital signs in last 24 hours:    Temp:  [97.7 F (36.5 C)-98.3 F (36.8 C)] 98 F (36.7 C) (01/28 0700) Pulse Rate:  [63-90] 90 (01/28 0700) Resp:  [17-23] 18 (01/28 0700) BP: (130-162)/(54-93) 162/61 (01/28 0700) SpO2:  [96 %-100 %] 100 % (01/28 0700) Last BM Date: 03/05/20 Filed Weights   03/05/20 0842  Weight: 58.5 kg   General: Looks better.  Not ill-appearing. Heart: RRR Chest: Labored breathing Abdomen: Not tender, not distended.  Unable to listen to bowel sounds since there was not a stethoscope in the COVID-19 isolation room Extremities: No CCE Neuro/Psych: Alert.  Oriented x3.  Calm.  Fluid speech  Intake/Output from previous day: 01/27 0701 - 01/28 0700 In: 375 [I.V.:375] Out: -   Intake/Output this shift: No intake/output data recorded.  Lab Results: Recent Labs    03/05/20 0857 03/06/20 0355  WBC 6.2 9.0  HGB 11.3* 10.9*  HCT 35.3* 33.2*  PLT 263 258   BMET Recent Labs    03/03/20 1801 03/05/20 0857 03/06/20 0355  NA 136 139 138  K 3.8 3.7 3.7  CL 103 106 106  CO2 20 21* 17*  GLUCOSE 111* 151* 143*  BUN 12 14 12   CREATININE 0.46* 0.63 0.50  CALCIUM 8.4* 8.4* 8.1*   LFT Recent Labs    03/03/20 1801 03/05/20 0857  PROT 6.1 6.4*  ALBUMIN 3.1* 2.6*  AST 364* 267*  ALT 331* 318*  ALKPHOS 853* 748*  BILITOT 1.9* 4.0*   PT/INR Recent Labs    03/05/20 0942  LABPROT 14.4  INR 1.2   Hepatitis Panel Recent Labs    03/05/20 0942  HEPBSAG NON REACTIVE  HCVAB NON REACTIVE  HEPAIGM Reactive*  HEPBIGM NON REACTIVE    Studies/Results: CT Head Wo Contrast  Result Date: 03/05/2020 CLINICAL  DATA:  History of pancreatic carcinoma EXAM: CT HEAD WITHOUT CONTRAST TECHNIQUE: Contiguous axial images were obtained from the base of the skull through the vertex without intravenous contrast. COMPARISON:  None. FINDINGS: Brain: No evidence of acute infarction, hemorrhage, hydrocephalus, extra-axial collection or mass lesion/mass effect. Vascular: No hyperdense vessel or unexpected calcification. Skull: Normal. Negative for fracture or focal lesion. Sinuses/Orbits: Postsurgical changes are noted in the maxillary antra bilaterally. Mucosal thickening is noted within the ethmoid and frontal sinuses. Other: None. IMPRESSION: Changes of chronic sinusitis. No acute intracranial abnormality noted. Electronically Signed   By: Inez Catalina M.D.   On: 03/05/2020 13:32   CT ABDOMEN PELVIS W CONTRAST  Result Date: 03/05/2020 CLINICAL DATA:  Nausea and vomiting.  Abdominal pain. EXAM: CT ABDOMEN AND PELVIS WITH CONTRAST TECHNIQUE: Multidetector CT imaging of the abdomen and pelvis was performed using the standard protocol following bolus administration of intravenous contrast. CONTRAST:  136mL OMNIPAQUE IOHEXOL 300 MG/ML  SOLN COMPARISON:  CT abdomen pelvis 11/24/2019. FINDINGS: Lower chest: Normal heart size. Patchy areas of consolidation right lower lobe. No pleural effusion. Hepatobiliary: Liver is diffusely low in attenuation compatible with steatosis. Prior cholecystectomy. Mild intrahepatic and extrahepatic biliary ductal dilatation. Common bile duct is dilated to the level of the pancreatic mass. Pancreas: The  pancreatic duct remains dilated measuring up to 6 mm. Slight interval increase in size of ill-defined mass within the region the pancreatic head/uncinate process measuring 3.5 x 3.7 cm. 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 Minimal peripancreatic fat stranding. Redemonstrated portosystemic collateral vessels within the upper abdomen. Spleen: Unremarkable. Adrenals/Urinary Tract: Normal  adrenal glands. Kidneys enhance symmetrically with contrast. Partially exophytic cyst mid pole left kidney. Additional subcentimeter too small to characterize low-attenuation renal lesions. Urinary bladder is unremarkable. Stomach/Bowel: Pancolonic diverticulosis. No CT evidence for acute diverticulitis. Normal morphology of the stomach. Vascular/Lymphatic: Normal caliber abdominal aorta. Peripheral calcified atherosclerotic plaque. Reproductive: Prior hysterectomy. Other: None. Musculoskeletal: Multiple unchanged compression fractures involving the lower thoracic and lumbar spine. No aggressive appearing osseous lesions. IMPRESSION: 1. Slight interval increase in size of ill-defined mass within the region of the pancreatic head/uncinate process measuring up to 3.5 cm. 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 2. Hepatic steatosis. 3. Patchy areas of consolidation right lower lobe which may represent infection. Recommend continued attention on follow-up to ensure resolution. 4. Pancolonic diverticulosis without CT evidence for acute diverticulitis. 5. Multiple unchanged compression fractures involving the lower thoracic and lumbar spine. 6. Aortic atherosclerosis. Electronically Signed   By: Drew  Davis M.D.   On: 03/05/2020 14:05   Scheduled Meds: . enoxaparin (LOVENOX) injection  40 mg Subcutaneous Q24H  . insulin aspart  0-5 Units Subcutaneous QHS  . insulin aspart  0-9 Units Subcutaneous TID WC  . melatonin  3 mg Oral QHS  . sodium chloride flush  3 mL Intravenous Q12H   Continuous Infusions: . sodium chloride 75 mL/hr at 03/06/20 1128   PRN Meds:.albuterol, morphine injection, ondansetron **OR** ondansetron (ZOFRAN) IV, oxyCODONE, prochlorperazine  ASSESMENT:   *   Pancreatic cancer.  Now with biliary obstruction.  N/V worrisome for gastric outlet obstruction. Suspect abdominal pain due to progressive cancer,  obstruction. Bilirubin up 1.9 >> 4.0.  Alkaline phosphatase and transaminases improved. WBCs normal.  *   COVID-19 positive on 02/10/2020.  Did not develop serious illness or require admission.  Was retested yesterday and this is still positive.  CT chest 12/22 with no acute cardiopulmonary abnormalities.  *    IDDM.  A1c 7.2.  PLAN   *    ERCP with Dr. Patrick Hung tomorrow.   Discontinued the subcu Lovenox to allow for reduced risk of bleeding at the ercp  Sarah Gribbin  03/06/2020, 12:40 PM Phone [phone]  GI ATTENDING  Interval history data reviewed.  Patient personally seen and examined.  Agree with comprehensive interval progress note as outlined above.  The patient is a bit somnolent but feeling better than yesterday.  Still with some back discomfort.  No further nausea and vomiting.  Laboratories are stable.  She is for ERCP with stent placement tomorrow with Dr. Hung.The nature of the procedure, as well as the risks, benefits, and alternatives were carefully and thoroughly reviewed with the patient. Ample time for discussion and questions allowed. The patient understood, was satisfied, and agreed to proceed.  Ayham Word N. Abran Gavigan, Jr., M.D. [facility] Division of Gastroenterology 

## 2020-03-06 NOTE — Progress Notes (Addendum)
Daily Rounding Note  03/06/2020, 12:40 PM  LOS: 1 day   SUBJECTIVE:   Chief complaint:    , Vomiting, abdominal pain, pancreatic cancer.  Jaundiced  GI symptoms improved.  No nausea since last night.  Tolerating some solid and clear liquid foods.  Loose stools.  OBJECTIVE:         Vital signs in last 24 hours:    Temp:  [97.7 F (36.5 C)-98.3 F (36.8 C)] 98 F (36.7 C) (01/28 0700) Pulse Rate:  [63-90] 90 (01/28 0700) Resp:  [17-23] 18 (01/28 0700) BP: (130-162)/(54-93) 162/61 (01/28 0700) SpO2:  [96 %-100 %] 100 % (01/28 0700) Last BM Date: 03/05/20 Filed Weights   03/05/20 0842  Weight: 58.5 kg   General: Looks better.  Not ill-appearing. Heart: RRR Chest: Labored breathing Abdomen: Not tender, not distended.  Unable to listen to bowel sounds since there was not a stethoscope in the COVID-19 isolation room Extremities: No CCE Neuro/Psych: Alert.  Oriented x3.  Calm.  Fluid speech  Intake/Output from previous day: 01/27 0701 - 01/28 0700 In: 375 [I.V.:375] Out: -   Intake/Output this shift: No intake/output data recorded.  Lab Results: Recent Labs    03/05/20 0857 03/06/20 0355  WBC 6.2 9.0  HGB 11.3* 10.9*  HCT 35.3* 33.2*  PLT 263 258   BMET Recent Labs    03/03/20 1801 03/05/20 0857 03/06/20 0355  NA 136 139 138  K 3.8 3.7 3.7  CL 103 106 106  CO2 20 21* 17*  GLUCOSE 111* 151* 143*  BUN 12 14 12   CREATININE 0.46* 0.63 0.50  CALCIUM 8.4* 8.4* 8.1*   LFT Recent Labs    03/03/20 1801 03/05/20 0857  PROT 6.1 6.4*  ALBUMIN 3.1* 2.6*  AST 364* 267*  ALT 331* 318*  ALKPHOS 853* 748*  BILITOT 1.9* 4.0*   PT/INR Recent Labs    03/05/20 0942  LABPROT 14.4  INR 1.2   Hepatitis Panel Recent Labs    03/05/20 0942  HEPBSAG NON REACTIVE  HCVAB NON REACTIVE  HEPAIGM Reactive*  HEPBIGM NON REACTIVE    Studies/Results: CT Head Wo Contrast  Result Date: 03/05/2020 CLINICAL  DATA:  History of pancreatic carcinoma EXAM: CT HEAD WITHOUT CONTRAST TECHNIQUE: Contiguous axial images were obtained from the base of the skull through the vertex without intravenous contrast. COMPARISON:  None. FINDINGS: Brain: No evidence of acute infarction, hemorrhage, hydrocephalus, extra-axial collection or mass lesion/mass effect. Vascular: No hyperdense vessel or unexpected calcification. Skull: Normal. Negative for fracture or focal lesion. Sinuses/Orbits: Postsurgical changes are noted in the maxillary antra bilaterally. Mucosal thickening is noted within the ethmoid and frontal sinuses. Other: None. IMPRESSION: Changes of chronic sinusitis. No acute intracranial abnormality noted. Electronically Signed   By: Inez Catalina M.D.   On: 03/05/2020 13:32   CT ABDOMEN PELVIS W CONTRAST  Result Date: 03/05/2020 CLINICAL DATA:  Nausea and vomiting.  Abdominal pain. EXAM: CT ABDOMEN AND PELVIS WITH CONTRAST TECHNIQUE: Multidetector CT imaging of the abdomen and pelvis was performed using the standard protocol following bolus administration of intravenous contrast. CONTRAST:  136mL OMNIPAQUE IOHEXOL 300 MG/ML  SOLN COMPARISON:  CT abdomen pelvis 11/24/2019. FINDINGS: Lower chest: Normal heart size. Patchy areas of consolidation right lower lobe. No pleural effusion. Hepatobiliary: Liver is diffusely low in attenuation compatible with steatosis. Prior cholecystectomy. Mild intrahepatic and extrahepatic biliary ductal dilatation. Common bile duct is dilated to the level of the pancreatic mass. Pancreas: The  pancreatic duct remains dilated measuring up to 6 mm. Slight interval increase in size of ill-defined mass within the region the pancreatic head/uncinate process measuring 3.5 x 3.7 cm. 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 Minimal peripancreatic fat stranding. Redemonstrated portosystemic collateral vessels within the upper abdomen. Spleen: Unremarkable. Adrenals/Urinary Tract: Normal  adrenal glands. Kidneys enhance symmetrically with contrast. Partially exophytic cyst mid pole left kidney. Additional subcentimeter too small to characterize low-attenuation renal lesions. Urinary bladder is unremarkable. Stomach/Bowel: Pancolonic diverticulosis. No CT evidence for acute diverticulitis. Normal morphology of the stomach. Vascular/Lymphatic: Normal caliber abdominal aorta. Peripheral calcified atherosclerotic plaque. Reproductive: Prior hysterectomy. Other: None. Musculoskeletal: Multiple unchanged compression fractures involving the lower thoracic and lumbar spine. No aggressive appearing osseous lesions. IMPRESSION: 1. Slight interval increase in size of ill-defined mass within the region of the pancreatic head/uncinate process measuring up to 3.5 cm. 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 2. Hepatic steatosis. 3. Patchy areas of consolidation right lower lobe which may represent infection. Recommend continued attention on follow-up to ensure resolution. 4. Pancolonic diverticulosis without CT evidence for acute diverticulitis. 5. Multiple unchanged compression fractures involving the lower thoracic and lumbar spine. 6. Aortic atherosclerosis. Electronically Signed   By: Lovey Newcomer M.D.   On: 03/05/2020 14:05   Scheduled Meds: . enoxaparin (LOVENOX) injection  40 mg Subcutaneous Q24H  . insulin aspart  0-5 Units Subcutaneous QHS  . insulin aspart  0-9 Units Subcutaneous TID WC  . melatonin  3 mg Oral QHS  . sodium chloride flush  3 mL Intravenous Q12H   Continuous Infusions: . sodium chloride 75 mL/hr at 03/06/20 1128   PRN Meds:.albuterol, morphine injection, ondansetron **OR** ondansetron (ZOFRAN) IV, oxyCODONE, prochlorperazine  ASSESMENT:   *   Pancreatic cancer.  Now with biliary obstruction.  N/V worrisome for gastric outlet obstruction. Suspect abdominal pain due to progressive cancer,  obstruction. Bilirubin up 1.9 >> 4.0.  Alkaline phosphatase and transaminases improved. WBCs normal.  *   COVID-19 positive on 02/10/2020.  Did not develop serious illness or require admission.  Was retested yesterday and this is still positive.  CT chest 12/22 with no acute cardiopulmonary abnormalities.  *    IDDM.  A1c 7.2.  PLAN   *    ERCP with Dr. Carol Ada tomorrow.   Discontinued the subcu Lovenox to allow for reduced risk of bleeding at the ercp  Azucena Freed  03/06/2020, 12:40 PM Phone [phone]  GI ATTENDING  Interval history data reviewed.  Patient personally seen and examined.  Agree with comprehensive interval progress note as outlined above.  The patient is a bit somnolent but feeling better than yesterday.  Still with some back discomfort.  No further nausea and vomiting.  Laboratories are stable.  She is for ERCP with stent placement tomorrow with Dr. Benson Norway.The nature of the procedure, as well as the risks, benefits, and alternatives were carefully and thoroughly reviewed with the patient. Ample time for discussion and questions allowed. The patient understood, was satisfied, and agreed to proceed.  Docia Chuck. Geri Seminole., M.D. [facility] Division of Gastroenterology

## 2020-03-07 ENCOUNTER — Inpatient Hospital Stay (HOSPITAL_COMMUNITY): Payer: Medicare Other | Admitting: Certified Registered Nurse Anesthetist

## 2020-03-07 ENCOUNTER — Inpatient Hospital Stay (HOSPITAL_COMMUNITY): Payer: Medicare Other

## 2020-03-07 ENCOUNTER — Encounter (HOSPITAL_COMMUNITY)
Admission: EM | Disposition: A | Discharge status: Home / Self Care | Payer: Self-pay | Source: Home / Self Care | Attending: Family Medicine

## 2020-03-07 HISTORY — PX: SPHINCTEROTOMY: SHX5544

## 2020-03-07 HISTORY — PX: ENDOSCOPIC RETROGRADE CHOLANGIOPANCREATOGRAPHY (ERCP) WITH PROPOFOL: SHX5810

## 2020-03-07 LAB — GLUCOSE, CAPILLARY
Glucose-Capillary: 199 mg/dL — ABNORMAL HIGH (ref 70–99)
Glucose-Capillary: 159 mg/dL — ABNORMAL HIGH (ref 70–99)
Glucose-Capillary: 230 mg/dL — ABNORMAL HIGH (ref 70–99)
Glucose-Capillary: 269 mg/dL — ABNORMAL HIGH (ref 70–99)

## 2020-03-07 SURGERY — ENDOSCOPIC RETROGRADE CHOLANGIOPANCREATOGRAPHY (ERCP) WITH PROPOFOL
Anesthesia: General

## 2020-03-07 MED ORDER — SUGAMMADEX SODIUM 200 MG/2ML IV SOLN
INTRAVENOUS | Status: DC | PRN
  Administered 2020-03-07: 200 mg via INTRAVENOUS

## 2020-03-07 MED ORDER — LACTATED RINGERS IV SOLN: INTRAVENOUS | Status: DC | PRN

## 2020-03-07 MED ORDER — FENTANYL CITRATE (PF) 100 MCG/2ML IJ SOLN
INTRAMUSCULAR | Status: DC | PRN
  Administered 2020-03-07 (×2): 50 ug via INTRAVENOUS

## 2020-03-07 MED ORDER — DEXAMETHASONE SODIUM PHOSPHATE 10 MG/ML IJ SOLN
INTRAMUSCULAR | Status: DC | PRN
  Administered 2020-03-07: 5 mg via INTRAVENOUS

## 2020-03-07 MED ORDER — FENTANYL CITRATE (PF) 100 MCG/2ML IJ SOLN
INTRAMUSCULAR | Status: AC
  Filled 2020-03-07: qty 2

## 2020-03-07 MED ORDER — INDOMETHACIN 50 MG RE SUPP
RECTAL | Status: AC
  Filled 2020-03-07: qty 2

## 2020-03-07 MED ORDER — PROPOFOL 10 MG/ML IV BOLUS
INTRAVENOUS | Status: DC | PRN
  Administered 2020-03-07: 100 mg via INTRAVENOUS

## 2020-03-07 MED ORDER — SUCCINYLCHOLINE CHLORIDE 200 MG/10ML IV SOSY
PREFILLED_SYRINGE | INTRAVENOUS | Status: DC | PRN
  Administered 2020-03-07: 60 mg via INTRAVENOUS

## 2020-03-07 MED ORDER — CIPROFLOXACIN IN D5W 400 MG/200ML IV SOLN
INTRAVENOUS | Status: AC
  Filled 2020-03-07: qty 200

## 2020-03-07 MED ORDER — GLUCAGON HCL RDNA (DIAGNOSTIC) 1 MG IJ SOLR
INTRAMUSCULAR | Status: AC
  Filled 2020-03-07: qty 1

## 2020-03-07 MED ORDER — LIDOCAINE 2% (20 MG/ML) 5 ML SYRINGE
INTRAMUSCULAR | Status: DC | PRN
  Administered 2020-03-07: 60 mg via INTRAVENOUS

## 2020-03-07 MED ORDER — ONDANSETRON HCL 4 MG/2ML IJ SOLN
INTRAMUSCULAR | Status: DC | PRN
  Administered 2020-03-07: 4 mg via INTRAVENOUS

## 2020-03-07 MED ORDER — PHENYLEPHRINE HCL (PRESSORS) 10 MG/ML IV SOLN
INTRAVENOUS | Status: DC | PRN
  Administered 2020-03-07: 80 ug via INTRAVENOUS

## 2020-03-07 MED ORDER — ROCURONIUM BROMIDE 10 MG/ML (PF) SYRINGE
PREFILLED_SYRINGE | INTRAVENOUS | Status: DC | PRN
  Administered 2020-03-07: 20 mg via INTRAVENOUS
  Administered 2020-03-07: 40 mg via INTRAVENOUS

## 2020-03-07 MED ORDER — PHENYLEPHRINE HCL-NACL 10-0.9 MG/250ML-% IV SOLN
INTRAVENOUS | Status: DC | PRN
  Administered 2020-03-07: 25 ug/min via INTRAVENOUS

## 2020-03-07 NOTE — Anesthesia Postprocedure Evaluation (Signed)
Anesthesia Post Note  Patient: Nancy Hawkins  Procedure(s) Performed: ENDOSCOPIC RETROGRADE CHOLANGIOPANCREATOGRAPHY (ERCP) WITH PROPOFOL (N/A ) SPHINCTEROTOMY     Patient location during evaluation: PACU Anesthesia Type: General Level of consciousness: awake Pain management: pain level controlled Vital Signs Assessment: post-procedure vital signs reviewed and stable Respiratory status: spontaneous breathing, nonlabored ventilation, respiratory function stable and patient connected to nasal cannula oxygen Cardiovascular status: blood pressure returned to baseline and stable Postop Assessment: no apparent nausea or vomiting Anesthetic complications: no   No complications documented.  Last Vitals:  Vitals:   03/07/20 1530 03/07/20 1602  BP:  132/70  Pulse: 76 62  Resp: 18 16  Temp:  36.9 C  SpO2: 96% 95%    Last Pain:  Vitals:   03/07/20 1602  TempSrc: Oral  PainSc: 4                  Joud Pettinato P Priyana Mccarey

## 2020-03-07 NOTE — Plan of Care (Signed)

## 2020-03-07 NOTE — Transfer of Care (Signed)
Immediate Anesthesia Transfer of Care Note  Patient: Nancy Hawkins  Procedure(s) Performed: ENDOSCOPIC RETROGRADE CHOLANGIOPANCREATOGRAPHY (ERCP) WITH PROPOFOL (N/A ) SPHINCTEROTOMY  Patient Location: Endoscopy Unit  Anesthesia Type:General  Level of Consciousness: drowsy and patient cooperative  Airway & Oxygen Therapy: Patient Spontanous Breathing  Post-op Assessment: Report given to RN and Post -op Vital signs reviewed and stable  Post vital signs: Reviewed and stable  Last Vitals:  Vitals Value Taken Time  BP 155/54 03/07/20 1506  Temp    Pulse 80 03/07/20 1507  Resp 22 03/07/20 1507  SpO2 99 % 03/07/20 1507  Vitals shown include unvalidated device data.  Last Pain:  Vitals:   03/07/20 1157  TempSrc: Temporal  PainSc: 0-No pain         Complications: No complications documented.

## 2020-03-07 NOTE — Interval H&P Note (Signed)
History and Physical Interval Note:  03/07/2020 12:39 PM  Nancy Hawkins  has presented today for surgery, with the diagnosis of Biliary obstruction, pancreatic cancer..  The various methods of treatment have been discussed with the patient and family. After consideration of risks, benefits and other options for treatment, the patient has consented to  Procedure(s): ENDOSCOPIC RETROGRADE CHOLANGIOPANCREATOGRAPHY (ERCP) WITH PROPOFOL (N/A) as a surgical intervention.  The patient's history has been reviewed, patient examined, no change in status, stable for surgery.  I have reviewed the patient's chart and labs.  Questions were answered to the patient's satisfaction.     Antowan Samford D

## 2020-03-07 NOTE — Op Note (Signed)
[facility] Patient Name: Nancy Hawkins Procedure Date : 03/07/2020 MRN: [id number] Attending MD: Carol Ada , MD Date of Birth: 11-02-1940 CSN: [id number] Age: 80 Admit Type: Inpatient Procedure:                ERCP Indications:              Malignant stricture of the common bile duct Providers:                Carol Ada, MD, Glori Bickers, RN, Baird Cancer,                            RN, Laverda Sorenson, Technician, William Dalton,                            Technician, Clearnce Sorrel, CRNA Referring MD:              Medicines:                General Anesthesia Complications:            No immediate complications. Estimated Blood Loss:     Estimated blood loss: none. Procedure:                Pre-Anesthesia Assessment:                           - Prior to the procedure, a History and Physical                            was performed, and patient medications and                            allergies were reviewed. The patient's tolerance of                            previous anesthesia was also reviewed. The risks                            and benefits of the procedure and the sedation                            options and risks were discussed with the patient.                            All questions were answered, and informed consent                            was obtained. Prior Anticoagulants: The patient has                            taken no previous anticoagulant or antiplatelet                            agents. ASA Grade Assessment: III - A patient with  severe systemic disease. After reviewing the risks                            and benefits, the patient was deemed in                            satisfactory condition to undergo the procedure.                           - Sedation was administered by an anesthesia                            professional. General anesthesia was attained.                           After obtaining informed  consent, the scope was                            passed under direct vision. Throughout the                            procedure, the patient's blood pressure, pulse, and                            oxygen saturations were monitored continuously. The                            [id number]-Q180V ([id number]) Olympus Duodenoscope was                            introduced through the mouth, and used to inject                            contrast into and used to inject contrast into the                            bile duct and dorsal pancreatic duct. The ERCP was                            technically difficult and complex. The patient                            tolerated the procedure well. Scope In: Scope Out: Findings:      The major papilla was normal. The minor papilla was normal. The bile       duct was deeply cannulated with the short-nosed traction sphincterotome.       Contrast was injected. I personally interpreted the pancreatic duct       images. A short 0.035 inch Soft Jagwire was passed into the ventral       pancreatic duct. A 3 mm ventral pancreatic sphincterotomy was made with       a braided traction (standard) sphincterotome using ERBE electrocautery.       There was no post-sphincterotomy bleeding.      The duodenoscope was advanced to the second  portion of the duodenum       without difficulty. Immediately the presumed major papilla was       identified as there was pus extruding from the ampulla. Cannulation of       the ampulla was performed with ease and with multiple attempts the       guidewire was advanced into the PD. Using a two wire technique mulitple       attempts were made to cannulate the CBD, but these attempts failed. A       submucosal tract was created. A 3-4 mm sphincterotomy was created to       help facilitate the cannulation, but this did not help. Attempting a       single wire technique did not allow for CBD cannulation. Spontaneous       drainage of more pus  was noted as well as some bile tinted fluid. In a       final attempt to cannulate the CBD the duodenoscope position was lost.       Reinsertion of the duodenoscope was advanced beyond the ampulla. Closer       inspection showed that there was another orifice and there was evidence       of bile from this area. This area appeared to be further distal to the       typical location of the ampulla. Cannulation of the area was successful,       but the guidewire was not able to be advanced. Because of the longevity       of the procedure, the ERCP was terminated. In retrospect, this secondary       ampulla was the major papilla and cannulation of the inital papilla was       the minor papilla. Impression:               - The major papilla appeared normal.                           - The minor papilla appeared normal.                           - A pancreatic sphincterotomy was performed. Recommendation:           - Return patient to hospital ward for ongoing care.                           - Resume regular diet.                           - Reattempt ERCP on Monday with Dr. Rush Landmark. Procedure Code(s):        --- Professional ---                           [phone], Endoscopic retrograde                            cholangiopancreatography (ERCP); with                            sphincterotomy/papillotomy  74329, Endoscopic catheterization of the pancreatic                            ductal system, radiological supervision and                            interpretation Diagnosis Code(s):        --- Professional ---                           K83.1, Obstruction of bile duct CPT copyright 2019 [employer]. All rights reserved. The codes documented in this report are preliminary and upon coder review may  be revised to meet current compliance requirements. Carol Ada, MD Carol Ada, MD 03/07/2020 3:16:17 PM This report has been signed  electronically. Number of Addenda: 0

## 2020-03-07 NOTE — Anesthesia Procedure Notes (Signed)
Procedure Name: Intubation Date/Time: 03/07/2020 12:34 PM Performed by: Clearnce Sorrel, CRNA Pre-anesthesia Checklist: Patient identified, Emergency Drugs available, Suction available, Patient being monitored and Timeout performed Patient Re-evaluated:Patient Re-evaluated prior to induction Oxygen Delivery Method: Circle system utilized Preoxygenation: Pre-oxygenation with 100% oxygen Induction Type: IV induction, Rapid sequence and Cricoid Pressure applied Laryngoscope Size: Glidescope and 3 (Covid positive) Grade View: Grade I Tube type: Oral Tube size: 7.0 mm Number of attempts: 1 Airway Equipment and Method: Stylet Placement Confirmation: ETT inserted through vocal cords under direct vision,  positive ETCO2 and breath sounds checked- equal and bilateral Secured at: 22 cm Tube secured with: Tape Dental Injury: Teeth and Oropharynx as per pre-operative assessment

## 2020-03-07 NOTE — Plan of Care (Signed)
  Problem: Pain Managment: Goal: General experience of comfort will improve Outcome: Progressing   Problem: Safety: Goal: Ability to remain free from injury will improve Outcome: Progressing   Problem: Skin Integrity: Goal: Risk for impaired skin integrity will decrease Outcome: Progressing   

## 2020-03-07 NOTE — Progress Notes (Signed)
Patient c/o nausea but refused med for it. Patient stated let wait see if she can work it off.

## 2020-03-07 NOTE — Anesthesia Preprocedure Evaluation (Addendum)
Anesthesia Evaluation  Patient identified by MRN, date of birth, ID band Patient awake    Reviewed: Allergy & Precautions, NPO status , Patient's Chart, lab work & pertinent test results  Airway Mallampati: III  TM Distance: >3 FB Neck ROM: Full    Dental  (+) Partial Lower   Pulmonary neg pulmonary ROS,    Pulmonary exam normal breath sounds clear to auscultation       Cardiovascular hypertension, Pt. on medications Normal cardiovascular exam+ dysrhythmias Atrial Fibrillation  Rhythm:Regular Rate:Normal  ECG: SR, rate 67   Neuro/Psych negative neurological ROS  negative psych ROS   GI/Hepatic GERD  Medicated and Controlled,Jaundice   Endo/Other  diabetes, Insulin Dependent  Renal/GU negative Renal ROS     Musculoskeletal negative musculoskeletal ROS (+)   Abdominal   Peds  Hematology  (+) anemia , HLD   Anesthesia Other Findings Biliary obstruction Pancreatic cancer Covid positive 1/4  Reproductive/Obstetrics                            Anesthesia Physical Anesthesia Plan  ASA: III  Anesthesia Plan: General   Post-op Pain Management:    Induction: Intravenous  PONV Risk Score and Plan: 4 or greater and Ondansetron, Dexamethasone, Midazolam and Treatment may vary due to age or medical condition  Airway Management Planned: Oral ETT  Additional Equipment:   Intra-op Plan:   Post-operative Plan: Extubation in OR  Informed Consent: I have reviewed the patients History and Physical, chart, labs and discussed the procedure including the risks, benefits and alternatives for the proposed anesthesia with the patient or authorized representative who has indicated his/her understanding and acceptance.     Dental advisory given  Plan Discussed with: CRNA  Anesthesia Plan Comments:        Anesthesia Quick Evaluation

## 2020-03-07 NOTE — Progress Notes (Addendum)
PROGRESS NOTE  Nancy Hawkins:[id number] DOB: [birth year] DOA: 03/05/2020 PCP: Lilian Coma., MD  HPI/Recap of past 24 hours: Nancy Hawkins is a 80 y.o. female with medical history significant of hypertension, hyperlipidemia, A. Fib, diabetes mellitus type 2, pancreatic cancer diagnosed in October 2021 and recent COVID-19 infection on 02/10/2019 presents with complaints of abdominal pain with nausea and vomiting over the last week.  She complains of having lower abdominal pain that she describes as crampy in nature with to 2-3 loose stools per day that are gray-colored.  Patient reports being unable to keep any significant amount of food or liquids down over the last 2 to 3 days which gave her concern because she is a diabetic.  She was seen in urgent care and noted to have elevated liver enzymes 2 days ago's.  Due to having Covid-19 it delayed her starting chemotherapy treatment.  Plan is for her to have the Port-A-Cath placed next week to initiate chemotherapy.  Subjective: March 07, 2020: Patient seen and examined at bedside.  Early in the morning she had gone for procedure but her husband Annie Main was in the room March 06, 2020 Seen and examined at bedside she stated she does not have any more nausea vomiting or abdominal pain. She tolerated her full liquid diet well she ate crackers Jell-O and some juices he says she is doing well. Patient has history of  pancreatic cancer and they thinks he may have outlet obstruction Patient is scheduled for ERCP tomorrow She is also Covid positive since March 08, 2020    Assessment/Plan: Principal Problem:   Nausea & vomiting Active Problems:   Primary cancer of head of pancreas (HCC)   Abdominal pain   Diabetes mellitus type 2, uncontrolled ([place])   Protein malnutrition ([place])   Transaminitis   Hyperbilirubinemia  1. Intractable nausea and vomiting with abdominal pain patient has pancreatic cancer and there is a questionable pancreatic outlet  obstruction. She had been n.p.o. and now tolerating full liquid. Gastroenterology did ERCP today but he seemed to be unsuccessful the planning on repeating it on Monday.  2. Hypertension blood pressure is slightly elevated we will restart her telmisartan and her other home medication  3. Type 2 diabetes mellitus her last hemoglobin A1c was 7.2 continue home medicine.  4.  Covid positive ,patient is asymptomatic  Code Status: DNR  Severity of Illness: The appropriate patient status for this patient is INPATIENT. Inpatient status is judged to be reasonable and necessary in order to provide the required intensity of service to ensure the patient's safety. The patient's presenting symptoms, physical exam findings, and initial radiographic and laboratory data in the context of their chronic comorbidities is felt to place them at high risk for further clinical deterioration. Furthermore, it is not anticipated that the patient will be medically stable for discharge from the hospital within 2 midnights of admission. The following factors support the patient status of inpatient.   " The patient's presenting symptoms include intractable nausea and vomiting. " The worrisome physical exam findings include possible pancreatic outlet obstruction given history of pancreatic cancer. " The initial radiographic and laboratory data are worrisome because of elevated bilirubin. " The chronic co-morbidities include pancreatic cancer and COVID-19 positive   * I certify that at the point of admission it is my clinical judgment that the patient will require inpatient hospital care spanning beyond 2 midnights from the point of admission due to high intensity of service, high risk for further  deterioration and high frequency of surveillance required.*    Family Communication: Patient/husband  Disposition Plan: To be  determined   Consultants:  Gastroenterology  Procedures:  None  Antimicrobials:  Ciprofloxacin  DVT prophylaxis: Lovenox on hold for procedure   Objective: Vitals:   03/07/20 1520 03/07/20 1526 03/07/20 1530 03/07/20 1602  BP:    132/70  Pulse: 73 74 76 62  Resp: 16 17 18 16   Temp:    98.4 F (36.9 C)  TempSrc:    Oral  SpO2: 97% 97% 96% 95%  Weight:      Height:        Intake/Output Summary (Last 24 hours) at 03/07/2020 1758 Last data filed at 03/07/2020 1503 Gross per 24 hour  Intake 400 ml  Output --  Net 400 ml   Filed Weights   03/05/20 0842 03/07/20 1157  Weight: 58.5 kg 58.5 kg   Body mass index is 22.49 kg/m.  Exam:  . General: 80 y.o. year-old female well developed well nourished in no acute distress.  Alert and oriented x3. . Cardiovascular: Regular rate and rhythm with no rubs or gallops.  No thyromegaly or JVD noted.   Marland Kitchen Respiratory: Clear to auscultation with no wheezes or rales. Good inspiratory effort. . Abdomen: Soft nontender nondistended with normal bowel sounds x4 quadrants. . Musculoskeletal: No lower extremity edema. 2/4 pulses in all 4 extremities. . Skin: No ulcerative lesions noted or rashes, . Psychiatry: Mood is appropriate for condition and setting    Data Reviewed: CBC: Recent Labs  Lab 03/05/20 0857 03/06/20 0355  WBC 6.2 9.0  HGB 11.3* 10.9*  HCT 35.3* 33.2*  MCV 89.8 89.7  PLT 263 0000000   Basic Metabolic Panel: Recent Labs  Lab 03/03/20 1801 03/05/20 0857 03/06/20 0355  NA 136 139 138  K 3.8 3.7 3.7  CL 103 106 106  CO2 20 21* 17*  GLUCOSE 111* 151* 143*  BUN 12 14 12   CREATININE 0.46* 0.63 0.50  CALCIUM 8.4* 8.4* 8.1*   GFR: Estimated Creatinine Clearance: 48.2 mL/min (by C-G formula based on SCr of 0.5 mg/dL). Liver Function Tests: Recent Labs  Lab 03/03/20 1801 03/05/20 0857  AST 364* 267*  ALT 331* 318*  ALKPHOS 853* 748*  BILITOT 1.9* 4.0*  PROT 6.1 6.4*  ALBUMIN 3.1* 2.6*   Recent Labs   Lab 03/03/20 1801 03/05/20 0857  LIPASE 5* 12  AMYLASE 9*  --    No results for input(s): AMMONIA in the last 168 hours. Coagulation Profile: Recent Labs  Lab 03/05/20 0942  INR 1.2   Cardiac Enzymes: No results for input(s): CKTOTAL, CKMB, CKMBINDEX, TROPONINI in the last 168 hours. BNP (last 3 results) No results for input(s): PROBNP in the last 8760 hours. HbA1C: Recent Labs    03/05/20 0857  HGBA1C 7.2*   CBG: Recent Labs  Lab 03/06/20 1728 03/06/20 2158 03/07/20 0644 03/07/20 1114 03/07/20 1615  GLUCAP 258* 202* 199* 159* 230*   Lipid Profile: No results for input(s): CHOL, HDL, LDLCALC, TRIG, CHOLHDL, LDLDIRECT in the last 72 hours. Thyroid Function Tests: No results for input(s): TSH, T4TOTAL, FREET4, T3FREE, THYROIDAB in the last 72 hours. Anemia Panel: No results for input(s): VITAMINB12, FOLATE, FERRITIN, TIBC, IRON, RETICCTPCT in the last 72 hours. Urine analysis:    Component Value Date/Time   COLORURINE AMBER (A) 03/05/2020 0852   APPEARANCEUR HAZY (A) 03/05/2020 0852   LABSPEC 1.013 03/05/2020 0852   PHURINE 6.0 03/05/2020 [place] 03/05/2020 [id number]  HGBUR NEGATIVE 03/05/2020 0852   BILIRUBINUR SMALL (A) 03/05/2020 0852   BILIRUBINUR small (A) 11/17/2019 0906   KETONESUR 20 (A) 03/05/2020 0852   PROTEINUR NEGATIVE 03/05/2020 0852   UROBILINOGEN 0.2 11/17/2019 0906   NITRITE NEGATIVE 03/05/2020 0852   LEUKOCYTESUR NEGATIVE 03/05/2020 0852   Sepsis Labs: @LABRCNTIP (procalcitonin:4,lacticidven:4)  ) Recent Results (from the past 240 hour(s))  SARS Coronavirus 2 by RT PCR (hospital order, performed in [facility] hospital lab) Nasopharyngeal Nasopharyngeal Swab     Status: Abnormal   Collection Time: 03/05/20  4:16 PM   Specimen: Nasopharyngeal Swab  Result Value Ref Range Status   SARS Coronavirus 2 POSITIVE (A) NEGATIVE Final    Comment: RESULT CALLED TO, READ BACK BY AND VERIFIED WITH: Z CAGLE RN [id number] 03/05/20 A  BROWNING (NOTE) SARS-CoV-2 target nucleic acids are DETECTED  SARS-CoV-2 RNA is generally detectable in upper respiratory specimens  during the acute phase of infection.  Positive results are indicative  of the presence of the identified virus, but do not rule out bacterial infection or co-infection with other pathogens not detected by the test.  Clinical correlation with patient history and  other diagnostic information is necessary to determine patient infection status.  The expected result is negative.  Fact Sheet for Patients:   StrictlyIdeas.no   Fact Sheet for Healthcare Providers:   BankingDealers.co.za    This test is not yet approved or cleared by the [place] FDA and  has been authorized for detection and/or diagnosis of SARS-CoV-2 by FDA under an Emergency Use Authorization (EUA).  This EUA will remain in effect (meaning this te st can be used) for the duration of  the COVID-19 declaration under Section 564(b)(1) of the Act, 21 U.S.C. section 360-bbb-3(b)(1), unless the authorization is terminated or revoked sooner.  Performed at [facility], [place] [address], [place], [place] [address]       Studies: DG C-Arm 1-60 Min-No Report  Result Date: 03/07/2020 Fluoroscopy was utilized by the requesting physician.  No radiographic interpretation.    Scheduled Meds: . diltiazem  120 mg Oral Daily  . feeding supplement  237 mL Oral BID BM  . indomethacin  100 mg Rectal Once  . insulin aspart  0-5 Units Subcutaneous QHS  . insulin aspart  0-9 Units Subcutaneous TID WC  . irbesartan  150 mg Oral Daily  . melatonin  3 mg Oral QHS  . multivitamin with minerals  1 tablet Oral Daily  . sodium chloride flush  3 mL Intravenous Q12H    Continuous Infusions: . sodium chloride 75 mL/hr at 03/07/20 0105     LOS: 2 days     Cristal Deer, MD [facility]  To reach me or the doctor on call, go  to: [email or web address] Password [facility]  03/07/2020, 5:58 PM

## 2020-03-07 NOTE — Plan of Care (Signed)
  Problem: Education: Goal: Knowledge of General Education information will improve Description: Including pain rating scale, medication(s)/side effects and non-pharmacologic comfort measures Outcome: Progressing   Problem: Clinical Measurements: Goal: Will remain free from infection Outcome: Progressing   Problem: Nutrition: Goal: Adequate nutrition will be maintained Outcome: Progressing   Problem: Pain Managment: Goal: General experience of comfort will improve Outcome: Progressing   Problem: Safety: Goal: Ability to remain free from injury will improve Outcome: Progressing

## 2020-03-07 NOTE — Progress Notes (Signed)
Patient called and asked if she can have something for nause, dont think she can work it off.

## 2020-03-08 ENCOUNTER — Encounter (HOSPITAL_COMMUNITY): Payer: Self-pay | Admitting: Gastroenterology

## 2020-03-08 LAB — COMPREHENSIVE METABOLIC PANEL
ALT: 179 U/L — ABNORMAL HIGH (ref 0–44)
AST: 156 U/L — ABNORMAL HIGH (ref 15–41)
Albumin: 2 g/dL — ABNORMAL LOW (ref 3.5–5.0)
Alkaline Phosphatase: 668 U/L — ABNORMAL HIGH (ref 38–126)
Anion gap: 9 (ref 5–15)
BUN: 11 mg/dL (ref 8–23)
CO2: 21 mmol/L — ABNORMAL LOW (ref 22–32)
Calcium: 7.9 mg/dL — ABNORMAL LOW (ref 8.9–10.3)
Chloride: 100 mmol/L (ref 98–111)
Creatinine, Ser: 0.6 mg/dL (ref 0.44–1.00)
GFR, Estimated: >60 mL/min (ref >60)
Glucose, Bld: 232 mg/dL — ABNORMAL HIGH (ref 70–99)
Potassium: 3.1 mmol/L — ABNORMAL LOW (ref 3.5–5.1)
Sodium: 130 mmol/L — ABNORMAL LOW (ref 135–145)
Total Bilirubin: 8.4 mg/dL — ABNORMAL HIGH (ref 0.3–1.2)
Total Protein: 5.3 g/dL — ABNORMAL LOW (ref 6.5–8.1)

## 2020-03-08 LAB — CBC WITH DIFFERENTIAL/PLATELET
Abs Immature Granulocytes: 0.05 10*3/uL (ref 0.00–0.07)
Basophils Absolute: 0 10*3/uL (ref 0.0–0.1)
Basophils Relative: 0 %
Eosinophils Absolute: 0 10*3/uL (ref 0.0–0.5)
Eosinophils Relative: 1 %
HCT: 28.7 % — ABNORMAL LOW (ref 36.0–46.0)
Hemoglobin: 9.9 g/dL — ABNORMAL LOW (ref 12.0–15.0)
Immature Granulocytes: 1 %
Lymphocytes Relative: 17 %
Lymphs Abs: 1.3 10*3/uL (ref 0.7–4.0)
MCH: 29.2 pg (ref 26.0–34.0)
MCHC: 34.5 g/dL (ref 30.0–36.0)
MCV: 84.7 fL (ref 80.0–100.0)
Monocytes Absolute: 0.9 10*3/uL (ref 0.1–1.0)
Monocytes Relative: 12 %
Neutro Abs: 5.2 10*3/uL (ref 1.7–7.7)
Neutrophils Relative %: 69 %
Platelets: 200 10*3/uL (ref 150–400)
RBC: 3.39 MIL/uL — ABNORMAL LOW (ref 3.87–5.11)
RDW: 15.3 % (ref 11.5–15.5)
WBC: 7.5 10*3/uL (ref 4.0–10.5)
nRBC: 0 % (ref 0.0–0.2)

## 2020-03-08 LAB — GLUCOSE, CAPILLARY
Glucose-Capillary: 277 mg/dL — ABNORMAL HIGH (ref 70–99)
Glucose-Capillary: 296 mg/dL — ABNORMAL HIGH (ref 70–99)
Glucose-Capillary: 237 mg/dL — ABNORMAL HIGH (ref 70–99)
Glucose-Capillary: 223 mg/dL — ABNORMAL HIGH (ref 70–99)
Glucose-Capillary: 208 mg/dL — ABNORMAL HIGH (ref 70–99)

## 2020-03-08 MED ORDER — SODIUM CHLORIDE 0.9 % IV SOLN: INTRAVENOUS | Status: DC

## 2020-03-08 NOTE — Progress Notes (Addendum)
PROGRESS NOTE  Nancy Hawkins VOZ:[id number] DOB: 11/20/1940 DOA: 03/05/2020 PCP: Lilian Coma., MD  HPI/Recap of past 24 hours: Nancy Hawkins is a 80 y.o. female with medical history significant of hypertension, hyperlipidemia, A. Fib, diabetes mellitus type 2, pancreatic cancer diagnosed in October 2021 and recent COVID-19 infection on 02/10/2019 presents with complaints of abdominal pain with nausea and vomiting over the last week.  She complains of having lower abdominal pain that she describes as crampy in nature with to 2-3 loose stools per day that are gray-colored.  Patient reports being unable to keep any significant amount of food or liquids down over the last 2 to 3 days which gave her concern because she is a diabetic.  She was seen in urgent care and noted to have elevated liver enzymes 2 days ago's.  Due to having Covid-19 it delayed her starting chemotherapy treatment.  Plan is for her to have the Port-A-Cath placed next week to initiate chemotherapy.  Subjective:  March 08 2020 Patient seen and examined at bedside her daughter Truman Hayward is  in the room.  She says she is doing well no abdominal pain she ate her food well.  She however would like to rescind her DNR order she wants to be made full code.  She is to have repeat ERCP tomorrow morning so she will be n.p.o. after midnight.  GI  March 07, 2020: Patient seen and examined at bedside.  Early in the morning she had gone for procedure but her husband Annie Main was in the room March 06, 2020 Seen and examined at bedside she stated she does not have any more nausea vomiting or abdominal pain. She tolerated her full liquid diet well she ate crackers Jell-O and some juices he says she is doing well. Patient has history of  pancreatic cancer and they thinks he may have outlet obstruction Patient is scheduled for ERCP tomorrow She is also Covid positive since March 08, 2020    Assessment/Plan: Principal Problem:   Nausea &  vomiting Active Problems:   Primary cancer of head of pancreas (HCC)   Abdominal pain   Diabetes mellitus type 2, uncontrolled ([place])   Protein malnutrition ([place])   Transaminitis   Hyperbilirubinemia  1. Intractable nausea and vomiting with abdominal pain patient has pancreatic cancer and there is a questionable pancreatic outlet obstruction. She had been n.p.o. and now tolerating full liquid. Gastroenterology did ERCP today but he seemed to be unsuccessful the planning on repeating it on Monday.  2. Hypertension blood pressure is slightly elevated we will restart her telmisartan and her other home medication  3. Type 2 diabetes mellitus her last hemoglobin A1c was 7.2 continue home medicine.  4.  Covid positive patient is asymptomatic  Code Status: FULL  Severity of Illness: The appropriate patient status for this patient is INPATIENT. Inpatient status is judged to be reasonable and necessary in order to provide the required intensity of service to ensure the patient's safety. The patient's presenting symptoms, physical exam findings, and initial radiographic and laboratory data in the context of their chronic comorbidities is felt to place them at high risk for further clinical deterioration. Furthermore, it is not anticipated that the patient will be medically stable for discharge from the hospital within 2 midnights of admission. The following factors support the patient status of inpatient.   " The patient's presenting symptoms include intractable nausea and vomiting. " The worrisome physical exam findings include possible pancreatic outlet obstruction given history  of pancreatic cancer. " The initial radiographic and laboratory data are worrisome because of elevated bilirubin. " The chronic co-morbidities include pancreatic cancer and COVID-19 positive   * I certify that at the point of admission it is my clinical judgment that the patient will require inpatient hospital care  spanning beyond 2 midnights from the point of admission due to high intensity of service, high risk for further deterioration and high frequency of surveillance required.*    Family Communication: Patient/husband  Disposition Plan: To be determined   Consultants:  Gastroenterology  Procedures:  None  Antimicrobials:  Ciprofloxacin  DVT prophylaxis: Lovenox on hold for procedure   Objective: Vitals:   03/07/20 1602 03/07/20 1929 03/08/20 0337 03/08/20 1146  BP: 132/70 (!) 151/59 129/61 (!) 120/91  Pulse: 62 64 67 66  Resp: 16 17 16 17   Temp: 98.4 F (36.9 C) 97.8 F (36.6 C) (!) 97.4 F (36.3 C) (!) 97.5 F (36.4 C)  TempSrc: Oral Oral Oral Oral  SpO2: 95% 98% 100%   Weight:      Height:        Intake/Output Summary (Last 24 hours) at 03/08/2020 1543 Last data filed at 03/08/2020 1300 Gross per 24 hour  Intake 720 ml  Output --  Net 720 ml   Filed Weights   03/05/20 0842 03/07/20 1157  Weight: 58.5 kg 58.5 kg   Body mass index is 22.49 kg/m.  Exam:  . General: 80 y.o. year-old female well developed well nourished in no acute distress.  Alert and oriented x3. . Cardiovascular: Regular rate and rhythm with no rubs or gallops.  No thyromegaly or JVD noted.   Marland Kitchen Respiratory: Clear to auscultation with no wheezes or rales. Good inspiratory effort. . Abdomen: Soft nontender nondistended with normal bowel sounds x4 quadrants. . Musculoskeletal: No lower extremity edema. 2/4 pulses in all 4 extremities. . Skin: No ulcerative lesions noted or rashes, . Psychiatry: Mood is appropriate for condition and setting    Data Reviewed: CBC: Recent Labs  Lab 03/05/20 0857 03/06/20 0355 03/08/20 0317  WBC 6.2 9.0 7.5  NEUTROABS  --   --  5.2  HGB 11.3* 10.9* 9.9*  HCT 35.3* 33.2* 28.7*  MCV 89.8 89.7 84.7  PLT 263 258 [id number]   Basic Metabolic Panel: Recent Labs  Lab 03/03/20 1801 03/05/20 0857 03/06/20 0355  NA 136 139 138  K 3.8 3.7 3.7  CL 103 106 106   CO2 20 21* 17*  GLUCOSE 111* 151* 143*  BUN 12 14 12   CREATININE 0.46* 0.63 0.50  CALCIUM 8.4* 8.4* 8.1*   GFR: Estimated Creatinine Clearance: 48.2 mL/min (by C-G formula based on SCr of 0.5 mg/dL). Liver Function Tests: Recent Labs  Lab 03/03/20 1801 03/05/20 0857  AST 364* 267*  ALT 331* 318*  ALKPHOS 853* 748*  BILITOT 1.9* 4.0*  PROT 6.1 6.4*  ALBUMIN 3.1* 2.6*   Recent Labs  Lab 03/03/20 1801 03/05/20 0857  LIPASE 5* 12  AMYLASE 9*  --    No results for input(s): AMMONIA in the last 168 hours. Coagulation Profile: Recent Labs  Lab 03/05/20 0942  INR 1.2   Cardiac Enzymes: No results for input(s): CKTOTAL, CKMB, CKMBINDEX, TROPONINI in the last 168 hours. BNP (last 3 results) No results for input(s): PROBNP in the last 8760 hours. HbA1C: No results for input(s): HGBA1C in the last 72 hours. CBG: Recent Labs  Lab 03/07/20 1615 03/07/20 2145 03/08/20 0643 03/08/20 1151 03/08/20 1525  GLUCAP 230* 269*  277* 296* 237*   Lipid Profile: No results for input(s): CHOL, HDL, LDLCALC, TRIG, CHOLHDL, LDLDIRECT in the last 72 hours. Thyroid Function Tests: No results for input(s): TSH, T4TOTAL, FREET4, T3FREE, THYROIDAB in the last 72 hours. Anemia Panel: No results for input(s): VITAMINB12, FOLATE, FERRITIN, TIBC, IRON, RETICCTPCT in the last 72 hours. Urine analysis:    Component Value Date/Time   COLORURINE AMBER (A) 03/05/2020 0852   APPEARANCEUR HAZY (A) 03/05/2020 0852   LABSPEC 1.013 03/05/2020 0852   PHURINE 6.0 03/05/2020 0852   GLUCOSEU NEGATIVE 03/05/2020 0852   HGBUR NEGATIVE 03/05/2020 0852   BILIRUBINUR SMALL (A) 03/05/2020 0852   BILIRUBINUR small (A) 11/17/2019 0906   KETONESUR 20 (A) 03/05/2020 0852   PROTEINUR NEGATIVE 03/05/2020 0852   UROBILINOGEN 0.2 11/17/2019 0906   NITRITE NEGATIVE 03/05/2020 0852   LEUKOCYTESUR NEGATIVE 03/05/2020 0852   Sepsis Labs: @LABRCNTIP (procalcitonin:4,lacticidven:4)  ) Recent Results (from the  past 240 hour(s))  SARS Coronavirus 2 by RT PCR (hospital order, performed in [place] hospital lab) Nasopharyngeal Nasopharyngeal Swab     Status: Abnormal   Collection Time: 03/05/20  4:16 PM   Specimen: Nasopharyngeal Swab  Result Value Ref Range Status   SARS Coronavirus 2 POSITIVE (A) NEGATIVE Final    Comment: RESULT CALLED TO, READ BACK BY AND VERIFIED WITH: Z CAGLE RN [id number] 03/05/20 A BROWNING (NOTE) SARS-CoV-2 target nucleic acids are DETECTED  SARS-CoV-2 RNA is generally detectable in upper respiratory specimens  during the acute phase of infection.  Positive results are indicative  of the presence of the identified virus, but do not rule out bacterial infection or co-infection with other pathogens not detected by the test.  Clinical correlation with patient history and  other diagnostic information is necessary to determine patient infection status.  The expected result is negative.  Fact Sheet for Patients:   StrictlyIdeas.no   Fact Sheet for Healthcare Providers:   BankingDealers.co.za    This test is not yet approved or cleared by the [place] FDA and  has been authorized for detection and/or diagnosis of SARS-CoV-2 by FDA under an Emergency Use Authorization (EUA).  This EUA will remain in effect (meaning this te st can be used) for the duration of  the COVID-19 declaration under Section 564(b)(1) of the Act, 21 U.S.C. section 360-bbb-3(b)(1), unless the authorization is terminated or revoked sooner.  Performed at [facility], [place] [address] [place], [place] [address]       Studies: No results found.  Scheduled Meds: . diltiazem  120 mg Oral Daily  . feeding supplement  237 mL Oral BID BM  . indomethacin  100 mg Rectal Once  . insulin aspart  0-5 Units Subcutaneous QHS  . insulin aspart  0-9 Units Subcutaneous TID WC  . irbesartan  150 mg Oral Daily  . melatonin  3 mg Oral QHS  . multivitamin  with minerals  1 tablet Oral Daily  . sodium chloride flush  3 mL Intravenous Q12H    Continuous Infusions:    LOS: 3 days     Cristal Deer, MD [facility]  To reach me or the doctor on call, go to: [email or web address] Password [facility]  03/08/2020, 3:43 PM

## 2020-03-08 NOTE — Progress Notes (Signed)
Subjective: No acute events.  She feels well.  No complaints of abdominal pain.  Objective: Vital signs in last 24 hours: Temp:  [97 F (36.1 C)-98.4 F (36.9 C)] 97.4 F (36.3 C) (01/30 0337) Pulse Rate:  [62-82] 67 (01/30 0337) Resp:  [16-24] 16 (01/30 0337) BP: (129-167)/(50-70) 129/61 (01/30 0337) SpO2:  [95 %-100 %] 100 % (01/30 0337) Weight:  [58.5 kg] 58.5 kg (01/29 1157) Last BM Date: 03/05/20  Intake/Output from previous day: 01/29 0701 - 01/30 0700 In: 400 [I.V.:400] Out: -  Intake/Output this shift: No intake/output data recorded.  General appearance: alert and no distress GI: soft, non-tender; bowel sounds normal; no masses,  no organomegaly  Lab Results: Recent Labs    03/05/20 0857 03/06/20 0355 03/08/20 0317  WBC 6.2 9.0 7.5  HGB 11.3* 10.9* 9.9*  HCT 35.3* 33.2* 28.7*  PLT 263 258 200   BMET Recent Labs    03/05/20 0857 03/06/20 0355  NA 139 138  K 3.7 3.7  CL 106 106  CO2 21* 17*  GLUCOSE 151* 143*  BUN 14 12  CREATININE 0.63 0.50  CALCIUM 8.4* 8.1*   LFT Recent Labs    03/05/20 0857  PROT 6.4*  ALBUMIN 2.6*  AST 267*  ALT 318*  ALKPHOS 748*  BILITOT 4.0*   PT/INR Recent Labs    03/05/20 0942  LABPROT 14.4  INR 1.2   Hepatitis Panel Recent Labs    03/05/20 0942  HEPBSAG NON REACTIVE  HCVAB NON REACTIVE  HEPAIGM Reactive*  HEPBIGM NON REACTIVE   C-Diff No results for input(s): CDIFFTOX in the last 72 hours. Fecal Lactopherrin No results for input(s): FECLLACTOFRN in the last 72 hours.  Studies/Results: DG C-Arm 1-60 Min-No Report  Result Date: 03/07/2020 Fluoroscopy was utilized by the requesting physician.  No radiographic interpretation.    Medications:  Scheduled: . diltiazem  120 mg Oral Daily  . feeding supplement  237 mL Oral BID BM  . indomethacin  100 mg Rectal Once  . insulin aspart  0-5 Units Subcutaneous QHS  . insulin aspart  0-9 Units Subcutaneous TID WC  . irbesartan  150 mg Oral Daily  .  melatonin  3 mg Oral QHS  . multivitamin with minerals  1 tablet Oral Daily  . sodium chloride flush  3 mL Intravenous Q12H   Continuous: . sodium chloride 75 mL/hr at 03/08/20 0721    Assessment/Plan: 1) Pancreatic cancer. 2) Biliary obstruction.   She did not experience any post-ERCP complications.  In retrospect the minor ampulla was cannulated.  It appeared to be the major papilla as it was extruding pus.  She is stable and she will undergo a repeat ERCP with Dr. Mansouraty tomorrow.  Plan: 1) ERCP with Mansouraty 03/09/2020. 2) Regular diet today and then NPO after midnight.  LOS: 3 days   Nancy Hawkins D 03/08/2020, 8:07 AM 

## 2020-03-08 NOTE — Anesthesia Preprocedure Evaluation (Addendum)
Anesthesia Evaluation  Patient identified by MRN, date of birth, ID band Patient awake  General Assessment Comment: Jaundice  Reviewed: Allergy & Precautions, NPO status , Patient's Chart, lab work & pertinent test results  History of Anesthesia Complications Negative for: history of anesthetic complications  Airway Mallampati: III  TM Distance: >3 FB Neck ROM: Full    Dental  (+) Dental Advisory Given, Partial Lower   Pulmonary neg pulmonary ROS,    Pulmonary exam normal        Cardiovascular hypertension, Pt. on medications Normal cardiovascular exam+ dysrhythmias Atrial Fibrillation      Neuro/Psych negative neurological ROS  negative psych ROS   GI/Hepatic GERD  Controlled, Elevated LFTs   Pancreatic cancer    Endo/Other  diabetes, Insulin Dependent Hyponatremia, Na 130 Hypokalemia, K 3.1 Hypocalcemia, Ca 7.9  Renal/GU negative Renal ROS     Musculoskeletal negative musculoskeletal ROS (+)   Abdominal   Peds  Hematology  (+) anemia ,   Anesthesia Other Findings Covid+ 02/11/20, retested 03/05/20 still +, asymptomatic     Reproductive/Obstetrics                            Anesthesia Physical Anesthesia Plan  ASA: III  Anesthesia Plan: General   Post-op Pain Management:    Induction: Intravenous  PONV Risk Score and Plan: 3 and Treatment may vary due to age or medical condition, Ondansetron and Dexamethasone  Airway Management Planned: Oral ETT  Additional Equipment: None  Intra-op Plan:   Post-operative Plan: Extubation in OR  Informed Consent: I have reviewed the patients History and Physical, chart, labs and discussed the procedure including the risks, benefits and alternatives for the proposed anesthesia with the patient or authorized representative who has indicated his/her understanding and acceptance.     Dental advisory given  Plan Discussed with: CRNA  and Anesthesiologist  Anesthesia Plan Comments:        Anesthesia Quick Evaluation

## 2020-03-08 NOTE — H&P (View-Only) (Signed)
Subjective: No acute events.  She feels well.  No complaints of abdominal pain.  Objective: Vital signs in last 24 hours: Temp:  [97 F (36.1 C)-98.4 F (36.9 C)] 97.4 F (36.3 C) (01/30 0337) Pulse Rate:  [62-82] 67 (01/30 0337) Resp:  [16-24] 16 (01/30 0337) BP: (129-167)/(50-70) 129/61 (01/30 0337) SpO2:  [95 %-100 %] 100 % (01/30 0337) Weight:  [58.5 kg] 58.5 kg (01/29 1157) Last BM Date: 03/05/20  Intake/Output from previous day: 01/29 0701 - 01/30 0700 In: 400 [I.V.:400] Out: -  Intake/Output this shift: No intake/output data recorded.  General appearance: alert and no distress GI: soft, non-tender; bowel sounds normal; no masses,  no organomegaly  Lab Results: Recent Labs    03/05/20 0857 03/06/20 0355 03/08/20 0317  WBC 6.2 9.0 7.5  HGB 11.3* 10.9* 9.9*  HCT 35.3* 33.2* 28.7*  PLT 263 258 200   BMET Recent Labs    03/05/20 0857 03/06/20 0355  NA 139 138  K 3.7 3.7  CL 106 106  CO2 21* 17*  GLUCOSE 151* 143*  BUN 14 12  CREATININE 0.63 0.50  CALCIUM 8.4* 8.1*   LFT Recent Labs    03/05/20 0857  PROT 6.4*  ALBUMIN 2.6*  AST 267*  ALT 318*  ALKPHOS 748*  BILITOT 4.0*   PT/INR Recent Labs    03/05/20 0942  LABPROT 14.4  INR 1.2   Hepatitis Panel Recent Labs    03/05/20 0942  HEPBSAG NON REACTIVE  HCVAB NON REACTIVE  HEPAIGM Reactive*  HEPBIGM NON REACTIVE   C-Diff No results for input(s): CDIFFTOX in the last 72 hours. Fecal Lactopherrin No results for input(s): FECLLACTOFRN in the last 72 hours.  Studies/Results: DG C-Arm 1-60 Min-No Report  Result Date: 03/07/2020 Fluoroscopy was utilized by the requesting physician.  No radiographic interpretation.    Medications:  Scheduled: . diltiazem  120 mg Oral Daily  . feeding supplement  237 mL Oral BID BM  . indomethacin  100 mg Rectal Once  . insulin aspart  0-5 Units Subcutaneous QHS  . insulin aspart  0-9 Units Subcutaneous TID WC  . irbesartan  150 mg Oral Daily  .  melatonin  3 mg Oral QHS  . multivitamin with minerals  1 tablet Oral Daily  . sodium chloride flush  3 mL Intravenous Q12H   Continuous: . sodium chloride 75 mL/hr at 03/08/20 4166    Assessment/Plan: 1) Pancreatic cancer. 2) Biliary obstruction.   She did not experience any post-ERCP complications.  In retrospect the minor ampulla was cannulated.  It appeared to be the major papilla as it was extruding pus.  She is stable and she will undergo a repeat ERCP with Dr. Rush Landmark tomorrow.  Plan: 1) ERCP with Mansouraty 03/09/2020. 2) Regular diet today and then NPO after midnight.  LOS: 3 days   Tremane Spurgeon D 03/08/2020, 8:07 AM

## 2020-03-09 ENCOUNTER — Ambulatory Visit: Payer: Self-pay | Admitting: Surgery

## 2020-03-09 ENCOUNTER — Encounter (HOSPITAL_COMMUNITY)
Admission: EM | Disposition: A | Discharge status: Home / Self Care | Payer: Self-pay | Source: Home / Self Care | Attending: Family Medicine

## 2020-03-09 ENCOUNTER — Inpatient Hospital Stay (HOSPITAL_COMMUNITY): Payer: Medicare Other | Admitting: Anesthesiology

## 2020-03-09 ENCOUNTER — Encounter (HOSPITAL_COMMUNITY): Payer: Self-pay | Admitting: Internal Medicine

## 2020-03-09 ENCOUNTER — Inpatient Hospital Stay (HOSPITAL_COMMUNITY): Payer: Medicare Other

## 2020-03-09 DIAGNOSIS — C250 Malignant neoplasm of head of pancreas: Secondary | ICD-10-CM | POA: Diagnosis not present

## 2020-03-09 HISTORY — PX: ERCP: SHX5425

## 2020-03-09 HISTORY — PX: SPHINCTEROTOMY: SHX5544

## 2020-03-09 HISTORY — PX: BILIARY DILATION: SHX6850

## 2020-03-09 HISTORY — PX: BILIARY STENT PLACEMENT: SHX5538

## 2020-03-09 LAB — GLUCOSE, CAPILLARY
Glucose-Capillary: 198 mg/dL — ABNORMAL HIGH (ref 70–99)
Glucose-Capillary: 205 mg/dL — ABNORMAL HIGH (ref 70–99)
Glucose-Capillary: 303 mg/dL — ABNORMAL HIGH (ref 70–99)
Glucose-Capillary: 313 mg/dL — ABNORMAL HIGH (ref 70–99)
Glucose-Capillary: 319 mg/dL — ABNORMAL HIGH (ref 70–99)

## 2020-03-09 LAB — MRSA PCR SCREENING: MRSA by PCR: NEGATIVE

## 2020-03-09 IMAGING — RF DG ERCP WO/W SPHINCTEROTOMY
1 series · 12 of 12 positions shown · non-contrast
Comparison: CT abdomen pelvis-[DATE]

FLUOROSCOPY TIME:  5 minutes, 48 seconds

CLINICAL DATA: Pancreatic head mass.  Post ERCP.

EXAM:
ERCP
TECHNIQUE: Multiple spot images obtained with the fluoroscopic device and
submitted for interpretation post-procedure.

[Series 1: unknown protocol · 0.20mm/px · 12 of 12 slices shown]
[im 1/12]
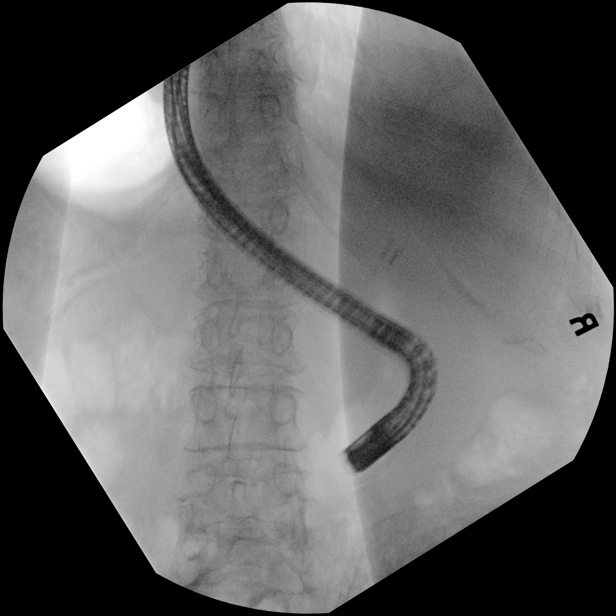
[im 2/12]
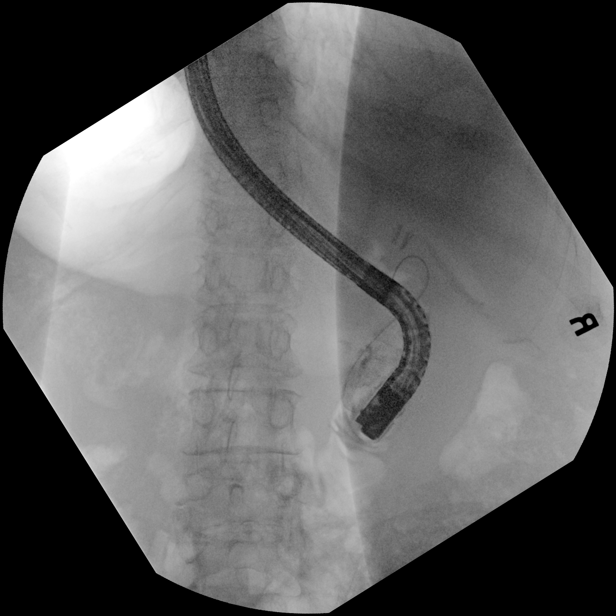
[im 3/12]
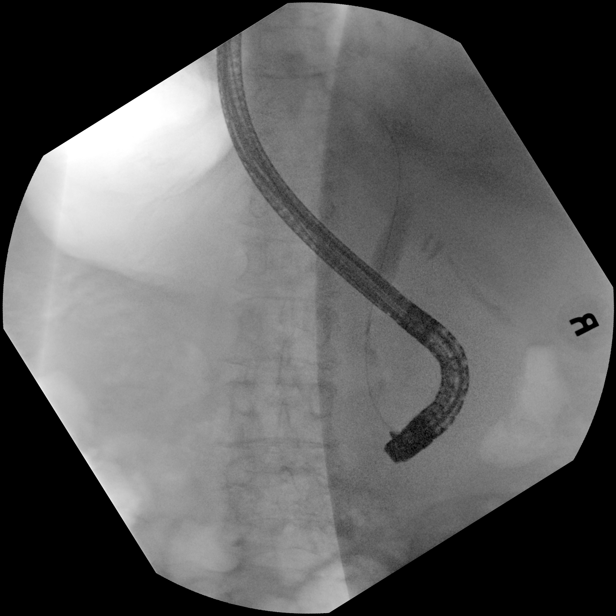
[im 4/12]
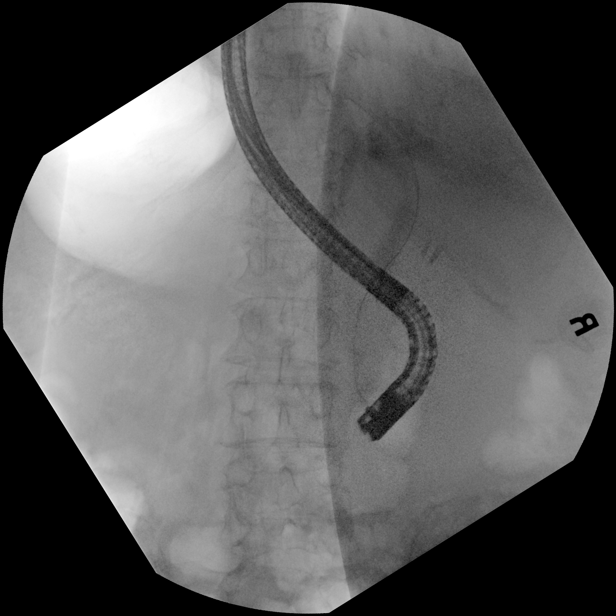
[im 5/12]
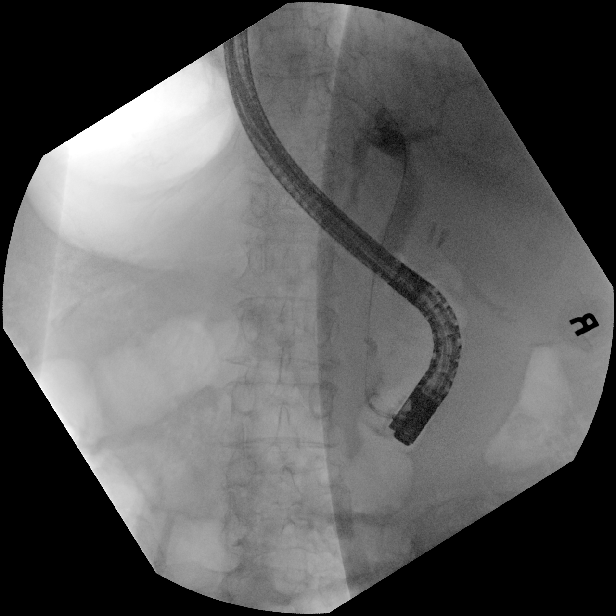
[im 6/12]
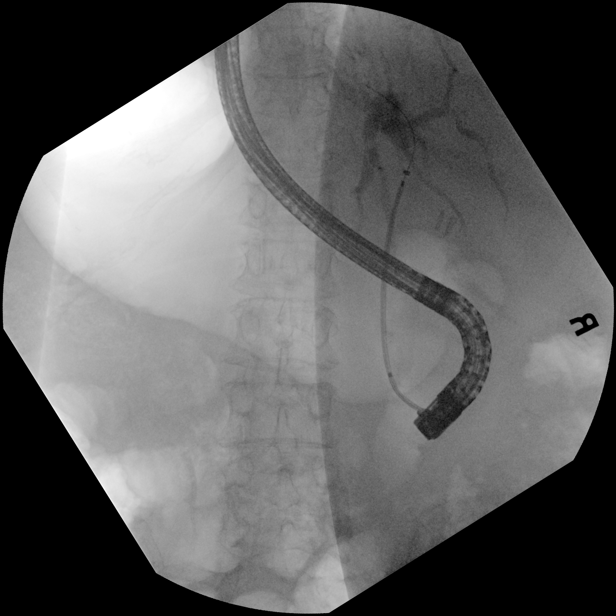
[im 7/12]
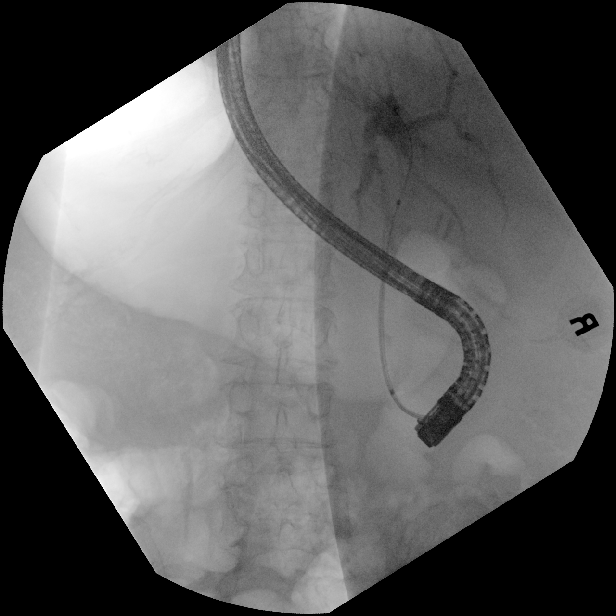
[im 8/12]
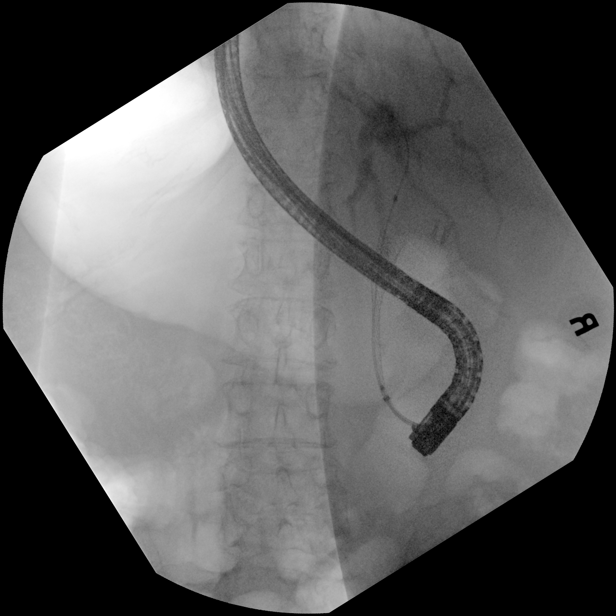
[im 9/12]
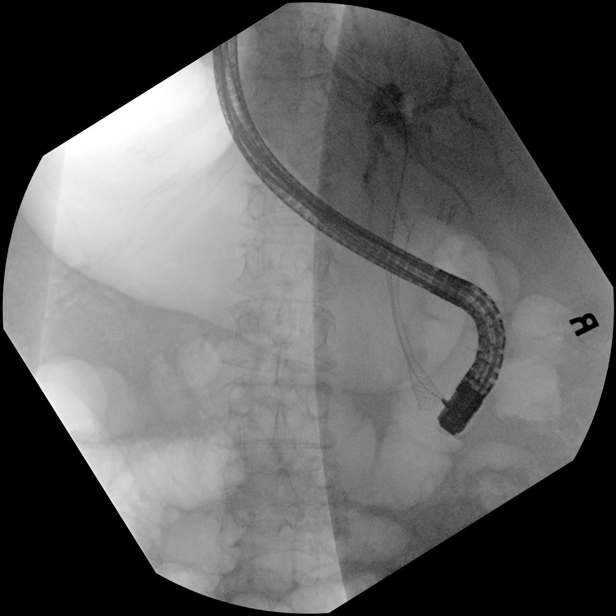
[im 10/12]
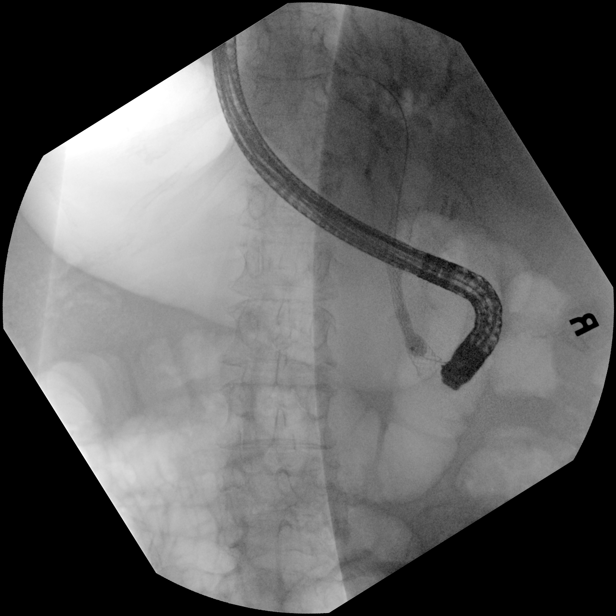
[im 11/12]
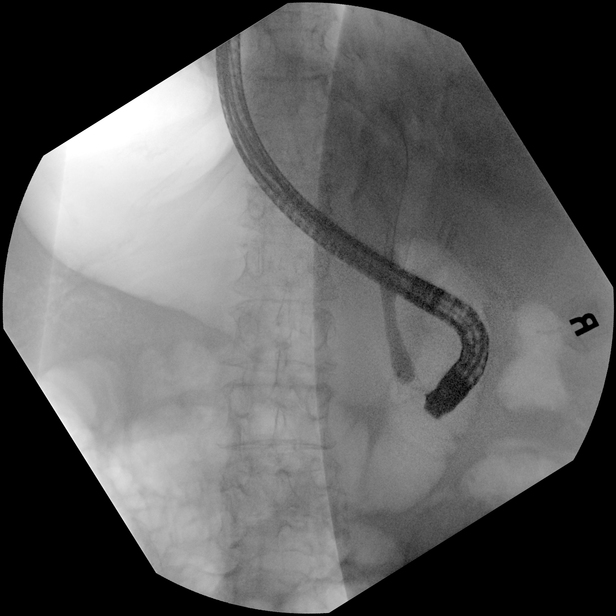
[im 12/12]
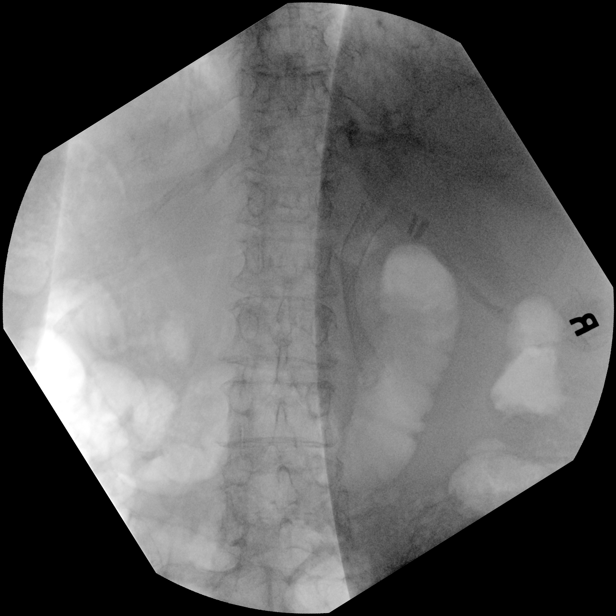

[12 of 12 positions shown; findings below may reference images not displayed]

FINDINGS: Twelve spot intraoperative fluoroscopic images the right upper
abdominal quadrant ERCP are provided for review.

Initial image demonstrates an ERCP probe overlying the right upper
abdominal quadrant. Cholecystectomy clips overlies expected location
of the gallbladder fossa

There is selective cannulation and opacification the common bile
duct which appears moderately dilated centrally.

Subsequent images demonstrate placement of an internal metallic
biliary stent overlying expected location of the CBD.

Subsequent images demonstrate biliary plasty within the mid aspect
of the stent with improved stent opening.

There is minimal opacification intrahepatic biliary tree which
appears mildly dilated. There is no definitive opacification of the
residual cystic duct or the pancreatic duct.
IMPRESSION: ERCP with internal biliary stent placement as above.

These images were submitted for radiologic interpretation only.
Please see the procedural report for the amount of contrast and the
fluoroscopy time utilized.

## 2020-03-09 SURGERY — ERCP, WITH INTERVENTION IF INDICATED
Anesthesia: General

## 2020-03-09 MED ORDER — CIPROFLOXACIN IN D5W 400 MG/200ML IV SOLN
INTRAVENOUS | Status: DC | PRN
  Administered 2020-03-09: 400 mg via INTRAVENOUS

## 2020-03-09 MED ORDER — LACTATED RINGERS IV SOLN
INTRAVENOUS | Status: AC | PRN
  Administered 2020-03-09: 20 mL/h via INTRAVENOUS

## 2020-03-09 MED ORDER — DEXAMETHASONE SODIUM PHOSPHATE 10 MG/ML IJ SOLN
INTRAMUSCULAR | Status: DC | PRN
  Administered 2020-03-09: 5 mg via INTRAVENOUS

## 2020-03-09 MED ORDER — INDOMETHACIN 50 MG RE SUPP
RECTAL | Status: AC
  Filled 2020-03-09: qty 2

## 2020-03-09 MED ORDER — FENTANYL CITRATE (PF) 100 MCG/2ML IJ SOLN: 25.0000 ug | INTRAMUSCULAR | Status: DC | PRN

## 2020-03-09 MED ORDER — OXYCODONE HCL 5 MG/5ML PO SOLN: 5.0000 mg | Freq: Once | ORAL | Status: DC | PRN

## 2020-03-09 MED ORDER — PHENYLEPHRINE HCL-NACL 10-0.9 MG/250ML-% IV SOLN
INTRAVENOUS | Status: DC | PRN
  Administered 2020-03-09: 35 ug/min via INTRAVENOUS

## 2020-03-09 MED ORDER — SODIUM CHLORIDE 0.9 % IV SOLN
INTRAVENOUS | Status: DC | PRN
  Administered 2020-03-09: 50 mL

## 2020-03-09 MED ORDER — ROCURONIUM BROMIDE 10 MG/ML (PF) SYRINGE
PREFILLED_SYRINGE | INTRAVENOUS | Status: DC | PRN
  Administered 2020-03-09: 50 mg via INTRAVENOUS

## 2020-03-09 MED ORDER — PHENYLEPHRINE 40 MCG/ML (10ML) SYRINGE FOR IV PUSH (FOR BLOOD PRESSURE SUPPORT)
PREFILLED_SYRINGE | INTRAVENOUS | Status: DC | PRN
  Administered 2020-03-09: 120 ug via INTRAVENOUS
  Administered 2020-03-09: 80 ug via INTRAVENOUS

## 2020-03-09 MED ORDER — GLUCAGON HCL RDNA (DIAGNOSTIC) 1 MG IJ SOLR
INTRAMUSCULAR | Status: AC
  Filled 2020-03-09: qty 1

## 2020-03-09 MED ORDER — LIDOCAINE 2% (20 MG/ML) 5 ML SYRINGE
INTRAMUSCULAR | Status: DC | PRN
  Administered 2020-03-09: 40 mg via INTRAVENOUS

## 2020-03-09 MED ORDER — GLUCAGON HCL RDNA (DIAGNOSTIC) 1 MG IJ SOLR
INTRAMUSCULAR | Status: DC | PRN
  Administered 2020-03-09 (×2): .25 mg via INTRAVENOUS

## 2020-03-09 MED ORDER — ONDANSETRON HCL 4 MG/2ML IJ SOLN
INTRAMUSCULAR | Status: DC | PRN
  Administered 2020-03-09: 4 mg via INTRAVENOUS

## 2020-03-09 MED ORDER — FENTANYL CITRATE (PF) 250 MCG/5ML IJ SOLN
INTRAMUSCULAR | Status: DC | PRN
  Administered 2020-03-09: 50 ug via INTRAVENOUS

## 2020-03-09 MED ORDER — ONDANSETRON HCL 4 MG/2ML IJ SOLN
INTRAMUSCULAR | Status: AC
  Filled 2020-03-09: qty 2

## 2020-03-09 MED ORDER — PROPOFOL 10 MG/ML IV BOLUS
INTRAVENOUS | Status: DC | PRN
  Administered 2020-03-09: 30 mg via INTRAVENOUS
  Administered 2020-03-09: 90 mg via INTRAVENOUS

## 2020-03-09 MED ORDER — OXYCODONE HCL 5 MG PO TABS: 5.0000 mg | ORAL_TABLET | Freq: Once | ORAL | Status: DC | PRN

## 2020-03-09 MED ORDER — CIPROFLOXACIN IN D5W 400 MG/200ML IV SOLN
INTRAVENOUS | Status: AC
  Filled 2020-03-09: qty 200

## 2020-03-09 MED ORDER — INSULIN DETEMIR 100 UNIT/ML ~~LOC~~ SOLN
12.0000 [IU] | Freq: Every day | SUBCUTANEOUS | Status: DC
  Administered 2020-03-09 – 2020-03-12 (×3): 12 [IU] via SUBCUTANEOUS
  Filled 2020-03-09 (×4): qty 0.12

## 2020-03-09 MED ORDER — SUGAMMADEX SODIUM 200 MG/2ML IV SOLN
INTRAVENOUS | Status: DC | PRN
  Administered 2020-03-09: 200 mg via INTRAVENOUS

## 2020-03-09 MED ORDER — INDOMETHACIN 50 MG RE SUPP
RECTAL | Status: DC | PRN
  Administered 2020-03-09: 100 mg via RECTAL

## 2020-03-09 MED ORDER — INSULIN ASPART 100 UNIT/ML ~~LOC~~ SOLN
3.0000 [IU] | Freq: Three times a day (TID) | SUBCUTANEOUS | Status: DC
  Administered 2020-03-10 – 2020-03-12 (×6): 3 [IU] via SUBCUTANEOUS

## 2020-03-09 MED ORDER — ONDANSETRON HCL 4 MG/2ML IJ SOLN
4.0000 mg | Freq: Once | INTRAMUSCULAR | Status: AC | PRN
  Administered 2020-03-09: 4 mg via INTRAVENOUS

## 2020-03-09 NOTE — Anesthesia Postprocedure Evaluation (Signed)
Anesthesia Post Note  Patient: Nancy Hawkins  Procedure(s) Performed: ENDOSCOPIC RETROGRADE CHOLANGIOPANCREATOGRAPHY (ERCP) (N/A ) SPHINCTEROTOMY BILIARY STENT PLACEMENT BILIARY DILATION     Patient location during evaluation: PACU Anesthesia Type: General Level of consciousness: awake and alert Pain management: pain level controlled Vital Signs Assessment: post-procedure vital signs reviewed and stable Respiratory status: spontaneous breathing, nonlabored ventilation and respiratory function stable Cardiovascular status: blood pressure returned to baseline and stable Postop Assessment: no apparent nausea or vomiting Anesthetic complications: no   No complications documented.  Last Vitals:  Vitals:   03/09/20 0921 03/09/20 0952  BP: (!) 114/41 (!) 123/56  Pulse: 70 79  Resp: (!) 24 19  Temp:  (!) 36.3 C  SpO2: 97% 97%    Last Pain:  Vitals:   03/09/20 0952  TempSrc: Oral  PainSc: 0-No pain                 Audry Pili

## 2020-03-09 NOTE — Progress Notes (Signed)
Pt tolerated clear liquid diet well, denies nausea or vomiting, advanced to heart healthy diet per order.

## 2020-03-09 NOTE — Op Note (Signed)
[facility] Patient Name: Nancy Hawkins Procedure Date : 03/09/2020 MRN: [id number] Attending MD: [name] , MD Date of Birth: [birth year] CSN: [id number] Age: 80 Admit Type: Inpatient Procedure:                ERCP Indications:              Malignant stricture of the common bile duct,                            Jaundice, Abnormal liver function test, Malignant                            tumor of the head of pancreas Providers:                [name], MD, Jeanella Cara, RN,                            Elspeth Cho Tech., Technician Referring MD:             Blanchard Mane. Karin Lieu B. Marcille Blanco,                            MD, Docia Chuck. Henrene Pastor, MD, [facility] Medicines:                General Anesthesia, Cipro 400 mg IV, Indomethacin                            100 mg PR, Glucagon 0.5 mg IV Complications:            No immediate complications. Estimated Blood Loss:     Estimated blood loss was minimal. Procedure:                Pre-Anesthesia Assessment:                           - Prior to the procedure, a History and Physical                            was performed, and patient medications and                            allergies were reviewed. The patient's tolerance of                            previous anesthesia was also reviewed. The risks                            and benefits of the procedure and the sedation                            options and risks were discussed with the patient.                            All questions were answered, and informed consent  was obtained. Prior Anticoagulants: The patient has                            taken no previous anticoagulant or antiplatelet                            agents except for NSAID medication. ASA Grade                            Assessment: III - A patient with severe systemic                            disease. After reviewing the risks and  benefits,                            the patient was deemed in satisfactory condition to                            undergo the procedure.                           After obtaining informed consent, the scope was                            passed under direct vision. Throughout the                            procedure, the patient's blood pressure, pulse, and                            oxygen saturations were monitored continuously. The                            TJF- Q180V ([id number]) Olympus duodenoscope was                            introduced through the mouth, and used to inject                            contrast into and used to locate the major papilla.                            The ERCP was technically difficult and complex due                            to challenging cannulation. Successful completion                            of the procedure was aided by performing the                            maneuvers documented (below) in this report. The  patient tolerated the procedure. Scope In: Scope Out: Findings:      A scout film of the abdomen was obtained. Surgical clips, consistent       with a previous cholecystectomy, were seen in the area of the right       upper quadrant of the abdomen.      The esophagus was successfully intubated under direct vision without       detailed examination of the pharynx, larynx, and associated structures,       and upper GI tract. A minor papilla precut sphincterotomy had been       performed. The major papilla was normal.      Initially cannulation with the wire alone was not succesful. However,       after going into a long-position and carefully bowing the       sphincterotome, a short 0.035 inch Soft Antonietta Breach was passed into the       biliary tree, initially was in presumed cystic duct for a few wire       placements, but after further bowing of the sphincterotome, the wire       passed into the intrahepatics.  The Hydratome sphincterotome was passed       over the guidewire and the bile duct was then deeply cannulated.       Contrast was injected. I personally interpreted the bile duct images.       Ductal flow of contrast was adequate. Image quality was adequate.       Contrast extended to the hepatic ducts. Opacification of the entire       biliary tree except for the gallbladder was successful. The lower third       of the main duct was completely obstructed by what appeared to be a mass       causing a stricture of 3.5 cm of length. The upper third of the main       bile duct and left and right hepatic ducts and all intrahepatic branches       were moderately dilated, secondary to aforementioned stricture. The       largest diameter was 14 mm. A 6 mm biliary sphincterotomy was made with       a monofilament Hydratome sphincterotome using ERBE electrocautery. There       was self limited oozing from the sphincterotomy which did not require       treatment. As patient is still felt to be a potential Whipple candidate       as of last discussions (though there now are some changes in the recent       imaging, one 10 mm by 6 cm covered metal biliary stent was placed into       the common bile duct. Bile flowed through the stent. The stent was in       good position but there was a significant waist in the strictured       region. To ensure adequate drainage, dilation of the common bile duct       stent with an 09-15-08 mm balloon (to a maximum balloon size of 9 mm)       dilator was successful with good improvement in the waist of the stent.      A pancreatogram was not performed.      The duodenoscope was withdrawn from the patient. Impression:               - Prior  minor papilla sphincterotomy noted.                           - The major papilla appeared normal.                           - A biliary tract obstruction secondary to the                            known pancreatic head mass was found  in the lower                            third of the main duct.                           - The upper third of the main bile duct and left                            and right hepatic ducts and all intrahepatic                            branches were moderately dilated, secondary to the                            stricture.                           - A biliary sphincterotomy was performed.                           - One covered metal biliary stent was placed into                            the common bile duct. This was dilated to improve                            the waist of the stent after placement with good                            effect. Recommendation:           - The patient will be observed post-procedure,                            until all discharge criteria are met.                           - Return patient to hospital ward for ongoing care.                           - Advance diet as tolerated.                           - Check liver enzymes (AST, ALT, alkaline  phosphatase, bilirubin) in the morning.                           - Observe patient's clinical course.                           - Would hold chemical VTE prophylaxis for 24 hours                            to decrease risk of post-interventional bleeding.                            If anticoagulation is necessary consider heparin                            drip without bolus in 6-12 hours and monitor                            closely.                           - Watch for pancreatitis, bleeding, perforation,                            and cholangitis.                           - Repeat LFTs at [place] or with Oncology clinic                            in 2-weeks upon discharge.                           - The findings and recommendations were discussed                            with the patient.                           - The findings and recommendations were discussed                             with the patient's family. Procedure Code(s):        --- Professional ---                           [phone], Esophagogastroduodenoscopy, flexible,                            transoral; diagnostic, including collection of                            specimen(s) by brushing or washing, when performed                            (separate procedure) Diagnosis Code(s):        ---  Professional ---                           K83.1, Obstruction of bile duct                           R17, Unspecified jaundice                           R94.5, Abnormal results of liver function studies                           C25.0, Malignant neoplasm of head of pancreas CPT copyright 2019 American [employer] Association. All rights reserved. The codes documented in this report are preliminary and upon coder review may  be revised to meet current compliance requirements. [name], MD 03/09/2020 9:16:51 AM Number of Addenda: 0

## 2020-03-09 NOTE — Interval H&P Note (Signed)
History and Physical Interval Note:  03/09/2020 7:30 AM  Nancy Hawkins  has presented today for surgery, with the diagnosis of Pancreatic cancer with biliary obstruction.  The various methods of treatment have been discussed with the patient and family. After consideration of risks, benefits and other options for treatment, the patient has consented to  Procedure(s): ENDOSCOPIC RETROGRADE CHOLANGIOPANCREATOGRAPHY (ERCP) (N/A) as a surgical intervention.  The patient's history has been reviewed, patient examined, no change in status, stable for surgery.  I have reviewed the patient's chart and labs.  Questions were answered to the patient's satisfaction.    The risks of an ERCP were discussed at length, including but not limited to the risk of perforation, bleeding, abdominal pain, post-ERCP pancreatitis (while usually mild can be severe and even life threatening).    [employer]

## 2020-03-09 NOTE — Transfer of Care (Signed)
Immediate Anesthesia Transfer of Care Note  Patient: Nancy Hawkins  Procedure(s) Performed: ENDOSCOPIC RETROGRADE CHOLANGIOPANCREATOGRAPHY (ERCP) (N/A ) SPHINCTEROTOMY BILIARY STENT PLACEMENT BILIARY DILATION  Patient Location: ENDO  Anesthesia Type:General  Level of Consciousness: drowsy and responds to stimulation  Airway & Oxygen Therapy: Patient Spontanous Breathing  Post-op Assessment: Report given to RN, Post -op Vital signs reviewed and stable and Patient moving all extremities  Post vital signs: Reviewed and stable  Last Vitals:  Vitals Value Taken Time  BP 100/60   Temp    Pulse 74 03/09/20 0901  Resp 14 03/09/20 0902  SpO2 97 % 03/09/20 0901  Vitals shown include unvalidated device data.  Last Pain:  Vitals:   03/09/20 0713  TempSrc: Oral  PainSc: 0-No pain         Complications: No complications documented.

## 2020-03-09 NOTE — Plan of Care (Signed)
  Problem: Health Behavior/Discharge Planning: Goal: Ability to manage health-related needs will improve Outcome: Progressing   Problem: Clinical Measurements: Goal: Ability to maintain clinical measurements within normal limits will improve Outcome: Progressing Goal: Will remain free from infection Outcome: Progressing   

## 2020-03-09 NOTE — Progress Notes (Signed)
Inpatient Diabetes Program Recommendations  AACE/ADA: New Consensus Statement on Inpatient Glycemic Control (2015)  Target Ranges:  Prepandial:   less than 140 mg/dL      Peak postprandial:   less than 180 mg/dL (1-2 hours)      Critically ill patients:  140 - 180 mg/dL   Lab Results  Component Value Date   GLUCAP 198 (H) 03/09/2020   HGBA1C 7.2 (H) 03/05/2020    Review of Glycemic Control Results for Nancy Hawkins, Nancy Hawkins (MRN [id number]) as of 03/09/2020 09:47  Ref. Range 03/08/2020 16:53 03/08/2020 20:29 03/09/2020 06:45 03/09/2020 07:16  Glucose-Capillary Latest Ref Range: 70 - 99 mg/dL 223 (H) 208 (H) 205 (H) 198 (H)   Diabetes history: Type 2 DM Outpatient Diabetes medications: Basaglar 12 units QHS, Novolog 3 units TID Current orders for Inpatient glycemic control: Novolog 0-9 units TID, Novolog 0-5 units QHS Decadron 5 mg x 1  Inpatient Diabetes Program Recommendations:    Consider adding Levemir 8 units QD.  May also want to change diet to carb modified, if appropriate.   Thanks, Bronson Curb, MSN, RNC-OB Diabetes Coordinator [phone] (8a-5p)

## 2020-03-09 NOTE — Progress Notes (Signed)
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 She was scheduled for portacath placement to begin neoadjuvant chemotherapy a few weeks ago, however port placement was delayed due to COVID infection. Patient has recovered from [facility], but is now admitted with nausea/vomiting and jaundice. Her new CT scan shows evidence of disease progression with increase in size of the pancreatic mass and worsening narrowing of the portal vein. She underwent ERCP with covered metal stent placement today.  Patient was previously scheduled for outpatient port placement here at [facility] on Wednesday am. If she remains inpatient at that time I will proceed with inpatient port placement as long as the patient remains clinically stable. Please keep NPO after midnight tomorrow night.  Michaelle Birks, MD [facility] General, Hepatobiliary and Pancreatic Surgery 03/09/20 3:42 PM

## 2020-03-09 NOTE — Anesthesia Procedure Notes (Signed)
Procedure Name: Intubation Date/Time: 03/09/2020 7:49 AM Performed by: Rande Brunt, CRNA Pre-anesthesia Checklist: Patient identified, Emergency Drugs available, Suction available and Patient being monitored Patient Re-evaluated:Patient Re-evaluated prior to induction Oxygen Delivery Method: Circle System Utilized Preoxygenation: Pre-oxygenation with 100% oxygen Induction Type: IV induction Ventilation: Mask ventilation without difficulty Laryngoscope Size: Miller and 2 Grade View: Grade I Tube type: Oral Tube size: 7.0 mm Number of attempts: 1 Airway Equipment and Method: Stylet Placement Confirmation: ETT inserted through vocal cords under direct vision,  positive ETCO2 and breath sounds checked- equal and bilateral Secured at: 22 cm Tube secured with: Tape Dental Injury: Teeth and Oropharynx as per pre-operative assessment

## 2020-03-09 NOTE — Progress Notes (Signed)
PROGRESS NOTE  Nancy Hawkins  TKZ:[id number] DOB: [birth year] DOA: 03/05/2020 PCP: Lilian Coma., MD   Brief Narrative: Nancy Hawkins is a 80 y.o. 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 ERCP was performed 1/29 where the ampulla was accessed though guidewire could not be advanced. IV hydration was given with symptomatic improvement and ERCP was reattempted 1/31 with successful biliary stent placement and dilatation.   Assessment & Plan: Principal Problem:   Nausea & vomiting Active Problems:   Primary cancer of head of pancreas (HCC)   Abdominal pain   Diabetes mellitus type 2, uncontrolled (HCC)   Protein malnutrition (HCC)   Transaminitis   Hyperbilirubinemia  Locally advanced pancreatic cancer: No evidence of metastatic disease on recent imaging which was personally reviewed (CT 1/27).  - D/w Dr. Benay Spice who plans neoadjuvant FOLFIRINOX to begin the week of 03/23/2020 as long as LFTs improving. - D/w Dr. Michaelle Birks who will perform Port-A-Cath placement Wednesday morning. Will make NPO p MN tmrw.  - Ultimate plan may include [place]. - IV antiemetics, IV analgesics ordered. Will hold VTE ppx x24 hrs per GI.  Nausea, vomiting, diarrhea: Most likely attributable to pancreatic CA enlargement with biliary obstruction, though of note hepatitis A IgM Ab is positive.  - If symptoms not improved with stent placement, could consider Hepatitis A as cause of symptoms. No directed therapy indicated at this time.   Obstructive jaundice due to enlarging pancreatic mass:  - s/p covered metal bile duct stent placement at ERCP 1/31. Trend LFTs in AM.   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 HbA1c 7.2%.  - Covering with SSI. Restart basal  insulin due to higher CBGs and use of 12u basaglar at home, add mealtime insulin at [facility], continue sensitive scale for now  Pancreatitis: Not acute. 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  - May be at higher risk of post-ERCP pancreatitis, will monitor closely.   Covid-19 infection: Diagnosed 02/10/2020 with mild-moderate symptoms since resolved. Isolation/symptoms delayed port placement. CT abd/pelvix from 1/27 personally reviewed with respect to RLL patchy opacities. This could very well be resolving covid. There are no symptoms of pneumonia, no hypoxia, leukocytosis, etc. at this time to suggest need to pursue this further at this time. - No further Tx, isolation, or work up necessary at this time.  History of PAF, HTN: No recurrence noted. No anticoagulation chronically. - Continue diltiazem, ARB   DVT prophylaxis: SCDs Code Status: Full Family Communication: Husband at bedside Disposition Plan:  Status is: Inpatient  Remains inpatient appropriate because:Ongoing active pain requiring inpatient pain management, Ongoing diagnostic testing needed not appropriate for outpatient work up and IV treatments appropriate due to intensity of illness or inability to take PO   Dispo: The patient is from: Home              Anticipated d/c is to: Home              Anticipated d/c date is: 2 days              Patient currently is not medically stable to d/c.   Consultants:   GI  Procedures:   ERCP 03/09/2020 Dr. Rush Landmark:  Impression:       -  Prior minor papilla sphincterotomy noted.                           - The major papilla appeared normal.                           - A biliary tract obstruction secondary to the                            known pancreatic head mass was found in the lower third of the main duct.                           - The upper third of the main bile duct and left                            and right hepatic ducts and all intrahepatic                             branches were moderately dilated, secondary to the                            stricture.                           - A biliary sphincterotomy was performed.                           - One covered metal biliary stent was placed into                            the common bile duct. This was dilated to improve                            the waist of the stent after placement with good                            effect. Recommendation: - The patient will be observed post-procedure,                            until all discharge criteria are met.                           - Return patient to hospital ward for ongoing care.                           - Advance diet as tolerated.                           - Check liver enzymes (AST, ALT, alkaline                            phosphatase, bilirubin) in the morning.                           -  Observe patient's clinical course.                           - Would hold chemical VTE prophylaxis for 24 hours                            to decrease risk of post-interventional bleeding.                            If anticoagulation is necessary consider heparin                            drip without bolus in 6-12 hours and monitor                            closely.                           - Watch for pancreatitis, bleeding, perforation,                            and cholangitis.                           - Repeat LFTs at [place] or with Oncology clinic                            in 2-weeks upon discharge.                           - The findings and recommendations were discussed with the patient.                           - The findings and recommendations were discussed with the patient's family.   ERCP 03/07/2020 Dr. Benson Norway:  Impression:       - The major papilla appeared normal.                           - The minor papilla appeared normal.                           - A pancreatic sphincterotomy was performed. Recommendation: - Return patient  to hospital ward for ongoing care.                           - Resume regular diet.                           - Reattempt ERCP on Monday with Dr. Rush Landmark.  Antimicrobials:  Ciprofloxacin peri-procedurally x2  Subjective: Still tired after anesthesia, taking sips of water only. No vomiting. Abdominal tenderness is minimal, though the right mid-lower back is moderately in pain which is intermittent and has occurred in the setting of prolonged laying down in the past.   Objective: Vitals:   03/09/20 0921 03/09/20 0952 03/09/20 1159 03/09/20 1516  BP: (!) 114/41 (!) 123/56 (!) 121/53 110/65  Pulse: 70 79 71 86  Resp: (!) '24 19 17 17  ' Temp:  (!) 97.4 F (36.3 C) (!) 97.4 F (36.3 C) 97.6 F (36.4 C)  TempSrc:  Oral Axillary Axillary  SpO2: 97% 97% 97% 96%  Weight:      Height:        Intake/Output Summary (Last 24 hours) at 03/09/2020 1700 Last data filed at 03/09/2020 1530 Gross per 24 hour  Intake 991.36 ml  Output 300 ml  Net 691.36 ml   Filed Weights   03/05/20 0842 03/07/20 1157  Weight: 58.5 kg 58.5 kg    Gen: 80 y.o. female in no acute distress, tired-appearing HEENT: Icteric sclerae Pulm: Non-labored breathing room air. Clear to auscultation bilaterally.  CV: Regular rate and rhythm. No murmur, rub, or gallop. No JVD, no pitting pedal edema. GI: Abdomen soft, with palpable mass in epigastrium, non-tender, non-distended, with normoactive bowel sounds. No organomegaly or masses felt. Ext: Warm, no deformities. Pt declines full exam as she is on bedpan. Skin: +Jaundice. No rashes, lesions or ulcers Neuro: Alert and oriented. No focal neurological deficits. Psych: Judgement and insight appear normal. Mood & affect appropriate.   Data Reviewed: I have personally reviewed following labs and imaging studies  CBC: Recent Labs  Lab 03/05/20 0857 03/06/20 0355 03/08/20 0317  WBC 6.2 9.0 7.5  NEUTROABS  --   --  5.2  HGB 11.3* 10.9* 9.9*  HCT 35.3* 33.2* 28.7*   MCV 89.8 89.7 84.7  PLT 263 258 191   Basic Metabolic Panel: Recent Labs  Lab 03/03/20 1801 03/05/20 0857 03/06/20 0355 03/08/20 1857  NA 136 139 138 130*  K 3.8 3.7 3.7 3.1*  CL 103 106 106 100  CO2 20 21* 17* 21*  GLUCOSE 111* 151* 143* 232*  BUN '12 14 12 11  ' CREATININE 0.46* 0.63 0.50 0.60  CALCIUM 8.4* 8.4* 8.1* 7.9*   GFR: Estimated Creatinine Clearance: 48.2 mL/min (by C-G formula based on SCr of 0.6 mg/dL). Liver Function Tests: Recent Labs  Lab 03/03/20 1801 03/05/20 0857 03/08/20 1857  AST 364* 267* 156*  ALT 331* 318* 179*  ALKPHOS 853* 748* 668*  BILITOT 1.9* 4.0* 8.4*  PROT 6.1 6.4* 5.3*  ALBUMIN 3.1* 2.6* 2.0*   Recent Labs  Lab 03/03/20 1801 03/05/20 0857  LIPASE 5* 12  AMYLASE 9*  --    No results for input(s): AMMONIA in the last 168 hours. Coagulation Profile: Recent Labs  Lab 03/05/20 0942  INR 1.2   Cardiac Enzymes: No results for input(s): CKTOTAL, CKMB, CKMBINDEX, TROPONINI in the last 168 hours. BNP (last 3 results) No results for input(s): PROBNP in the last 8760 hours. HbA1C: No results for input(s): HGBA1C in the last 72 hours. CBG: Recent Labs  Lab 03/08/20 2029 03/09/20 0645 03/09/20 0716 03/09/20 1227 03/09/20 1634  GLUCAP 208* 205* 198* 303* 313*   Lipid Profile: No results for input(s): CHOL, HDL, LDLCALC, TRIG, CHOLHDL, LDLDIRECT in the last 72 hours. Thyroid Function Tests: No results for input(s): TSH, T4TOTAL, FREET4, T3FREE, THYROIDAB in the last 72 hours. Anemia Panel: No results for input(s): VITAMINB12, FOLATE, FERRITIN, TIBC, IRON, RETICCTPCT in the last 72 hours. Urine analysis:    Component Value Date/Time   COLORURINE AMBER (A) 03/05/2020 0852   APPEARANCEUR HAZY (A) 03/05/2020 0852   LABSPEC 1.013 03/05/2020 0852   PHURINE 6.0 03/05/2020 [place] 03/05/2020 0852   HGBUR NEGATIVE 03/05/2020 0852   BILIRUBINUR SMALL (A) 03/05/2020 6606  BILIRUBINUR small (A) 11/17/2019 0906    KETONESUR 20 (A) 03/05/2020 0852   PROTEINUR NEGATIVE 03/05/2020 0852   UROBILINOGEN 0.2 11/17/2019 0906   NITRITE NEGATIVE 03/05/2020 0852   LEUKOCYTESUR NEGATIVE 03/05/2020 0852   Recent Results (from the past 240 hour(s))  SARS Coronavirus 2 by RT PCR (hospital order, performed in [facility] hospital lab) Nasopharyngeal Nasopharyngeal Swab     Status: Abnormal   Collection Time: 03/05/20  4:16 PM   Specimen: Nasopharyngeal Swab  Result Value Ref Range Status   SARS Coronavirus 2 POSITIVE (A) NEGATIVE Final    Comment: RESULT CALLED TO, READ BACK BY AND VERIFIED WITH: Z CAGLE RN 1820 03/05/20 A BROWNING (NOTE) SARS-CoV-2 target nucleic acids are DETECTED  SARS-CoV-2 RNA is generally detectable in upper respiratory specimens  during the acute phase of infection.  Positive results are indicative  of the presence of the identified virus, but do not rule out bacterial infection or co-infection with other pathogens not detected by the test.  Clinical correlation with patient history and  other diagnostic information is necessary to determine patient infection status.  The expected result is negative.  Fact Sheet for Patients:   StrictlyIdeas.no   Fact Sheet for Healthcare Providers:   BankingDealers.co.za    This test is not yet approved or cleared by the [place] FDA and  has been authorized for detection and/or diagnosis of SARS-CoV-2 by FDA under an Emergency Use Authorization (EUA).  This EUA will remain in effect (meaning this te st can be used) for the duration of  the COVID-19 declaration under Section 564(b)(1) of the Act, 21 U.S.C. section 360-bbb-3(b)(1), unless the authorization is terminated or revoked sooner.  Performed at [facility], [place] [address] [place], [place] [address]   MRSA PCR Screening     Status: None   Collection Time: 03/09/20  5:20 AM   Specimen: Nasopharyngeal  Result Value Ref Range  Status   MRSA by PCR NEGATIVE NEGATIVE Final    Comment:        The GeneXpert MRSA Assay (FDA approved for NASAL specimens only), is one component of a comprehensive MRSA colonization surveillance program. It is not intended to diagnose MRSA infection nor to guide or monitor treatment for MRSA infections. Performed at [facility], [place] [address], [place], [place] [address]       Radiology Studies: DG ERCP BILIARY & PANCREATIC DUCTS  Result Date: 03/09/2020 CLINICAL DATA:  Pancreatic head mass.  Post ERCP. EXAM: ERCP TECHNIQUE: Multiple spot images obtained with the fluoroscopic device and submitted for interpretation post-procedure. COMPARISON:  CT abdomen pelvis-03/05/2020 FLUOROSCOPY TIME:  5 minutes, 48 seconds FINDINGS: Twelve spot intraoperative fluoroscopic images the right upper abdominal quadrant ERCP are provided for review. Initial image demonstrates an ERCP probe overlying the right upper abdominal quadrant. Cholecystectomy clips overlies expected location of the gallbladder fossa There is selective cannulation and opacification the common bile duct which appears moderately dilated centrally. Subsequent images demonstrate placement of an internal metallic biliary stent overlying expected location of the CBD. Subsequent images demonstrate biliary plasty within the mid aspect of the stent with improved stent opening. There is minimal opacification intrahepatic biliary tree which appears mildly dilated. There is no definitive opacification of the residual cystic duct or the pancreatic duct. IMPRESSION: ERCP with internal biliary stent placement as above. These images were submitted for radiologic interpretation only. Please see the procedural report for the amount of contrast and the fluoroscopy time utilized. Electronically Signed  By: Sandi Mariscal M.D.   On: 03/09/2020 09:14    Scheduled Meds: . diltiazem  120 mg Oral Daily  . feeding supplement  237 mL Oral BID BM  .  indomethacin  100 mg Rectal Once  . insulin aspart  0-5 Units Subcutaneous QHS  . insulin aspart  0-9 Units Subcutaneous TID WC  . irbesartan  150 mg Oral Daily  . melatonin  3 mg Oral QHS  . multivitamin with minerals  1 tablet Oral Daily  . sodium chloride flush  3 mL Intravenous Q12H   Continuous Infusions:   LOS: 4 days   Time spent: 35 minutes.  Patrecia Pour, MD [facility] [email or web address] 03/09/2020, 5:00 PM

## 2020-03-09 NOTE — Progress Notes (Signed)
IP PROGRESS NOTE  Subjective:   [name] is known to me with a diagnosis of locally advanced pancreas cancer.  She was scheduled for Port-A-Cath placement and to begin neoadjuvant FOLFIRINOX chemotherapy.  Plans for Port-A-Cath placement and chemotherapy were placed on hold when she was diagnosed with COVID-19 infection on 02/10/2020.  The infectious symptoms resolved, but she developed abdominal pain and nausea/vomiting last week prompting her to go the emergency room on 03/05/2020.  She was noted to have elevated liver enzymes and bilirubin.  She was taken to an ERCP by Dr. Alton Revere on 03/07/2020.  The ampulla was accessed, but a guidewire could not be advanced.  The procedure was terminated. A CT abdomen/pelvis 03/05/2020 revealed slight interval increase in the pancreas head/uncinate mass with marked narrowing of the adjacent portal vein.  Consolidation was noted in the right lower lobe. She reports feeling better compared to on hospital admission.  The bilirubin remains markedly elevated.  She is scheduled for a repeat ERCP today. Objective: Vital signs in last 24 hours: Blood pressure (!) 121/53, pulse 71, temperature (!) 97.4 F (36.3 C), temperature source Axillary, resp. rate 17, height 5' 3.5" (1.613 m), weight 128 lb 15.5 oz (58.5 kg), SpO2 97 %.  Intake/Output from previous day: 01/30 0701 - 01/31 0700 In: 826.3 [P.O.:720; I.V.:106.3] Out: -   Physical Exam:  HEENT: Scleral icterus, no thrush, the tongue is dry Lungs: Clear anteriorly, no respiratory distress Cardiac: Regular rate and rhythm Abdomen: No hepatosplenomegaly, no mass, nontender Extremities: No leg edema Skin: Jaundice    Lab Results: Recent Labs    03/08/20 0317  WBC 7.5  HGB 9.9*  HCT 28.7*  PLT 200    BMET Recent Labs    03/08/20 1857  NA 130*  K 3.1*  CL 100  CO2 21*  GLUCOSE 232*  BUN 11  CREATININE 0.60  CALCIUM 7.9*    No results found for: CEA1  Studies/Results: DG ERCP BILIARY &  PANCREATIC DUCTS  Result Date: 03/09/2020 CLINICAL DATA:  Pancreatic head mass.  Post ERCP. EXAM: ERCP TECHNIQUE: Multiple spot images obtained with the fluoroscopic device and submitted for interpretation post-procedure. COMPARISON:  CT abdomen pelvis-03/05/2020 FLUOROSCOPY TIME:  5 minutes, 48 seconds FINDINGS: Twelve spot intraoperative fluoroscopic images the right upper abdominal quadrant ERCP are provided for review. Initial image demonstrates an ERCP probe overlying the right upper abdominal quadrant. Cholecystectomy clips overlies expected location of the gallbladder fossa There is selective cannulation and opacification the common bile duct which appears moderately dilated centrally. Subsequent images demonstrate placement of an internal metallic biliary stent overlying expected location of the CBD. Subsequent images demonstrate biliary plasty within the mid aspect of the stent with improved stent opening. There is minimal opacification intrahepatic biliary tree which appears mildly dilated. There is no definitive opacification of the residual cystic duct or the pancreatic duct. IMPRESSION: ERCP with internal biliary stent placement as above. These images were submitted for radiologic interpretation only. Please see the procedural report for the amount of contrast and the fluoroscopy time utilized. Electronically Signed   By: Sandi Mariscal M.D.   On: 03/09/2020 09:14   DG C-Arm 1-60 Min-No Report  Result Date: 03/07/2020 Fluoroscopy was utilized by the requesting physician.  No radiographic interpretation.    Medications: I have reviewed the patient's current medications.  Assessment/Plan: 1. Pancreas cancer, borderline resectable   CT abdomen/pelvis-inflammation involving the mid body of the pancreas with peripancreatic fat stranding; possible 2.5 x 2.2 cm low-attenuation mass within the  mid body of the pancreas, appears to abut the celiac axis as well as the splenic vein.    MRI  11/18/2019-extensive inflammatory fat stranding about the pancreas with a thinly septated multicystic fluid signal lesion of the anterior pancreatic neck measuring 3.2 x 1.7 cm most consistent with a pancreatic pseudocyst; numerous additional small fluid signal lesions throughout the pancreatic parenchyma, the remaining lesions subcentimeter; central portion of pancreatic duct dilated measuring up to 8 mm without obvious obstructing lesion identified; trace ascites; trace bilateral pleural effusions and associated atelectasis or consolidation.    Colonoscopy 11/20/2019-5 polyps (resected and retrieved- multiple tubular adenomas, negative for high-grade dysplasia); 2 colonic angioectasias; diverticulosis in the entire examined colon; nonbleeding nonthrombosed external and internal hemorrhoids.    EGD/EUS 11/20/2019-few white nummular lesions in the entire esophagus (esophagus biopsy with no significant pathologic findings, no fungal elements); hematin found in the entire examined stomach; gastritis (stomach biopsy showed gastropathy/gastritis); 14 mm semisessile polyp found in the posterior wall of the gastric antrum (hyperplastic polyp); a lesion was identified in the pancreatic body staged T2 N0 MX.  FNA of the pancreas lesion showed benign reactive/reparative changes, prominent inflammation.    EUS procedure 01/20/2020-masslike region identified in the pancreatic head, stage T2 N0 MX.  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  CTs chest/pelvis 01/29/2020-no evidence of metastatic disease  CT abdomen/pelvis 03/05/2020-increase in size of pancreas head/uncinate mass, marked narrowing of the adjacent portal vein, patchy consolidation in the right lower lung, mild intrahepatic and extrahepatic biliary ductal dilatation 2. Pancreatitis October 2021 3. Diabetes October 2021 4. Isolated episode of atrial fibrillation approximately 15 years  ago 5. Hypertension 6. COVID-19 infection 7. Admission 03/05/2020 with biliary obstruction  [name] has been diagnosed with borderline resectable pancreas cancer.  She was scheduled to undergo Port-A-Cath placement and begin neoadjuvant FOLFIRINOX chemotherapy several weeks ago.  However she was diagnosed with COVID-19 infection and treatment was delayed.  She is now admitted with biliary obstruction.  A CT on 03/05/2020 reveals evidence of locally advanced disease and no evidence of metastatic disease.  She is scheduled to undergo an ERCP with placement of a bile duct stent today.  I discussed the treatment plan with Ms. Edick.  She will be scheduled to begin FOLFIRINOX chemotherapy once the bilirubin and liver enzymes have improved.  Dr. [name] is aware of the hospital admission and plans to proceed with Port-A-Cath placement.  I will arrange for outpatient follow-up with the plan to begin FOLFIRINOX during the week of 03/23/2020.    LOS: 4 days   [name], MD   03/09/2020, 1:58 PM

## 2020-03-09 NOTE — Progress Notes (Signed)
01/31 0700 Pt transported to OR. Pt denies pain. Pt stable. RN given report to Tory Emerald.

## 2020-03-09 NOTE — Plan of Care (Signed)

## 2020-03-10 ENCOUNTER — Encounter (HOSPITAL_COMMUNITY): Payer: Self-pay | Admitting: Gastroenterology

## 2020-03-10 DIAGNOSIS — K838 Other specified diseases of biliary tract: Secondary | ICD-10-CM

## 2020-03-10 DIAGNOSIS — Z9889 Other specified postprocedural states: Secondary | ICD-10-CM

## 2020-03-10 LAB — COMPREHENSIVE METABOLIC PANEL
ALT: 121 U/L — ABNORMAL HIGH (ref 0–44)
AST: 52 U/L — ABNORMAL HIGH (ref 15–41)
Albumin: 1.9 g/dL — ABNORMAL LOW (ref 3.5–5.0)
Alkaline Phosphatase: 617 U/L — ABNORMAL HIGH (ref 38–126)
Anion gap: 7 (ref 5–15)
BUN: 14 mg/dL (ref 8–23)
CO2: 23 mmol/L (ref 22–32)
Calcium: 8.2 mg/dL — ABNORMAL LOW (ref 8.9–10.3)
Chloride: 99 mmol/L (ref 98–111)
Creatinine, Ser: 0.48 mg/dL (ref 0.44–1.00)
GFR, Estimated: >60 mL/min (ref >60)
Glucose, Bld: 202 mg/dL — ABNORMAL HIGH (ref 70–99)
Potassium: 2.7 mmol/L — CL (ref 3.5–5.1)
Sodium: 129 mmol/L — ABNORMAL LOW (ref 135–145)
Total Bilirubin: 3.7 mg/dL — ABNORMAL HIGH (ref 0.3–1.2)
Total Protein: 4.9 g/dL — ABNORMAL LOW (ref 6.5–8.1)

## 2020-03-10 LAB — GLUCOSE, CAPILLARY
Glucose-Capillary: 156 mg/dL — ABNORMAL HIGH (ref 70–99)
Glucose-Capillary: 285 mg/dL — ABNORMAL HIGH (ref 70–99)
Glucose-Capillary: 247 mg/dL — ABNORMAL HIGH (ref 70–99)
Glucose-Capillary: 160 mg/dL — ABNORMAL HIGH (ref 70–99)

## 2020-03-10 LAB — CBC
HCT: 27.6 % — ABNORMAL LOW (ref 36.0–46.0)
Hemoglobin: 9.9 g/dL — ABNORMAL LOW (ref 12.0–15.0)
MCH: 29.5 pg (ref 26.0–34.0)
MCHC: 35.9 g/dL (ref 30.0–36.0)
MCV: 82.1 fL (ref 80.0–100.0)
Platelets: 238 10*3/uL (ref 150–400)
RBC: 3.36 MIL/uL — ABNORMAL LOW (ref 3.87–5.11)
RDW: 16 % — ABNORMAL HIGH (ref 11.5–15.5)
WBC: 8.4 10*3/uL (ref 4.0–10.5)
nRBC: 0 % (ref 0.0–0.2)

## 2020-03-10 MED ORDER — DOCUSATE SODIUM 100 MG PO CAPS
100.0000 mg | ORAL_CAPSULE | Freq: Two times a day (BID) | ORAL | Status: DC | PRN
  Administered 2020-03-10: 100 mg via ORAL
  Filled 2020-03-10: qty 1

## 2020-03-10 MED ORDER — PANTOPRAZOLE SODIUM 40 MG PO TBEC
40.0000 mg | DELAYED_RELEASE_TABLET | Freq: Every day | ORAL | Status: DC
  Administered 2020-03-10 – 2020-03-12 (×3): 40 mg via ORAL
  Filled 2020-03-10 (×3): qty 1

## 2020-03-10 MED ORDER — POTASSIUM CHLORIDE IN NACL 40-0.9 MEQ/L-% IV SOLN
INTRAVENOUS | Status: DC
  Filled 2020-03-10 (×6): qty 1000

## 2020-03-10 MED ORDER — VANCOMYCIN HCL IN DEXTROSE 1-5 GM/200ML-% IV SOLN
1000.0000 mg | INTRAVENOUS | Status: AC
  Administered 2020-03-11: 1000 mg via INTRAVENOUS
  Filled 2020-03-10 (×2): qty 200

## 2020-03-10 MED ORDER — MAGNESIUM SULFATE 2 GM/50ML IV SOLN
2.0000 g | Freq: Once | INTRAVENOUS | Status: AC
  Administered 2020-03-10: 2 g via INTRAVENOUS
  Filled 2020-03-10: qty 50

## 2020-03-10 MED ORDER — BISACODYL 5 MG PO TBEC
5.0000 mg | DELAYED_RELEASE_TABLET | Freq: Once | ORAL | Status: AC
  Administered 2020-03-10: 5 mg via ORAL
  Filled 2020-03-10: qty 1

## 2020-03-10 MED ORDER — ENSURE MAX PROTEIN PO LIQD: 11.0000 [oz_av] | Freq: Every day | ORAL | Status: DC

## 2020-03-10 MED ORDER — GLUCERNA SHAKE PO LIQD: 237.0000 mL | Freq: Two times a day (BID) | ORAL | Status: DC

## 2020-03-10 MED ORDER — POTASSIUM CHLORIDE CRYS ER 20 MEQ PO TBCR
20.0000 meq | EXTENDED_RELEASE_TABLET | Freq: Once | ORAL | Status: AC
  Administered 2020-03-10: 20 meq via ORAL
  Filled 2020-03-10: qty 1

## 2020-03-10 NOTE — Anesthesia Preprocedure Evaluation (Addendum)
Anesthesia Evaluation  Patient identified by MRN, date of birth, ID band Patient awake    Reviewed: Allergy & Precautions, NPO status , Patient's Chart, lab work & pertinent test results  Airway Mallampati: II  TM Distance: >3 FB Neck ROM: Full    Dental no notable dental hx.    Pulmonary neg pulmonary ROS,    Pulmonary exam normal breath sounds clear to auscultation       Cardiovascular METS: 3 - Mets hypertension, Normal cardiovascular exam Rhythm:Regular Rate:Normal     Neuro/Psych negative neurological ROS  negative psych ROS   GI/Hepatic Neg liver ROS, GERD  ,occasional nausea, none currently   Endo/Other  diabetes, Type 2, Insulin Dependent  Renal/GU negative Renal ROS  negative genitourinary   Musculoskeletal negative musculoskeletal ROS (+)   Abdominal   Peds negative pediatric ROS (+)  Hematology  (+) anemia ,   Anesthesia Other Findings Pancreatic cancer  Reproductive/Obstetrics negative OB ROS                            Anesthesia Physical Anesthesia Plan  ASA: III  Anesthesia Plan: General   Post-op Pain Management:    Induction: Intravenous  PONV Risk Score and Plan: Ondansetron  Airway Management Planned: LMA  Additional Equipment:   Intra-op Plan:   Post-operative Plan: Extubation in OR  Informed Consent: I have reviewed the patients History and Physical, chart, labs and discussed the procedure including the risks, benefits and alternatives for the proposed anesthesia with the patient or authorized representative who has indicated his/her understanding and acceptance.     Dental advisory given  Plan Discussed with: CRNA, Anesthesiologist and Surgeon  Anesthesia Plan Comments:        Anesthesia Quick Evaluation

## 2020-03-10 NOTE — Progress Notes (Addendum)
Nutrition Follow-up  DOCUMENTATION CODES:   Not applicable  INTERVENTION:   -D/c Ensure Enlive po BID, each supplement provides 350 kcal and 20 grams of protein -Glucerna Shake po BID, each supplement provides 220 kcal and 10 grams of protein -Ensure Max po daily, each supplement provides 150 kcal and 30 grams of protein -Continue Magic cup TID with meals, each supplement provides 290 kcal and 9 grams of protein -MVI with minerals daily   NUTRITION DIAGNOSIS:   Increased nutrient needs related to cancer and cancer related treatments as evidenced by estimated needs.  Ongoing  GOAL:   Patient will meet greater than or equal to 90% of their needs  Progressing   MONITOR:   PO intake,Supplement acceptance,Diet advancement,Labs,Weight trends,Skin,I & O's  REASON FOR ASSESSMENT:   Malnutrition Screening Tool    ASSESSMENT:   Nancy Hawkins is a 80 y.o. female with medical history significant of hypertension, hyperlipidemia, A. Fib, diabetes mellitus type 2, pancreatic cancer diagnosed in October 2021 and recent COVID-19 infection on 02/10/2019 presents with complaints of abdominal pain with nausea and vomiting over the last week.  She complains of having lower abdominal pain that she describes as crampy in nature with to 2-3 loose stools per day that are gray-colored.  Patient reports being unable to keep any significant amount of food or liquids down over the last 2 to 3 days which gave her concern because she is a diabetic.  She was seen in urgent care and noted to have elevated liver enzymes 2 days ago's.  Due to having Covid-19 it delayed her starting chemotherapy treatment.  Plan is for her to have the Port-A-Cath placed next week to initiate chemotherapy.  1/31- s/p ERCP  Reviewed I/O's: +585 ml x 24 hours and +2.4 L since admission  UOP: 300 ml x 24 hours  Per MD notes, plan for port-a-cath placement tomorrow (03/11/20).   Spoke with pt at bedside, who was pleasant and in good  spirits today. She complains of being "out of my routine" secondary to hospitalization. Pt reports she has had to be NPO for multiple procedures and has had a bout of acid reflux, which has affected her oral intake. She has been trying to consume her food in the hospital, but often finds it difficult to do so due to reflux and early satiety. Noted meal completions 30-70%. Pt sipping on a Premier Protein shake brought in by pt daughter- she complains that the Ensure currently prescribed has too much sugar in it.   Pt reports very minimal intake over the past week due to difficulty keeping foods and liquids down. Pt shares that prior to acute illness, she was consuming 5-6 small meals per day (foods such as omelette and muffin, meat/starch/vegetable/ and sandwiches). Both her and family members had been preparing foods and she feels like she was eating a wide variety of foods in addition to 1-2 Premier Protein shakes daily.   Per pt, her UBW is around 160#. She endorses a 20# wt loss over the past week secondary to minimal intake. Pt has experienced a 17% wt loss over the past 3 months, which is significant for time frame.   Pt reports she is very active, but has noticed muscle atrophy. Discussed importance of good meal and supplement intake to promote healing. She is amenable to DM friendly supplements (Glucerma and Ensure Max).   Labs reviewed: Na: 129, K: 2.7, CBGS: 277-412 (inpatient orders for glycemic control are 0-5 units insulin aspart daily at  bedtime, 0-9 units insulin aspart TID with meals, 3 units insulin aspart TID with meals, and 12 units insulin detemir BID).   NUTRITION - FOCUSED PHYSICAL EXAM:  Flowsheet Row Most Recent Value  Orbital Region No depletion  Upper Arm Region Mild depletion  Thoracic and Lumbar Region No depletion  Buccal Region No depletion  Temple Region No depletion  Clavicle Bone Region Mild depletion  Clavicle and Acromion Bone Region Mild depletion  Scapular Bone  Region Mild depletion  Dorsal Hand Mild depletion  Patellar Region Mild depletion  Anterior Thigh Region Mild depletion  Posterior Calf Region Mild depletion  Edema (RD Assessment) None  Hair Reviewed  Eyes Reviewed  Mouth Reviewed  Skin Reviewed  Nails Reviewed       Diet Order:   Diet Order            Diet NPO time specified Except for: Sips with Meds  Diet effective midnight           Diet Heart Room service appropriate? Yes; Fluid consistency: Thin  Diet effective now                 EDUCATION NEEDS:   No education needs have been identified at this time  Skin:  Skin Assessment: Reviewed RN Assessment  Last BM:  03/05/20  Height:   Ht Readings from Last 1 Encounters:  03/05/20 5' 3.5" (1.613 m)    Weight:   Wt Readings from Last 1 Encounters:  03/07/20 58.5 kg    Ideal Body Weight:  53.4 kg  BMI:  Body mass index is 22.49 kg/m.  Estimated Nutritional Needs:   Kcal:  2050-2250  Protein:  105-120 grams  Fluid:  > 2 L    Loistine Chance, RD, LDN, [place] Registered Dietitian II Certified Diabetes Care and Education Specialist Please refer to [facility] for RD and/or RD on-call/weekend/after hours pager

## 2020-03-10 NOTE — Progress Notes (Signed)
Daily Rounding Note  03/10/2020, 12:43 PM  LOS: 5 days   SUBJECTIVE:   Chief complaint: Obstructive jaundice, malignant CBD stricture, pancreatic cancer.     In general she feels better but she still has some abdominal bloating and has not had a bowel movement for few days.  Received prn colace today.   No nausea.  Appetite still depressed.  She notices that she is not as jaundiced.   OBJECTIVE:         Vital signs in last 24 hours:    Temp:  [97.6 F (36.4 C)-98.5 F (36.9 C)] 97.9 F (36.6 C) (02/01 0841) Pulse Rate:  [75-86] 76 (02/01 0841) Resp:  [16-18] 18 (02/01 0841) BP: (110-135)/(54-67) 135/57 (02/01 0841) SpO2:  [96 %-98 %] 98 % (02/01 0841) Last BM Date: 03/08/20 Filed Weights   03/05/20 0842 03/07/20 1157  Weight: 58.5 kg 58.5 kg   General: Looks well.  No obvious jaundice.  Alert, comfortable, pleasant. Heart: RRR Chest: Clear bilaterally without labored breathing or cough Abdomen: Soft, not tender or distended.  No HSM, masses, bruits, hernias. Extremities: No CCE Neuro/Psych: Oriented x3.  Fluid speech.  No gross deficits.  Intake/Output from previous day: 01/31 0701 - 02/01 0700 In: 885.1 [P.O.:200; I.V.:485.1; IV Piggyback:200] Out: 300 [Urine:300]  Intake/Output this shift: Total I/O In: 812.6 [P.O.:480; I.V.:282.6; IV Piggyback:50] Out: -   Lab Results: Recent Labs    03/08/20 0317 03/10/20 0342  WBC 7.5 8.4  HGB 9.9* 9.9*  HCT 28.7* 27.6*  PLT 200 238   BMET Recent Labs    03/08/20 1857 03/10/20 0342  NA 130* 129*  K 3.1* 2.7*  CL 100 99  CO2 21* 23  GLUCOSE 232* 202*  BUN 11 14  CREATININE 0.60 0.48  CALCIUM 7.9* 8.2*   LFT Recent Labs    03/08/20 1857 03/10/20 0342  PROT 5.3* 4.9*  ALBUMIN 2.0* 1.9*  AST 156* 52*  ALT 179* 121*  ALKPHOS 668* 617*  BILITOT 8.4* 3.7*   PT/INR No results for input(s): LABPROT, INR in the last 72 hours. Hepatitis Panel No  results for input(s): HEPBSAG, HCVAB, HEPAIGM, HEPBIGM in the last 72 hours.  Studies/Results: DG ERCP BILIARY & PANCREATIC DUCTS  Result Date: 03/09/2020 CLINICAL DATA:  Pancreatic head mass.  Post ERCP. EXAM: ERCP TECHNIQUE: Multiple spot images obtained with the fluoroscopic device and submitted for interpretation post-procedure. COMPARISON:  CT abdomen pelvis-03/05/2020 FLUOROSCOPY TIME:  5 minutes, 48 seconds FINDINGS: Twelve spot intraoperative fluoroscopic images the right upper abdominal quadrant ERCP are provided for review. Initial image demonstrates an ERCP probe overlying the right upper abdominal quadrant. Cholecystectomy clips overlies expected location of the gallbladder fossa There is selective cannulation and opacification the common bile duct which appears moderately dilated centrally. Subsequent images demonstrate placement of an internal metallic biliary stent overlying expected location of the CBD. Subsequent images demonstrate biliary plasty within the mid aspect of the stent with improved stent opening. There is minimal opacification intrahepatic biliary tree which appears mildly dilated. There is no definitive opacification of the residual cystic duct or the pancreatic duct. IMPRESSION: ERCP with internal biliary stent placement as above. These images were submitted for radiologic interpretation only. Please see the procedural report for the amount of contrast and the fluoroscopy time utilized. Electronically Signed   By: Sandi Mariscal M.D.   On: 03/09/2020 09:14    ASSESMENT:   *    Malignant bile duct stricture, obstructive  jaundice, lower abdominal pain in pt with pancreatic cancer. 03/07/2020 ERCP: Normal major and minor papilla.  Sphincterotomy performed (on retrospect this was felt to be the minor not major papilla).  Unable to cannulate CBD.  Pus and bile flowing from CBD.  Procedure terminated. 03/09/2000 ERCP: Post sphincterotomy appearance of minor papilla.   Obstruction/stricture at lower CBD.  Dilation of CBD, L/R hepatic ducts, intrahepatic branches secondary to stricture.  Biliary sphincterotomy performed to major papilla.  Covered metal stent placed to CBD. Over the last 8 days: T bili 8.4 >> 8.7.  Alk phos 364 >> 32.  Transaminases 364/331 >> 52/121 No evidence of post ERCP (Endoscopicretrogradecholangiopancreatography) pancreatitis.  *   Pancreatic cancer, borderline resectable.  Placement of Port-A-Cath and initiation of chemotherapy delayed due to positive COVID-19 test on 1/3 and subsequent interval development of obstructive jaundice.    PLAN   *   Repeat LFTs in about 2 weeks.  Can be done at the oncology clinic or [facility] lab.  *   Port-A-Cath placement 2/2 per Dr. Michaelle Birks.  *    Give a dose of Dulcolax now.   Nancy Hawkins  03/10/2020, 12:43 PM Phone [phone]

## 2020-03-10 NOTE — Progress Notes (Addendum)
PROGRESS NOTE  Nancy Hawkins  YBF:[id number] DOB: March 16, 1940 DOA: 03/05/2020 PCP: Lilian Coma., MD   Brief Narrative: Nancy Hawkins is a 80 y.o. 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 ERCP was performed 1/29 where the ampulla was accessed though guidewire could not be advanced. IV hydration was given with symptomatic improvement and ERCP was reattempted 1/31 with successful biliary stent placement and dilatation.   Assessment & Plan: Principal Problem:   Nausea & vomiting Active Problems:   Primary cancer of head of pancreas (HCC)   Abdominal pain   Diabetes mellitus type 2, uncontrolled (HCC)   Protein malnutrition (HCC)   Transaminitis   Hyperbilirubinemia  Locally advanced pancreatic cancer: No evidence of metastatic disease on recent imaging which was personally reviewed (CT 1/27).  - D/w Dr. Benay Spice who plans neoadjuvant FOLFIRINOX to begin the week of 03/23/2020 as long as LFTs improving. - D/w Dr. Michaelle Birks who will perform Port-A-Cath placement tmrw. NPO p MN.  - Ultimate plan may include resection. - IV antiemetics, IV analgesics ordered. Will hold VTE ppx per GI and pending port placement.  Nausea, vomiting, diarrhea: Most likely attributable to pancreatic CA enlargement with biliary obstruction, though of note hepatitis A IgM Ab is positive.  - If symptoms not improved with stent placement, could consider Hepatitis A as cause of symptoms. No directed therapy indicated at this time.  - Add colace  Hypokalemia: Severe.  - Supplement po and in IVF today, recheck in AM. Give empiric Mg and check this in AM.   GERD:  - Add PPI  Obstructive jaundice due to enlarging pancreatic mass:  - s/p covered metal bile duct stent placement at ERCP 1/31. 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 HbA1c 7.2%.  - Covering with SSI. Restarted basal insulin 12u, mealtime insulin at [facility], continue sensitive scale for now. Anticipate brittle CBGs and about to be NPO with unpredictable po intake. Will allow some permissive hyperglycemia for now.  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  - No evidence of post-ERCP pancreatitis, will continue to monitor closely.   History of covid-19 infection: Diagnosed 02/10/2020 with mild-moderate symptoms since resolved. Isolation/symptoms delayed port placement. CT abd/pelvix from 1/27 personally reviewed with respect to RLL patchy opacities. This could very well be resolving covid. There are no symptoms of pneumonia, no hypoxia, leukocytosis, etc. at this time to suggest need to pursue this further at this time. - No further Tx, isolation, or work up necessary at this time.  History of PAF, HTN: No recurrence noted. No anticoagulation chronically. - Continue diltiazem, ARB   DVT prophylaxis: SCDs Code Status: Full Family Communication: None at bedside Disposition Plan:  Status is: Inpatient  Remains inpatient appropriate because:Ongoing active pain requiring inpatient pain management, Ongoing diagnostic testing needed not appropriate for outpatient work up and IV treatments appropriate due to intensity of illness or inability to take PO   Dispo: The patient is from: Home              Anticipated d/c is to: Home              Anticipated d/c date is: 2 days - anticipate ability to  return home once tolerating adequate po intake. Has thus far failed to do that, starting IV fluids and managing severe hypokalemia today. With her intolerance to anesthesia, it is unlikely that she will fly well enough after port placement tomorrow either. Hopeful for 2/3.              Patient currently is not medically stable to d/c.   Consultants:    GI  Procedures:   ERCP 03/09/2020 Dr. Rush Landmark:  Impression:       - Prior minor papilla sphincterotomy noted.                           - The major papilla appeared normal.                           - A biliary tract obstruction secondary to the                            known pancreatic head mass was found in the lower third of the main duct.                           - The upper third of the main bile duct and left                            and right hepatic ducts and all intrahepatic                            branches were moderately dilated, secondary to the                            stricture.                           - A biliary sphincterotomy was performed.                           - One covered metal biliary stent was placed into                            the common bile duct. This was dilated to improve                            the waist of the stent after placement with good                            effect. Recommendation: - The patient will be observed post-procedure,                            until all discharge criteria are met.                           - Return patient to hospital ward for ongoing care.                           -  Advance diet as tolerated.                           - Check liver enzymes (AST, ALT, alkaline                            phosphatase, bilirubin) in the morning.                           - Observe patient's clinical course.                           - Would hold chemical VTE prophylaxis for 24 hours                            to decrease risk of post-interventional bleeding.                            If anticoagulation is necessary consider heparin                            drip without bolus in 6-12 hours and monitor                            closely.                           - Watch for pancreatitis, bleeding, perforation,                            and cholangitis.                           - Repeat LFTs at [facility]  or with Oncology clinic                            in 2-weeks upon discharge.                           - The findings and recommendations were discussed with the patient.                           - The findings and recommendations were discussed with the patient's family.   ERCP 03/07/2020 Dr. Benson Norway:  Impression:       - The major papilla appeared normal.                           - The minor papilla appeared normal.                           - A pancreatic sphincterotomy was performed. Recommendation: - Return patient to hospital ward for ongoing care.                           - Resume regular diet.                           -  Reattempt ERCP on Monday with Dr. Rush Landmark.  Antimicrobials:  Ciprofloxacin peri-procedurally x2  Subjective: Abdominal cramping is stable, intermittent, mostly in epigastrium, no worse than yesterday. Right back pain is resolved now that she's been up/in chair. Nausea controlled. No fevers. Has not really eaten all that much over past 24 hours. Feels less yellow/orange. Wished her a happy birthday.  Objective: Vitals:   03/10/20 0200 03/10/20 0809 03/10/20 0841 03/10/20 1356  BP: (!) 113/54 134/67 (!) 135/57 (!) 146/62  Pulse: 77 77 76 71  Resp: _0 Temp: 98.5 F (36.9 C) 98.2 F (36.8 C) 97.9 F (36.6 C) 98.3 F (36.8 C)  TempSrc: Oral Oral Oral Oral  SpO2: 96% 97% 98% 98%  Weight:      Height:        Intake/Output Summary (Last 24 hours) at 03/10/2020 1506 Last data filed at 03/10/2020 1357 Gross per 24 hour  Intake 1252.6 ml  Output --  Net 1252.6 ml   Filed Weights   03/05/20 0842 03/07/20 1157  Weight: 58.5 kg 58.5 kg   Gen: Pleasant, elderly female in no distress Pulm: Nonlabored breathing room air. Clear. CV: Regular rate and rhythm. No murmur, rub, or gallop. No JVD, no dependent edema. GI: Abdomen soft, epigastric tenderness to palpation without rebound or guarding, can feel density there, non-distended, with normoactive  bowel sounds.  Ext: Warm, no deformities Skin: No rashes, lesions or ulcers on visualized skin. Neuro: Alert and oriented. No focal neurological deficits. Psych: Judgement and insight appear fair. Mood euthymic & affect congruent. Behavior is appropriate.    Data Reviewed: I have personally reviewed following labs and imaging studies  CBC: Recent Labs  Lab 03/05/20 0857 03/06/20 0355 03/08/20 0317 03/10/20 0342  WBC 6.2 9.0 7.5 8.4  NEUTROABS  --   --  5.2  --   HGB 11.3* 10.9* 9.9* 9.9*  HCT 35.3* 33.2* 28.7* 27.6*  MCV 89.8 89.7 84.7 82.1  PLT 263 258 200 034   Basic Metabolic Panel: Recent Labs  Lab 03/03/20 1801 03/05/20 0857 03/06/20 0355 03/08/20 1857 03/10/20 0342  NA 136 139 138 130* 129*  K 3.8 3.7 3.7 3.1* 2.7*  CL 103 106 106 100 99  CO2 20 21* 17* 21* 23  GLUCOSE 111* 151* 143* 232* 202*  BUN _1 CREATININE 0.46* 0.63 0.50 0.60 0.48  CALCIUM 8.4* 8.4* 8.1* 7.9* 8.2*   GFR: Estimated Creatinine Clearance: 47.5 mL/min (by C-G formula based on SCr of 0.48 mg/dL). Liver Function Tests: Recent Labs  Lab 03/03/20 1801 03/05/20 0857 03/08/20 1857 03/10/20 0342  AST 364* 267* 156* 52*  ALT 331* 318* 179* 121*  ALKPHOS 853* 748* 668* 617*  BILITOT 1.9* 4.0* 8.4* 3.7*  PROT 6.1 6.4* 5.3* 4.9*  ALBUMIN 3.1* 2.6* 2.0* 1.9*   Recent Labs  Lab 03/03/20 1801 03/05/20 0857  LIPASE 5* 12  AMYLASE 9*  --    No results for input(s): AMMONIA in the last 168 hours. Coagulation Profile: Recent Labs  Lab 03/05/20 0942  INR 1.2   Cardiac Enzymes: No results for input(s): CKTOTAL, CKMB, CKMBINDEX, TROPONINI in the last 168 hours. BNP (last 3 results) No results for input(s): PROBNP in the last 8760 hours. HbA1C: No results for input(s): HGBA1C in the last 72 hours. CBG: Recent Labs  Lab 03/09/20 1227 03/09/20 1634 03/09/20 2152 03/10/20 0702 03/10/20 1129  GLUCAP 303* 313* 319* 156* 285*   Lipid Profile: No results for input(s):  CHOL, HDL, LDLCALC, TRIG, CHOLHDL, LDLDIRECT in the last 72 hours. Thyroid Function Tests: No results for input(s): TSH, T4TOTAL, FREET4, T3FREE, THYROIDAB in the last 72 hours. Anemia Panel: No results for input(s): VITAMINB12, FOLATE, FERRITIN, TIBC, IRON, RETICCTPCT in the last 72 hours. Urine analysis:    Component Value Date/Time   COLORURINE AMBER (A) 03/05/2020 0852   APPEARANCEUR HAZY (A) 03/05/2020 0852   LABSPEC 1.013 03/05/2020 0852   PHURINE 6.0 03/05/2020 0852   GLUCOSEU NEGATIVE 03/05/2020 0852   HGBUR NEGATIVE 03/05/2020 0852   BILIRUBINUR SMALL (A) 03/05/2020 0852   BILIRUBINUR small (A) 11/17/2019 0906   KETONESUR 20 (A) 03/05/2020 0852   PROTEINUR NEGATIVE 03/05/2020 0852   UROBILINOGEN 0.2 11/17/2019 0906   NITRITE NEGATIVE 03/05/2020 0852   LEUKOCYTESUR NEGATIVE 03/05/2020 0852   Recent Results (from the past 240 hour(s))  SARS Coronavirus 2 by RT PCR (hospital order, performed in [facility] hospital lab) Nasopharyngeal Nasopharyngeal Swab     Status: Abnormal   Collection Time: 03/05/20  4:16 PM   Specimen: Nasopharyngeal Swab  Result Value Ref Range Status   SARS Coronavirus 2 POSITIVE (A) NEGATIVE Final    Comment: RESULT CALLED TO, READ BACK BY AND VERIFIED WITH: Z CAGLE RN 1820 03/05/20 A BROWNING (NOTE) SARS-CoV-2 target nucleic acids are DETECTED  SARS-CoV-2 RNA is generally detectable in upper respiratory specimens  during the acute phase of infection.  Positive results are indicative  of the presence of the identified virus, but do not rule out bacterial infection or co-infection with other pathogens not detected by the test.  Clinical correlation with patient history and  other diagnostic information is necessary to determine patient infection status.  The expected result is negative.  Fact Sheet for Patients:   StrictlyIdeas.no   Fact Sheet for Healthcare Providers:   BankingDealers.co.za     This test is not yet approved or cleared by the [place] FDA and  has been authorized for detection and/or diagnosis of SARS-CoV-2 by FDA under an Emergency Use Authorization (EUA).  This EUA will remain in effect (meaning this te st can be used) for the duration of  the COVID-19 declaration under Section 564(b)(1) of the Act, 21 U.S.C. section 360-bbb-3(b)(1), unless the authorization is terminated or revoked sooner.  Performed at [facility], [place] [address], [place], [place] [address]   MRSA PCR Screening     Status: None   Collection Time: 03/09/20  5:20 AM   Specimen: Nasopharyngeal  Result Value Ref Range Status   MRSA by PCR NEGATIVE NEGATIVE Final    Comment:        The GeneXpert MRSA Assay (FDA approved for NASAL specimens only), is one component of a comprehensive MRSA colonization surveillance program. It is not intended to diagnose MRSA infection nor to guide or monitor treatment for MRSA infections. Performed at [facility], [place] [address], [place], [place] [address]       Radiology Studies: DG ERCP BILIARY & PANCREATIC DUCTS  Result Date: 03/09/2020 CLINICAL DATA:  Pancreatic head mass.  Post ERCP. EXAM: ERCP TECHNIQUE: Multiple spot images obtained with the fluoroscopic device and submitted for interpretation post-procedure. COMPARISON:  CT abdomen pelvis-03/05/2020 FLUOROSCOPY TIME:  5 minutes, 48 seconds FINDINGS: Twelve spot intraoperative fluoroscopic images the right upper abdominal quadrant ERCP are provided for review. Initial image demonstrates an ERCP probe overlying the right upper abdominal quadrant. Cholecystectomy clips overlies expected location of the gallbladder fossa There is selective cannulation and opacification the  common bile duct which appears moderately dilated centrally. Subsequent images demonstrate placement of an internal metallic biliary stent overlying expected location of the CBD. Subsequent images demonstrate  biliary plasty within the mid aspect of the stent with improved stent opening. There is minimal opacification intrahepatic biliary tree which appears mildly dilated. There is no definitive opacification of the residual cystic duct or the pancreatic duct. IMPRESSION: ERCP with internal biliary stent placement as above. These images were submitted for radiologic interpretation only. Please see the procedural report for the amount of contrast and the fluoroscopy time utilized. Electronically Signed   By: Sandi Mariscal M.D.   On: 03/09/2020 09:14    Scheduled Meds: . bisacodyl  5 mg Oral Once  . diltiazem  120 mg Oral Daily  . feeding supplement  237 mL Oral BID BM  . indomethacin  100 mg Rectal Once  . insulin aspart  0-5 Units Subcutaneous QHS  . insulin aspart  0-9 Units Subcutaneous TID WC  . insulin aspart  3 Units Subcutaneous TID WC  . insulin detemir  12 Units Subcutaneous Daily  . irbesartan  150 mg Oral Daily  . melatonin  3 mg Oral QHS  . multivitamin with minerals  1 tablet Oral Daily  . pantoprazole  40 mg Oral Daily  . sodium chloride flush  3 mL Intravenous Q12H   Continuous Infusions: . 0.9 % NaCl with KCl 40 mEq / L 90 mL/hr at 03/10/20 1222     LOS: 5 days   Time spent: 35 minutes.  Patrecia Pour, MD [facility] [email or web address] 03/10/2020, 3:06 PM

## 2020-03-10 NOTE — Progress Notes (Signed)
General Surgery  Bilirubin downtrending today. Patient is afebrile with stable vitals. Reports her nausea has improved. Plan to proceed with port placement tomorrow morning as scheduled. Keep NPO after midnight tonight. Discussed plan with patient.  Michaelle Birks, MD [facility] Surgery General, Hepatobiliary and Pancreatic Surgery 03/10/20 7:35 AM

## 2020-03-10 NOTE — Progress Notes (Signed)
CRITICAL VALUE ALERT  Critical Value:  Potassium 2.10mmol/L  Date & Time Notified:  03/10/2020 0502  Provider Notified: X. Blount,Np  Orders Received/Actions taken: Awaiting orders

## 2020-03-10 NOTE — Plan of Care (Signed)

## 2020-03-11 ENCOUNTER — Inpatient Hospital Stay (HOSPITAL_COMMUNITY): Payer: Medicare Other

## 2020-03-11 ENCOUNTER — Inpatient Hospital Stay (HOSPITAL_COMMUNITY): Payer: Medicare Other | Admitting: Anesthesiology

## 2020-03-11 ENCOUNTER — Encounter (HOSPITAL_COMMUNITY)
Admission: EM | Disposition: A | Discharge status: Home / Self Care | Payer: Self-pay | Source: Home / Self Care | Attending: Family Medicine

## 2020-03-11 ENCOUNTER — Ambulatory Visit (HOSPITAL_COMMUNITY): Admission: RE | Admit: 2020-03-11 | Payer: Medicare Other | Source: Home / Self Care | Admitting: Surgery

## 2020-03-11 HISTORY — PX: PORTACATH PLACEMENT: SHX2246

## 2020-03-11 LAB — GLUCOSE, CAPILLARY
Glucose-Capillary: 208 mg/dL — ABNORMAL HIGH (ref 70–99)
Glucose-Capillary: 223 mg/dL — ABNORMAL HIGH (ref 70–99)
Glucose-Capillary: 270 mg/dL — ABNORMAL HIGH (ref 70–99)
Glucose-Capillary: 374 mg/dL — ABNORMAL HIGH (ref 70–99)

## 2020-03-11 LAB — MAGNESIUM: Magnesium: 1.6 mg/dL — ABNORMAL LOW (ref 1.7–2.4)

## 2020-03-11 LAB — COMPREHENSIVE METABOLIC PANEL
ALT: 83 U/L — ABNORMAL HIGH (ref 0–44)
AST: 35 U/L (ref 15–41)
Albumin: 1.9 g/dL — ABNORMAL LOW (ref 3.5–5.0)
Alkaline Phosphatase: 489 U/L — ABNORMAL HIGH (ref 38–126)
Anion gap: 8 (ref 5–15)
BUN: 8 mg/dL (ref 8–23)
CO2: 20 mmol/L — ABNORMAL LOW (ref 22–32)
Calcium: 7.6 mg/dL — ABNORMAL LOW (ref 8.9–10.3)
Chloride: 100 mmol/L (ref 98–111)
Creatinine, Ser: 0.38 mg/dL — ABNORMAL LOW (ref 0.44–1.00)
GFR, Estimated: >60 mL/min (ref >60)
Glucose, Bld: 115 mg/dL — ABNORMAL HIGH (ref 70–99)
Potassium: 3.6 mmol/L (ref 3.5–5.1)
Sodium: 128 mmol/L — ABNORMAL LOW (ref 135–145)
Total Bilirubin: 2.8 mg/dL — ABNORMAL HIGH (ref 0.3–1.2)
Total Protein: 4.9 g/dL — ABNORMAL LOW (ref 6.5–8.1)

## 2020-03-11 IMAGING — DX DG CHEST 1V PORT
1 series · 1 of 1 positions shown · non-contrast
Comparison: [DATE] chest radiograph.

CLINICAL DATA: Port-A-Cath placement, pancreas cancer

EXAM:
PORTABLE CHEST 1 VIEW

[chest]
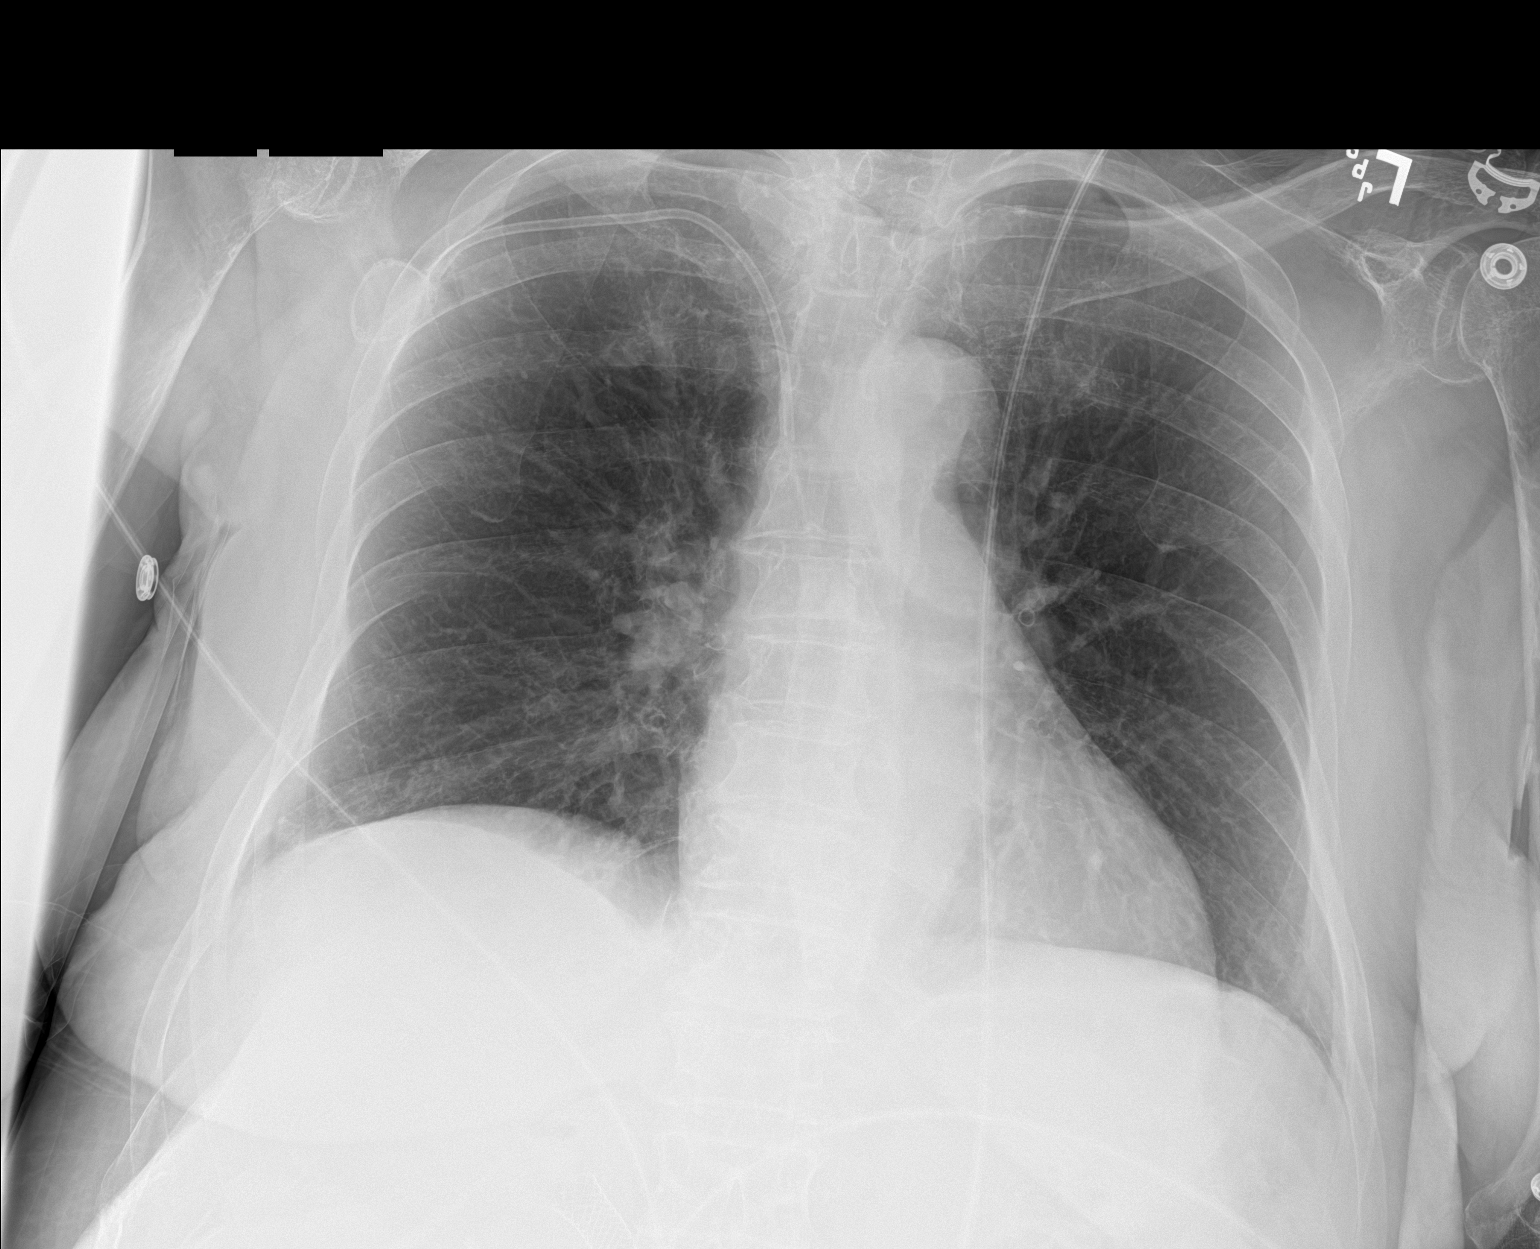

[1 of 1 positions shown; findings below may reference images not displayed]

FINDINGS: Right subclavian Port-A-Cath terminates in upper third of the SVC.
Stable cardiomediastinal silhouette with normal heart size. No
pneumothorax. No pleural effusion. Lungs appear clear, with no acute
consolidative airspace disease and no pulmonary edema. Partially
visualized biliary stent in the right upper quadrant of the abdomen.
IMPRESSION: Right subclavian Port-A-Cath terminates in the upper third of the
SVC. No pneumothorax. No active cardiopulmonary disease.

## 2020-03-11 SURGERY — INSERTION, TUNNELED CENTRAL VENOUS DEVICE, WITH PORT
Anesthesia: General

## 2020-03-11 MED ORDER — 0.9 % SODIUM CHLORIDE (POUR BTL) OPTIME
TOPICAL | Status: DC | PRN
  Administered 2020-03-11: 1000 mL

## 2020-03-11 MED ORDER — DEXAMETHASONE SODIUM PHOSPHATE 10 MG/ML IJ SOLN
INTRAMUSCULAR | Status: DC | PRN
  Administered 2020-03-11: 10 mg via INTRAVENOUS

## 2020-03-11 MED ORDER — ENOXAPARIN SODIUM 40 MG/0.4ML ~~LOC~~ SOLN
40.0000 mg | SUBCUTANEOUS | Status: DC
  Administered 2020-03-12: 40 mg via SUBCUTANEOUS
  Filled 2020-03-11: qty 0.4

## 2020-03-11 MED ORDER — HEPARIN SOD (PORK) LOCK FLUSH 100 UNIT/ML IV SOLN
INTRAVENOUS | Status: DC | PRN
  Administered 2020-03-11: 500 [IU] via INTRAVENOUS

## 2020-03-11 MED ORDER — OXYCODONE HCL 5 MG/5ML PO SOLN: 5.0000 mg | Freq: Once | ORAL | Status: DC | PRN

## 2020-03-11 MED ORDER — ONDANSETRON HCL 4 MG/2ML IJ SOLN
INTRAMUSCULAR | Status: DC | PRN
  Administered 2020-03-11: 4 mg via INTRAVENOUS

## 2020-03-11 MED ORDER — MAGNESIUM SULFATE 2 GM/50ML IV SOLN
2.0000 g | Freq: Once | INTRAVENOUS | Status: DC
  Filled 2020-03-11: qty 50

## 2020-03-11 MED ORDER — PHENYLEPHRINE 40 MCG/ML (10ML) SYRINGE FOR IV PUSH (FOR BLOOD PRESSURE SUPPORT)
PREFILLED_SYRINGE | INTRAVENOUS | Status: DC | PRN
  Administered 2020-03-11: 80 ug via INTRAVENOUS
  Administered 2020-03-11: 40 ug via INTRAVENOUS
  Administered 2020-03-11 (×2): 80 ug via INTRAVENOUS
  Administered 2020-03-11 (×2): 120 ug via INTRAVENOUS

## 2020-03-11 MED ORDER — SODIUM CHLORIDE 0.9 % IV SOLN: INTRAVENOUS | Status: DC | PRN

## 2020-03-11 MED ORDER — FENTANYL CITRATE (PF) 100 MCG/2ML IJ SOLN: 25.0000 ug | INTRAMUSCULAR | Status: DC | PRN

## 2020-03-11 MED ORDER — ACETAMINOPHEN 10 MG/ML IV SOLN: 1000.0000 mg | Freq: Once | INTRAVENOUS | Status: DC | PRN

## 2020-03-11 MED ORDER — AMISULPRIDE (ANTIEMETIC) 5 MG/2ML IV SOLN: 10.0000 mg | Freq: Once | INTRAVENOUS | Status: DC | PRN

## 2020-03-11 MED ORDER — ONDANSETRON HCL 4 MG/2ML IJ SOLN: 4.0000 mg | Freq: Once | INTRAMUSCULAR | Status: DC | PRN

## 2020-03-11 MED ORDER — BUPIVACAINE-EPINEPHRINE 0.25% -1:200000 IJ SOLN
INTRAMUSCULAR | Status: DC | PRN
  Administered 2020-03-11: 5 mL

## 2020-03-11 MED ORDER — ENOXAPARIN SODIUM 30 MG/0.3ML ~~LOC~~ SOLN: 30.0000 mg | SUBCUTANEOUS | Status: DC

## 2020-03-11 MED ORDER — PROPOFOL 10 MG/ML IV BOLUS
INTRAVENOUS | Status: DC | PRN
  Administered 2020-03-11: 120 mg via INTRAVENOUS

## 2020-03-11 MED ORDER — LACTATED RINGERS IV SOLN: INTRAVENOUS | Status: DC | PRN

## 2020-03-11 MED ORDER — FENTANYL CITRATE (PF) 250 MCG/5ML IJ SOLN
INTRAMUSCULAR | Status: DC | PRN
  Administered 2020-03-11: 25 ug via INTRAVENOUS

## 2020-03-11 MED ORDER — OXYCODONE HCL 5 MG PO TABS: 5.0000 mg | ORAL_TABLET | Freq: Once | ORAL | Status: DC | PRN

## 2020-03-11 MED ORDER — LIDOCAINE 2% (20 MG/ML) 5 ML SYRINGE
INTRAMUSCULAR | Status: DC | PRN
  Administered 2020-03-11: 80 mg via INTRAVENOUS

## 2020-03-11 SURGICAL SUPPLY — 42 items
ADH SKN CLS APL DERMABOND .7 (GAUZE/BANDAGES/DRESSINGS) ×1
APL PRP STRL LF DISP 70% ISPRP (MISCELLANEOUS) ×1
BAG DECANTER FOR FLEXI CONT (MISCELLANEOUS) ×2 IMPLANT
CHLORAPREP W/TINT 26 (MISCELLANEOUS) ×2 IMPLANT
COVER SURGICAL LIGHT HANDLE (MISCELLANEOUS) ×2 IMPLANT
COVER TRANSDUCER ULTRASND GEL (DISPOSABLE) IMPLANT
COVER WAND RF STERILE (DRAPES) ×1 IMPLANT
DERMABOND ADVANCED (GAUZE/BANDAGES/DRESSINGS) ×1
DERMABOND ADVANCED .7 DNX12 (GAUZE/BANDAGES/DRESSINGS) ×1 IMPLANT
DRAPE C-ARM 42X120 X-RAY (DRAPES) ×2 IMPLANT
DRAPE LAPAROSCOPIC ABDOMINAL (DRAPES) ×2 IMPLANT
ELECT CAUTERY BLADE 6.4 (BLADE) ×2 IMPLANT
ELECT REM PT RETURN 9FT ADLT (ELECTROSURGICAL) ×2
ELECTRODE REM PT RTRN 9FT ADLT (ELECTROSURGICAL) ×1 IMPLANT
GAUZE SPONGE 2X2 8PLY STRL LF (GAUZE/BANDAGES/DRESSINGS) ×1 IMPLANT
GEL ULTRASOUND 20GR AQUASONIC (MISCELLANEOUS) IMPLANT
GLOVE BIOGEL PI IND STRL 6 (GLOVE) ×1 IMPLANT
GLOVE BIOGEL PI INDICATOR 6 (GLOVE) ×1
GLOVE SURG SYN 5.5 (GLOVE) ×2 IMPLANT
GLOVE SURG SYN 5.5 PF PI (GLOVE) ×1 IMPLANT
GOWN STRL REUS W/ TWL LRG LVL3 (GOWN DISPOSABLE) ×2 IMPLANT
GOWN STRL REUS W/TWL LRG LVL3 (GOWN DISPOSABLE) ×4
INTRODUCER COOK 11FR (CATHETERS) IMPLANT
KIT BASIN OR (CUSTOM PROCEDURE TRAY) ×2 IMPLANT
KIT PORT POWER 8FR ISP CVUE (Port) ×1 IMPLANT
KIT TURNOVER KIT B (KITS) ×2 IMPLANT
NS IRRIG 1000ML POUR BTL (IV SOLUTION) ×2 IMPLANT
PAD ARMBOARD 7.5X6 YLW CONV (MISCELLANEOUS) ×2 IMPLANT
PENCIL BUTTON HOLSTER BLD 10FT (ELECTRODE) ×2 IMPLANT
POSITIONER HEAD DONUT 9IN (MISCELLANEOUS) ×2 IMPLANT
SET INTRODUCER 12FR PACEMAKER (INTRODUCER) IMPLANT
SET SHEATH INTRODUCER 10FR (MISCELLANEOUS) IMPLANT
SHEATH COOK PEEL AWAY SET 9F (SHEATH) IMPLANT
SPONGE GAUZE 2X2 STER 10/PKG (GAUZE/BANDAGES/DRESSINGS) ×1
SUT MNCRL AB 4-0 PS2 18 (SUTURE) ×2 IMPLANT
SUT PROLENE 2 0 SH DA (SUTURE) ×3 IMPLANT
SUT VIC AB 3-0 SH 27 (SUTURE) ×2
SUT VIC AB 3-0 SH 27X BRD (SUTURE) ×1 IMPLANT
SYR 5ML LUER SLIP (SYRINGE) ×2 IMPLANT
TOWEL GREEN STERILE (TOWEL DISPOSABLE) ×2 IMPLANT
TOWEL GREEN STERILE FF (TOWEL DISPOSABLE) ×2 IMPLANT
TRAY LAPAROSCOPIC MC (CUSTOM PROCEDURE TRAY) ×2 IMPLANT

## 2020-03-11 NOTE — Progress Notes (Signed)
2 Days Post-Op  Subjective: Bilirubin continues to downtrend. Afebrile, vitals stable. Nausea improving.   Objective: Vital signs in last 24 hours: Temp:  [97.9 F (36.6 C)-98.5 F (36.9 C)] 98.5 F (36.9 C) (02/02 0502) Pulse Rate:  [70-77] 76 (02/02 0502) Resp:  [16-18] 16 (02/02 0502) BP: (134-146)/(57-72) 137/68 (02/02 0502) SpO2:  [97 %-98 %] 97 % (02/02 0502) Last BM Date: 03/08/20  Intake/Output from previous day: 02/01 0701 - 02/02 0700 In: 1388.3 [P.O.:720; I.V.:618.3; IV Piggyback:50] Out: -  Intake/Output this shift: No intake/output data recorded.  PE: General: resting comfortably, NAD Neuro: alert and oriented Resp: normal work of breathing Abdomen: soft, nondistended Extremities: warm and well-perfused  Lab Results:  Recent Labs    03/10/20 0342  WBC 8.4  HGB 9.9*  HCT 27.6*  PLT 238   BMET Recent Labs    03/10/20 0342 03/11/20 0441  NA 129* 128*  K 2.7* 3.6  CL 99 100  CO2 23 20*  GLUCOSE 202* 115*  BUN 14 8  CREATININE 0.48 0.38*  CALCIUM 8.2* 7.6*   PT/INR No results for input(s): LABPROT, INR in the last 72 hours. CMP     Component Value Date/Time   NA 128 (L) 03/11/2020 0441   NA 136 03/03/2020 1801   K 3.6 03/11/2020 0441   CL 100 03/11/2020 0441   CO2 20 (L) 03/11/2020 0441   GLUCOSE 115 (H) 03/11/2020 0441   BUN 8 03/11/2020 0441   BUN 12 03/03/2020 1801   CREATININE 0.38 (L) 03/11/2020 0441   CALCIUM 7.6 (L) 03/11/2020 0441   PROT 4.9 (L) 03/11/2020 0441   PROT 6.1 03/03/2020 1801   ALBUMIN 1.9 (L) 03/11/2020 0441   ALBUMIN 3.1 (L) 03/03/2020 1801   AST 35 03/11/2020 0441   ALT 83 (H) 03/11/2020 0441   ALKPHOS 489 (H) 03/11/2020 0441   BILITOT 2.8 (H) 03/11/2020 0441   BILITOT 1.9 (H) 03/03/2020 1801   GFRNONAA >60 03/11/2020 0441   GFRAA 109 03/03/2020 1801   Lipase     Component Value Date/Time   LIPASE 12 03/05/2020 0857       Studies/Results: DG ERCP BILIARY & PANCREATIC DUCTS  Result Date:  03/09/2020 CLINICAL DATA:  Pancreatic head mass.  Post ERCP. EXAM: ERCP TECHNIQUE: Multiple spot images obtained with the fluoroscopic device and submitted for interpretation post-procedure. COMPARISON:  CT abdomen pelvis-03/05/2020 FLUOROSCOPY TIME:  5 minutes, 48 seconds FINDINGS: Twelve spot intraoperative fluoroscopic images the right upper abdominal quadrant ERCP are provided for review. Initial image demonstrates an ERCP probe overlying the right upper abdominal quadrant. Cholecystectomy clips overlies expected location of the gallbladder fossa There is selective cannulation and opacification the common bile duct which appears moderately dilated centrally. Subsequent images demonstrate placement of an internal metallic biliary stent overlying expected location of the CBD. Subsequent images demonstrate biliary plasty within the mid aspect of the stent with improved stent opening. There is minimal opacification intrahepatic biliary tree which appears mildly dilated. There is no definitive opacification of the residual cystic duct or the pancreatic duct. IMPRESSION: ERCP with internal biliary stent placement as above. These images were submitted for radiologic interpretation only. Please see the procedural report for the amount of contrast and the fluoroscopy time utilized. Electronically Signed   By: Sandi Mariscal M.D.   On: 03/09/2020 09:14    Anti-infectives: Anti-infectives (From admission, onward)   Start     Dose/Rate Route Frequency Ordered Stop   03/11/20 0600  vancomycin (VANCOCIN) IVPB  1000 mg/200 mL premix        1,000 mg 200 mL/hr over 60 Minutes Intravenous On call to O.R. 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 Admitted with jaundice and evidence  of disease progression. S/p ERCP with biliary stent placement. - Proceed to OR this morning for port placement - Risks and benefits discussed with patient, including risk of bleeding, infection and pneumothorax. Informed consent obtained. - Ok for diet as tolerated after procedure - May access port 48 hours after placement   LOS: 6 days   Michaelle Birks, MD [facility] Surgery General, Hepatobiliary and Pancreatic Surgery 03/11/20 7:23 AM

## 2020-03-11 NOTE — Progress Notes (Signed)
Received pt from PACU, s/p port a cath placement. Pt alert/oriented in no apparent distress, surgical site CDI. No complaints.

## 2020-03-11 NOTE — Anesthesia Postprocedure Evaluation (Signed)
Anesthesia Post Note  Patient: Nancy Hawkins  Procedure(s) Performed: INSERTION PORT-A-CATH (N/A )     Patient location during evaluation: PACU Anesthesia Type: General Level of consciousness: awake Pain management: pain level controlled Vital Signs Assessment: post-procedure vital signs reviewed and stable Respiratory status: spontaneous breathing and respiratory function stable Cardiovascular status: stable Postop Assessment: no apparent nausea or vomiting Anesthetic complications: no   No complications documented.  Last Vitals:  Vitals:   03/11/20 1104 03/11/20 1208  BP: 121/67 121/70  Pulse: 79 79  Resp: 18 17  Temp: 36.7 C 36.7 C  SpO2: 100% 100%    Last Pain:  Vitals:   03/11/20 1208  TempSrc: Oral  PainSc:                  Merlinda Frederick

## 2020-03-11 NOTE — Anesthesia Procedure Notes (Signed)
Procedure Name: LMA Insertion Date/Time: 03/11/2020 9:16 AM Performed by: Renato Shin, CRNA Pre-anesthesia Checklist: Patient identified, Emergency Drugs available, Suction available and Patient being monitored Patient Re-evaluated:Patient Re-evaluated prior to induction Oxygen Delivery Method: Circle system utilized Preoxygenation: Pre-oxygenation with 100% oxygen Induction Type: IV induction Ventilation: Mask ventilation without difficulty LMA: LMA inserted LMA Size: 4.0 Placement Confirmation: positive ETCO2 and breath sounds checked- equal and bilateral Tube secured with: Tape Dental Injury: Teeth and Oropharynx as per pre-operative assessment

## 2020-03-11 NOTE — Op Note (Signed)
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 She was initially scheduled for port placement for neoadjuvant chemotherapy on 02/12/20, however she contracted COVID necessitating delay of her procedure. In the interval she developed obstructive jaundice and was admitted this past weekend, with new imaging showing evidence of disease progression. She has undergone ERCP with biliary stent placement and jaundice is now improving. She is to undergo port placement today.  Findings: 8-Fr single lumen powerport placed via the right subclavian vein under fluoroscopic guidance. Total catheter length of 13cm.  Procedure details: Informed consent was obtained in the preoperative area prior to the procedure. The patient was brought to the operating room and placed on the table in the supine position. General anesthesia was induced and appropriate lines and drains were placed for intraoperative monitoring. Perioperative antibiotics were administered per SCIP guidelines. The neck and chest were prepped and draped in the usual sterile fashion. A pre-procedure timeout was taken verifying patient identity, surgical site and procedure to be performed.  The patient was placed in Trendelenberg and the right subclavian vein was accessed with a large-bore needle. A guidewire was inserted and advanced, and position in the SVC was confirmed fluoroscopically. The needle was removed and the wire was clipped to the drapes to secure its position. A small skin incision was made around the wire and a subcutaneous pocket was created with cautery. The  port and catheter were then flushed and brought onto the field. Three 3-0 prolene sutures were used to secure the port in the subcutaneous pocket, but the sutures were not tied down. The port was placed in the pocket. The catheter was then measured using fluoro - it was placed over the skin adjacent to the guidewire, and marked externally at the cavoatrial junction. The catheter was then cut at this location, which was at 13cm. The dilator and sheath were then advanced over the guidewire, and the wire and dilator were removed. The end of the catheter was inserted through the sheath and advanced, and the sheath was peeled away. The port was then accessed with a Huber needle, and blood was aspirated and the port was flushed with heparinized saline. A final fluoroscopic image confirmed appropriate position of the catheter tip within the SVC, without kinking of the catheter. The prolene sutures were tied down. A final flush of 500 units heparin (100 units/mL) was given via the port. The skin was closed with a deep layer of interrupted 3-0 Vicryl suture, followed by a running subcuticular 4-0 monocryl suture. Dermabond was applied.  The patient tolerated the procedure well with no apparent complications. All counts were correct x2 at the end of the procedure. The patient was extubated and taken to PACU in stable condition.  Michaelle Birks, MD 03/11/20 10:03 AM

## 2020-03-11 NOTE — Transfer of Care (Signed)
Immediate Anesthesia Transfer of Care Note  Patient: Nancy Hawkins  Procedure(s) Performed: INSERTION PORT-A-CATH (N/A )  Patient Location: PACU  Anesthesia Type:General  Level of Consciousness: awake and patient cooperative  Airway & Oxygen Therapy: Patient Spontanous Breathing and Patient connected to nasal cannula oxygen  Post-op Assessment: Report given to RN and Post -op Vital signs reviewed and stable  Post vital signs: Reviewed and stable  Last Vitals:  Vitals Value Taken Time  BP    Temp    Pulse 78 03/11/20 1003  Resp 16 03/11/20 1003  SpO2 96 % 03/11/20 1003  Vitals shown include unvalidated device data.  Last Pain:  Vitals:   03/11/20 0502  TempSrc: Oral  PainSc:          Complications: No complications documented.

## 2020-03-11 NOTE — Progress Notes (Signed)
PROGRESS NOTE  Nancy Hawkins  WPV:[id number] DOB: Mar 29, 1940 DOA: 03/05/2020 PCP: Lilian Coma., MD   Brief Narrative: Nancy Hawkins is a 80 y.o. 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 ERCP was performed 1/29 where the ampulla was accessed though guidewire could not be advanced. IV hydration was given with symptomatic improvement and ERCP was reattempted 1/31 with successful biliary stent placement and dilatation.   Assessment & Plan: Principal Problem:   Nausea & vomiting Active Problems:   Primary cancer of head of pancreas (HCC)   Abdominal pain   Diabetes mellitus type 2, uncontrolled (HCC)   Protein malnutrition (HCC)   Transaminitis   Hyperbilirubinemia  Locally advanced pancreatic cancer: No evidence of metastatic disease on recent imaging which was personally reviewed (CT 1/27).  - Dr. Benay Spice planning neoadjuvant FOLFIRINOX to begin the week of 03/23/2020 as long as LFTs improving. s/p port placement by Dr. [name] 2/2, may access in 48 hours if needed. Currently has intact peripheral access.  - Ultimate plan may include resection. - IV antiemetics, IV analgesics ordered. Restart VTE ppx 24 hrs post procedure.  Nausea, vomiting, diarrhea: Most likely attributable to pancreatic CA enlargement with biliary obstruction, though of note hepatitis A IgM Ab is positive.  - No directed therapy indicated at this time.  - Added colace  Hypokalemia: Severe, improved with aggressive supplementation. - Continue IV repletion.  Hyponatremia:  - Continue isotonic saline  GERD:  - Added PPI  Obstructive jaundice due to enlarging pancreatic mass:  - s/p covered metal bile duct stent placement at ERCP 1/31. 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 HbA1c 7.2%.  - Covering with SSI. Restarted basal insulin 12u, mealtime insulin at [facility], continue sensitive scale for now. Anticipate brittle CBGs and about to be NPO with unpredictable po intake. Will allow some permissive hyperglycemia for now.  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  - No evidence of post-ERCP pancreatitis, will continue to monitor closely.   History of covid-19 infection: Diagnosed 02/10/2020 with mild-moderate symptoms since resolved. Isolation/symptoms delayed port placement. CT abd/pelvix from 1/27 personally reviewed with respect to RLL patchy opacities. This could very well be resolving covid. There are no symptoms of pneumonia, no hypoxia, leukocytosis, etc. at this time to suggest need to pursue this further at this time. - No further Tx, isolation, or work up necessary at this time.  History of PAF, HTN: No recurrence noted. No anticoagulation chronically. - Continue diltiazem, ARB   DVT prophylaxis: SCDs Code Status: Full Family Communication: None at bedside Disposition Plan:  Status is: Inpatient  Remains inpatient appropriate because:Ongoing active pain requiring inpatient pain management, Ongoing diagnostic testing needed not appropriate for outpatient work up and IV treatments appropriate due to intensity of illness or inability to take PO   Dispo: The patient is from: Home              Anticipated d/c is to: Home              Anticipated d/c date is: 1 day - anticipate ability to return home once tolerating adequate po intake. Has thus far failed to do that, starting IV fluids and managing severe  hypokalemia today. With her intolerance to anesthesia, it is unlikely that she will fly well enough after port placement tomorrow either. Hopeful for 2/3.              Patient currently is not medically stable to d/c.   Consultants:   GI  General  surgery  Oncology  Procedures:   Port-A-Cath insertion 03/11/2020 Dr. [name].  ERCP 03/09/2020 Dr. Rush Landmark:  Impression:       - Prior minor papilla sphincterotomy noted.                           - The major papilla appeared normal.                           - A biliary tract obstruction secondary to the                            known pancreatic head mass was found in the lower third of the main duct.                           - The upper third of the main bile duct and left                            and right hepatic ducts and all intrahepatic                            branches were moderately dilated, secondary to the                            stricture.                           - A biliary sphincterotomy was performed.                           - One covered metal biliary stent was placed into                            the common bile duct. This was dilated to improve                            the waist of the stent after placement with good                            effect. Recommendation: - The patient will be observed post-procedure,                            until all discharge criteria are met.                           - Return patient to hospital ward for ongoing care.                           - Advance diet as tolerated.                           -  Check liver enzymes (AST, ALT, alkaline                            phosphatase, bilirubin) in the morning.                           - Observe patient's clinical course.                           - Would hold chemical VTE prophylaxis for 24 hours                            to decrease risk of post-interventional bleeding.                            If anticoagulation is necessary consider heparin                            drip without bolus in 6-12 hours and monitor                            closely.                           - Watch for pancreatitis, bleeding, perforation,                            and  cholangitis.                           - Repeat LFTs at [facility] or with Oncology clinic                            in 2-weeks upon discharge.                           - The findings and recommendations were discussed with the patient.                           - The findings and recommendations were discussed with the patient's family.   ERCP 03/07/2020 Dr. Benson Norway:  Impression:       - The major papilla appeared normal.                           - The minor papilla appeared normal.                           - A pancreatic sphincterotomy was performed. Recommendation: - Return patient to hospital ward for ongoing care.                           - Resume regular diet.                           - Reattempt ERCP on Monday with Dr. Rush Landmark.  Antimicrobials:  Ciprofloxacin peri-procedurally x2  Subjective: Feeling drowsy after procedure, has yet to eat. Pain in abdomen is stable-to-improving. Mild nausea still noted.  Objective: Vitals:   03/11/20 1048 03/11/20 1104 03/11/20 1208 03/11/20 1300  BP: 135/64 121/67 121/70 120/67  Pulse: 72 79 79 81  Resp: '20 18 17 16  ' Temp: 97.8 F (36.6 C) 98 F (36.7 C) 98 F (36.7 C) 98 F (36.7 C)  TempSrc:  Oral Oral Oral  SpO2: 96% 100% 100% 100%  Weight:      Height:        Intake/Output Summary (Last 24 hours) at 03/11/2020 1404 Last data filed at 03/11/2020 1300 Gross per 24 hour  Intake 1275.69 ml  Output 50 ml  Net 1225.69 ml   Filed Weights   03/05/20 0842 03/07/20 1157  Weight: 58.5 kg 58.5 kg   Gen: 80 y.o. female in no distress Pulm: Nonlabored breathing room air. Clear. CV: Regular rate and rhythm. No murmur, rub, or gallop. No JVD, no dependent edema. GI: Abdomen soft, mildly tender in epigastrium, non-distended, with normoactive bowel sounds.  Ext: Warm, no deformities Skin: No rashes, lesions or ulcers on visualized skin. Neuro: Drowsy but rousable and oriented. No focal neurological deficits. Psych: Judgement and  insight appear fair. Mood euthymic & affect congruent. Behavior is appropriate.    Data Reviewed: I have personally reviewed following labs and imaging studies  CBC: Recent Labs  Lab 03/05/20 0857 03/06/20 0355 03/08/20 0317 03/10/20 0342  WBC 6.2 9.0 7.5 8.4  NEUTROABS  --   --  5.2  --   HGB 11.3* 10.9* 9.9* 9.9*  HCT 35.3* 33.2* 28.7* 27.6*  MCV 89.8 89.7 84.7 82.1  PLT 263 258 200 017   Basic Metabolic Panel: Recent Labs  Lab 03/05/20 0857 03/06/20 0355 03/08/20 1857 03/10/20 0342 03/11/20 0441  NA 139 138 130* 129* 128*  K 3.7 3.7 3.1* 2.7* 3.6  CL 106 106 100 99 100  CO2 21* 17* 21* 23 20*  GLUCOSE 151* 143* 232* 202* 115*  BUN '14 12 11 14 8  ' CREATININE 0.63 0.50 0.60 0.48 0.38*  CALCIUM 8.4* 8.1* 7.9* 8.2* 7.6*  MG  --   --   --   --  1.6*   GFR: Estimated Creatinine Clearance: 47.5 mL/min (A) (by C-G formula based on SCr of 0.38 mg/dL (L)). Liver Function Tests: Recent Labs  Lab 03/05/20 0857 03/08/20 1857 03/10/20 0342 03/11/20 0441  AST 267* 156* 52* 35  ALT 318* 179* 121* 83*  ALKPHOS 748* 668* 617* 489*  BILITOT 4.0* 8.4* 3.7* 2.8*  PROT 6.4* 5.3* 4.9* 4.9*  ALBUMIN 2.6* 2.0* 1.9* 1.9*   Recent Labs  Lab 03/05/20 0857  LIPASE 12   No results for input(s): AMMONIA in the last 168 hours. Coagulation Profile: Recent Labs  Lab 03/05/20 0942  INR 1.2   Cardiac Enzymes: No results for input(s): CKTOTAL, CKMB, CKMBINDEX, TROPONINI in the last 168 hours. BNP (last 3 results) No results for input(s): PROBNP in the last 8760 hours. HbA1C: No results for input(s): HGBA1C in the last 72 hours. CBG: Recent Labs  Lab 03/10/20 1129 03/10/20 1626 03/10/20 2025 03/11/20 1006 03/11/20 1128  GLUCAP 285* 247* 160* 208* 223*   Lipid Profile: No results for input(s): CHOL, HDL, LDLCALC, TRIG, CHOLHDL, LDLDIRECT in the last 72 hours. Thyroid Function Tests: No results for input(s): TSH, T4TOTAL, FREET4, T3FREE, THYROIDAB in the last 72  hours. Anemia Panel: No results for input(s): VITAMINB12, FOLATE, FERRITIN, TIBC, IRON, RETICCTPCT in the  last 72 hours. Urine analysis:    Component Value Date/Time   COLORURINE AMBER (A) 03/05/2020 0852   APPEARANCEUR HAZY (A) 03/05/2020 0852   LABSPEC 1.013 03/05/2020 0852   PHURINE 6.0 03/05/2020 0852   GLUCOSEU NEGATIVE 03/05/2020 0852   HGBUR NEGATIVE 03/05/2020 0852   BILIRUBINUR SMALL (A) 03/05/2020 0852   BILIRUBINUR small (A) 11/17/2019 0906   KETONESUR 20 (A) 03/05/2020 0852   PROTEINUR NEGATIVE 03/05/2020 0852   UROBILINOGEN 0.2 11/17/2019 0906   NITRITE NEGATIVE 03/05/2020 0852   LEUKOCYTESUR NEGATIVE 03/05/2020 0852   Recent Results (from the past 240 hour(s))  SARS Coronavirus 2 by RT PCR (hospital order, performed in [facility] hospital lab) Nasopharyngeal Nasopharyngeal Swab     Status: Abnormal   Collection Time: 03/05/20  4:16 PM   Specimen: Nasopharyngeal Swab  Result Value Ref Range Status   SARS Coronavirus 2 POSITIVE (A) NEGATIVE Final    Comment: RESULT CALLED TO, READ BACK BY AND VERIFIED WITH: Z CAGLE RN 1820 03/05/20 A BROWNING (NOTE) SARS-CoV-2 target nucleic acids are DETECTED  SARS-CoV-2 RNA is generally detectable in upper respiratory specimens  during the acute phase of infection.  Positive results are indicative  of the presence of the identified virus, but do not rule out bacterial infection or co-infection with other pathogens not detected by the test.  Clinical correlation with patient history and  other diagnostic information is necessary to determine patient infection status.  The expected result is negative.  Fact Sheet for Patients:   StrictlyIdeas.no   Fact Sheet for Healthcare Providers:   BankingDealers.co.za    This test is not yet approved or cleared by the [place] FDA and  has been authorized for detection and/or diagnosis of SARS-CoV-2 by FDA under an Emergency Use  Authorization (EUA).  This EUA will remain in effect (meaning this te st can be used) for the duration of  the COVID-19 declaration under Section 564(b)(1) of the Act, 21 U.S.C. section 360-bbb-3(b)(1), unless the authorization is terminated or revoked sooner.  Performed at [facility], [place] [address] [place], [place] [address]   MRSA PCR Screening     Status: None   Collection Time: 03/09/20  5:20 AM   Specimen: Nasopharyngeal  Result Value Ref Range Status   MRSA by PCR NEGATIVE NEGATIVE Final    Comment:        The GeneXpert MRSA Assay (FDA approved for NASAL specimens only), is one component of a comprehensive MRSA colonization surveillance program. It is not intended to diagnose MRSA infection nor to guide or monitor treatment for MRSA infections. Performed at [facility], [place] [address], [place], [place] [address]       Radiology Studies: DG CHEST PORT 1 VIEW  Result Date: 03/11/2020 CLINICAL DATA:  Port-A-Cath placement, pancreas cancer EXAM: PORTABLE CHEST 1 VIEW COMPARISON:  01/20/2020 chest radiograph. FINDINGS: Right subclavian Port-A-Cath terminates in upper third of the SVC. Stable cardiomediastinal silhouette with normal heart size. No pneumothorax. No pleural effusion. Lungs appear clear, with no acute consolidative airspace disease and no pulmonary edema. Partially visualized biliary stent in the right upper quadrant of the abdomen. IMPRESSION: Right subclavian Port-A-Cath terminates in the upper third of the SVC. No pneumothorax. No active cardiopulmonary disease. Electronically Signed   By: Ilona Sorrel M.D.   On: 03/11/2020 10:26   DG Fluoro Guide CV Line-No Report  Result Date: 03/11/2020 Fluoroscopy was utilized by the requesting physician.  No radiographic interpretation.    Scheduled  Meds: . diltiazem  120 mg Oral Daily  . feeding supplement (GLUCERNA SHAKE)  237 mL Oral BID BM  . indomethacin  100 mg Rectal Once  . insulin aspart  0-5  Units Subcutaneous QHS  . insulin aspart  0-9 Units Subcutaneous TID WC  . insulin aspart  3 Units Subcutaneous TID WC  . insulin detemir  12 Units Subcutaneous Daily  . irbesartan  150 mg Oral Daily  . melatonin  3 mg Oral QHS  . multivitamin with minerals  1 tablet Oral Daily  . pantoprazole  40 mg Oral Daily  . Ensure Max Protein  11 oz Oral QHS  . sodium chloride flush  3 mL Intravenous Q12H   Continuous Infusions: . 0.9 % NaCl with KCl 40 mEq / L 90 mL/hr at 03/11/20 0741  . magnesium sulfate bolus IVPB       LOS: 6 days   Time spent: 25 minutes.  Patrecia Pour, MD [facility] [email or web address] 03/11/2020, 2:04 PM

## 2020-03-11 NOTE — Progress Notes (Signed)
Pt left to unit with transporter to OR for a procedure. Pt remains alert/oriented in no apparent distress.

## 2020-03-11 NOTE — Discharge Instructions (Signed)
SURGERY DISCHARGE INSTRUCTIONS: PORT-A-CATH PLACEMENT  Activity . You may resume your usual activities as tolerated . Ok to shower in 48 hours, but do not bathe or submerge incisions underwater. . Do not drive while taking narcotic pain medication.  Wound Care . Your incision is covered with skin glue called Dermabond. This will peel off on its own over time. . You may shower and allow warm soapy water to run over your incisions. Gently pat dry. . Do not submerge your incision underwater. . Monitor your incision for any new redness, tenderness, or drainage. . You may start using your port in 48 hours after placement. Do not apply EMLA cream directly over the Dermabond (skin glue).  When to Call us: Marland Kitchen Fever greater than 100.5 . New redness, drainage, or swelling at incision site . Severe pain, nausea, or vomiting . Shortness of breath, difficulty breathing  For questions or concerns, please call the [facility] Surgery office at [phone].

## 2020-03-12 ENCOUNTER — Encounter (HOSPITAL_COMMUNITY): Payer: Self-pay | Admitting: Surgery

## 2020-03-12 LAB — COMPREHENSIVE METABOLIC PANEL WITH GFR
ALT: 70 U/L — ABNORMAL HIGH (ref 0–44)
AST: 27 U/L (ref 15–41)
Albumin: 2 g/dL — ABNORMAL LOW (ref 3.5–5.0)
Alkaline Phosphatase: 431 U/L — ABNORMAL HIGH (ref 38–126)
Anion gap: 10 (ref 5–15)
BUN: 9 mg/dL (ref 8–23)
CO2: 20 mmol/L — ABNORMAL LOW (ref 22–32)
Calcium: 7.9 mg/dL — ABNORMAL LOW (ref 8.9–10.3)
Chloride: 98 mmol/L (ref 98–111)
Creatinine, Ser: 0.51 mg/dL (ref 0.44–1.00)
GFR, Estimated: >60 mL/min
Glucose, Bld: 253 mg/dL — ABNORMAL HIGH (ref 70–99)
Potassium: 4.2 mmol/L (ref 3.5–5.1)
Sodium: 128 mmol/L — ABNORMAL LOW (ref 135–145)
Total Bilirubin: 2.8 mg/dL — ABNORMAL HIGH (ref 0.3–1.2)
Total Protein: 5.2 g/dL — ABNORMAL LOW (ref 6.5–8.1)

## 2020-03-12 LAB — GLUCOSE, CAPILLARY
Glucose-Capillary: 228 mg/dL — ABNORMAL HIGH (ref 70–99)
Glucose-Capillary: 222 mg/dL — ABNORMAL HIGH (ref 70–99)

## 2020-03-12 LAB — MAGNESIUM: Magnesium: 1.5 mg/dL — ABNORMAL LOW (ref 1.7–2.4)

## 2020-03-12 MED ORDER — OXYCODONE HCL 5 MG PO TABS: 5.0000 mg | ORAL_TABLET | Freq: Four times a day (QID) | ORAL | 0 refills | Status: DC | PRN

## 2020-03-12 NOTE — Care Management Important Message (Signed)
Important Message  Patient Details  Name: MAKINZEE DURLEY MRN: [id number] Date of Birth: 1940-04-24   Medicare Important Message Given:  Yes - Important Message mailed due to current National Emergency  Verbal consent obtained due to current National Emergency  Relationship to patient: Self Contact Name: Seriah Brotzman Call Date: 03/12/20  Time: 1349 Phone: [phone] Outcome: No Answer/Busy Important Message mailed to: Patient address on file    Delorse Lek 03/12/2020, 1:49 PM

## 2020-03-12 NOTE — Discharge Summary (Addendum)
Physician Discharge Summary  Nancy Hawkins OMV:[id number] DOB: 1940-11-17 DOA: 03/05/2020  PCP: Lilian Coma., MD  Admit date: 03/05/2020 Discharge date: 03/12/2020  Admitted From: Home Disposition: Home   Recommendations for Outpatient Follow-up:  1. Follow up with PCP in 1-2 weeks 2. Please obtain CMP at follow up with attention to LFTs (improving), K (wnl at discharge), and Na (corrects to 130, stable/improving at discharge).  3. Follow up with [facility] 4. Follow up with oncology, Dr. Benay Spice 5. Follow up with surgery, Dr. Michaelle Birks after 6 cycles of neoadjuvant chemotherapy with restaging scans to reevaluate resectability of tumor.  Home Health: None Equipment/Devices: None (new port placed 2/2) Discharge Condition: Stable CODE STATUS: Full Diet recommendation: Carb-modified  Brief/Interim Summary: Nancy Hawkins is a 80 y.o. 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 ERCP was performed 1/29 where the ampulla was accessed though guidewire could not be advanced. IV hydration was given with symptomatic improvement and ERCP was reattempted 1/31 with successful biliary stent placement and dilatation. LFTs have continued improvement, symptoms stable and patient tolerating adequate per oral intake. Port-A-Cath was placed on 03/11/2020 with no immediate complications. See below for full details.  Discharge Diagnoses:  Principal Problem:   Nausea & vomiting Active Problems:   Primary cancer of head of pancreas (HCC)   Abdominal pain   Diabetes mellitus type 2, uncontrolled (HCC)   Protein malnutrition (HCC)   Transaminitis   Hyperbilirubinemia  Locally advanced pancreatic cancer: No evidence of metastatic disease on recent imaging which was personally reviewed (CT 1/27).   - Dr. Benay Spice planning neoadjuvant FOLFIRINOX to begin the week of 03/23/2020 as long as LFTs improving. s/p port placement by Dr. [name] 2/2, may access in 48 hours if needed.  - Ultimate plan may include resection. - Restarted VTE after port placement. Pt ambulatory, will not continue at DC.  - Oxycodone sent to pharmacy as this has been effective and well tolerated here. PDMP reviewed without red flags noted.  Nausea, vomiting, diarrhea: Most likely attributable to pancreatic CA enlargement with biliary obstruction, though of note hepatitis A IgM Ab is positive.  - Continue symptomatic management (symptoms improved)  Hypokalemia: Resolved - Recheck at follow up.  Hyponatremia: Improved, corrects to 130 on day of discharge. Pt taking adequate po.  GERD:  - No changes to home med.  Obstructive jaundice due to enlarging pancreatic mass:  - s/p covered metal bile duct stent placement at ERCP 1/31. 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 HbA1c 7.2%.  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  - No evidence of post-ERCP pancreatitis   History of covid-19 infection: Diagnosed 02/10/2020 with mild-moderate symptoms since resolved. Isolation/symptoms delayed port placement. CT abd/pelvix from 1/27 personally reviewed with respect to RLL patchy opacities. This could very well be resolving covid. There are no symptoms of pneumonia, no hypoxia, leukocytosis, etc. at this time to suggest need to pursue this further at this time. - No further Tx, isolation, or work up necessary at this time.  History of PAF, HTN: No recurrence noted. No anticoagulation chronically. - Continue diltiazem, ARB   Discharge Instructions Discharge Instructions    Diet Carb  Modified   Complete by: As directed    Discharge instructions   Complete by: As directed    - You may take  oxycodone as needed for pain (sent to pharmacy) - Follow up with oncology as scheduled or seek medical attention right away if you notice fever, signs of infection at the port site or worsening abdominal pain or vomiting.   Increase activity slowly   Complete by: As directed    No wound care   Complete by: As directed      Allergies as of 03/12/2020      Reactions   Cephalosporins Shortness Of Breath   Erythromycin Shortness Of Breath   Fire Ant Shortness Of Breath   Penicillins Anaphylaxis, Other (See Comments)   "25 years ago"      Medication List    STOP taking these medications   ibuprofen 200 MG tablet Commonly known as: ADVIL   ondansetron 4 MG disintegrating tablet Commonly known as: Zofran ODT   traMADol 50 MG tablet Commonly known as: ULTRAM     TAKE these medications   Basaglar KwikPen 100 UNIT/ML Inject 12 Units into the skin daily. What changed: when to take this   diltiazem 120 MG 24 hr capsule Commonly known as: TIAZAC Take 120 mg by mouth daily.   Ensure Max Protein Liqd Take 330 mLs (11 oz total) by mouth 2 (two) times daily with a meal. What changed: when to take this   famotidine 40 MG tablet Commonly known as: PEPCID Take 40 mg by mouth daily as needed for heartburn or indigestion.   fexofenadine 180 MG tablet Commonly known as: ALLEGRA Take 180 mg by mouth in the morning.   guaiFENesin 600 MG 12 hr tablet Commonly known as: MUCINEX Take 600 mg by mouth every 12 (twelve) hours as needed for cough or to loosen phlegm.   insulin aspart 100 UNIT/ML FlexPen Commonly known as: NOVOLOG Inject 3 Units into the skin 3 (three) times daily with meals. What changed: when to take this   lidocaine-prilocaine cream Commonly known as: EMLA Apply 1 application topically as needed. Apply over port 45 minutes prior to having port accessed for chemotherapy treatment.   multivitamin with minerals tablet Take 1 tablet by mouth daily.   OCUVITE EYE HEALTH  FORMULA PO Take 1 tablet by mouth daily.   ondansetron 8 MG tablet Commonly known as: ZOFRAN Take 1 tablet (8 mg total) by mouth every 8 (eight) hours as needed for nausea or vomiting.   oxyCODONE 5 MG immediate release tablet Commonly known as: Oxy IR/ROXICODONE Take 1 tablet (5 mg total) by mouth every 6 (six) hours as needed for moderate pain or severe pain.   Probiotic Daily Caps Take 1 capsule by mouth daily.   prochlorperazine 10 MG tablet Commonly known as: COMPAZINE Take 1 tablet (10 mg total) by mouth every 6 (six) hours as needed for nausea or vomiting. What changed: Another medication with the same name was removed. Continue taking this medication, and follow the directions you see here.   telmisartan 40 MG tablet Commonly known as: MICARDIS Take 40 mg by mouth daily.   triamcinolone 55 MCG/ACT Aero nasal inhaler Commonly known as: NASACORT Place 2 sprays into the nose daily as needed (for congestion).   vitamin C 500 MG tablet Commonly known as: ASCORBIC ACID Take 500 mg by mouth daily.   Vitamin D3 25 MCG (1000 UT) Caps Take 1,000 Units by mouth daily.  Follow-up Information    Ladell Pier, MD. Schedule an appointment as soon as possible for a visit.   Specialty: Oncology Contact information: [place] [address] [phone]        Dwan Bolt, MD. Schedule an appointment as soon as possible for a visit.   Specialty: General Surgery Why: after chemotherapy Contact information: [place]  [place] [address] [phone]        Mansouraty, Telford Nab., MD Follow up.   Specialties: Gastroenterology, Internal Medicine Contact information: [place] [address] [phone]              Allergies  Allergen Reactions  . Cephalosporins Shortness Of Breath  . Erythromycin Shortness Of Breath  . Fire [employer] Breath  . Penicillins Anaphylaxis and Other (See  Comments)    "25 years ago"    Consultations:  GI  Oncology  Surgery  Procedures/Studies: CT Head Wo Contrast  Result Date: 03/05/2020 CLINICAL DATA:  History of pancreatic carcinoma EXAM: CT HEAD WITHOUT CONTRAST TECHNIQUE: Contiguous axial images were obtained from the base of the skull through the vertex without intravenous contrast. COMPARISON:  None. FINDINGS: Brain: No evidence of acute infarction, hemorrhage, hydrocephalus, extra-axial collection or mass lesion/mass effect. Vascular: No hyperdense vessel or unexpected calcification. Skull: Normal. Negative for fracture or focal lesion. Sinuses/Orbits: Postsurgical changes are noted in the maxillary antra bilaterally. Mucosal thickening is noted within the ethmoid and frontal sinuses. Other: None. IMPRESSION: Changes of chronic sinusitis. No acute intracranial abnormality noted. Electronically Signed   By: Inez Catalina M.D.   On: 03/05/2020 13:32   CT ABDOMEN PELVIS W CONTRAST  Result Date: 03/05/2020 CLINICAL DATA:  Nausea and vomiting.  Abdominal pain. EXAM: CT ABDOMEN AND PELVIS WITH CONTRAST TECHNIQUE: Multidetector CT imaging of the abdomen and pelvis was performed using the standard protocol following bolus administration of intravenous contrast. CONTRAST:  137m OMNIPAQUE IOHEXOL 300 MG/ML  SOLN COMPARISON:  CT abdomen pelvis 11/24/2019. FINDINGS: Lower chest: Normal heart size. Patchy areas of consolidation right lower lobe. No pleural effusion. Hepatobiliary: Liver is diffusely low in attenuation compatible with steatosis. Prior cholecystectomy. Mild intrahepatic and extrahepatic biliary ductal dilatation. Common bile duct is dilated to the level of the pancreatic mass. Pancreas: The pancreatic duct remains dilated measuring up to 6 mm. Slight interval increase in size of ill-defined mass within the region the pancreatic head/uncinate process measuring 3.5 x 3.7 cm. 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 Minimal  peripancreatic fat stranding. Redemonstrated portosystemic collateral vessels within the upper abdomen. Spleen: Unremarkable. Adrenals/Urinary Tract: Normal adrenal glands. Kidneys enhance symmetrically with contrast. Partially exophytic cyst mid pole left kidney. Additional subcentimeter too small to characterize low-attenuation renal lesions. Urinary bladder is unremarkable. Stomach/Bowel: Pancolonic diverticulosis. No CT evidence for acute diverticulitis. Normal morphology of the stomach. Vascular/Lymphatic: Normal caliber abdominal aorta. Peripheral calcified atherosclerotic plaque. Reproductive: Prior hysterectomy. Other: None. Musculoskeletal: Multiple unchanged compression fractures involving the lower thoracic and lumbar spine. No aggressive appearing osseous lesions. IMPRESSION: 1. Slight interval increase in size of ill-defined mass within the region of the pancreatic head/uncinate process measuring up to 3.5 cm. 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 2. Hepatic steatosis. 3. Patchy areas of consolidation right lower lobe which may represent infection. Recommend continued attention on follow-up to ensure resolution. 4. Pancolonic diverticulosis without CT evidence for acute diverticulitis. 5. Multiple unchanged compression fractures  involving the lower thoracic and lumbar spine. 6. Aortic atherosclerosis. Electronically Signed   By: Lovey Newcomer M.D.   On: 03/05/2020 14:05   DG CHEST PORT 1 VIEW  Result Date: 03/11/2020 CLINICAL DATA:  Port-A-Cath placement, pancreas cancer EXAM: PORTABLE CHEST 1 VIEW COMPARISON:  01/20/2020 chest radiograph. FINDINGS: Right subclavian Port-A-Cath terminates in upper third of the SVC. Stable cardiomediastinal silhouette with normal heart size. No pneumothorax. No pleural effusion. Lungs appear clear, with no acute consolidative airspace disease and no pulmonary edema. Partially visualized  biliary stent in the right upper quadrant of the abdomen. IMPRESSION: Right subclavian Port-A-Cath terminates in the upper third of the SVC. No pneumothorax. No active cardiopulmonary disease. Electronically Signed   By: Ilona Sorrel M.D.   On: 03/11/2020 10:26   DG ERCP BILIARY & PANCREATIC DUCTS  Result Date: 03/09/2020 CLINICAL DATA:  Pancreatic head mass.  Post ERCP. EXAM: ERCP TECHNIQUE: Multiple spot images obtained with the fluoroscopic device and submitted for interpretation post-procedure. COMPARISON:  CT abdomen pelvis-03/05/2020 FLUOROSCOPY TIME:  5 minutes, 48 seconds FINDINGS: Twelve spot intraoperative fluoroscopic images the right upper abdominal quadrant ERCP are provided for review. Initial image demonstrates an ERCP probe overlying the right upper abdominal quadrant. Cholecystectomy clips overlies expected location of the gallbladder fossa There is selective cannulation and opacification the common bile duct which appears moderately dilated centrally. Subsequent images demonstrate placement of an internal metallic biliary stent overlying expected location of the CBD. Subsequent images demonstrate biliary plasty within the mid aspect of the stent with improved stent opening. There is minimal opacification intrahepatic biliary tree which appears mildly dilated. There is no definitive opacification of the residual cystic duct or the pancreatic duct. IMPRESSION: ERCP with internal biliary stent placement as above. These images were submitted for radiologic interpretation only. Please see the procedural report for the amount of contrast and the fluoroscopy time utilized. Electronically Signed   By: Sandi Mariscal M.D.   On: 03/09/2020 09:14   DG Fluoro Guide CV Line-No Report  Result Date: 03/11/2020 Fluoroscopy was utilized by the requesting physician.  No radiographic interpretation.   DG C-Arm 1-60 Min-No Report  Result Date: 03/07/2020 Fluoroscopy was utilized by the requesting physician.   No radiographic interpretation.       Port-A-Cath insertion 03/11/2020 Dr. [name].  ERCP 03/09/2020 Dr. Rush Landmark:  Impression: - Prior minor papilla sphincterotomy noted. - The major papilla appeared normal. - A biliary tract obstruction secondary to the  known pancreatic head mass was found in the lowerthird of the main duct. - The upper third of the main bile duct and left  and right hepatic ducts and all intrahepatic  branches were moderately dilated, secondary to the  stricture. - A biliary sphincterotomy was performed. - One covered metal biliary stent was placed into  the common bile duct. This was dilated to improve  the waist of the stent after placement with good  effect. Recommendation: - The patient will be observed post-procedure,  until all discharge criteria are met. - Return patient to hospital ward for ongoing care. - Advance diet as tolerated. - Check liver enzymes (AST, ALT, alkaline  phosphatase, bilirubin) in the morning. - Observe patient's clinical course. - Would hold chemical VTE prophylaxis for 24 hours  to decrease risk of post-interventional bleeding.  If anticoagulation is necessary consider heparin  drip without bolus in 6-12 hours and monitor  closely. - Watch for pancreatitis, bleeding, perforation,  and  cholangitis. - Repeat LFTs at [facility] or with  Oncology clinic  in 2-weeks upon discharge. - The findings and recommendations were discussed with the patient. - The findings and recommendations were discussed with the patient's family.   ERCP 03/07/2020 Dr. Benson Norway:  Impression: - The major papilla appeared normal. - The minor papilla appeared normal. - A pancreatic sphincterotomy was performed. Recommendation: - Return patient to hospital ward for ongoing care. - Resume regular diet. - Reattempt ERCP on Monday with Dr. Rush Landmark.  Subjective: Abdominal cramping is stable, overall improved. Had large nonbloody BM this AM. Eating normally, normal urination becoming less dark. Wants to go home. No significant pain at port site.  Discharge Exam: Vitals:   03/12/20 0539 03/12/20 0807  BP: 133/69 132/60  Pulse: 79 72  Resp: 20 17  Temp: 97.9 F (36.6 C) 98.4 F (36.9 C)  SpO2: 96% 99%   General: Pt is alert, awake, not in acute distress Cardiovascular: RRR, S1/S2 +, no rubs, no gallops Respiratory: CTA bilaterally, no wheezing, no rhonchi Abdominal: Soft, modestly tender to palpation, +BS.  Skin: Port site w/dermabod without significant tenderness, erythema, discharge. Extremities: No edema, no cyanosis  Labs: BNP (last 3 results) No results for input(s): BNP in the last 8760 hours. Basic Metabolic Panel: Recent Labs  Lab 03/06/20 0355 03/08/20 1857 03/10/20 0342 03/11/20 0441 03/12/20 0239  NA 138 130* 129* 128* 128*  K 3.7 3.1* 2.7* 3.6 4.2  CL 106 100 99 100 98  CO2 17* 21* 23 20* 20*  GLUCOSE 143* 232* 202* 115* 253*  BUN '12 11 14 8 9  ' CREATININE 0.50 0.60 0.48 0.38* 0.51  CALCIUM 8.1* 7.9* 8.2* 7.6* 7.9*  MG  --   --   --  1.6* 1.5*   Liver Function  Tests: Recent Labs  Lab 03/08/20 1857 03/10/20 0342 03/11/20 0441 03/12/20 0239  AST 156* 52* 35 27  ALT 179* 121* 83* 70*  ALKPHOS 668* 617* 489* 431*  BILITOT 8.4* 3.7* 2.8* 2.8*  PROT 5.3* 4.9* 4.9* 5.2*  ALBUMIN 2.0* 1.9* 1.9* 2.0*   No results for input(s): LIPASE, AMYLASE in the last 168 hours. No results for input(s): AMMONIA in the last 168 hours. CBC: Recent Labs  Lab 03/06/20 0355 03/08/20 0317 03/10/20 0342  WBC 9.0 7.5 8.4  NEUTROABS  --  5.2  --   HGB 10.9* 9.9* 9.9*  HCT 33.2* 28.7* 27.6*  MCV 89.7 84.7 82.1  PLT 258 200 238   Cardiac Enzymes: No results for input(s): CKTOTAL, CKMB, CKMBINDEX, TROPONINI in the last 168 hours. BNP: Invalid input(s): POCBNP CBG: Recent Labs  Lab 03/11/20 1006 03/11/20 1128 03/11/20 1637 03/11/20 1953 03/12/20 0653  GLUCAP 208* 223* 270* 374* 228*   D-Dimer No results for input(s): DDIMER in the last 72 hours. Hgb A1c No results for input(s): HGBA1C in the last 72 hours. Lipid Profile No results for input(s): CHOL, HDL, LDLCALC, TRIG, CHOLHDL, LDLDIRECT in the last 72 hours. Thyroid function studies No results for input(s): TSH, T4TOTAL, T3FREE, THYROIDAB in the last 72 hours.  Invalid input(s): FREET3 Anemia work up No results for input(s): VITAMINB12, FOLATE, FERRITIN, TIBC, IRON, RETICCTPCT in the last 72 hours. Urinalysis    Component Value Date/Time   COLORURINE AMBER (A) 03/05/2020 0852   APPEARANCEUR HAZY (A) 03/05/2020 0852   LABSPEC 1.013 03/05/2020 0852   PHURINE 6.0 03/05/2020 0852   GLUCOSEU NEGATIVE 03/05/2020 0852   HGBUR NEGATIVE 03/05/2020 0852   BILIRUBINUR SMALL (A) 03/05/2020 0852   BILIRUBINUR small (A) 11/17/2019 0906   Benjamin Stain  20 (A) 03/05/2020 0852   PROTEINUR NEGATIVE 03/05/2020 0852   UROBILINOGEN 0.2 11/17/2019 0906   NITRITE NEGATIVE 03/05/2020 [place] 03/05/2020 0852    Microbiology Recent Results (from the past 240 hour(s))  SARS Coronavirus 2 by  RT PCR (hospital order, performed in [facility] hospital lab) Nasopharyngeal Nasopharyngeal Swab     Status: Abnormal   Collection Time: 03/05/20  4:16 PM   Specimen: Nasopharyngeal Swab  Result Value Ref Range Status   SARS Coronavirus 2 POSITIVE (A) NEGATIVE Final    Comment: RESULT CALLED TO, READ BACK BY AND VERIFIED WITH: Z CAGLE RN 1820 03/05/20 A BROWNING (NOTE) SARS-CoV-2 target nucleic acids are DETECTED  SARS-CoV-2 RNA is generally detectable in upper respiratory specimens  during the acute phase of infection.  Positive results are indicative  of the presence of the identified virus, but do not rule out bacterial infection or co-infection with other pathogens not detected by the test.  Clinical correlation with patient history and  other diagnostic information is necessary to determine patient infection status.  The expected result is negative.  Fact Sheet for Patients:   StrictlyIdeas.no   Fact Sheet for Healthcare Providers:   BankingDealers.co.za    This test is not yet approved or cleared by the [place] FDA and  has been authorized for detection and/or diagnosis of SARS-CoV-2 by FDA under an Emergency Use Authorization (EUA).  This EUA will remain in effect (meaning this te st can be used) for the duration of  the COVID-19 declaration under Section 564(b)(1) of the Act, 21 U.S.C. section 360-bbb-3(b)(1), unless the authorization is terminated or revoked sooner.  Performed at [facility], [place] [address], [place], [place] [address]   MRSA PCR Screening     Status: None   Collection Time: 03/09/20  5:20 AM   Specimen: Nasopharyngeal  Result Value Ref Range Status   MRSA by PCR NEGATIVE NEGATIVE Final    Comment:        The GeneXpert MRSA Assay (FDA approved for NASAL specimens only), is one component of a comprehensive MRSA colonization surveillance program. It is not intended to diagnose  MRSA infection nor to guide or monitor treatment for MRSA infections. Performed at [facility], [place] [address] [place], [place] [address]     Time coordinating discharge: Approximately 40 minutes  Patrecia Pour, MD  [facility] 03/12/2020, 11:02 AM

## 2020-03-12 NOTE — Progress Notes (Signed)
Pt was given her AVS discharge summary and went over with her. IV was removed with catheter intact. Pt had no further questions. She is being transported home by daughter.

## 2020-03-12 NOTE — Plan of Care (Signed)
  Problem: Education: Goal: Knowledge of General Education information will improve Description: Including pain rating scale, medication(s)/side effects and non-pharmacologic comfort measures Outcome: Adequate for Discharge   Problem: Health Behavior/Discharge Planning: Goal: Ability to manage health-related needs will improve Outcome: Adequate for Discharge   Problem: Clinical Measurements: Goal: Ability to maintain clinical measurements within normal limits will improve Outcome: Adequate for Discharge Goal: Will remain free from infection Outcome: Adequate for Discharge Goal: Diagnostic test results will improve Outcome: Adequate for Discharge Goal: Respiratory complications will improve Outcome: Adequate for Discharge Goal: Cardiovascular complication will be avoided Outcome: Adequate for Discharge   Problem: Activity: Goal: Risk for activity intolerance will decrease Outcome: Adequate for Discharge   Problem: Nutrition: Goal: Adequate nutrition will be maintained Outcome: Adequate for Discharge   Problem: Coping: Goal: Level of anxiety will decrease Outcome: Adequate for Discharge   Problem: Elimination: Goal: Will not experience complications related to bowel motility Outcome: Adequate for Discharge Goal: Will not experience complications related to urinary retention Outcome: Adequate for Discharge   Problem: Pain Managment: Goal: General experience of comfort will improve Outcome: Adequate for Discharge   Problem: Safety: Goal: Ability to remain free from injury will improve Outcome: Adequate for Discharge   Problem: Skin Integrity: Goal: Risk for impaired skin integrity will decrease Outcome: Adequate for Discharge   Problem: Increased Nutrient Needs (NI-5.1) Goal: Food and/or nutrient delivery Description: Individualized approach for food/nutrient provision. Outcome: Adequate for Discharge   

## 2020-03-12 NOTE — Progress Notes (Signed)
1 Day Post-Op  Subjective: Bilirubin stable at 2.8. Afebrile, vitals stable. Patient denies nausea/vomiting.    Objective: Vital signs in last 24 hours: Temp:  [97.3 F (36.3 C)-98.5 F (36.9 C)] 97.9 F (36.6 C) (02/03 0539) Pulse Rate:  [70-81] 79 (02/03 0539) Resp:  [16-25] 20 (02/03 0539) BP: (112-135)/(53-70) 133/69 (02/03 0539) SpO2:  [95 %-100 %] 96 % (02/03 0539) Last BM Date: 03/08/20  Intake/Output from previous day: 02/02 0701 - 02/03 0700 In: 1300 [P.O.:600; I.V.:500; IV Piggyback:200] Out: 50 [Blood:50] Intake/Output this shift: No intake/output data recorded.  PE: General: resting comfortably, NAD Neuro: alert and oriented Resp: normal work of breathing Chest: portacath in place R upper chest wall, incision is clean and in tact, mild overlying edema and ecchymosis but no induration or erythema. Extremities: warm and well-perfused  Lab Results:  Recent Labs    03/10/20 0342  WBC 8.4  HGB 9.9*  HCT 27.6*  PLT 238   BMET Recent Labs    03/11/20 0441 03/12/20 0239  NA 128* 128*  K 3.6 4.2  CL 100 98  CO2 20* 20*  GLUCOSE 115* 253*  BUN 8 9  CREATININE 0.38* 0.51  CALCIUM 7.6* 7.9*   PT/INR No results for input(s): LABPROT, INR in the last 72 hours. CMP     Component Value Date/Time   NA 128 (L) 03/12/2020 0239   NA 136 03/03/2020 1801   K 4.2 03/12/2020 0239   CL 98 03/12/2020 0239   CO2 20 (L) 03/12/2020 0239   GLUCOSE 253 (H) 03/12/2020 0239   BUN 9 03/12/2020 0239   BUN 12 03/03/2020 1801   CREATININE 0.51 03/12/2020 0239   CALCIUM 7.9 (L) 03/12/2020 0239   PROT 5.2 (L) 03/12/2020 0239   PROT 6.1 03/03/2020 1801   ALBUMIN 2.0 (L) 03/12/2020 0239   ALBUMIN 3.1 (L) 03/03/2020 1801   AST 27 03/12/2020 0239   ALT 70 (H) 03/12/2020 0239   ALKPHOS 431 (H) 03/12/2020 0239   BILITOT 2.8 (H) 03/12/2020 0239   BILITOT 1.9 (H) 03/03/2020 1801   GFRNONAA >60 03/12/2020 0239   GFRAA 109 03/03/2020 1801   Lipase     Component  Value Date/Time   LIPASE 12 03/05/2020 0857       Studies/Results: DG CHEST PORT 1 VIEW  Result Date: 03/11/2020 CLINICAL DATA:  Port-A-Cath placement, pancreas cancer EXAM: PORTABLE CHEST 1 VIEW COMPARISON:  01/20/2020 chest radiograph. FINDINGS: Right subclavian Port-A-Cath terminates in upper third of the SVC. Stable cardiomediastinal silhouette with normal heart size. No pneumothorax. No pleural effusion. Lungs appear clear, with no acute consolidative airspace disease and no pulmonary edema. Partially visualized biliary stent in the right upper quadrant of the abdomen. IMPRESSION: Right subclavian Port-A-Cath terminates in the upper third of the SVC. No pneumothorax. No active cardiopulmonary disease. Electronically Signed   By: Ilona Sorrel M.D.   On: 03/11/2020 10:26   DG Fluoro Guide CV Line-No Report  Result Date: 03/11/2020 Fluoroscopy was utilized by the requesting physician.  No radiographic interpretation.    Anti-infectives: Anti-infectives (From admission, onward)   Start     Dose/Rate Route Frequency Ordered Stop   03/11/20 0600  vancomycin (VANCOCIN) IVPB 1000 mg/200 mL premix        1,000 mg 200 mL/hr over 60 Minutes Intravenous On call to O.R. 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 Admitted with jaundice and evidence of disease progression. S/p ERCP with biliary stent placement. POD1 s/p right subclavian portacath placement. - Postop CXR showed no pneumothorax and appropriate position of port - Prefer that port not be accessed during this hospitalization - Port instructions verbally reviewed with patient this morning, written discharge instructions have also been placed in the AVS. - I will see the patient in follow up after 6  cycles of neoadjuvant chemotherapy with restaging scans to re-evaluate resectability of her tumor. - Please call with any further questions or concerns.   LOS: 7 days   Michaelle Birks, MD [facility] Surgery General, Hepatobiliary and Pancreatic Surgery 03/12/20 6:32 AM

## 2020-03-16 ENCOUNTER — Ambulatory Visit: Payer: Medicare Other

## 2020-03-16 ENCOUNTER — Other Ambulatory Visit: Payer: Medicare Other

## 2020-03-16 ENCOUNTER — Telehealth: Payer: Self-pay

## 2020-03-16 ENCOUNTER — Ambulatory Visit: Payer: Medicare Other | Admitting: Oncology

## 2020-03-16 NOTE — Telephone Encounter (Signed)
Spoke with patient's husband Richardson Landry. He declined Palliative Care services at this time. Will update teams, notify PCP, and cancel referral.

## 2020-03-17 ENCOUNTER — Encounter (HOSPITAL_COMMUNITY): Payer: Self-pay | Admitting: Surgery

## 2020-03-20 ENCOUNTER — Telehealth: Payer: Self-pay | Admitting: *Deleted

## 2020-03-20 DIAGNOSIS — C250 Malignant neoplasm of head of pancreas: Secondary | ICD-10-CM

## 2020-03-20 MED ORDER — TRAMADOL HCL 50 MG PO TABS
50.0000 mg | ORAL_TABLET | Freq: Four times a day (QID) | ORAL | 0 refills | Status: DC | PRN
Start: 2020-03-20 — End: 2020-06-16

## 2020-03-20 NOTE — Telephone Encounter (Signed)
Daughter called to inquire if Dr. Benay Spice ordered home health for her? They received a call from a nursing agency to come out to see her? Also asking to see dietician asap due to her poor appetite and intake/nausea. Patient got on phone and asking for Tramadol prn for pain?  Does not want to take the oxycodone unless pain is severe. Daughter lives w/patient and she does all the cooking, so she would like to be present for visit with dietician.  Per Dr. Benay Spice: OK for Tramadol and dietician referral placed.

## 2020-03-23 ENCOUNTER — Inpatient Hospital Stay (HOSPITAL_BASED_OUTPATIENT_CLINIC_OR_DEPARTMENT_OTHER): Payer: Medicare Other | Admitting: Nurse Practitioner

## 2020-03-23 ENCOUNTER — Inpatient Hospital Stay: Payer: Medicare Other

## 2020-03-23 ENCOUNTER — Inpatient Hospital Stay: Payer: Medicare Other | Attending: Nurse Practitioner

## 2020-03-23 ENCOUNTER — Other Ambulatory Visit: Payer: Self-pay

## 2020-03-23 ENCOUNTER — Encounter: Payer: Self-pay | Admitting: Nurse Practitioner

## 2020-03-23 VITALS — BP 148/70 | HR 92 | Temp 97.8°F | Resp 18 | Ht 63.0 in | Wt 122.7 lb

## 2020-03-23 DIAGNOSIS — Z5111 Encounter for antineoplastic chemotherapy: Secondary | ICD-10-CM | POA: Insufficient documentation

## 2020-03-23 DIAGNOSIS — C250 Malignant neoplasm of head of pancreas: Secondary | ICD-10-CM

## 2020-03-23 DIAGNOSIS — Z79899 Other long term (current) drug therapy: Secondary | ICD-10-CM | POA: Diagnosis not present

## 2020-03-23 DIAGNOSIS — C251 Malignant neoplasm of body of pancreas: Secondary | ICD-10-CM | POA: Insufficient documentation

## 2020-03-23 DIAGNOSIS — K859 Acute pancreatitis without necrosis or infection, unspecified: Secondary | ICD-10-CM | POA: Diagnosis not present

## 2020-03-23 DIAGNOSIS — E119 Type 2 diabetes mellitus without complications: Secondary | ICD-10-CM | POA: Diagnosis not present

## 2020-03-23 DIAGNOSIS — I10 Essential (primary) hypertension: Secondary | ICD-10-CM | POA: Diagnosis not present

## 2020-03-23 DIAGNOSIS — Z8616 Personal history of COVID-19: Secondary | ICD-10-CM | POA: Insufficient documentation

## 2020-03-23 DIAGNOSIS — Z95828 Presence of other vascular implants and grafts: Secondary | ICD-10-CM

## 2020-03-23 LAB — CBC WITH DIFFERENTIAL (CANCER CENTER ONLY)
Abs Immature Granulocytes: 0.01 10*3/uL (ref 0.00–0.07)
Basophils Absolute: 0.1 10*3/uL (ref 0.0–0.1)
Basophils Relative: 1 %
Eosinophils Absolute: 0.1 10*3/uL (ref 0.0–0.5)
Eosinophils Relative: 1 %
HCT: 32.1 % — ABNORMAL LOW (ref 36.0–46.0)
Hemoglobin: 10.7 g/dL — ABNORMAL LOW (ref 12.0–15.0)
Immature Granulocytes: 0 %
Lymphocytes Relative: 19 %
Lymphs Abs: 1.2 10*3/uL (ref 0.7–4.0)
MCH: 30.2 pg (ref 26.0–34.0)
MCHC: 33.3 g/dL (ref 30.0–36.0)
MCV: 90.7 fL (ref 80.0–100.0)
Monocytes Absolute: 0.6 10*3/uL (ref 0.1–1.0)
Monocytes Relative: 10 %
Neutro Abs: 4.4 10*3/uL (ref 1.7–7.7)
Neutrophils Relative %: 69 %
Platelet Count: 239 10*3/uL (ref 150–400)
RBC: 3.54 MIL/uL — ABNORMAL LOW (ref 3.87–5.11)
RDW: 16 % — ABNORMAL HIGH (ref 11.5–15.5)
WBC Count: 6.5 10*3/uL (ref 4.0–10.5)
nRBC: 0 % (ref 0.0–0.2)

## 2020-03-23 LAB — CMP (CANCER CENTER ONLY)
ALT: 29 U/L (ref 0–44)
AST: 20 U/L (ref 15–41)
Albumin: 2.9 g/dL — ABNORMAL LOW (ref 3.5–5.0)
Alkaline Phosphatase: 239 U/L — ABNORMAL HIGH (ref 38–126)
Anion gap: 5 (ref 5–15)
BUN: 9 mg/dL (ref 8–23)
CO2: 21 mmol/L — ABNORMAL LOW (ref 22–32)
Calcium: 8.2 mg/dL — ABNORMAL LOW (ref 8.9–10.3)
Chloride: 109 mmol/L (ref 98–111)
Creatinine: 0.61 mg/dL (ref 0.44–1.00)
GFR, Estimated: >60 mL/min (ref >60)
Glucose, Bld: 206 mg/dL — ABNORMAL HIGH (ref 70–99)
Potassium: 3.8 mmol/L (ref 3.5–5.1)
Sodium: 135 mmol/L (ref 135–145)
Total Bilirubin: 1.8 mg/dL — ABNORMAL HIGH (ref 0.3–1.2)
Total Protein: 6.1 g/dL — ABNORMAL LOW (ref 6.5–8.1)

## 2020-03-23 MED ORDER — SODIUM CHLORIDE 0.9% FLUSH
10.0000 mL | Freq: Once | INTRAVENOUS | Status: AC
  Administered 2020-03-23: 10 mL via INTRAVENOUS
  Filled 2020-03-23: qty 10

## 2020-03-23 MED ORDER — SODIUM CHLORIDE 0.9% FLUSH
10.0000 mL | INTRAVENOUS | Status: DC | PRN
  Filled 2020-03-23: qty 10

## 2020-03-23 MED ORDER — DEXTROSE 5 % IV SOLN
Freq: Once | INTRAVENOUS | Status: AC
  Filled 2020-03-23: qty 250

## 2020-03-23 MED ORDER — DEXTROSE 5 % IV SOLN
Freq: Once | INTRAVENOUS | Status: DC
  Filled 2020-03-23: qty 250

## 2020-03-23 MED ORDER — PALONOSETRON HCL INJECTION 0.25 MG/5ML
0.2500 mg | Freq: Once | INTRAVENOUS | Status: AC
  Administered 2020-03-23: 0.25 mg via INTRAVENOUS

## 2020-03-23 MED ORDER — OXALIPLATIN CHEMO INJECTION 100 MG/20ML
85.0000 mg/m2 | Freq: Once | INTRAVENOUS | Status: AC
  Administered 2020-03-23: 135 mg via INTRAVENOUS
  Filled 2020-03-23: qty 20

## 2020-03-23 MED ORDER — LEUCOVORIN CALCIUM INJECTION 350 MG
400.0000 mg/m2 | Freq: Once | INTRAVENOUS | Status: AC
  Administered 2020-03-23: 628 mg via INTRAVENOUS
  Filled 2020-03-23: qty 31.4

## 2020-03-23 MED ORDER — SODIUM CHLORIDE 0.9 % IV SOLN
2400.0000 mg/m2 | INTRAVENOUS | Status: DC
  Administered 2020-03-23: 3750 mg via INTRAVENOUS
  Filled 2020-03-23: qty 75

## 2020-03-23 MED ORDER — HEPARIN SOD (PORK) LOCK FLUSH 100 UNIT/ML IV SOLN
500.0000 [IU] | Freq: Once | INTRAVENOUS | Status: DC | PRN
  Filled 2020-03-23: qty 5

## 2020-03-23 MED ORDER — PALONOSETRON HCL INJECTION 0.25 MG/5ML
INTRAVENOUS | Status: AC
  Filled 2020-03-23: qty 5

## 2020-03-23 MED ORDER — SODIUM CHLORIDE 0.9 % IV SOLN
10.0000 mg | Freq: Once | INTRAVENOUS | Status: AC
  Administered 2020-03-23: 10 mg via INTRAVENOUS
  Filled 2020-03-23: qty 10

## 2020-03-23 NOTE — Patient Instructions (Signed)

## 2020-03-23 NOTE — Patient Instructions (Signed)
[place] Discharge Instructions for Patients Receiving Chemotherapy  Today you received the following chemotherapy agents Oxaliplatinin, Leukovorin, Florouricil  To help prevent nausea and vomiting after your treatment, we encourage you to take your nausea medication as directed.   If you develop nausea and vomiting that is not controlled by your nausea medication, call the clinic.   BELOW ARE SYMPTOMS THAT SHOULD BE REPORTED IMMEDIATELY:  *FEVER GREATER THAN 100.5 F  *CHILLS WITH OR WITHOUT FEVER  NAUSEA AND VOMITING THAT IS NOT CONTROLLED WITH YOUR NAUSEA MEDICATION  *UNUSUAL SHORTNESS OF BREATH  *UNUSUAL BRUISING OR BLEEDING  TENDERNESS IN MOUTH AND THROAT WITH OR WITHOUT PRESENCE OF ULCERS  *URINARY PROBLEMS  *BOWEL PROBLEMS  UNUSUAL RASH Items with * indicate a potential emergency and should be followed up as soon as possible.  Feel free to call the clinic should you have any questions or concerns. The clinic phone number is [phone].  Please show the [facility] at check-in to the Emergency Department and triage nurse.

## 2020-03-23 NOTE — Progress Notes (Addendum)
[place] OFFICE PROGRESS NOTE   Diagnosis: Pancreas cancer  INTERVAL HISTORY:   Nancy Hawkins returns as scheduled.  Overall she is feeling well.  No nausea or vomiting.  No significant diarrhea.  Stool is mostly formed and soft.  Appetite is about "50%".  Objective:  Vital signs in last 24 hours:  Blood pressure (!) 148/70, pulse 92, temperature 97.8 F (36.6 C), temperature source Tympanic, resp. rate 18, height 5\' 3"  (1.6 m), weight 122 lb 11.2 oz (55.7 kg), SpO2 100 %.    HEENT: No thrush or ulcers. Resp: Lungs clear bilaterally. Cardio: Regular rate and rhythm. GI: Abdomen soft and nontender.  No hepatomegaly. Vascular: No leg edema. Port-A-Cath without erythema.  Lab Results:  Lab Results  Component Value Date   WBC 6.5 03/23/2020   HGB 10.7 (L) 03/23/2020   HCT 32.1 (L) 03/23/2020   MCV 90.7 03/23/2020   PLT 239 03/23/2020   NEUTROABS 4.4 03/23/2020    Imaging:  No results found.  Medications: I have reviewed the patient's current medications.  Assessment/Plan: 1. Pancreas cancer, borderline resectable   CT abdomen/pelvis-inflammation involving the mid body of the pancreas with peripancreatic fat stranding; possible 2.5 x 2.2 cm low-attenuation mass within the mid body of the pancreas, appearsto abut the celiac axis as well as the splenic vein.   MRI 11/18/2019-extensive inflammatory fat stranding about the pancreas with a thinly septated multicystic fluid signal lesion of the anterior pancreatic neck measuring 3.2 x 1.7 cm most consistent with a pancreatic pseudocyst; numerous additional small fluid signal lesions throughout the pancreatic parenchyma, the remaining lesions subcentimeter; central portion of pancreatic duct dilated measuring up to 8 mm without obvious obstructing lesion identified; trace ascites; trace bilateral pleural effusions and associated atelectasis or consolidation.  Colonoscopy 11/20/2019-5 polyps(resected and  retrieved-multiple tubular adenomas, negative for high-grade dysplasia); 2 colonicangioectasias; diverticulosis in the entire examined colon; nonbleeding nonthrombosed external and internal hemorrhoids.   EGD/EUS 11/20/2019-few white nummular lesions in the entire esophagus (esophagus biopsy with no significant pathologic findings, no fungal elements); hematin found in the entire examined stomach; gastritis (stomach biopsy showed gastropathy/gastritis); 14 mm semisessile polyp found in the posterior wall of the gastric antrum (hyperplastic polyp);a lesion was identified in the pancreatic body staged T2 N0 MX.FNA of the pancreas lesion showed benign reactive/reparative changes, prominent inflammation.   EUS procedure 01/20/2020-masslike region identified in the pancreatic head, stage T2 N0 MX. 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  CTs chest/pelvis 01/29/2020-no evidence of metastatic disease  CT abdomen/pelvis 03/05/2020-increase in size of pancreas head/uncinate mass, marked narrowing of the adjacent portal vein, patchy consolidation in the right lower lung, mild intrahepatic and extrahepatic biliary ductal dilatation  Cycle 1 FOLFOX 03/23/2020 2. Pancreatitis October 2021 3. Diabetes October 2021 4. Isolated episode of atrial fibrillation approximately 15 years ago 5. Hypertension 6. COVID-19 infection 7. Admission 03/05/2020 with biliary obstruction-ERCP 03/09/2020, biliary obstruction secondary to known pancreatic head mass found in the lower third of the main duct.  The upper third of the main bile duct and left and right hepatic ducts and all intrahepatic branches moderately dilated secondary to the stricture.  Biliary sphincterotomy performed.  Covered metal biliary stent placed into the common bile duct. 8. Port-A-Cath placement 03/11/2020, Dr. [name]   Disposition: Nancy Hawkins appears stable. She is scheduled to begin treatment  today with FOLFOX. We again reviewed potential toxicities. She agrees to proceed. She understands the plan is to add irinotecan with cycle  2 pending tolerance of cycle 1. She has lost a significant amount of weight. Chemotherapy doses will be adjusted to account for this.  We reviewed the CBC and chemistry panel from today. Labs are adequate to proceed with treatment. Bilirubin improved to 1.8 following stent placement.  She will return for lab, follow-up, cycle 2 chemotherapy in 2 weeks. She will contact the office in the interim with any problems.  Patient seen with Dr. Benay Spice.   Ned Card ANP/GNP-BC   03/23/2020  11:44 AM  This was a shared visit with Ned Card.  Ms. Tassinari has recovered from the admission with biliary obstruction.  She appears stable to begin chemotherapy.  She will complete cycle 1 FOLFOX today.  We dose adjusted the chemotherapy based on her current weight.  I was present for greater than 50% of today's visit.  I performed medical decision making.  Julieanne Manson, MD

## 2020-03-24 ENCOUNTER — Telehealth: Payer: Self-pay | Admitting: Nurse Practitioner

## 2020-03-24 ENCOUNTER — Telehealth: Payer: Self-pay | Admitting: *Deleted

## 2020-03-24 ENCOUNTER — Other Ambulatory Visit: Payer: Self-pay | Admitting: Nurse Practitioner

## 2020-03-24 DIAGNOSIS — C250 Malignant neoplasm of head of pancreas: Secondary | ICD-10-CM

## 2020-03-24 MED ORDER — LORAZEPAM 0.5 MG PO TABS: 0.5000 mg | ORAL_TABLET | Freq: Three times a day (TID) | ORAL | 0 refills | Status: DC | PRN

## 2020-03-24 NOTE — Telephone Encounter (Signed)
Called patient back w/directions to take Tylenol for her headache (Tramadol could have upset her stomach). Continue Compazine every 6 hours as need (take at 4:30 pm). Script for lorazepam was sent to try SL every 8 hours as needed for nausea as well. Call early in am if not better and will try to arrange for IV fluids and decadron here. She understands and agrees.

## 2020-03-24 NOTE — Telephone Encounter (Signed)
Sister called to report Nancy Hawkins was having N/V. She is not with her currently. Called patient to discuss her symptoms. Had omlet for breakfast today at 0530 and stayed down well. Developed nausea around 10:30 and took compazine and proceed to vomit about 20 minutes later (unsure if she vomited pill up or not). Has vomited ~ 3 times since then. Vomits 1/2 to 1 cup of what appears to be gastric liquid only. Has been able to take some sips of clear liquids since then and a small amount of pudding has stayed down.  Developed a headache at same time as nausea that she rates now 7/10 (took a Tramadol w/compazine earlier). Having some occasional chills, but no fever (98.6). Does not feel she needs to come in today for fluids, but asking when could she repeat the antiemetic and tramadol?

## 2020-03-24 NOTE — Telephone Encounter (Signed)
Scheduled appointments per 2/14 los. Spoke to patient who is aware of appointments dates and times.

## 2020-03-25 ENCOUNTER — Other Ambulatory Visit: Payer: Self-pay

## 2020-03-25 ENCOUNTER — Other Ambulatory Visit: Payer: Self-pay | Admitting: *Deleted

## 2020-03-25 ENCOUNTER — Inpatient Hospital Stay: Payer: Medicare Other

## 2020-03-25 ENCOUNTER — Telehealth: Payer: Self-pay | Admitting: *Deleted

## 2020-03-25 DIAGNOSIS — C250 Malignant neoplasm of head of pancreas: Secondary | ICD-10-CM

## 2020-03-25 DIAGNOSIS — Z5111 Encounter for antineoplastic chemotherapy: Secondary | ICD-10-CM | POA: Diagnosis not present

## 2020-03-25 MED ORDER — HEPARIN SOD (PORK) LOCK FLUSH 100 UNIT/ML IV SOLN
500.0000 [IU] | INTRAVENOUS | Status: AC | PRN
  Administered 2020-03-25: 500 [IU]
  Filled 2020-03-25: qty 5

## 2020-03-25 MED ORDER — SODIUM CHLORIDE 0.9% FLUSH
10.0000 mL | INTRAVENOUS | Status: AC | PRN
  Administered 2020-03-25: 10 mL
  Filled 2020-03-25: qty 10

## 2020-03-25 MED ORDER — SODIUM CHLORIDE 0.9 % IV SOLN
10.0000 mg | Freq: Once | INTRAVENOUS | Status: AC
  Administered 2020-03-25: 10 mg via INTRAVENOUS
  Filled 2020-03-25: qty 10

## 2020-03-25 MED ORDER — SODIUM CHLORIDE 0.9 % IV SOLN
INTRAVENOUS | Status: AC
  Filled 2020-03-25 (×2): qty 250

## 2020-03-25 NOTE — Progress Notes (Signed)
Per Dr. Benay Spice: Add 10 mg IV decadron today as well with fluids.

## 2020-03-25 NOTE — Telephone Encounter (Addendum)
Called to report she thinks she does need IV fluids today w/pump d/c. Feels weak due to vomiting yesterday and still has nausea. Not getting very many po fluids in.  Per infusion charge nurse, OK to give fluids today at 2 pm. Patient notified as well as scheduler.

## 2020-03-25 NOTE — Progress Notes (Signed)
IV fluid orders placed for today.

## 2020-03-25 NOTE — Patient Instructions (Signed)
Rehydration, Adult Rehydration is the replacement of body fluids, salts, and minerals (electrolytes) that are lost during dehydration. Dehydration is when there is not enough water or other fluids in the body. This happens when you lose more fluids than you take in. Common causes of dehydration include:  Not drinking enough fluids. This can occur when you are ill or doing activities that require a lot of energy, especially in hot weather.  Conditions that cause loss of water or other fluids, such as diarrhea, vomiting, sweating, or urinating a lot.  Other illnesses, such as fever or infection.  Certain medicines, such as those that remove excess fluid from the body (diuretics). Symptoms of mild or moderate dehydration may include thirst, dry lips and mouth, and dizziness. Symptoms of severe dehydration may include increased heart rate, confusion, fainting, and not urinating. For severe dehydration, you may need to get fluids through an IV at the hospital. For mild or moderate dehydration, you can usually rehydrate at home by drinking certain fluids as told by your health care provider. What are the risks? Generally, rehydration is safe. However, taking in too much fluid (overhydration) can be a problem. This is rare. Overhydration can cause an electrolyte imbalance, kidney failure, or a decrease in salt (sodium) levels in the body. Supplies needed You will need an oral rehydration solution (ORS) if your health care provider tells you to use one. This is a drink to treat dehydration. It can be found in pharmacies and retail stores. How to rehydrate Fluids Follow instructions from your health care provider for rehydration. The kind of fluid and the amount you should drink depend on your condition. In general, you should choose drinks that you prefer.  If told by your health care provider, drink an ORS. ? Make an ORS by following instructions on the package. ? Start by drinking small amounts,  about  cup (120 mL) every 5-10 minutes. ? Slowly increase how much you drink until you have taken the amount recommended by your health care provider.  Drink enough clear fluids to keep your urine pale yellow. If you were told to drink an ORS, finish it first, then start slowly drinking other clear fluids. Drink fluids such as: ? Water. This includes sparkling water and flavored water. Drinking only water can lead to having too little sodium in your body (hyponatremia). Follow the advice of your health care provider. ? Water from ice chips you suck on. ? Fruit juice with water you add to it (diluted). ? Sports drinks. ? Hot or cold herbal teas. ? Broth-based soups. ? Milk or milk products. Food Follow instructions from your health care provider about what to eat while you rehydrate. Your health care provider may recommend that you slowly begin eating regular foods in small amounts.  Eat foods that contain a healthy balance of electrolytes, such as bananas, oranges, potatoes, tomatoes, and spinach.  Avoid foods that are greasy or contain a lot of sugar. In some cases, you may get nutrition through a feeding tube that is passed through your nose and into your stomach (nasogastric tube, or NG tube). This may be done if you have uncontrolled vomiting or diarrhea.   Beverages to avoid Certain beverages may make dehydration worse. While you rehydrate, avoid drinking alcohol.   How to tell if you are recovering from dehydration You may be recovering from dehydration if:  You are urinating more often than before you started rehydrating.  Your urine is pale yellow.  Your energy level   improves.  You vomit less frequently.  You have diarrhea less frequently.  Your appetite improves or returns to normal.  You feel less dizzy or less light-headed.  Your skin tone and color start to look more normal. Follow these instructions at home:  Take over-the-counter and prescription medicines only  as told by your health care provider.  Do not take sodium tablets. Doing this can lead to having too much sodium in your body (hypernatremia). Contact a health care provider if:  You continue to have symptoms of mild or moderate dehydration, such as: ? Thirst. ? Dry lips. ? Slightly dry mouth. ? Dizziness. ? Dark urine or less urine than normal. ? Muscle cramps.  You continue to vomit or have diarrhea. Get help right away if you:  Have symptoms of dehydration that get worse.  Have a fever.  Have a severe headache.  Have been vomiting and the following happens: ? Your vomiting gets worse or does not go away. ? Your vomit includes blood or green matter (bile). ? You cannot eat or drink without vomiting.  Have problems with urination or bowel movements, such as: ? Diarrhea that gets worse or does not go away. ? Blood in your stool (feces). This may cause stool to look black and tarry. ? Not urinating, or urinating only a small amount of very dark urine, within 6-8 hours.  Have trouble breathing.  Have symptoms that get worse with treatment. These symptoms may represent a serious problem that is an emergency. Do not wait to see if the symptoms will go away. Get medical help right away. Call your local emergency services (911 in the [place] Do not drive yourself to the hospital. Summary  Rehydration is the replacement of body fluids and minerals (electrolytes) that are lost during dehydration.  Follow instructions from your health care provider for rehydration. The kind of fluid and amount you should drink depend on your condition.  Slowly increase how much you drink until you have taken the amount recommended by your health care provider.  Contact your health care provider if you continue to show signs of mild or moderate dehydration. This information is not intended to replace advice given to you by your health care provider. Make sure you discuss any questions you have with  your health care provider. Document Revised: 03/27/2019 Document Reviewed: 02/04/2019 Elsevier Patient Education  2021 [employer].  

## 2020-03-26 ENCOUNTER — Telehealth: Payer: Self-pay | Admitting: *Deleted

## 2020-03-26 NOTE — Telephone Encounter (Signed)
Reports feeling better today: less nausea. Will try to get at least 60 oz fluid in today. Currently eating graham crackers and water. Having some small loose stools w/excess gas--encouraged her to use heating pad, Gaviscon, and ambulate to help get rid of gas. Take Imodium if she develops diarrhea. Currently the stools are very small, mostly gas. Headache is gone. She will call early in am if she needs more fluids before the weekend. Confirmed she has ondansetron at home as well, and told her she can begin taking this since it is past 72 hours from chemo.

## 2020-03-27 ENCOUNTER — Telehealth: Payer: Self-pay | Admitting: *Deleted

## 2020-03-27 MED ORDER — HYOSCYAMINE SULFATE SL 0.125 MG SL SUBL: 1.0000 | SUBLINGUAL_TABLET | Freq: Four times a day (QID) | SUBLINGUAL | 0 refills | Status: DC | PRN

## 2020-03-27 NOTE — Telephone Encounter (Signed)
Called to report she has no nausea or vomiting or diarrhea. Has been able to drink around 64 ounces last 24 hours. Asking if this is adequate or does she need IV fluids? Her endocrinologist told her to drink plenty of fluids to keep her glucose readings down. Informed this is adequate. No need for fluids now. Still having abdominal cramping. OK to order levsin per Dr. Benay Spice. She is aware.

## 2020-03-30 ENCOUNTER — Other Ambulatory Visit: Payer: Self-pay

## 2020-03-30 ENCOUNTER — Inpatient Hospital Stay: Payer: Medicare Other | Admitting: Nutrition

## 2020-03-30 NOTE — Progress Notes (Signed)
80 year old female diagnosed with pancreas cancer followed by Dr. Benay Spice.  Past medical history includes pancreatitis, diabetes, hypertension, COVID-19, hypercholesterolemia, GERD.  Medications include Pepcid, NovoLog, Ativan, multivitamin, Zofran, probiotics, Compazine, and vitamin C.  Labs include glucose 206 and albumin 2.9 on February 14. Height: 63 inches. Weight: 122.7 pounds February 14. Usual body weight: 155.4 pounds October 16. BMI: 21.74.  Patient reports increased fatigue. She reports very poor appetite in addition to taste alterations. Noted patient has some swelling around her ankles. She lives with her daughter who prepares healthy dinners. Dietary recall reveals patient consumes omelets with vegetables and avocado and 1/2 muffin for breakfast.  She eats tuna or cottage cheese with avocados for lunch.  She eats a traditional healthy dinner.  She has consumed some oral nutrition supplements.  She has a dislike for meat right now.  Nutrition diagnosis: Unintended weight loss related to pancreas cancer and associated treatments as evidenced by 21% weight loss in less than 6 months which is significant.  Intervention: Educated patient to consume smaller more frequent meals and snacks with high-calorie, high-protein foods. Reviewed strategies for glycemic control. Recommended oral nutrition supplements and provided samples. Educated patient on strategies for improving appetite and taste alterations. Provided fact sheets.  Questions were answered.  Teach back method used.  Contact information given.  Monitoring: Patient will consume adequate calories and protein to minimize weight loss.  Next visit: Monday, February 28 during infusion.  **Disclaimer: This note was dictated with voice recognition software. Similar sounding words can inadvertently be transcribed and this note may contain transcription errors which may not have been corrected upon publication of note.**

## 2020-03-31 ENCOUNTER — Telehealth: Payer: Self-pay | Admitting: *Deleted

## 2020-03-31 MED ORDER — PANCRELIPASE (LIP-PROT-AMYL) 36000-114000 UNITS PO CPEP: 36000.0000 [IU] | ORAL_CAPSULE | Freq: Three times a day (TID) | ORAL | 2 refills | Status: DC

## 2020-03-31 NOTE — Telephone Encounter (Signed)
Patient asking if ok to take the tramadol and hyoscyamine at the same time. Is still having terrible gas pains and cramping. States about 15-20 minutes after eating she gets severe gas pain and has small loose stool. Informed her that per MD: OK to take both.

## 2020-03-31 NOTE — Addendum Note (Signed)
Addended by: Tania Ade on: 03/31/2020 04:03 PM   Modules accepted: Orders

## 2020-03-31 NOTE — Telephone Encounter (Signed)
Called patient back and informed her that MD has ordered creon to take tid before meals. Explained to her how it works. She will call pharmacy tomorrow to see if they have it in stock and what her out of pocket cost is. If too expensive, call office and we will work on patient assistance.

## 2020-04-05 ENCOUNTER — Other Ambulatory Visit: Payer: Self-pay | Admitting: Oncology

## 2020-04-06 ENCOUNTER — Inpatient Hospital Stay (HOSPITAL_BASED_OUTPATIENT_CLINIC_OR_DEPARTMENT_OTHER): Payer: Medicare Other | Admitting: Nurse Practitioner

## 2020-04-06 ENCOUNTER — Inpatient Hospital Stay: Payer: Medicare Other

## 2020-04-06 ENCOUNTER — Encounter: Payer: Self-pay | Admitting: Nurse Practitioner

## 2020-04-06 ENCOUNTER — Inpatient Hospital Stay: Payer: Medicare Other | Admitting: Nutrition

## 2020-04-06 ENCOUNTER — Other Ambulatory Visit: Payer: Self-pay

## 2020-04-06 VITALS — BP 136/76 | HR 92 | Temp 97.6°F | Resp 16 | Ht 63.0 in | Wt 123.4 lb

## 2020-04-06 DIAGNOSIS — Z5111 Encounter for antineoplastic chemotherapy: Secondary | ICD-10-CM | POA: Diagnosis not present

## 2020-04-06 DIAGNOSIS — C250 Malignant neoplasm of head of pancreas: Secondary | ICD-10-CM

## 2020-04-06 DIAGNOSIS — Z95828 Presence of other vascular implants and grafts: Secondary | ICD-10-CM | POA: Insufficient documentation

## 2020-04-06 LAB — CMP (CANCER CENTER ONLY)
ALT: 29 U/L (ref 0–44)
AST: 49 U/L — ABNORMAL HIGH (ref 15–41)
Albumin: 2.8 g/dL — ABNORMAL LOW (ref 3.5–5.0)
Alkaline Phosphatase: 192 U/L — ABNORMAL HIGH (ref 38–126)
Anion gap: 6 (ref 5–15)
BUN: 16 mg/dL (ref 8–23)
CO2: 23 mmol/L (ref 22–32)
Calcium: 8.2 mg/dL — ABNORMAL LOW (ref 8.9–10.3)
Chloride: 108 mmol/L (ref 98–111)
Creatinine: 0.56 mg/dL (ref 0.44–1.00)
GFR, Estimated: >60 mL/min (ref >60)
Glucose, Bld: 182 mg/dL — ABNORMAL HIGH (ref 70–99)
Potassium: 3.4 mmol/L — ABNORMAL LOW (ref 3.5–5.1)
Sodium: 137 mmol/L (ref 135–145)
Total Bilirubin: 1.2 mg/dL (ref 0.3–1.2)
Total Protein: 5.6 g/dL — ABNORMAL LOW (ref 6.5–8.1)

## 2020-04-06 LAB — CBC WITH DIFFERENTIAL (CANCER CENTER ONLY)
Abs Immature Granulocytes: 0.03 10*3/uL (ref 0.00–0.07)
Basophils Absolute: 0 10*3/uL (ref 0.0–0.1)
Basophils Relative: 1 %
Eosinophils Absolute: 0.1 10*3/uL (ref 0.0–0.5)
Eosinophils Relative: 1 %
HCT: 30.2 % — ABNORMAL LOW (ref 36.0–46.0)
Hemoglobin: 10 g/dL — ABNORMAL LOW (ref 12.0–15.0)
Immature Granulocytes: 1 %
Lymphocytes Relative: 20 %
Lymphs Abs: 1.2 10*3/uL (ref 0.7–4.0)
MCH: 30.1 pg (ref 26.0–34.0)
MCHC: 33.1 g/dL (ref 30.0–36.0)
MCV: 91 fL (ref 80.0–100.0)
Monocytes Absolute: 0.6 10*3/uL (ref 0.1–1.0)
Monocytes Relative: 9 %
Neutro Abs: 4.4 10*3/uL (ref 1.7–7.7)
Neutrophils Relative %: 68 %
Platelet Count: 206 10*3/uL (ref 150–400)
RBC: 3.32 MIL/uL — ABNORMAL LOW (ref 3.87–5.11)
RDW: 16.1 % — ABNORMAL HIGH (ref 11.5–15.5)
WBC Count: 6.3 10*3/uL (ref 4.0–10.5)
nRBC: 0 % (ref 0.0–0.2)

## 2020-04-06 MED ORDER — DEXTROSE 5 % IV SOLN
Freq: Once | INTRAVENOUS | Status: AC
  Filled 2020-04-06: qty 250

## 2020-04-06 MED ORDER — PALONOSETRON HCL INJECTION 0.25 MG/5ML
0.2500 mg | Freq: Once | INTRAVENOUS | Status: AC
  Administered 2020-04-06: 0.25 mg via INTRAVENOUS

## 2020-04-06 MED ORDER — PALONOSETRON HCL INJECTION 0.25 MG/5ML
INTRAVENOUS | Status: AC
  Filled 2020-04-06: qty 5

## 2020-04-06 MED ORDER — SODIUM CHLORIDE 0.9 % IV SOLN
10.0000 mg | Freq: Once | INTRAVENOUS | Status: AC
  Administered 2020-04-06: 10 mg via INTRAVENOUS
  Filled 2020-04-06: qty 10

## 2020-04-06 MED ORDER — LEUCOVORIN CALCIUM INJECTION 350 MG
400.0000 mg/m2 | Freq: Once | INTRAVENOUS | Status: AC
  Administered 2020-04-06: 628 mg via INTRAVENOUS
  Filled 2020-04-06: qty 31.4

## 2020-04-06 MED ORDER — SODIUM CHLORIDE 0.9 % IV SOLN
2400.0000 mg/m2 | INTRAVENOUS | Status: DC
  Administered 2020-04-06: 3750 mg via INTRAVENOUS
  Filled 2020-04-06: qty 75

## 2020-04-06 MED ORDER — FOSAPREPITANT DIMEGLUMINE INJECTION 150 MG
150.0000 mg | Freq: Once | INTRAVENOUS | Status: AC
  Administered 2020-04-06: 150 mg via INTRAVENOUS
  Filled 2020-04-06: qty 150

## 2020-04-06 MED ORDER — SODIUM CHLORIDE 0.9% FLUSH
10.0000 mL | Freq: Once | INTRAVENOUS | Status: AC
  Administered 2020-04-06: 10 mL
  Filled 2020-04-06: qty 10

## 2020-04-06 MED ORDER — OXALIPLATIN CHEMO INJECTION 100 MG/20ML
85.0000 mg/m2 | Freq: Once | INTRAVENOUS | Status: AC
  Administered 2020-04-06: 135 mg via INTRAVENOUS
  Filled 2020-04-06: qty 27

## 2020-04-06 MED ORDER — SODIUM CHLORIDE 0.9% FLUSH
10.0000 mL | INTRAVENOUS | Status: DC | PRN
  Filled 2020-04-06: qty 10

## 2020-04-06 NOTE — Patient Instructions (Signed)
[facility] Discharge Instructions for Patients Receiving Chemotherapy  Today you received the following chemotherapy agents: Oxaliplatin, Leucovorin, and Fluorouracil  To help prevent nausea and vomiting after your treatment, we encourage you to take your nausea medication as directed by your MD.   If you develop nausea and vomiting that is not controlled by your nausea medication, call the clinic.   BELOW ARE SYMPTOMS THAT SHOULD BE REPORTED IMMEDIATELY:  *FEVER GREATER THAN 100.5 F  *CHILLS WITH OR WITHOUT FEVER  NAUSEA AND VOMITING THAT IS NOT CONTROLLED WITH YOUR NAUSEA MEDICATION  *UNUSUAL SHORTNESS OF BREATH  *UNUSUAL BRUISING OR BLEEDING  TENDERNESS IN MOUTH AND THROAT WITH OR WITHOUT PRESENCE OF ULCERS  *URINARY PROBLEMS  *BOWEL PROBLEMS  UNUSUAL RASH Items with * indicate a potential emergency and should be followed up as soon as possible.  Feel free to call the clinic should you have any questions or concerns. The clinic phone number is [phone].  Please show the CHEMO ALERT CARD at check-in to the Emergency Department and triage nurse.  [facility] Discharge Instructions for Patients receiving Home Portable Chemo Pump    **The bag should finish at 46 hours, 96 hours or 7 days. For example, if your pump is scheduled for 46 hours and it was put on at 4pm, it should finish at 2 pm the day it is scheduled to come off regardless of your appointment time.    Estimated time to finish   _________________________ (Have your nurse fill in)     ** if the display on your pump reads "Low Volume" and it is beeping, take the batteries out of the pump and come to the cancer center for it to be taken off.   **If the pump alarms go off prior to the pump reading "Low Volume" then call the [phone] and someone can assist you.  **If the plunger comes out and the bag fluid is running out, please use your chemo spill kit to clean up the  spill. Do not use paper towels or other house hold products.  ** If you have problems or questions regarding your pump, please call either the [phone] or the cancer center Monday-Friday 8:00am-4:30pm at [phone] and we will assist you.  If you are unable to get assistance then go to [facility] Emergency Room, ask the staff to contact the IV team for assistance.    

## 2020-04-06 NOTE — Progress Notes (Signed)
[place] OFFICE PROGRESS NOTE   Diagnosis: Pancreas cancer  INTERVAL HISTORY:   Nancy Hawkins returns as scheduled.  She completed cycle 1 FOLFOX 03/23/2020.  She received IV fluids and dexamethasone on the day of pump discontinuation due to nausea/vomiting.  After that she denies significant nausea/vomiting.  No mouth sores.  No diarrhea though she did develop crampy abdominal pain.  The crampy abdominal pain resolved after she began Creon.  She did not experience significant cold sensitivity.  No numbness or tingling in her hands or feet today.  She has recently noted bilateral swelling at the lower leg.  The swelling improves with elevation.  Objective:  Vital signs in last 24 hours:  Blood pressure 136/76, pulse 92, temperature 97.6 F (36.4 C), temperature source Tympanic, resp. rate 16, height 5\' 3"  (1.6 m), weight 123 lb 6.4 oz (56 kg), SpO2 100 %.    HEENT: No thrush or ulcers. Resp: Lungs clear bilaterally. Cardio: Regular rate and rhythm. GI: Abdomen soft and nontender.  No hepatomegaly. Vascular: Trace lower leg/ankle edema bilaterally.  Calves soft and nontender. Skin: Palms without erythema. Port-A-Cath without erythema.   Lab Results:  Lab Results  Component Value Date   WBC 6.3 04/06/2020   HGB 10.0 (L) 04/06/2020   HCT 30.2 (L) 04/06/2020   MCV 91.0 04/06/2020   PLT 206 04/06/2020   NEUTROABS 4.4 04/06/2020    Imaging:  No results found.  Medications: I have reviewed the patient's current medications.  Assessment/Plan: 1. Pancreas cancer, borderline resectable   CT abdomen/pelvis-inflammation involving the mid body of the pancreas with peripancreatic fat stranding; possible 2.5 x 2.2 cm low-attenuation mass within the mid body of the pancreas, appearsto abut the celiac axis as well as the splenic vein.   MRI 11/18/2019-extensive inflammatory fat stranding about the pancreas with a thinly septated multicystic fluid signal lesion of the  anterior pancreatic neck measuring 3.2 x 1.7 cm most consistent with a pancreatic pseudocyst; numerous additional small fluid signal lesions throughout the pancreatic parenchyma, the remaining lesions subcentimeter; central portion of pancreatic duct dilated measuring up to 8 mm without obvious obstructing lesion identified; trace ascites; trace bilateral pleural effusions and associated atelectasis or consolidation.  Colonoscopy 11/20/2019-5 polyps(resected and retrieved-multiple tubular adenomas, negative for high-grade dysplasia); 2 colonicangioectasias; diverticulosis in the entire examined colon; nonbleeding nonthrombosed external and internal hemorrhoids.   EGD/EUS 11/20/2019-few white nummular lesions in the entire esophagus (esophagus biopsy with no significant pathologic findings, no fungal elements); hematin found in the entire examined stomach; gastritis (stomach biopsy showed gastropathy/gastritis); 14 mm semisessile polyp found in the posterior wall of the gastric antrum (hyperplastic polyp);a lesion was identified in the pancreatic body staged T2 N0 MX.FNA of the pancreas lesion showed benign reactive/reparative changes, prominent inflammation.   EUS procedure 01/20/2020-masslike region identified in the pancreatic head, stage T2 N0 MX. 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 Pancreatitis October 2021 3. Diabetes October 2021 4. Isolated episode of atrial fibrillation approximately 15 years ago 5. Hypertension 6. COVID-19  infection 7. Admission 03/05/2020 with biliary obstruction-ERCP 03/09/2020, biliary obstruction secondary to known pancreatic head mass found in the  lower third of the main duct.  The upper third of the main bile duct and left and right hepatic ducts and all intrahepatic branches moderately dilated secondary to the stricture.  Biliary sphincterotomy performed.  Covered metal biliary stent placed into the common bile duct. 8. Port-A-Cath placement 03/11/2020, Dr. [name]   Disposition: [name] appears stable.  She has completed 1 cycle of FOLFOX.  She had delayed nausea.  She will receive Emend with cycle 2.  We discussed the potential for an allergic reaction with Emend.  She agrees to proceed.  Plan to proceed with cycle 2 today as scheduled.  We reviewed the CBC from today.  Counts adequate to proceed with treatment.  The crampy abdominal pain she was experiencing resolved with initiation of Creon.  She will continue the same.  She will return for lab, follow-up, cycle 3 FOLFOX in 2 weeks.  She will contact the office in the interim with any problems.    Ned Card ANP/GNP-BC   04/06/2020  11:25 AM

## 2020-04-06 NOTE — Progress Notes (Addendum)
Nutrition follow-up completed with patient during infusion for pancreas cancer. Weight improved and was documented as 123.4 pounds February 28 increased from 120.3 pounds February 16. Noted patient has had some nausea.  Emend was added.  She is receiving her next cycle of FOLFOX. Reports abdominal pain has resolved since beginning Creon, 36,000 units 3 times daily with meals. Taste alterations improved with nutrition strategies. Noted lower leg edema continues. Patient reports she feels better overall.  Nutrition diagnosis: Unintended weight loss improved.  Intervention: Continue smaller more frequent meals and snacks with high-calorie high-protein foods as tolerated. Continue oral nutrition supplements as tolerated. Continue strategies for improving taste alterations.  Monitoring, evaluation, goals: Patient will tolerate increased calories and protein to minimize weight loss.  Next visit: Monday, March 28 during infusion.  **Disclaimer: This note was dictated with voice recognition software. Similar sounding words can inadvertently be transcribed and this note may contain transcription errors which may not have been corrected upon publication of note.**

## 2020-04-07 ENCOUNTER — Telehealth: Payer: Self-pay | Admitting: Nurse Practitioner

## 2020-04-07 LAB — CANCER ANTIGEN 19-9: CA 19-9: 717 U/mL — ABNORMAL HIGH (ref 0–35)

## 2020-04-07 NOTE — Telephone Encounter (Signed)
Scheduled appointments per 2/28 los. Called patient, no answer. Left msg with appointments dates and times.

## 2020-04-08 ENCOUNTER — Inpatient Hospital Stay: Payer: Medicare Other | Attending: Nurse Practitioner

## 2020-04-08 ENCOUNTER — Other Ambulatory Visit: Payer: Self-pay

## 2020-04-08 VITALS — BP 138/72 | HR 78 | Temp 98.6°F | Resp 18

## 2020-04-08 DIAGNOSIS — Z79899 Other long term (current) drug therapy: Secondary | ICD-10-CM | POA: Insufficient documentation

## 2020-04-08 DIAGNOSIS — Z5189 Encounter for other specified aftercare: Secondary | ICD-10-CM | POA: Diagnosis not present

## 2020-04-08 DIAGNOSIS — D709 Neutropenia, unspecified: Secondary | ICD-10-CM | POA: Insufficient documentation

## 2020-04-08 DIAGNOSIS — E119 Type 2 diabetes mellitus without complications: Secondary | ICD-10-CM | POA: Diagnosis not present

## 2020-04-08 DIAGNOSIS — I10 Essential (primary) hypertension: Secondary | ICD-10-CM | POA: Insufficient documentation

## 2020-04-08 DIAGNOSIS — Z452 Encounter for adjustment and management of vascular access device: Secondary | ICD-10-CM | POA: Diagnosis not present

## 2020-04-08 DIAGNOSIS — Z8616 Personal history of COVID-19: Secondary | ICD-10-CM | POA: Insufficient documentation

## 2020-04-08 DIAGNOSIS — Z5111 Encounter for antineoplastic chemotherapy: Secondary | ICD-10-CM | POA: Diagnosis not present

## 2020-04-08 DIAGNOSIS — C251 Malignant neoplasm of body of pancreas: Secondary | ICD-10-CM | POA: Insufficient documentation

## 2020-04-08 DIAGNOSIS — C250 Malignant neoplasm of head of pancreas: Secondary | ICD-10-CM

## 2020-04-08 MED ORDER — HEPARIN SOD (PORK) LOCK FLUSH 100 UNIT/ML IV SOLN
500.0000 [IU] | Freq: Once | INTRAVENOUS | Status: AC | PRN
Start: 2020-04-08 — End: 2020-04-08
  Administered 2020-04-08: 500 [IU]
  Filled 2020-04-08: qty 5

## 2020-04-08 MED ORDER — SODIUM CHLORIDE 0.9% FLUSH
10.0000 mL | INTRAVENOUS | Status: DC | PRN
  Administered 2020-04-08: 10 mL
  Filled 2020-04-08: qty 10

## 2020-04-08 NOTE — Patient Instructions (Signed)

## 2020-04-13 ENCOUNTER — Telehealth: Payer: Self-pay | Admitting: *Deleted

## 2020-04-13 MED ORDER — MIRTAZAPINE 15 MG PO TBDP: 15.0000 mg | ORAL_TABLET | Freq: Every day | ORAL | 1 refills | Status: DC

## 2020-04-13 NOTE — Telephone Encounter (Signed)
Patient is asking for an antidepressant to help her feel a bit "brighter" in regards to her situation and physical weakness and not eating. Explained that an antidepressant can be of some benefit, but she will still have fatigue a few days after each treatment. She wants to get started on something now instead of waiting to discuss next week. Informed her that it can take a couple weeks for an antidepressant to begin to have an effect on her.

## 2020-04-13 NOTE — Telephone Encounter (Signed)
Notified patient that MD approved Remeron 15 mg every hs. Script has been sent.

## 2020-04-18 ENCOUNTER — Other Ambulatory Visit: Payer: Self-pay | Admitting: Oncology

## 2020-04-19 ENCOUNTER — Other Ambulatory Visit: Payer: Self-pay | Admitting: Oncology

## 2020-04-20 ENCOUNTER — Other Ambulatory Visit: Payer: Medicare Other

## 2020-04-20 ENCOUNTER — Inpatient Hospital Stay: Payer: Medicare Other

## 2020-04-20 ENCOUNTER — Encounter: Payer: Medicare Other | Admitting: Nutrition

## 2020-04-20 ENCOUNTER — Ambulatory Visit: Payer: Medicare Other | Admitting: Nurse Practitioner

## 2020-04-20 ENCOUNTER — Ambulatory Visit: Payer: Medicare Other

## 2020-04-20 ENCOUNTER — Inpatient Hospital Stay (HOSPITAL_BASED_OUTPATIENT_CLINIC_OR_DEPARTMENT_OTHER): Payer: Medicare Other | Admitting: Oncology

## 2020-04-20 ENCOUNTER — Other Ambulatory Visit: Payer: Self-pay

## 2020-04-20 VITALS — BP 137/58 | HR 87 | Temp 97.8°F | Resp 18 | Ht 63.0 in | Wt 122.2 lb

## 2020-04-20 DIAGNOSIS — C250 Malignant neoplasm of head of pancreas: Secondary | ICD-10-CM

## 2020-04-20 DIAGNOSIS — Z95828 Presence of other vascular implants and grafts: Secondary | ICD-10-CM

## 2020-04-20 DIAGNOSIS — Z5111 Encounter for antineoplastic chemotherapy: Secondary | ICD-10-CM | POA: Diagnosis not present

## 2020-04-20 LAB — CMP (CANCER CENTER ONLY)
ALT: 30 U/L (ref 0–44)
AST: 30 U/L (ref 15–41)
Albumin: 3.1 g/dL — ABNORMAL LOW (ref 3.5–5.0)
Alkaline Phosphatase: 228 U/L — ABNORMAL HIGH (ref 38–126)
Anion gap: 7 (ref 5–15)
BUN: 23 mg/dL (ref 8–23)
CO2: 22 mmol/L (ref 22–32)
Calcium: 8.9 mg/dL (ref 8.9–10.3)
Chloride: 107 mmol/L (ref 98–111)
Creatinine: 0.65 mg/dL (ref 0.44–1.00)
GFR, Estimated: >60 mL/min (ref >60)
Glucose, Bld: 188 mg/dL — ABNORMAL HIGH (ref 70–99)
Potassium: 3.8 mmol/L (ref 3.5–5.1)
Sodium: 136 mmol/L (ref 135–145)
Total Bilirubin: 0.7 mg/dL (ref 0.3–1.2)
Total Protein: 6.6 g/dL (ref 6.5–8.1)

## 2020-04-20 LAB — CBC WITH DIFFERENTIAL (CANCER CENTER ONLY)
Abs Immature Granulocytes: 0.02 10*3/uL (ref 0.00–0.07)
Basophils Absolute: 0 10*3/uL (ref 0.0–0.1)
Basophils Relative: 0 %
Eosinophils Absolute: 0.1 10*3/uL (ref 0.0–0.5)
Eosinophils Relative: 2 %
HCT: 30 % — ABNORMAL LOW (ref 36.0–46.0)
Hemoglobin: 10 g/dL — ABNORMAL LOW (ref 12.0–15.0)
Immature Granulocytes: 0 %
Lymphocytes Relative: 21 %
Lymphs Abs: 0.9 10*3/uL (ref 0.7–4.0)
MCH: 30.3 pg (ref 26.0–34.0)
MCHC: 33.3 g/dL (ref 30.0–36.0)
MCV: 90.9 fL (ref 80.0–100.0)
Monocytes Absolute: 0.7 10*3/uL (ref 0.1–1.0)
Monocytes Relative: 17 %
Neutro Abs: 2.7 10*3/uL (ref 1.7–7.7)
Neutrophils Relative %: 60 %
Platelet Count: 242 10*3/uL (ref 150–400)
RBC: 3.3 MIL/uL — ABNORMAL LOW (ref 3.87–5.11)
RDW: 16.6 % — ABNORMAL HIGH (ref 11.5–15.5)
WBC Count: 4.5 10*3/uL (ref 4.0–10.5)
nRBC: 0 % (ref 0.0–0.2)

## 2020-04-20 MED ORDER — SODIUM CHLORIDE 0.9% FLUSH
10.0000 mL | Freq: Once | INTRAVENOUS | Status: AC
  Administered 2020-04-20: 10 mL
  Filled 2020-04-20: qty 10

## 2020-04-20 MED ORDER — ALTEPLASE 2 MG IJ SOLR
INTRAMUSCULAR | Status: AC
  Filled 2020-04-20: qty 2

## 2020-04-20 MED ORDER — ATROPINE SULFATE 1 MG/ML IJ SOLN
INTRAMUSCULAR | Status: AC
  Filled 2020-04-20: qty 1

## 2020-04-20 MED ORDER — SODIUM CHLORIDE 0.9 % IV SOLN
125.0000 mg/m2 | Freq: Once | INTRAVENOUS | Status: AC
  Administered 2020-04-20: 200 mg via INTRAVENOUS
  Filled 2020-04-20: qty 10

## 2020-04-20 MED ORDER — DEXTROSE 5 % IV SOLN
Freq: Once | INTRAVENOUS | Status: AC
  Filled 2020-04-20: qty 250

## 2020-04-20 MED ORDER — PALONOSETRON HCL INJECTION 0.25 MG/5ML
INTRAVENOUS | Status: AC
  Filled 2020-04-20: qty 5

## 2020-04-20 MED ORDER — ATROPINE SULFATE 1 MG/ML IJ SOLN
0.5000 mg | Freq: Once | INTRAMUSCULAR | Status: AC | PRN
  Administered 2020-04-20: 0.5 mg via INTRAVENOUS

## 2020-04-20 MED ORDER — SODIUM CHLORIDE 0.9 % IV SOLN
400.0000 mg/m2 | Freq: Once | INTRAVENOUS | Status: AC
  Administered 2020-04-20: 628 mg via INTRAVENOUS
  Filled 2020-04-20: qty 31.4

## 2020-04-20 MED ORDER — SODIUM CHLORIDE 0.9 % IV SOLN
150.0000 mg | Freq: Once | INTRAVENOUS | Status: AC
  Administered 2020-04-20: 150 mg via INTRAVENOUS
  Filled 2020-04-20: qty 150

## 2020-04-20 MED ORDER — SODIUM CHLORIDE 0.9 % IV SOLN
10.0000 mg | Freq: Once | INTRAVENOUS | Status: AC
  Administered 2020-04-20: 10 mg via INTRAVENOUS
  Filled 2020-04-20: qty 10

## 2020-04-20 MED ORDER — SODIUM CHLORIDE 0.9 % IV SOLN
2400.0000 mg/m2 | INTRAVENOUS | Status: DC
  Administered 2020-04-20: 3750 mg via INTRAVENOUS
  Filled 2020-04-20: qty 75

## 2020-04-20 MED ORDER — OXALIPLATIN CHEMO INJECTION 100 MG/20ML
85.0000 mg/m2 | Freq: Once | INTRAVENOUS | Status: AC
  Administered 2020-04-20: 135 mg via INTRAVENOUS
  Filled 2020-04-20: qty 20

## 2020-04-20 MED ORDER — PALONOSETRON HCL INJECTION 0.25 MG/5ML
0.2500 mg | Freq: Once | INTRAVENOUS | Status: AC
  Administered 2020-04-20: 0.25 mg via INTRAVENOUS

## 2020-04-20 MED ORDER — ALTEPLASE 2 MG IJ SOLR
2.0000 mg | Freq: Once | INTRAMUSCULAR | Status: AC
  Administered 2020-04-20: 2 mg
  Filled 2020-04-20: qty 2

## 2020-04-20 NOTE — Patient Instructions (Signed)
[facility] Discharge Instructions for Patients Receiving Chemotherapy  Today you received the following chemotherapy agents Oxaliplatin (ELOXATIN), Leucovorin, Irinotecan (CAMPTOSAR) & Flourouracil (ADRUCIL).  To help prevent nausea and vomiting after your treatment, we encourage you to take your nausea medication as prescribed.   If you develop nausea and vomiting that is not controlled by your nausea medication, call the clinic.   BELOW ARE SYMPTOMS THAT SHOULD BE REPORTED IMMEDIATELY:  *FEVER GREATER THAN 100.5 F  *CHILLS WITH OR WITHOUT FEVER  NAUSEA AND VOMITING THAT IS NOT CONTROLLED WITH YOUR NAUSEA MEDICATION  *UNUSUAL SHORTNESS OF BREATH  *UNUSUAL BRUISING OR BLEEDING  TENDERNESS IN MOUTH AND THROAT WITH OR WITHOUT PRESENCE OF ULCERS  *URINARY PROBLEMS  *BOWEL PROBLEMS  UNUSUAL RASH Items with * indicate a potential emergency and should be followed up as soon as possible.  Feel free to call the clinic should you have any questions or concerns. The clinic phone number is [phone].  Please show the [place] at check-in to the Emergency Department and triage nurse.

## 2020-04-20 NOTE — Progress Notes (Signed)
[place] OFFICE PROGRESS NOTE   Diagnosis: Pancreas cancer  INTERVAL HISTORY:   Ms. Market completed another cycle of FOLFOX on 04/06/2020.  She reports nausea on day 3.  The nausea had resolved by day 4.  No neuropathy symptoms.  No mouth sores.  No diarrhea.  She started Remeron last week.  She has not noted improvement in depression symptoms.  She is sleeping well.  The back pain has improved since beginning chemotherapy. Cramping abdominal discomfort improved when she began Creon. Objective:  Vital signs in last 24 hours:  Blood pressure (!) 137/58, pulse 87, temperature 97.8 F (36.6 C), temperature source Tympanic, resp. rate 18, height 5\' 3"  (1.6 m), weight 122 lb 3.2 oz (55.4 kg), SpO2 100 %.    HEENT: No thrush or ulcers Resp: Lungs clear bilaterally Cardio: Regular rate and rhythm GI: Soft, no mass, nontender, no hepatosplenomegaly Vascular: No leg edema    Portacath/PICC-without erythema  Lab Results:  Lab Results  Component Value Date   WBC 4.5 04/20/2020   HGB 10.0 (L) 04/20/2020   HCT 30.0 (L) 04/20/2020   MCV 90.9 04/20/2020   PLT 242 04/20/2020   NEUTROABS 2.7 04/20/2020    CMP  Lab Results  Component Value Date   NA 136 04/20/2020   K 3.8 04/20/2020   CL 107 04/20/2020   CO2 22 04/20/2020   GLUCOSE 188 (H) 04/20/2020   BUN 23 04/20/2020   CREATININE 0.65 04/20/2020   CALCIUM 8.9 04/20/2020   PROT 6.6 04/20/2020   ALBUMIN 3.1 (L) 04/20/2020   AST 30 04/20/2020   ALT 30 04/20/2020   ALKPHOS 228 (H) 04/20/2020   BILITOT 0.7 04/20/2020   GFRNONAA >60 04/20/2020   GFRAA 109 03/03/2020     Medications: I have reviewed the patient's current medications.   Assessment/Plan: 1. Pancreas cancer, borderline resectable   CT abdomen/pelvis-inflammation involving the mid body of the pancreas with peripancreatic fat stranding; possible 2.5 x 2.2 cm low-attenuation mass within the mid body of the pancreas, appearsto abut the celiac  axis as well as the splenic vein.   MRI 11/18/2019-extensive inflammatory fat stranding about the pancreas with a thinly septated multicystic fluid signal lesion of the anterior pancreatic neck measuring 3.2 x 1.7 cm most consistent with a pancreatic pseudocyst; numerous additional small fluid signal lesions throughout the pancreatic parenchyma, the remaining lesions subcentimeter; central portion of pancreatic duct dilated measuring up to 8 mm without obvious obstructing lesion identified; trace ascites; trace bilateral pleural effusions and associated atelectasis or consolidation.  Colonoscopy 11/20/2019-5 polyps(resected and retrieved-multiple tubular adenomas, negative for high-grade dysplasia); 2 colonicangioectasias; diverticulosis in the entire examined colon; nonbleeding nonthrombosed external and internal hemorrhoids.   EGD/EUS 11/20/2019-few white nummular lesions in the entire esophagus (esophagus biopsy with no significant pathologic findings, no fungal elements); hematin found in the entire examined stomach; gastritis (stomach biopsy showed gastropathy/gastritis); 14 mm semisessile polyp found in the posterior wall of the gastric antrum (hyperplastic polyp);a lesion was identified in the pancreatic body staged T2 N0 MX.FNA of the pancreas lesion showed benign reactive/reparative changes, prominent inflammation.   EUS procedure 01/20/2020-masslike region identified in the pancreatic head, stage T2 N0 MX. 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 Pancreatitis October 2021 3. Diabetes October 2021 4. Isolated episode of atrial fibrillation approximately 15 years ago 5. Hypertension 6. COVID-19 infection 7. Admission 03/05/2020 with biliary obstruction-ERCP 03/09/2020, biliary obstruction secondary to known pancreatic head mass found in the lower third of the main duct.  The upper third of the main bile duct and left and right hepatic ducts and all intrahepatic branches moderately dilated secondary to the stricture.  Biliary sphincterotomy performed.  Covered metal biliary stent placed into the common bile duct. 8. Port-A-Cath placement 03/11/2020, Dr. [name]    Disposition: Ms. Habermehl has completed 2 cycles of FOLFOX.  She tolerated cycle 2 well.  The plan is to add irinotecan today.  We reviewed potential toxicities associated with irinotecan including the chance of nausea, diarrhea, and alopecia.  She agrees to proceed.  Ms. Bambach will complete cycle 3 systemic therapy today.  She will return for an office visit and chemotherapy in 2 weeks.  [name], MD  04/20/2020  8:47 AM

## 2020-04-21 ENCOUNTER — Telehealth: Payer: Self-pay | Admitting: Oncology

## 2020-04-21 NOTE — Telephone Encounter (Signed)
Scheduled appt per 3/14 los - pt to get an updated schedule next visit.

## 2020-04-22 ENCOUNTER — Inpatient Hospital Stay: Payer: Medicare Other

## 2020-04-22 ENCOUNTER — Other Ambulatory Visit: Payer: Self-pay

## 2020-04-22 VITALS — BP 146/68 | HR 80

## 2020-04-22 DIAGNOSIS — Z5111 Encounter for antineoplastic chemotherapy: Secondary | ICD-10-CM | POA: Diagnosis not present

## 2020-04-22 DIAGNOSIS — C250 Malignant neoplasm of head of pancreas: Secondary | ICD-10-CM

## 2020-04-22 MED ORDER — SODIUM CHLORIDE 0.9% FLUSH
10.0000 mL | INTRAVENOUS | Status: DC | PRN
  Administered 2020-04-22: 10 mL
  Filled 2020-04-22: qty 10

## 2020-04-22 MED ORDER — HEPARIN SOD (PORK) LOCK FLUSH 100 UNIT/ML IV SOLN
500.0000 [IU] | Freq: Once | INTRAVENOUS | Status: AC | PRN
  Administered 2020-04-22: 500 [IU]
  Filled 2020-04-22: qty 5

## 2020-04-28 ENCOUNTER — Other Ambulatory Visit: Payer: Self-pay | Admitting: Oncology

## 2020-05-03 ENCOUNTER — Other Ambulatory Visit: Payer: Self-pay | Admitting: Oncology

## 2020-05-04 ENCOUNTER — Inpatient Hospital Stay: Payer: Medicare Other | Admitting: Nutrition

## 2020-05-04 ENCOUNTER — Other Ambulatory Visit: Payer: Self-pay

## 2020-05-04 ENCOUNTER — Inpatient Hospital Stay: Payer: Medicare Other

## 2020-05-04 ENCOUNTER — Inpatient Hospital Stay (HOSPITAL_BASED_OUTPATIENT_CLINIC_OR_DEPARTMENT_OTHER): Payer: Medicare Other | Admitting: Nurse Practitioner

## 2020-05-04 ENCOUNTER — Encounter: Payer: Self-pay | Admitting: Nurse Practitioner

## 2020-05-04 VITALS — BP 153/71 | HR 81 | Temp 97.6°F | Resp 13 | Ht 63.0 in | Wt 124.5 lb

## 2020-05-04 DIAGNOSIS — Z95828 Presence of other vascular implants and grafts: Secondary | ICD-10-CM

## 2020-05-04 DIAGNOSIS — C250 Malignant neoplasm of head of pancreas: Secondary | ICD-10-CM

## 2020-05-04 DIAGNOSIS — Z5111 Encounter for antineoplastic chemotherapy: Secondary | ICD-10-CM | POA: Diagnosis not present

## 2020-05-04 LAB — CMP (CANCER CENTER ONLY)
ALT: 19 U/L (ref 0–44)
AST: 14 U/L — ABNORMAL LOW (ref 15–41)
Albumin: 3.1 g/dL — ABNORMAL LOW (ref 3.5–5.0)
Alkaline Phosphatase: 123 U/L (ref 38–126)
Anion gap: 10 (ref 5–15)
BUN: 18 mg/dL (ref 8–23)
CO2: 21 mmol/L — ABNORMAL LOW (ref 22–32)
Calcium: 8.2 mg/dL — ABNORMAL LOW (ref 8.9–10.3)
Chloride: 112 mmol/L — ABNORMAL HIGH (ref 98–111)
Creatinine: 0.52 mg/dL (ref 0.44–1.00)
GFR, Estimated: >60 mL/min (ref >60)
Glucose, Bld: 136 mg/dL — ABNORMAL HIGH (ref 70–99)
Potassium: 2.7 mmol/L — CL (ref 3.5–5.1)
Sodium: 143 mmol/L (ref 135–145)
Total Bilirubin: 0.6 mg/dL (ref 0.3–1.2)
Total Protein: 6.2 g/dL — ABNORMAL LOW (ref 6.5–8.1)

## 2020-05-04 LAB — CBC WITH DIFFERENTIAL (CANCER CENTER ONLY)
Abs Immature Granulocytes: 0.01 10*3/uL (ref 0.00–0.07)
Basophils Absolute: 0 10*3/uL (ref 0.0–0.1)
Basophils Relative: 1 %
Eosinophils Absolute: 0.1 10*3/uL (ref 0.0–0.5)
Eosinophils Relative: 5 %
HCT: 27.2 % — ABNORMAL LOW (ref 36.0–46.0)
Hemoglobin: 8.9 g/dL — ABNORMAL LOW (ref 12.0–15.0)
Immature Granulocytes: 0 %
Lymphocytes Relative: 34 %
Lymphs Abs: 1 10*3/uL (ref 0.7–4.0)
MCH: 29.9 pg (ref 26.0–34.0)
MCHC: 32.7 g/dL (ref 30.0–36.0)
MCV: 91.3 fL (ref 80.0–100.0)
Monocytes Absolute: 0.5 10*3/uL (ref 0.1–1.0)
Monocytes Relative: 18 %
Neutro Abs: 1.2 10*3/uL — ABNORMAL LOW (ref 1.7–7.7)
Neutrophils Relative %: 42 %
Platelet Count: 139 10*3/uL — ABNORMAL LOW (ref 150–400)
RBC: 2.98 MIL/uL — ABNORMAL LOW (ref 3.87–5.11)
RDW: 15.9 % — ABNORMAL HIGH (ref 11.5–15.5)
WBC Count: 2.8 10*3/uL — ABNORMAL LOW (ref 4.0–10.5)
nRBC: 0 % (ref 0.0–0.2)

## 2020-05-04 LAB — MAGNESIUM: Magnesium: 1.7 mg/dL (ref 1.7–2.4)

## 2020-05-04 MED ORDER — SODIUM CHLORIDE 0.9 % IV SOLN
150.0000 mg | Freq: Once | INTRAVENOUS | Status: AC
  Administered 2020-05-04: 150 mg via INTRAVENOUS
  Filled 2020-05-04: qty 5

## 2020-05-04 MED ORDER — PALONOSETRON HCL INJECTION 0.25 MG/5ML
0.2500 mg | Freq: Once | INTRAVENOUS | Status: AC
  Administered 2020-05-04: 0.25 mg via INTRAVENOUS

## 2020-05-04 MED ORDER — POTASSIUM CHLORIDE CRYS ER 20 MEQ PO TBCR: EXTENDED_RELEASE_TABLET | ORAL | 1 refills | Status: DC

## 2020-05-04 MED ORDER — ATROPINE SULFATE 1 MG/ML IJ SOLN
INTRAMUSCULAR | Status: AC
  Filled 2020-05-04: qty 1

## 2020-05-04 MED ORDER — SODIUM CHLORIDE 0.9 % IV SOLN
125.0000 mg/m2 | Freq: Once | INTRAVENOUS | Status: AC
  Administered 2020-05-04: 200 mg via INTRAVENOUS
  Filled 2020-05-04: qty 10

## 2020-05-04 MED ORDER — DEXTROSE 5 % IV SOLN
Freq: Once | INTRAVENOUS | Status: AC
  Filled 2020-05-04: qty 250

## 2020-05-04 MED ORDER — ATROPINE SULFATE 1 MG/ML IJ SOLN
0.5000 mg | Freq: Once | INTRAMUSCULAR | Status: AC | PRN
  Administered 2020-05-04: 0.5 mg via INTRAVENOUS

## 2020-05-04 MED ORDER — SODIUM CHLORIDE 0.9% FLUSH
10.0000 mL | INTRAVENOUS | Status: DC | PRN
  Filled 2020-05-04: qty 10

## 2020-05-04 MED ORDER — SODIUM CHLORIDE 0.9 % IV SOLN
400.0000 mg/m2 | Freq: Once | INTRAVENOUS | Status: AC
  Administered 2020-05-04: 628 mg via INTRAVENOUS
  Filled 2020-05-04: qty 31.4

## 2020-05-04 MED ORDER — SODIUM CHLORIDE 0.9 % IV SOLN
2400.0000 mg/m2 | INTRAVENOUS | Status: DC
  Administered 2020-05-04: 3750 mg via INTRAVENOUS
  Filled 2020-05-04: qty 75

## 2020-05-04 MED ORDER — OXALIPLATIN CHEMO INJECTION 100 MG/20ML
85.0000 mg/m2 | Freq: Once | INTRAVENOUS | Status: AC
  Administered 2020-05-04: 135 mg via INTRAVENOUS
  Filled 2020-05-04: qty 20

## 2020-05-04 MED ORDER — DEXAMETHASONE SODIUM PHOSPHATE 100 MG/10ML IJ SOLN
10.0000 mg | Freq: Once | INTRAMUSCULAR | Status: AC
  Administered 2020-05-04: 10 mg via INTRAVENOUS
  Filled 2020-05-04: qty 1

## 2020-05-04 MED ORDER — PALONOSETRON HCL INJECTION 0.25 MG/5ML
INTRAVENOUS | Status: AC
  Filled 2020-05-04: qty 5

## 2020-05-04 MED ORDER — SODIUM CHLORIDE 0.9% FLUSH
10.0000 mL | Freq: Once | INTRAVENOUS | Status: AC
  Administered 2020-05-04: 10 mL
  Filled 2020-05-04: qty 10

## 2020-05-04 NOTE — Progress Notes (Signed)
MD states okay to treat with K_2.7 and ANC 1.2

## 2020-05-04 NOTE — Patient Instructions (Addendum)
[place] [facility] Discharge Instructions for Patients Receiving Chemotherapy  Today you received the following chemotherapy agents Oxaliplatin, Leukovorin, Iriniotecan, Florouricil.  To help prevent nausea and vomiting after your treatment, we encourage you to take your nausea medication as directed.   If you develop nausea and vomiting that is not controlled by your nausea medication, call the clinic.   BELOW ARE SYMPTOMS THAT SHOULD BE REPORTED IMMEDIATELY:  *FEVER GREATER THAN 100.5 F  *CHILLS WITH OR WITHOUT FEVER  NAUSEA AND VOMITING THAT IS NOT CONTROLLED WITH YOUR NAUSEA MEDICATION  *UNUSUAL SHORTNESS OF BREATH  *UNUSUAL BRUISING OR BLEEDING  TENDERNESS IN MOUTH AND THROAT WITH OR WITHOUT PRESENCE OF ULCERS  *URINARY PROBLEMS  *BOWEL PROBLEMS  UNUSUAL RASH Items with * indicate a potential emergency and should be followed up as soon as possible.  Feel free to call the clinic should you have any questions or concerns. The clinic phone number is [phone].  Please show the [place] at check-in to the Emergency Department and triage nurse.

## 2020-05-04 NOTE — Progress Notes (Signed)
Nutrition follow-up completed with patient during infusion for pancreas cancer. Weight stable and documented as 124.5 pounds on March 28.   Noted labs: Potassium 2.7, glucose 136, and albumin 3.1. Patient is receiving cycle 4 of FOLFIRINOX today.  She reports she felt better this cycle.  She had nausea one time.  She is denying mouth sores and diarrhea. Reports she has been able to integrate strategies for improved taste alterations using methods we discussed at last visit.  Lower leg edema seems improved.  Patient reports she continues to try to be somewhat active and eat well.  She has no new concerns.  Nutrition diagnosis: Unintended weight loss is stable.  Intervention: Continue strategies for adequate calorie and protein intake.  Encouraged weight maintenance.  Monitoring, evaluation, goals: Patient will tolerate adequate calories and protein for weight maintenance.  Next visit: To be scheduled as needed.  **Disclaimer: This note was dictated with voice recognition software. Similar sounding words can inadvertently be transcribed and this note may contain transcription errors which may not have been corrected upon publication of note.**

## 2020-05-04 NOTE — Progress Notes (Unsigned)
CRITICAL VALUE STICKER  CRITICAL VALUE: K 2.7  RECEIVER (on-site recipient of call):Beth W [place] NOTIFIED: 05/04/20 11:14  MD NOTIFIED: [place]: 11:18

## 2020-05-04 NOTE — Patient Instructions (Signed)

## 2020-05-04 NOTE — Progress Notes (Signed)
[place] OFFICE PROGRESS NOTE   Diagnosis: Pancreas cancer  INTERVAL HISTORY:   Nancy Hawkins returns as scheduled.  She completed a cycle of FOLFIRINOX 04/20/2020.  She had a single episode of nausea.  No mouth sores.  No diarrhea.  Mild intermittent cold sensitivity.  No persistent neuropathy symptoms.  Objective:  Vital signs in last 24 hours:  Blood pressure (!) 153/71, pulse 81, temperature 97.6 F (36.4 C), temperature source Tympanic, resp. rate 13, height 5\' 3"  (1.6 m), weight 124 lb 8 oz (56.5 kg), SpO2 100 %.    HEENT: No thrush or ulcers. Resp: Lungs clear bilaterally. Cardio: Regular rate and rhythm. GI: Abdomen soft and nontender.  No hepatomegaly. Vascular: No leg edema. Neuro: Vibratory sense intact over the fingertips per tuning fork exam. Skin: Palms without erythema. Port-A-Cath without erythema.   Lab Results:  Lab Results  Component Value Date   WBC 2.8 (L) 05/04/2020   HGB 8.9 (L) 05/04/2020   HCT 27.2 (L) 05/04/2020   MCV 91.3 05/04/2020   PLT 139 (L) 05/04/2020   NEUTROABS 1.2 (L) 05/04/2020    Imaging:  No results found.  Medications: I have reviewed the patient's current medications.  Assessment/Plan: 1. Pancreas cancer, borderline resectable   CT abdomen/pelvis-inflammation involving the mid body of the pancreas with peripancreatic fat stranding; possible 2.5 x 2.2 cm low-attenuation mass within the mid body of the pancreas, appearsto abut the celiac axis as well as the splenic vein.   MRI 11/18/2019-extensive inflammatory fat stranding about the pancreas with a thinly septated multicystic fluid signal lesion of the anterior pancreatic neck measuring 3.2 x 1.7 cm most consistent with a pancreatic pseudocyst; numerous additional small fluid signal lesions throughout the pancreatic parenchyma, the remaining lesions subcentimeter; central portion of pancreatic duct dilated measuring up to 8 mm without obvious obstructing  lesion identified; trace ascites; trace bilateral pleural effusions and associated atelectasis or consolidation.  Colonoscopy 11/20/2019-5 polyps(resected and retrieved-multiple tubular adenomas, negative for high-grade dysplasia); 2 colonicangioectasias; diverticulosis in the entire examined colon; nonbleeding nonthrombosed external and internal hemorrhoids.   EGD/EUS 11/20/2019-few white nummular lesions in the entire esophagus (esophagus biopsy with no significant pathologic findings, no fungal elements); hematin found in the entire examined stomach; gastritis (stomach biopsy showed gastropathy/gastritis); 14 mm semisessile polyp found in the posterior wall of the gastric antrum (hyperplastic polyp);a lesion was identified in the pancreatic body staged T2 N0 MX.FNA of the pancreas lesion showed benign reactive/reparative changes, prominent inflammation.   EUS procedure 01/20/2020-masslike region identified in the pancreatic head, stage T2 N0 MX. 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 Pancreatitis October 2021 3. Diabetes October 2021 4. Isolated episode of atrial fibrillation approximately 15 years ago 5. Hypertension 6. COVID-19 infection 7. Admission 03/05/2020 with biliary obstruction-ERCP 03/09/2020,biliary obstruction secondary to known pancreatic head mass found in the lower third of the main duct. The upper third of the main bile duct and left and right  hepatic ducts and all intrahepatic branches moderately dilated secondary to the stricture. Biliary sphincterotomy performed. Covered metal biliary stent placed into  the common bile duct. 8. Port-A-Cath placement 03/11/2020, Dr. [name]    Disposition: [name] appears stable. She has completed 3 cycles of systemic therapy, 2 FOLFOX and 1 FOLFIRINOX.  She tolerated the FOLFIRINOX well.  Review of the CBC from today shows mild neutropenia.  We discussed white cell growth factor support on the day of pump discontinuation.  We reviewed potential toxicities including bone pain, rash, splenic rupture.  She agrees to proceed.  Plan to proceed with cycle 4 FOLFIRINOX today as scheduled, Udenyca on day of pump discontinuation.  Review of the chemistry panel shows hypokalemia.  She is not having diarrhea.  We are adding a magnesium level  to today's labs.  She will begin K-Dur 20 milliequivalents twice daily for 3 days then 20 mEq daily.  She will return for lab, follow-up and chemotherapy in 2 weeks. She understands to contact the office with fever, chills, other signs of infection.   Plan reviewed with Dr. Benay Spice.     Ned Card ANP/GNP-BC   05/04/2020  11:08 AM

## 2020-05-06 ENCOUNTER — Inpatient Hospital Stay: Payer: Medicare Other

## 2020-05-06 ENCOUNTER — Other Ambulatory Visit: Payer: Self-pay | Admitting: Oncology

## 2020-05-06 ENCOUNTER — Other Ambulatory Visit: Payer: Self-pay

## 2020-05-06 VITALS — BP 124/68 | HR 88 | Temp 98.4°F | Resp 18

## 2020-05-06 DIAGNOSIS — C250 Malignant neoplasm of head of pancreas: Secondary | ICD-10-CM

## 2020-05-06 DIAGNOSIS — Z5111 Encounter for antineoplastic chemotherapy: Secondary | ICD-10-CM | POA: Diagnosis not present

## 2020-05-06 MED ORDER — SODIUM CHLORIDE 0.9% FLUSH
10.0000 mL | INTRAVENOUS | Status: DC | PRN
  Administered 2020-05-06: 10 mL
  Filled 2020-05-06: qty 10

## 2020-05-06 MED ORDER — HEPARIN SOD (PORK) LOCK FLUSH 100 UNIT/ML IV SOLN
500.0000 [IU] | Freq: Once | INTRAVENOUS | Status: AC | PRN
Start: 2020-05-06 — End: 2020-05-06
  Administered 2020-05-06: 500 [IU]
  Filled 2020-05-06: qty 5

## 2020-05-06 MED ORDER — PEGFILGRASTIM-CBQV 6 MG/0.6ML ~~LOC~~ SOSY
PREFILLED_SYRINGE | SUBCUTANEOUS | Status: AC
  Filled 2020-05-06: qty 0.6

## 2020-05-06 MED ORDER — PEGFILGRASTIM-CBQV 6 MG/0.6ML ~~LOC~~ SOSY
6.0000 mg | PREFILLED_SYRINGE | Freq: Once | SUBCUTANEOUS | Status: AC
  Administered 2020-05-06: 6 mg via SUBCUTANEOUS

## 2020-05-06 NOTE — Patient Instructions (Signed)
Pegfilgrastim injection What is this medicine? PEGFILGRASTIM (PEG fil gra stim) is a long-acting granulocyte colony-stimulating factor that stimulates the growth of neutrophils, a type of white blood cell important in the body's fight against infection. It is used to reduce the incidence of fever and infection in patients with certain types of cancer who are receiving chemotherapy that affects the bone marrow, and to increase survival after being exposed to high doses of radiation. This medicine may be used for other purposes; ask your health care provider or pharmacist if you have questions. COMMON BRAND NAME(S): Fulphila, Neulasta, Nyvepria, UDENYCA, Ziextenzo What should I tell my health care provider before I take this medicine? They need to know if you have any of these conditions:  kidney disease  latex allergy  ongoing radiation therapy  sickle cell disease  skin reactions to acrylic adhesives (On-Body Injector only)  an unusual or allergic reaction to pegfilgrastim, filgrastim, other medicines, foods, dyes, or preservatives  pregnant or trying to get pregnant  breast-feeding How should I use this medicine? This medicine is for injection under the skin. If you get this medicine at home, you will be taught how to prepare and give the pre-filled syringe or how to use the On-body Injector. Refer to the patient Instructions for Use for detailed instructions. Use exactly as directed. Tell your healthcare provider immediately if you suspect that the On-body Injector may not have performed as intended or if you suspect the use of the On-body Injector resulted in a missed or partial dose. It is important that you put your used needles and syringes in a special sharps container. Do not put them in a trash can. If you do not have a sharps container, call your pharmacist or healthcare provider to get one. Talk to your pediatrician regarding the use of this medicine in children. While this drug  may be prescribed for selected conditions, precautions do apply. Overdosage: If you think you have taken too much of this medicine contact a poison control center or emergency room at once. NOTE: This medicine is only for you. Do not share this medicine with others. What if I miss a dose? It is important not to miss your dose. Call your doctor or health care professional if you miss your dose. If you miss a dose due to an On-body Injector failure or leakage, a new dose should be administered as soon as possible using a single prefilled syringe for manual use. What may interact with this medicine? Interactions have not been studied. This list may not describe all possible interactions. Give your health care provider a list of all the medicines, herbs, non-prescription drugs, or dietary supplements you use. Also tell them if you smoke, drink alcohol, or use illegal drugs. Some items may interact with your medicine. What should I watch for while using this medicine? Your condition will be monitored carefully while you are receiving this medicine. You may need blood work done while you are taking this medicine. Talk to your health care provider about your risk of cancer. You may be more at risk for certain types of cancer if you take this medicine. If you are going to need a MRI, CT scan, or other procedure, tell your doctor that you are using this medicine (On-Body Injector only). What side effects may I notice from receiving this medicine? Side effects that you should report to your doctor or health care professional as soon as possible:  allergic reactions (skin rash, itching or hives, swelling of   the face, lips, or tongue)  back pain  dizziness  fever  pain, redness, or irritation at site where injected  pinpoint red spots on the skin  red or dark-brown urine  shortness of breath or breathing problems  stomach or side pain, or pain at the shoulder  swelling  tiredness  trouble  passing urine or change in the amount of urine  unusual bruising or bleeding Side effects that usually do not require medical attention (report to your doctor or health care professional if they continue or are bothersome):  bone pain  muscle pain This list may not describe all possible side effects. Call your doctor for medical advice about side effects. You may report side effects to FDA at 1-800-FDA-1088. Where should I keep my medicine? Keep out of the reach of children. If you are using this medicine at home, you will be instructed on how to store it. Throw away any unused medicine after the expiration date on the label. NOTE: This sheet is a summary. It may not cover all possible information. If you have questions about this medicine, talk to your doctor, pharmacist, or health care provider.  2021 Elsevier/Gold Standard (2019-02-15 13:20:51)  

## 2020-05-17 ENCOUNTER — Other Ambulatory Visit: Payer: Self-pay | Admitting: Oncology

## 2020-05-18 ENCOUNTER — Inpatient Hospital Stay: Payer: Medicare Other

## 2020-05-18 ENCOUNTER — Telehealth: Payer: Self-pay | Admitting: *Deleted

## 2020-05-18 ENCOUNTER — Other Ambulatory Visit: Payer: Self-pay

## 2020-05-18 ENCOUNTER — Telehealth: Payer: Self-pay | Admitting: Oncology

## 2020-05-18 ENCOUNTER — Encounter: Payer: Self-pay | Admitting: Nurse Practitioner

## 2020-05-18 ENCOUNTER — Inpatient Hospital Stay: Payer: Medicare Other | Attending: Nurse Practitioner

## 2020-05-18 ENCOUNTER — Inpatient Hospital Stay (HOSPITAL_BASED_OUTPATIENT_CLINIC_OR_DEPARTMENT_OTHER): Payer: Medicare Other | Admitting: Nurse Practitioner

## 2020-05-18 VITALS — BP 128/51 | HR 59 | Temp 97.9°F | Resp 20

## 2020-05-18 VITALS — BP 146/67 | HR 73 | Temp 98.0°F | Resp 20 | Ht 63.0 in | Wt 121.8 lb

## 2020-05-18 DIAGNOSIS — K831 Obstruction of bile duct: Secondary | ICD-10-CM | POA: Insufficient documentation

## 2020-05-18 DIAGNOSIS — T451X5A Adverse effect of antineoplastic and immunosuppressive drugs, initial encounter: Secondary | ICD-10-CM | POA: Insufficient documentation

## 2020-05-18 DIAGNOSIS — C250 Malignant neoplasm of head of pancreas: Secondary | ICD-10-CM

## 2020-05-18 DIAGNOSIS — E119 Type 2 diabetes mellitus without complications: Secondary | ICD-10-CM | POA: Diagnosis not present

## 2020-05-18 DIAGNOSIS — Z5189 Encounter for other specified aftercare: Secondary | ICD-10-CM | POA: Insufficient documentation

## 2020-05-18 DIAGNOSIS — I4891 Unspecified atrial fibrillation: Secondary | ICD-10-CM | POA: Diagnosis not present

## 2020-05-18 DIAGNOSIS — G620 Drug-induced polyneuropathy: Secondary | ICD-10-CM | POA: Diagnosis not present

## 2020-05-18 DIAGNOSIS — C251 Malignant neoplasm of body of pancreas: Secondary | ICD-10-CM | POA: Diagnosis present

## 2020-05-18 DIAGNOSIS — Z79899 Other long term (current) drug therapy: Secondary | ICD-10-CM | POA: Insufficient documentation

## 2020-05-18 DIAGNOSIS — I10 Essential (primary) hypertension: Secondary | ICD-10-CM | POA: Diagnosis not present

## 2020-05-18 DIAGNOSIS — Z5111 Encounter for antineoplastic chemotherapy: Secondary | ICD-10-CM | POA: Insufficient documentation

## 2020-05-18 DIAGNOSIS — Z8616 Personal history of COVID-19: Secondary | ICD-10-CM | POA: Insufficient documentation

## 2020-05-18 LAB — CBC WITH DIFFERENTIAL (CANCER CENTER ONLY)
Abs Immature Granulocytes: 0.61 10*3/uL — ABNORMAL HIGH (ref 0.00–0.07)
Basophils Absolute: 0.1 10*3/uL (ref 0.0–0.1)
Basophils Relative: 1 %
Eosinophils Absolute: 0.1 10*3/uL (ref 0.0–0.5)
Eosinophils Relative: 1 %
HCT: 29.7 % — ABNORMAL LOW (ref 36.0–46.0)
Hemoglobin: 9.5 g/dL — ABNORMAL LOW (ref 12.0–15.0)
Immature Granulocytes: 5 %
Lymphocytes Relative: 11 %
Lymphs Abs: 1.4 10*3/uL (ref 0.7–4.0)
MCH: 29.9 pg (ref 26.0–34.0)
MCHC: 32 g/dL (ref 30.0–36.0)
MCV: 93.4 fL (ref 80.0–100.0)
Monocytes Absolute: 0.8 10*3/uL (ref 0.1–1.0)
Monocytes Relative: 6 %
Neutro Abs: 9.8 10*3/uL — ABNORMAL HIGH (ref 1.7–7.7)
Neutrophils Relative %: 76 %
Platelet Count: 121 10*3/uL — ABNORMAL LOW (ref 150–400)
RBC: 3.18 MIL/uL — ABNORMAL LOW (ref 3.87–5.11)
RDW: 16.6 % — ABNORMAL HIGH (ref 11.5–15.5)
WBC Count: 12.8 10*3/uL — ABNORMAL HIGH (ref 4.0–10.5)
nRBC: 0 % (ref 0.0–0.2)

## 2020-05-18 LAB — CMP (CANCER CENTER ONLY)
ALT: 14 U/L (ref 0–44)
AST: 13 U/L — ABNORMAL LOW (ref 15–41)
Albumin: 3.6 g/dL (ref 3.5–5.0)
Alkaline Phosphatase: 136 U/L — ABNORMAL HIGH (ref 38–126)
Anion gap: 9 (ref 5–15)
BUN: 19 mg/dL (ref 8–23)
CO2: 21 mmol/L — ABNORMAL LOW (ref 22–32)
Calcium: 8.4 mg/dL — ABNORMAL LOW (ref 8.9–10.3)
Chloride: 110 mmol/L (ref 98–111)
Creatinine: 0.46 mg/dL (ref 0.44–1.00)
GFR, Estimated: >60 mL/min (ref >60)
Glucose, Bld: 185 mg/dL — ABNORMAL HIGH (ref 70–99)
Potassium: 3.5 mmol/L (ref 3.5–5.1)
Sodium: 140 mmol/L (ref 135–145)
Total Bilirubin: 0.4 mg/dL (ref 0.3–1.2)
Total Protein: 6.3 g/dL — ABNORMAL LOW (ref 6.5–8.1)

## 2020-05-18 MED ORDER — SODIUM CHLORIDE 0.9 % IV SOLN
10.0000 mg | Freq: Once | INTRAVENOUS | Status: AC
  Administered 2020-05-18: 10 mg via INTRAVENOUS
  Filled 2020-05-18: qty 1

## 2020-05-18 MED ORDER — SODIUM CHLORIDE 0.9 % IV SOLN
400.0000 mg/m2 | Freq: Once | INTRAVENOUS | Status: AC
  Administered 2020-05-18: 628 mg via INTRAVENOUS
  Filled 2020-05-18: qty 31.4

## 2020-05-18 MED ORDER — SODIUM CHLORIDE 0.9 % IV SOLN
125.0000 mg/m2 | Freq: Once | INTRAVENOUS | Status: AC
  Administered 2020-05-18: 200 mg via INTRAVENOUS
  Filled 2020-05-18: qty 10

## 2020-05-18 MED ORDER — OXALIPLATIN CHEMO INJECTION 100 MG/20ML
85.0000 mg/m2 | Freq: Once | INTRAVENOUS | Status: AC
  Administered 2020-05-18: 135 mg via INTRAVENOUS
  Filled 2020-05-18: qty 27

## 2020-05-18 MED ORDER — SODIUM CHLORIDE 0.9 % IV SOLN
INTRAVENOUS | Status: DC
  Filled 2020-05-18 (×2): qty 250

## 2020-05-18 MED ORDER — SODIUM CHLORIDE 0.9 % IV SOLN
2400.0000 mg/m2 | INTRAVENOUS | Status: DC
  Administered 2020-05-18: 3750 mg via INTRAVENOUS
  Filled 2020-05-18: qty 75

## 2020-05-18 MED ORDER — SODIUM CHLORIDE 0.9 % IV SOLN
150.0000 mg | Freq: Once | INTRAVENOUS | Status: AC
  Administered 2020-05-18: 150 mg via INTRAVENOUS
  Filled 2020-05-18: qty 5

## 2020-05-18 MED ORDER — PALONOSETRON HCL INJECTION 0.25 MG/5ML
0.2500 mg | Freq: Once | INTRAVENOUS | Status: AC
  Administered 2020-05-18: 0.25 mg via INTRAVENOUS
  Filled 2020-05-18: qty 5

## 2020-05-18 MED ORDER — DEXTROSE 5 % IV SOLN
Freq: Once | INTRAVENOUS | Status: AC
  Filled 2020-05-18: qty 250

## 2020-05-18 MED ORDER — ATROPINE SULFATE 1 MG/ML IJ SOLN
0.5000 mg | Freq: Once | INTRAMUSCULAR | Status: AC | PRN
  Administered 2020-05-18: 0.5 mg via INTRAVENOUS
  Filled 2020-05-18: qty 1

## 2020-05-18 MED ORDER — HEPARIN SOD (PORK) LOCK FLUSH 100 UNIT/ML IV SOLN
500.0000 [IU] | Freq: Once | INTRAVENOUS | Status: DC | PRN
  Filled 2020-05-18: qty 5

## 2020-05-18 MED ORDER — SODIUM CHLORIDE 0.9% FLUSH
10.0000 mL | INTRAVENOUS | Status: DC | PRN
  Filled 2020-05-18: qty 10

## 2020-05-18 NOTE — Progress Notes (Signed)
Patient tolerated chemo without any problems/difficulties. Patient discharged ambulatory in stable condition to private vehicle. Pump connected and not beeping upon discharge.

## 2020-05-18 NOTE — Telephone Encounter (Signed)
Appointments scheduled calendar printed and given to patient in infusion per 4/11 los

## 2020-05-18 NOTE — Progress Notes (Addendum)
[facility] OFFICE PROGRESS NOTE   Diagnosis: Pancreas cancer  INTERVAL HISTORY:   Nancy Hawkins returns as scheduled.  She completed cycle 4 FOLFIRINOX 05/04/2020.  She received Udenyca on the day of pump discontinuation.  She had a few mild episodes of nausea.  No mouth sores.  One loose stool.  Cold sensitivity lasted 4 to 5 days.  No persistent neuropathy symptoms.  She did not have significant bone pain after Udenyca.  She is no longer having back pain.  She notes an increase in blood sugars a few days after each chemotherapy.  She has sliding scale insulin.  Objective:  Vital signs in last 24 hours:  Blood pressure (!) 146/67, pulse 73, temperature 98 F (36.7 C), temperature source Oral, resp. rate 20, height 5\' 3"  (1.6 m), weight 121 lb 12.8 oz (55.2 kg), SpO2 100 %.    HEENT: No thrush or ulcers. Resp: Lungs clear bilaterally. Cardio: Regular rate and rhythm. GI: Abdomen soft and nontender.  No hepatomegaly. Vascular: No leg edema. Neuro: Vibratory sense mildly decreased over the fingertips per tuning fork exam. Skin: Palms without erythema. Port-A-Cath without erythema.   Lab Results:  Lab Results  Component Value Date   WBC 12.8 (H) 05/18/2020   HGB 9.5 (L) 05/18/2020   HCT 29.7 (L) 05/18/2020   MCV 93.4 05/18/2020   PLT 121 (L) 05/18/2020   NEUTROABS 9.8 (H) 05/18/2020    Imaging:  No results found.  Medications: I have reviewed the patient's current medications.  Assessment/Plan: 1. Pancreas cancer, borderline resectable   CT abdomen/pelvis-inflammation involving the mid body of the pancreas with peripancreatic fat stranding; possible 2.5 x 2.2 cm low-attenuation mass within the mid body of the pancreas, appearsto abut the celiac axis as well as the splenic vein.   MRI 11/18/2019-extensive inflammatory fat stranding about the pancreas with a thinly septated multicystic fluid signal lesion of the anterior pancreatic neck measuring 3.2 x 1.7  cm most consistent with a pancreatic pseudocyst; numerous additional small fluid signal lesions throughout the pancreatic parenchyma, the remaining lesions subcentimeter; central portion of pancreatic duct dilated measuring up to 8 mm without obvious obstructing lesion identified; trace ascites; trace bilateral pleural effusions and associated atelectasis or consolidation.  Colonoscopy 11/20/2019-5 polyps(resected and retrieved-multiple tubular adenomas, negative for high-grade dysplasia); 2 colonicangioectasias; diverticulosis in the entire examined colon; nonbleeding nonthrombosed external and internal hemorrhoids.   EGD/EUS 11/20/2019-few white nummular lesions in the entire esophagus (esophagus biopsy with no significant pathologic findings, no fungal elements); hematin found in the entire examined stomach; gastritis (stomach biopsy showed gastropathy/gastritis); 14 mm semisessile polyp found in the posterior wall of the gastric antrum (hyperplastic polyp);a lesion was identified in the pancreatic body staged T2 N0 MX.FNA of the pancreas lesion showed benign reactive/reparative changes, prominent inflammation.   EUS procedure 01/20/2020-masslike region identified in the pancreatic head, stage T2 N0 MX. 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 Pancreatitis October 2021 3. Diabetes October 2021 4. Isolated episode of atrial  fibrillation approximately 15 years ago 5. Hypertension  6. COVID-19 infection 7. Admission 03/05/2020 with biliary obstruction-ERCP 03/09/2020,biliary obstruction secondary to known pancreatic head mass found in the lower third of the main duct. The upper third of the main bile duct and left and right hepatic ducts and all intrahepatic branches moderately dilated secondary to the stricture. Biliary sphincterotomy performed. Covered metal biliary stent placed into the common bile duct. 8. Port-A-Cath placement 03/11/2020, Dr. [name]   Disposition: [name] appears stable.  She has completed 4 cycles of systemic therapy, 2 FOLFOX and 2 FOLFIRINOX.  Overall she is tolerating chemotherapy well.  Plan to proceed with cycle 5 FOLFIRINOX today as scheduled.  We reviewed the CBC from today.  Counts adequate to proceed with treatment.  We discussed a referral to the genetics counselor.  She would like to hold on this for now.  She will return for lab, follow-up, cycle 6 FOLFIRINOX in 2 weeks.  She will contact the office in the interim with any problems.    Ned Card ANP/GNP-BC   05/18/2020  9:51 AM

## 2020-05-18 NOTE — Patient Instructions (Signed)
[facility] Discharge Instructions for Patients Receiving Chemotherapy  Today you received the following chemotherapy agents Oxaliplatin, Irinotecan, Leucovorin, Fluorouracil  To help prevent nausea and vomiting after your treatment, we encourage you to take your nausea medication: take compazine on 4/11, 4/12, 4/13. On 4/14 may restart taking Zofran (ondansetron) if needed for nausea.   If you develop nausea and vomiting that is not controlled by your nausea medication, call the clinic.   BELOW ARE SYMPTOMS THAT SHOULD BE REPORTED IMMEDIATELY:  *FEVER GREATER THAN 100.5 F  *CHILLS WITH OR WITHOUT FEVER  NAUSEA AND VOMITING THAT IS NOT CONTROLLED WITH YOUR NAUSEA MEDICATION  *UNUSUAL SHORTNESS OF BREATH  *UNUSUAL BRUISING OR BLEEDING  TENDERNESS IN MOUTH AND THROAT WITH OR WITHOUT PRESENCE OF ULCERS  *URINARY PROBLEMS  *BOWEL PROBLEMS  UNUSUAL RASH Items with * indicate a potential emergency and should be followed up as soon as possible.  Feel free to call the clinic should you have any questions or concerns at The clinic phone number is [phone].  Please show the [facility] at check-in to the Emergency Department and triage nurse.

## 2020-05-18 NOTE — Telephone Encounter (Signed)
Notified daughter that K+ is now in low normal range and she needs to continue the KCL 20 meq daily. She will let her know. Was not able to reach patient due to battery is low.

## 2020-05-19 LAB — CANCER ANTIGEN 19-9: CA 19-9: 508 U/mL — ABNORMAL HIGH (ref 0–35)

## 2020-05-20 ENCOUNTER — Other Ambulatory Visit: Payer: Self-pay

## 2020-05-20 ENCOUNTER — Inpatient Hospital Stay: Payer: Medicare Other

## 2020-05-20 VITALS — BP 126/58 | HR 68 | Temp 98.4°F | Resp 18

## 2020-05-20 DIAGNOSIS — Z5111 Encounter for antineoplastic chemotherapy: Secondary | ICD-10-CM | POA: Diagnosis not present

## 2020-05-20 DIAGNOSIS — C250 Malignant neoplasm of head of pancreas: Secondary | ICD-10-CM

## 2020-05-20 MED ORDER — PEGFILGRASTIM-CBQV 6 MG/0.6ML ~~LOC~~ SOSY
6.0000 mg | PREFILLED_SYRINGE | Freq: Once | SUBCUTANEOUS | Status: AC
  Administered 2020-05-20: 6 mg via SUBCUTANEOUS

## 2020-05-20 MED ORDER — HEPARIN SOD (PORK) LOCK FLUSH 100 UNIT/ML IV SOLN
500.0000 [IU] | Freq: Once | INTRAVENOUS | Status: AC | PRN
  Administered 2020-05-20: 500 [IU]
  Filled 2020-05-20: qty 5

## 2020-05-20 MED ORDER — SODIUM CHLORIDE 0.9% FLUSH
10.0000 mL | INTRAVENOUS | Status: DC | PRN
  Administered 2020-05-20: 10 mL
  Filled 2020-05-20: qty 10

## 2020-05-20 NOTE — Patient Instructions (Signed)
Pegfilgrastim injection What is this medicine? PEGFILGRASTIM (PEG fil gra stim) is a long-acting granulocyte colony-stimulating factor that stimulates the growth of neutrophils, a type of white blood cell important in the body's fight against infection. It is used to reduce the incidence of fever and infection in patients with certain types of cancer who are receiving chemotherapy that affects the bone marrow, and to increase survival after being exposed to high doses of radiation. This medicine may be used for other purposes; ask your health care provider or pharmacist if you have questions. COMMON BRAND NAME(S): Fulphila, Neulasta, Nyvepria, UDENYCA, Ziextenzo What should I tell my health care provider before I take this medicine? They need to know if you have any of these conditions:  kidney disease  latex allergy  ongoing radiation therapy  sickle cell disease  skin reactions to acrylic adhesives (On-Body Injector only)  an unusual or allergic reaction to pegfilgrastim, filgrastim, other medicines, foods, dyes, or preservatives  pregnant or trying to get pregnant  breast-feeding How should I use this medicine? This medicine is for injection under the skin. If you get this medicine at home, you will be taught how to prepare and give the pre-filled syringe or how to use the On-body Injector. Refer to the patient Instructions for Use for detailed instructions. Use exactly as directed. Tell your healthcare provider immediately if you suspect that the On-body Injector may not have performed as intended or if you suspect the use of the On-body Injector resulted in a missed or partial dose. It is important that you put your used needles and syringes in a special sharps container. Do not put them in a trash can. If you do not have a sharps container, call your pharmacist or healthcare provider to get one. Talk to your pediatrician regarding the use of this medicine in children. While this drug  may be prescribed for selected conditions, precautions do apply. Overdosage: If you think you have taken too much of this medicine contact a poison control center or emergency room at once. NOTE: This medicine is only for you. Do not share this medicine with others. What if I miss a dose? It is important not to miss your dose. Call your doctor or health care professional if you miss your dose. If you miss a dose due to an On-body Injector failure or leakage, a new dose should be administered as soon as possible using a single prefilled syringe for manual use. What may interact with this medicine? Interactions have not been studied. This list may not describe all possible interactions. Give your health care provider a list of all the medicines, herbs, non-prescription drugs, or dietary supplements you use. Also tell them if you smoke, drink alcohol, or use illegal drugs. Some items may interact with your medicine. What should I watch for while using this medicine? Your condition will be monitored carefully while you are receiving this medicine. You may need blood work done while you are taking this medicine. Talk to your health care provider about your risk of cancer. You may be more at risk for certain types of cancer if you take this medicine. If you are going to need a MRI, CT scan, or other procedure, tell your doctor that you are using this medicine (On-Body Injector only). What side effects may I notice from receiving this medicine? Side effects that you should report to your doctor or health care professional as soon as possible:  allergic reactions (skin rash, itching or hives, swelling of   the face, lips, or tongue)  back pain  dizziness  fever  pain, redness, or irritation at site where injected  pinpoint red spots on the skin  red or dark-brown urine  shortness of breath or breathing problems  stomach or side pain, or pain at the shoulder  swelling  tiredness  trouble  passing urine or change in the amount of urine  unusual bruising or bleeding Side effects that usually do not require medical attention (report to your doctor or health care professional if they continue or are bothersome):  bone pain  muscle pain This list may not describe all possible side effects. Call your doctor for medical advice about side effects. You may report side effects to FDA at 1-800-FDA-1088. Where should I keep my medicine? Keep out of the reach of children. If you are using this medicine at home, you will be instructed on how to store it. Throw away any unused medicine after the expiration date on the label. NOTE: This sheet is a summary. It may not cover all possible information. If you have questions about this medicine, talk to your doctor, pharmacist, or health care provider.  2021 Elsevier/Gold Standard (2019-02-15 13:20:51)  

## 2020-05-21 ENCOUNTER — Telehealth: Payer: Self-pay | Admitting: Nutrition

## 2020-05-21 ENCOUNTER — Telehealth: Payer: Self-pay | Admitting: *Deleted

## 2020-05-21 NOTE — Telephone Encounter (Signed)
Called to report that yesterday she was experiencing very dry mouth and throat was sore w/difficulty swallowing. Seems to have improved today. She denies any sores in mouth or sign of thrush (had daughter look in her mouth). Suggested she try warm water rinses with 1 cup warm water, 1/4 tsp baking soda and 1/8 tsp salt several times each day. Drink plenty of water and can suck on hard candy or chew gum to stimulate saliva production. Take Tylenol prn throat discomfort. Call back if she develops sores, fever or not able to swallow.

## 2020-05-21 NOTE — Telephone Encounter (Signed)
Telephone follow-up completed with patient receiving infusion for pancreas cancer.  She is followed by Dr. Benay Spice.  Her weight decreased to 121.8 pounds on April 11 down from 124.5 pounds March 28.  Patient denies nausea.  She reports 1 episode of diarrhea which improved after taking Imodium.  She noted she woke this morning with dry mouth and sore throat and a great deal of difficulty swallowing.  She is now feeling somewhat improved and has been tolerating soft foods such as mashed potatoes, Jell-O, pudding, and other liquids.  She has protein shakes to supplement her food intake.  Nutrition diagnosis: Unintended weight loss continues.  Intervention: Educated patient on continuing strategies for smaller more frequent meals and snacks altering textures and consistencies as needed. Recommended protein shakes to supplement food intake. Encourage patient to contact RD with questions.  Monitoring, evaluation, goals: Patient will tolerate adequate calories and protein to minimize weight loss.  Next visit: To be scheduled as needed.  **Disclaimer: This note was dictated with voice recognition software. Similar sounding words can inadvertently be transcribed and this note may contain transcription errors which may not have been corrected upon publication of note.**

## 2020-05-26 ENCOUNTER — Other Ambulatory Visit: Payer: Self-pay | Admitting: Oncology

## 2020-05-26 ENCOUNTER — Other Ambulatory Visit: Payer: Self-pay | Admitting: Nurse Practitioner

## 2020-05-26 DIAGNOSIS — C250 Malignant neoplasm of head of pancreas: Secondary | ICD-10-CM

## 2020-05-28 ENCOUNTER — Other Ambulatory Visit: Payer: Self-pay | Admitting: Oncology

## 2020-05-29 ENCOUNTER — Other Ambulatory Visit: Payer: Self-pay | Admitting: Oncology

## 2020-05-29 ENCOUNTER — Other Ambulatory Visit: Payer: Self-pay | Admitting: *Deleted

## 2020-05-29 MED ORDER — DICYCLOMINE HCL 20 MG PO TABS: 20.0000 mg | ORAL_TABLET | Freq: Four times a day (QID) | ORAL | 1 refills | Status: DC | PRN

## 2020-05-29 NOTE — Telephone Encounter (Signed)
Per insurance non coverage, changed hyoscyamine to formulary dicyclomine 20 mg qid prn at Dr. Gearldine Shown approval.

## 2020-05-31 ENCOUNTER — Other Ambulatory Visit: Payer: Self-pay | Admitting: Oncology

## 2020-06-02 ENCOUNTER — Other Ambulatory Visit: Payer: Self-pay

## 2020-06-02 ENCOUNTER — Inpatient Hospital Stay (HOSPITAL_BASED_OUTPATIENT_CLINIC_OR_DEPARTMENT_OTHER): Payer: Medicare Other | Admitting: Oncology

## 2020-06-02 ENCOUNTER — Inpatient Hospital Stay: Payer: Medicare Other

## 2020-06-02 ENCOUNTER — Telehealth: Payer: Self-pay | Admitting: Oncology

## 2020-06-02 VITALS — BP 135/55 | HR 79 | Temp 97.8°F | Resp 18 | Ht 63.0 in | Wt 121.4 lb

## 2020-06-02 DIAGNOSIS — Z8616 Personal history of COVID-19: Secondary | ICD-10-CM

## 2020-06-02 DIAGNOSIS — E119 Type 2 diabetes mellitus without complications: Secondary | ICD-10-CM

## 2020-06-02 DIAGNOSIS — I10 Essential (primary) hypertension: Secondary | ICD-10-CM | POA: Diagnosis not present

## 2020-06-02 DIAGNOSIS — Z79899 Other long term (current) drug therapy: Secondary | ICD-10-CM

## 2020-06-02 DIAGNOSIS — C250 Malignant neoplasm of head of pancreas: Secondary | ICD-10-CM

## 2020-06-02 DIAGNOSIS — Z5111 Encounter for antineoplastic chemotherapy: Secondary | ICD-10-CM | POA: Diagnosis not present

## 2020-06-02 DIAGNOSIS — Z95828 Presence of other vascular implants and grafts: Secondary | ICD-10-CM

## 2020-06-02 LAB — CMP (CANCER CENTER ONLY)
ALT: 11 U/L (ref 0–44)
AST: 11 U/L — ABNORMAL LOW (ref 15–41)
Albumin: 3.5 g/dL (ref 3.5–5.0)
Alkaline Phosphatase: 145 U/L — ABNORMAL HIGH (ref 38–126)
Anion gap: 8 (ref 5–15)
BUN: 16 mg/dL (ref 8–23)
CO2: 23 mmol/L (ref 22–32)
Calcium: 8.4 mg/dL — ABNORMAL LOW (ref 8.9–10.3)
Chloride: 110 mmol/L (ref 98–111)
Creatinine: 0.37 mg/dL — ABNORMAL LOW (ref 0.44–1.00)
GFR, Estimated: >60 mL/min (ref >60)
Glucose, Bld: 192 mg/dL — ABNORMAL HIGH (ref 70–99)
Potassium: 3.5 mmol/L (ref 3.5–5.1)
Sodium: 141 mmol/L (ref 135–145)
Total Bilirubin: 0.6 mg/dL (ref 0.3–1.2)
Total Protein: 6.1 g/dL — ABNORMAL LOW (ref 6.5–8.1)

## 2020-06-02 LAB — CBC WITH DIFFERENTIAL (CANCER CENTER ONLY)
Abs Immature Granulocytes: 0.55 10*3/uL — ABNORMAL HIGH (ref 0.00–0.07)
Basophils Absolute: 0.1 10*3/uL (ref 0.0–0.1)
Basophils Relative: 0 %
Eosinophils Absolute: 0.2 10*3/uL (ref 0.0–0.5)
Eosinophils Relative: 1 %
HCT: 27.9 % — ABNORMAL LOW (ref 36.0–46.0)
Hemoglobin: 9 g/dL — ABNORMAL LOW (ref 12.0–15.0)
Immature Granulocytes: 4 %
Lymphocytes Relative: 12 %
Lymphs Abs: 1.6 10*3/uL (ref 0.7–4.0)
MCH: 31 pg (ref 26.0–34.0)
MCHC: 32.3 g/dL (ref 30.0–36.0)
MCV: 96.2 fL (ref 80.0–100.0)
Monocytes Absolute: 1 10*3/uL (ref 0.1–1.0)
Monocytes Relative: 7 %
Neutro Abs: 10.1 10*3/uL — ABNORMAL HIGH (ref 1.7–7.7)
Neutrophils Relative %: 76 %
Platelet Count: 164 10*3/uL (ref 150–400)
RBC: 2.9 MIL/uL — ABNORMAL LOW (ref 3.87–5.11)
RDW: 17.3 % — ABNORMAL HIGH (ref 11.5–15.5)
WBC Count: 13.5 10*3/uL — ABNORMAL HIGH (ref 4.0–10.5)
nRBC: 0 % (ref 0.0–0.2)

## 2020-06-02 LAB — MAGNESIUM: Magnesium: 1.6 mg/dL — ABNORMAL LOW (ref 1.7–2.4)

## 2020-06-02 MED ORDER — SODIUM CHLORIDE 0.9 % IV SOLN
150.0000 mg | Freq: Once | INTRAVENOUS | Status: AC
Start: 2020-06-02 — End: 2020-06-02
  Administered 2020-06-02: 150 mg via INTRAVENOUS
  Filled 2020-06-02: qty 5

## 2020-06-02 MED ORDER — ATROPINE SULFATE 1 MG/ML IJ SOLN
0.5000 mg | Freq: Once | INTRAMUSCULAR | Status: AC | PRN
  Administered 2020-06-02: 0.5 mg via INTRAVENOUS
  Filled 2020-06-02: qty 1

## 2020-06-02 MED ORDER — SODIUM CHLORIDE 0.9% FLUSH
10.0000 mL | INTRAVENOUS | Status: DC | PRN
  Filled 2020-06-02: qty 10

## 2020-06-02 MED ORDER — OXALIPLATIN CHEMO INJECTION 100 MG/20ML
85.0000 mg/m2 | Freq: Once | INTRAVENOUS | Status: AC
  Administered 2020-06-02: 135 mg via INTRAVENOUS
  Filled 2020-06-02: qty 20

## 2020-06-02 MED ORDER — SODIUM CHLORIDE 0.9 % IV SOLN
10.0000 mg | Freq: Once | INTRAVENOUS | Status: AC
  Administered 2020-06-02: 10 mg via INTRAVENOUS
  Filled 2020-06-02: qty 1

## 2020-06-02 MED ORDER — DEXTROSE 5 % IV SOLN
Freq: Once | INTRAVENOUS | Status: AC
  Filled 2020-06-02: qty 250

## 2020-06-02 MED ORDER — SODIUM CHLORIDE 0.9% FLUSH
10.0000 mL | Freq: Once | INTRAVENOUS | Status: AC
  Administered 2020-06-02: 10 mL
  Filled 2020-06-02: qty 10

## 2020-06-02 MED ORDER — PALONOSETRON HCL INJECTION 0.25 MG/5ML
0.2500 mg | Freq: Once | INTRAVENOUS | Status: AC
  Administered 2020-06-02: 0.25 mg via INTRAVENOUS
  Filled 2020-06-02: qty 5

## 2020-06-02 MED ORDER — SODIUM CHLORIDE 0.9 % IV SOLN
INTRAVENOUS | Status: DC
  Filled 2020-06-02: qty 250

## 2020-06-02 MED ORDER — SODIUM CHLORIDE 0.9 % IV SOLN
125.0000 mg/m2 | Freq: Once | INTRAVENOUS | Status: AC
  Administered 2020-06-02: 200 mg via INTRAVENOUS
  Filled 2020-06-02: qty 10

## 2020-06-02 MED ORDER — HEPARIN SOD (PORK) LOCK FLUSH 100 UNIT/ML IV SOLN
500.0000 [IU] | Freq: Once | INTRAVENOUS | Status: DC | PRN
  Filled 2020-06-02: qty 5

## 2020-06-02 MED ORDER — SODIUM CHLORIDE 0.9 % IV SOLN
2400.0000 mg/m2 | INTRAVENOUS | Status: DC
  Administered 2020-06-02: 3750 mg via INTRAVENOUS
  Filled 2020-06-02: qty 75

## 2020-06-02 MED ORDER — SODIUM CHLORIDE 0.9 % IV SOLN
400.0000 mg/m2 | Freq: Once | INTRAVENOUS | Status: AC
  Administered 2020-06-02: 628 mg via INTRAVENOUS
  Filled 2020-06-02: qty 31.4

## 2020-06-02 NOTE — Progress Notes (Signed)
[facility] OFFICE PROGRESS NOTE    Diagnosis: Pancreas cancer  INTERVAL HISTORY:   Nancy Hawkins completed another cycle of FOLFIRINOX on 05/18/2020.  She had mild nausea following chemotherapy.  No mouth sores or diarrhea.  She has altered taste.  She had cold sensitivity following chemotherapy no other neuropathy symptoms. She has an occasional abdominal cramp.  No back pain. Objective:  Vital signs in last 24 hours:  Blood pressure (!) 135/55, pulse 79, temperature 97.8 F (36.6 C), temperature source Tympanic, resp. rate 18, height 5\' 3"  (1.6 m), weight 121 lb 6.4 oz (55.1 kg), SpO2 100 %.    HEENT: No thrush or ulcers Resp: Lungs clear bilaterally Cardio: Regular rate and rhythm GI: Nontender, no mass, no hepatosplenomegaly Vascular: No leg edema Neuro: Moderate loss of vibratory sense at fingertips bilaterally  Portacath/PICC-without erythema  Lab Results:  Lab Results  Component Value Date   WBC 12.8 (H) 05/18/2020   HGB 9.5 (L) 05/18/2020   HCT 29.7 (L) 05/18/2020   MCV 93.4 05/18/2020   PLT 121 (L) 05/18/2020   NEUTROABS 9.8 (H) 05/18/2020    CMP  Lab Results  Component Value Date   NA 140 05/18/2020   K 3.5 05/18/2020   CL 110 05/18/2020   CO2 21 (L) 05/18/2020   GLUCOSE 185 (H) 05/18/2020   BUN 19 05/18/2020   CREATININE 0.46 05/18/2020   CALCIUM 8.4 (L) 05/18/2020   PROT 6.3 (L) 05/18/2020   ALBUMIN 3.6 05/18/2020   AST 13 (L) 05/18/2020   ALT 14 05/18/2020   ALKPHOS 136 (H) 05/18/2020   BILITOT 0.4 05/18/2020   GFRNONAA >60 05/18/2020   GFRAA 109 03/03/2020     Medications: I have reviewed the patient's current medications.   Assessment/Plan: 1. Pancreas cancer, borderline resectable   CT abdomen/pelvis-inflammation involving the mid body of the pancreas with peripancreatic fat stranding; possible 2.5 x 2.2 cm low-attenuation mass within the mid body of the pancreas, appearsto abut the celiac axis as well as the splenic  vein.   MRI 11/18/2019-extensive inflammatory fat stranding about the pancreas with a thinly septated multicystic fluid signal lesion of the anterior pancreatic neck measuring 3.2 x 1.7 cm most consistent with a pancreatic pseudocyst; numerous additional small fluid signal lesions throughout the pancreatic parenchyma, the remaining lesions subcentimeter; central portion of pancreatic duct dilated measuring up to 8 mm without obvious obstructing lesion identified; trace ascites; trace bilateral pleural effusions and associated atelectasis or consolidation.  Colonoscopy 11/20/2019-5 polyps(resected and retrieved-multiple tubular adenomas, negative for high-grade dysplasia); 2 colonicangioectasias; diverticulosis in the entire examined colon; nonbleeding nonthrombosed external and internal hemorrhoids.   EGD/EUS 11/20/2019-few white nummular lesions in the entire esophagus (esophagus biopsy with no significant pathologic findings, no fungal elements); hematin found in the entire examined stomach; gastritis (stomach biopsy showed gastropathy/gastritis); 14 mm semisessile polyp found in the posterior wall of the gastric antrum (hyperplastic polyp);a lesion was identified in the pancreatic body staged T2 N0 MX.FNA of the pancreas lesion showed benign reactive/reparative changes, prominent inflammation.   EUS procedure 01/20/2020-masslike region identified in the pancreatic head, stage T2 N0 MX. 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  CTs chest/pelvis 01/29/2020-no evidence of metastatic disease  CT abdomen/pelvis 03/05/2020-increase in size of pancreas head/uncinate mass, marked narrowing of the adjacent portal vein, patchy consolidation in the right lower lung, mild intrahepatic and extrahepatic biliary ductal dilatation  Cycle 1 FOLFOX 03/23/2020  Cycle 2 FOLFOX 04/06/2020, Emend added for delayed nausea  after cycle 1  Cycle 3  FOLFIRINOX 04/20/2020  Cycle 4 FOLFIRINOX 05/04/2020, Udenyca  Cycle 5 FOLFIRINOX 05/18/2020, Udenyca  Cycle 6 FOLFIRINOX 06/02/2020, Udenyca 2. Pancreatitis October 2021 3. Diabetes October 2021 4. Isolated episode of atrial fibrillation approximately 15 years ago 5. Hypertension 6. COVID-19 infection 7. Admission 03/05/2020 with biliary obstruction-ERCP 03/09/2020,biliary obstruction secondary to known pancreatic head mass found in the lower third of the main duct. The upper third of the main bile duct and left and right hepatic ducts and all intrahepatic branches moderately dilated secondary to the stricture. Biliary sphincterotomy performed. Covered metal biliary stent placed into the common bile duct. 8. Port-A-Cath placement 03/11/2020, Dr. [name] 9. Oxaliplatin neuropathy     Disposition: Nancy Hawkins has completed 5 cycles of neoadjuvant chemotherapy.  She continues to tolerate chemotherapy well.  She is developing oxaliplatin neuropathy.  She understands the potential of progressive neuropathy with further oxaliplatin therapy.  She agrees to proceed.  We will likely discontinue oxaliplatin after this cycle.  She will complete another cycle of FOLFIRINOX today.  On Nancy Hawkins undergo a restaging CT evaluation after this cycle.  [name], MD  06/02/2020  8:29 AM

## 2020-06-02 NOTE — Telephone Encounter (Signed)
Appointments scheduled calendar printed per 4/26 los 

## 2020-06-04 ENCOUNTER — Inpatient Hospital Stay: Payer: Medicare Other

## 2020-06-04 ENCOUNTER — Other Ambulatory Visit: Payer: Self-pay

## 2020-06-04 VITALS — BP 135/50 | HR 71 | Temp 98.4°F | Resp 16

## 2020-06-04 DIAGNOSIS — Z95828 Presence of other vascular implants and grafts: Secondary | ICD-10-CM

## 2020-06-04 DIAGNOSIS — Z5111 Encounter for antineoplastic chemotherapy: Secondary | ICD-10-CM | POA: Diagnosis not present

## 2020-06-04 DIAGNOSIS — C250 Malignant neoplasm of head of pancreas: Secondary | ICD-10-CM

## 2020-06-04 MED ORDER — PEGFILGRASTIM-CBQV 6 MG/0.6ML ~~LOC~~ SOSY
6.0000 mg | PREFILLED_SYRINGE | Freq: Once | SUBCUTANEOUS | Status: AC
  Administered 2020-06-04: 6 mg via SUBCUTANEOUS

## 2020-06-04 MED ORDER — HEPARIN SOD (PORK) LOCK FLUSH 100 UNIT/ML IV SOLN
500.0000 [IU] | Freq: Once | INTRAVENOUS | Status: AC
  Administered 2020-06-04: 500 [IU] via INTRAVENOUS
  Filled 2020-06-04: qty 5

## 2020-06-04 MED ORDER — SODIUM CHLORIDE 0.9% FLUSH
10.0000 mL | INTRAVENOUS | Status: DC | PRN
  Administered 2020-06-04: 10 mL via INTRAVENOUS
  Filled 2020-06-04: qty 10

## 2020-06-04 NOTE — Patient Instructions (Signed)
[facility]  Discharge Instructions: Thank you for choosing [facility] to provide your oncology and hematology care.   If you have a lab appointment with the [facility], please go directly to the [facility] and check in at the registration area.   Wear comfortable clothing and clothing appropriate for easy access to any Portacath or PICC line.   We strive to give you quality time with your provider. You may need to reschedule your appointment if you arrive late (15 or more minutes).  Arriving late affects you and other patients whose appointments are after yours.  Also, if you miss three or more appointments without notifying the office, you may be dismissed from the clinic at the provider's discretion.      For prescription refill requests, have your pharmacy contact our office and allow 72 hours for refills to be completed.    Today you had your port de-accessed and ambulatory pump removed.  Udencya given.  Follow up as scheduled.    To help prevent nausea and vomiting after your treatment, we encourage you to take your nausea medication as directed.  BELOW ARE SYMPTOMS THAT SHOULD BE REPORTED IMMEDIATELY: . *FEVER GREATER THAN 100.4 F (38 C) OR HIGHER . *CHILLS OR SWEATING . *NAUSEA AND VOMITING THAT IS NOT CONTROLLED WITH YOUR NAUSEA MEDICATION . *UNUSUAL SHORTNESS OF BREATH . *UNUSUAL BRUISING OR BLEEDING . *URINARY PROBLEMS (pain or burning when urinating, or frequent urination) . *BOWEL PROBLEMS (unusual diarrhea, constipation, pain near the anus) . TENDERNESS IN MOUTH AND THROAT WITH OR WITHOUT PRESENCE OF ULCERS (sore throat, sores in mouth, or a toothache) . UNUSUAL RASH, SWELLING OR PAIN  . UNUSUAL VAGINAL DISCHARGE OR ITCHING   Items with * indicate a potential emergency and should be followed up as soon as possible or go to the Emergency Department if any problems should occur.  Please show the CHEMOTHERAPY ALERT CARD or  IMMUNOTHERAPY ALERT CARD at check-in to the Emergency Department and triage nurse.  Should you have questions after your visit or need to cancel or reschedule your appointment, please contact [facility]  Dept: [phone]  and follow the prompts.  Office hours are 8:00 a.m. to 4:30 p.m. Monday - Friday. Please note that voicemails left after 4:00 p.m. may not be returned until the following business day.  We are closed weekends and major holidays. You have access to a nurse at all times for urgent questions. Please call the main number to the clinic Dept: [phone] and follow the prompts.   For any non-urgent questions, you may also contact your provider using MyChart. We now offer e-Visits for anyone 80 and older to request care online for non-urgent symptoms. For details visit [email or web address].   Also download the MyChart app! Go to the app store, search "MyChart", open the app, select [facility], and log in with your MyChart username and password.  Due to Covid, a mask is required upon entering the hospital/clinic. If you do not have a mask, one will be given to you upon arrival. For doctor visits, patients may have 1 support person aged 80 or older with them. For treatment visits, patients cannot have anyone with them due to current Covid guidelines and our immunocompromised population.

## 2020-06-04 NOTE — Progress Notes (Signed)
Nancy Hawkins presented for Portacath de-access and flush.  Portacath located right chest wall.  Good blood return present. Portacath flushed with 32ml NS and 500U/41ml Heparin and needle removed intact.  Procedure tolerated well and without incident.    Nancy Hawkins presents today for injection per the provider's orders.  Udencya administration without incident; injection site WNL; see MAR for injection details.  Patient tolerated procedure well and without incident.  No questions or complaints noted at this time.  Pt stable during and after procedure.  Vital signs stable. AVS reviewed.  Discharged in stable condition ambulatory.

## 2020-06-14 ENCOUNTER — Other Ambulatory Visit: Payer: Self-pay | Admitting: Oncology

## 2020-06-15 ENCOUNTER — Inpatient Hospital Stay: Payer: Medicare Other | Attending: Nurse Practitioner

## 2020-06-15 ENCOUNTER — Other Ambulatory Visit: Payer: Medicare Other

## 2020-06-15 ENCOUNTER — Inpatient Hospital Stay: Payer: Medicare Other

## 2020-06-15 ENCOUNTER — Ambulatory Visit (HOSPITAL_BASED_OUTPATIENT_CLINIC_OR_DEPARTMENT_OTHER)
Admission: RE | Admit: 2020-06-15 | Discharge: 2020-06-15 | Disposition: A | Discharge status: Home / Self Care | Payer: Medicare Other | Source: Ambulatory Visit | Attending: Oncology | Admitting: Oncology

## 2020-06-15 ENCOUNTER — Other Ambulatory Visit: Payer: Self-pay

## 2020-06-15 DIAGNOSIS — C250 Malignant neoplasm of head of pancreas: Secondary | ICD-10-CM | POA: Diagnosis not present

## 2020-06-15 DIAGNOSIS — G620 Drug-induced polyneuropathy: Secondary | ICD-10-CM | POA: Insufficient documentation

## 2020-06-15 DIAGNOSIS — Z5189 Encounter for other specified aftercare: Secondary | ICD-10-CM | POA: Insufficient documentation

## 2020-06-15 DIAGNOSIS — Z79899 Other long term (current) drug therapy: Secondary | ICD-10-CM | POA: Insufficient documentation

## 2020-06-15 DIAGNOSIS — I10 Essential (primary) hypertension: Secondary | ICD-10-CM | POA: Insufficient documentation

## 2020-06-15 DIAGNOSIS — C251 Malignant neoplasm of body of pancreas: Secondary | ICD-10-CM | POA: Insufficient documentation

## 2020-06-15 DIAGNOSIS — E119 Type 2 diabetes mellitus without complications: Secondary | ICD-10-CM | POA: Insufficient documentation

## 2020-06-15 DIAGNOSIS — Z95828 Presence of other vascular implants and grafts: Secondary | ICD-10-CM

## 2020-06-15 DIAGNOSIS — Z8616 Personal history of COVID-19: Secondary | ICD-10-CM | POA: Insufficient documentation

## 2020-06-15 DIAGNOSIS — T451X5A Adverse effect of antineoplastic and immunosuppressive drugs, initial encounter: Secondary | ICD-10-CM | POA: Insufficient documentation

## 2020-06-15 DIAGNOSIS — Z5111 Encounter for antineoplastic chemotherapy: Secondary | ICD-10-CM | POA: Insufficient documentation

## 2020-06-15 LAB — CMP (CANCER CENTER ONLY)
ALT: 15 U/L (ref 0–44)
AST: 13 U/L — ABNORMAL LOW (ref 15–41)
Albumin: 3.7 g/dL (ref 3.5–5.0)
Alkaline Phosphatase: 178 U/L — ABNORMAL HIGH (ref 38–126)
Anion gap: 9 (ref 5–15)
BUN: 17 mg/dL (ref 8–23)
CO2: 21 mmol/L — ABNORMAL LOW (ref 22–32)
Calcium: 8.4 mg/dL — ABNORMAL LOW (ref 8.9–10.3)
Chloride: 109 mmol/L (ref 98–111)
Creatinine: 0.42 mg/dL — ABNORMAL LOW (ref 0.44–1.00)
GFR, Estimated: >60 mL/min (ref >60)
Glucose, Bld: 186 mg/dL — ABNORMAL HIGH (ref 70–99)
Potassium: 3.7 mmol/L (ref 3.5–5.1)
Sodium: 139 mmol/L (ref 135–145)
Total Bilirubin: 0.5 mg/dL (ref 0.3–1.2)
Total Protein: 6.3 g/dL — ABNORMAL LOW (ref 6.5–8.1)

## 2020-06-15 LAB — CBC WITH DIFFERENTIAL (CANCER CENTER ONLY)
Abs Immature Granulocytes: 0.51 10*3/uL — ABNORMAL HIGH (ref 0.00–0.07)
Basophils Absolute: 0.1 10*3/uL (ref 0.0–0.1)
Basophils Relative: 0 %
Eosinophils Absolute: 0.2 10*3/uL (ref 0.0–0.5)
Eosinophils Relative: 1 %
HCT: 26.1 % — ABNORMAL LOW (ref 36.0–46.0)
Hemoglobin: 8.3 g/dL — ABNORMAL LOW (ref 12.0–15.0)
Immature Granulocytes: 3 %
Lymphocytes Relative: 10 %
Lymphs Abs: 1.5 10*3/uL (ref 0.7–4.0)
MCH: 31.2 pg (ref 26.0–34.0)
MCHC: 31.8 g/dL (ref 30.0–36.0)
MCV: 98.1 fL (ref 80.0–100.0)
Monocytes Absolute: 0.9 10*3/uL (ref 0.1–1.0)
Monocytes Relative: 6 %
Neutro Abs: 12.2 10*3/uL — ABNORMAL HIGH (ref 1.7–7.7)
Neutrophils Relative %: 80 %
Platelet Count: 117 10*3/uL — ABNORMAL LOW (ref 150–400)
RBC: 2.66 MIL/uL — ABNORMAL LOW (ref 3.87–5.11)
RDW: 17.4 % — ABNORMAL HIGH (ref 11.5–15.5)
WBC Count: 15.3 10*3/uL — ABNORMAL HIGH (ref 4.0–10.5)
nRBC: 0 % (ref 0.0–0.2)

## 2020-06-15 IMAGING — CT CT CHEST-ABD-PELV W/ CM
2 of 5 series · 13 of 46 positions shown, 15 images · IV contrast (APPLIED)
Comparison: [DATE].

CLINICAL DATA: Restaging of pancreatic neoplasm in a 80-year-old
female.

EXAM:
CT CHEST, ABDOMEN, AND PELVIS WITH CONTRAST
TECHNIQUE: Multidetector CT imaging of the chest, abdomen and pelvis was
performed following the standard protocol during bolus
administration of intravenous contrast.
CONTRAST:  80mL OMNIPAQUE IOHEXOL 300 MG/ML  SOLN

[Series 2: cap with · axial · 0.73mm/px · z∈[+1062,+1562]mm · 10 of 120 slices shown, 12 images]
[im 10/120  soft-tissue]
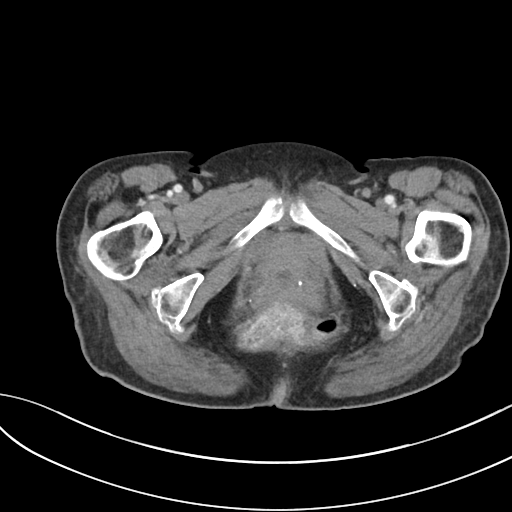
[im 10/120  bone]
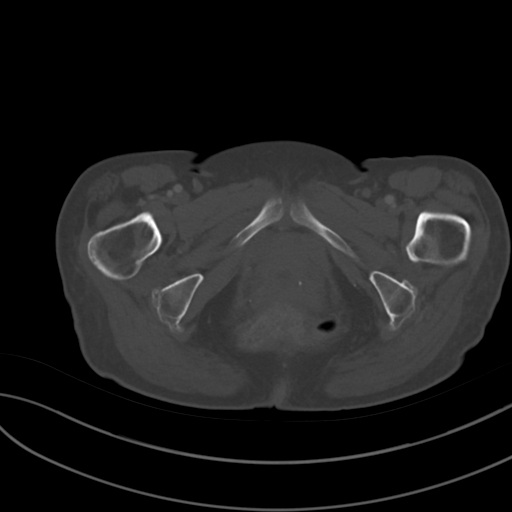
[im 20/120  soft-tissue]
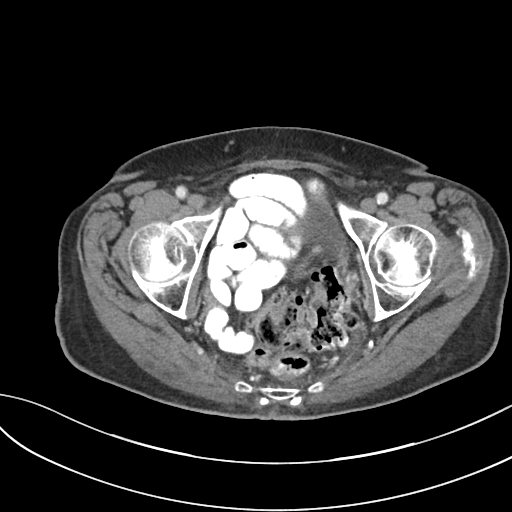
[im 30/120  soft-tissue]
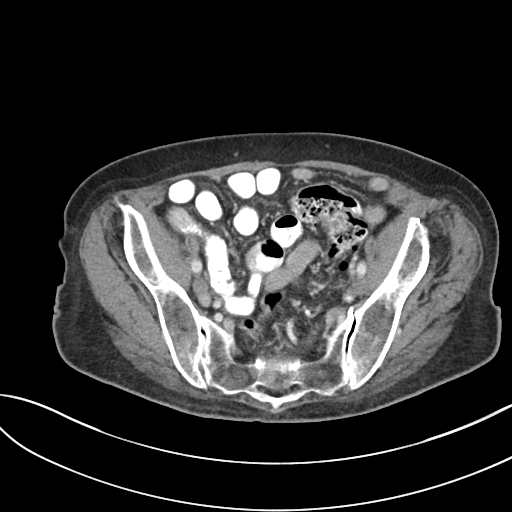
[im 40/120  soft-tissue]
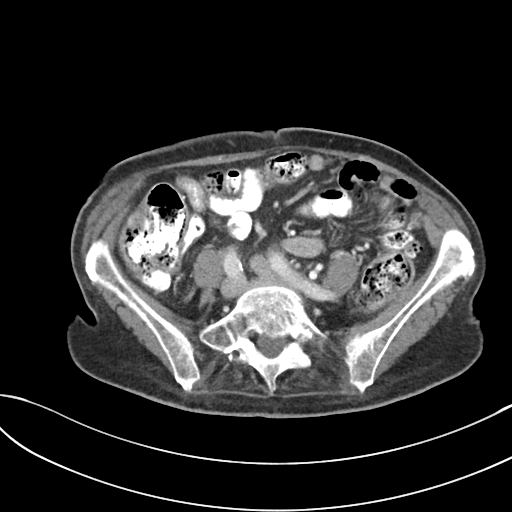
[im 50/120  soft-tissue]
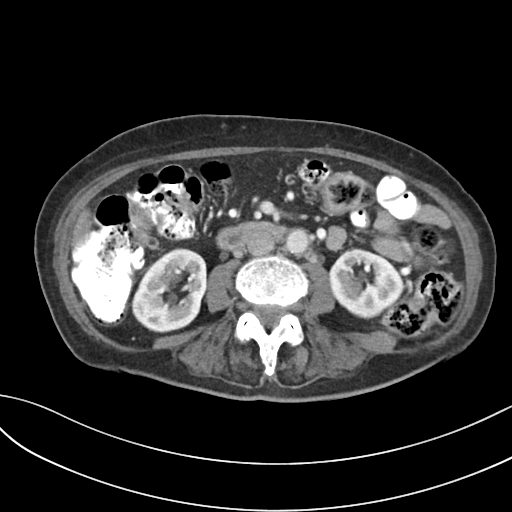
[im 70/120  soft-tissue]
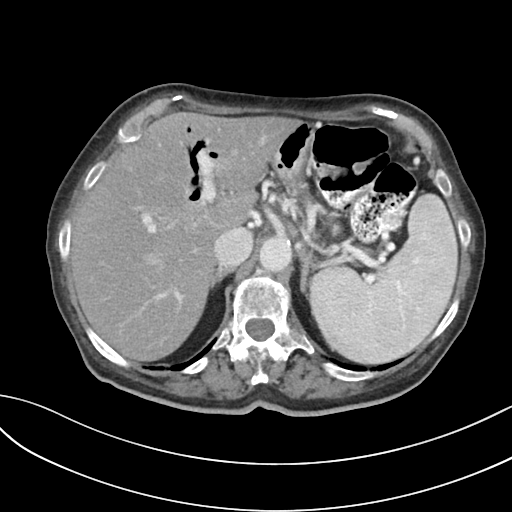
[im 80/120  soft-tissue]
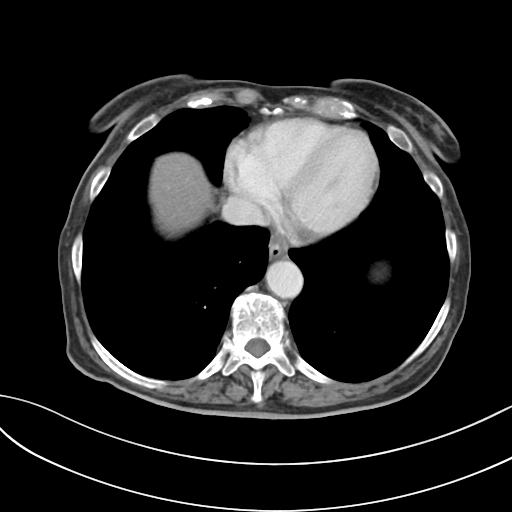
[im 90/120  soft-tissue]
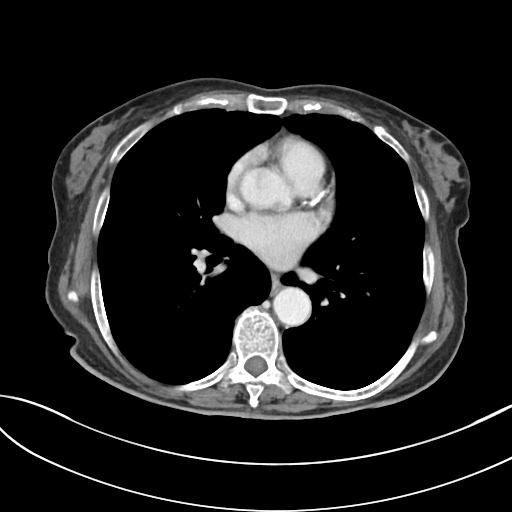
[im 100/120  soft-tissue]
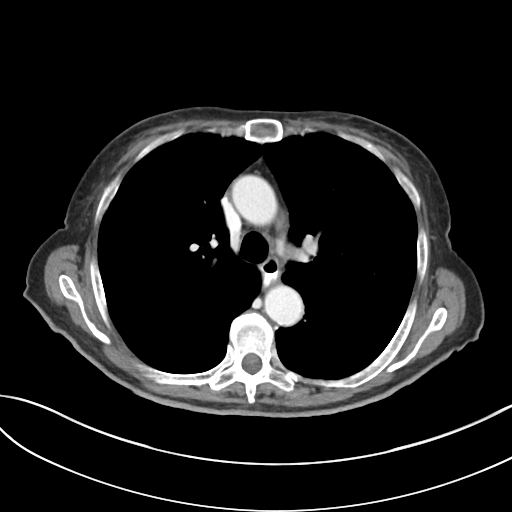
[im 100/120  bone]
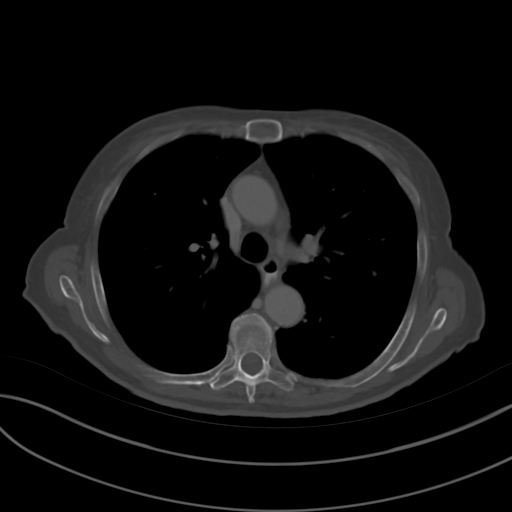
[im 110/120  soft-tissue]
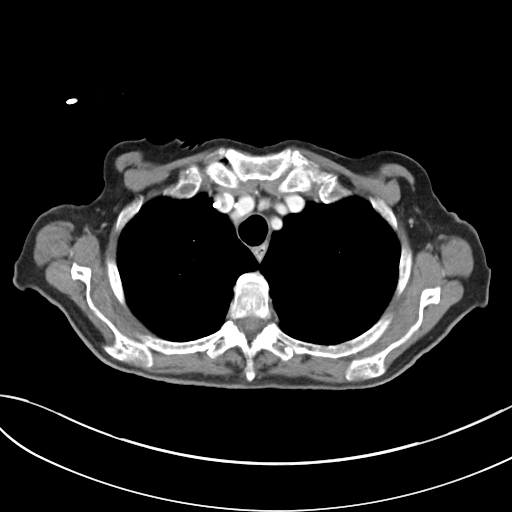

[Series 5: coronal · coronal · 0.76mm/px · 3 of 94 slices shown]
[im 32/94  soft-tissue]
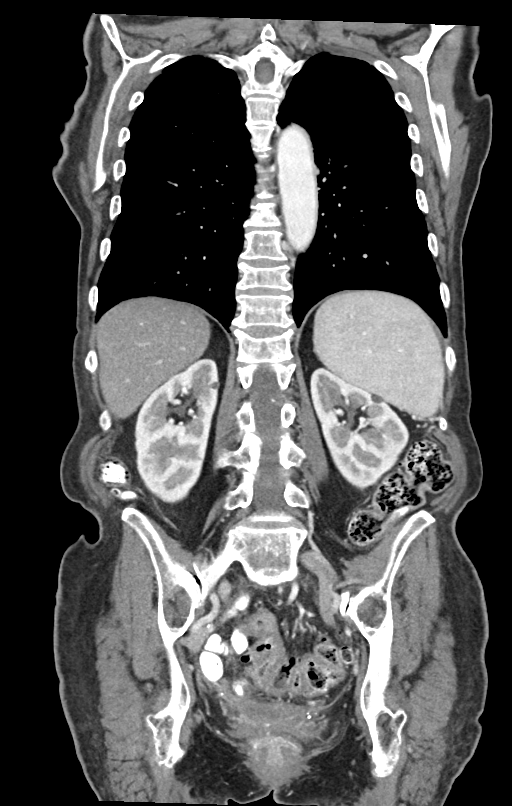
[im 42/94  soft-tissue]
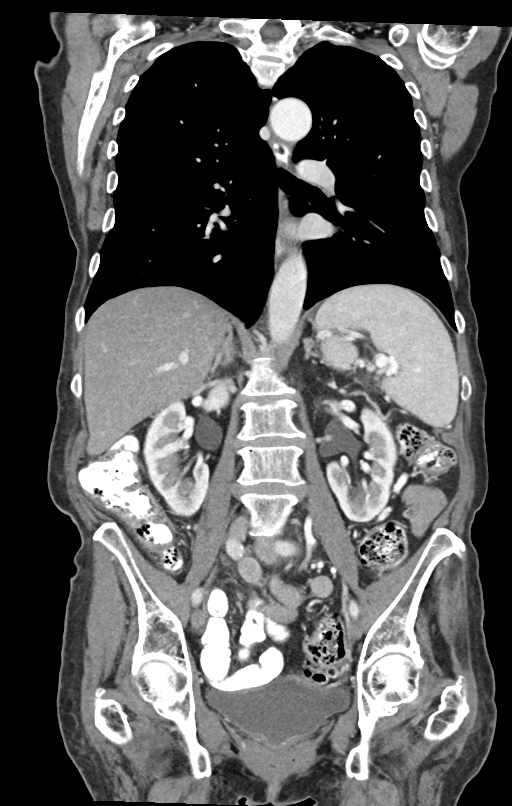
[im 52/94  soft-tissue]
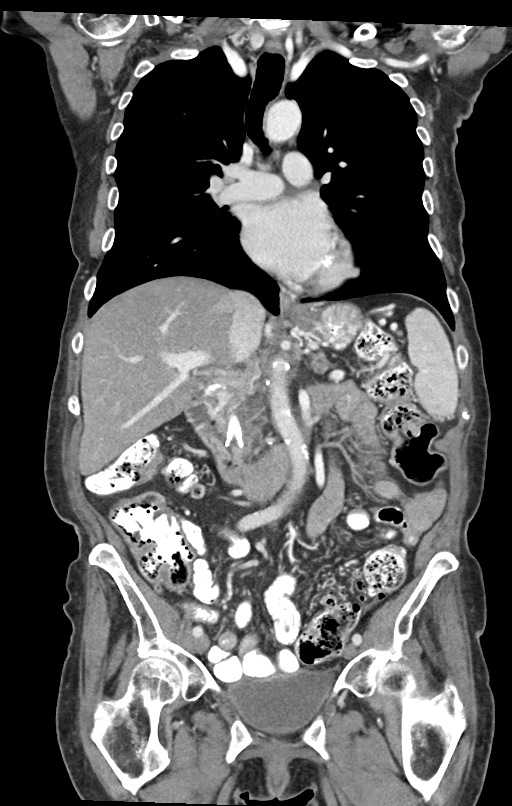

[13 of 46 positions shown; findings below may reference images not displayed]

FINDINGS: CT CHEST FINDINGS

Cardiovascular: Calcified atheromatous plaque and noncalcified
plaque in the thoracic aorta similar to prior exams. Three-vessel
coronary artery disease. Heart size is stable without pericardial
effusion. Central pulmonary vasculature is normal caliber.

Mediastinum/Nodes: Esophagus mildly patulous with some ingested
contrast media in the lumen of the esophagus.

Central venous access device terminates in the mid superior vena
cava. No thoracic inlet, axillary or mediastinal lymphadenopathy. No
hilar lymphadenopathy.

Lungs/Pleura: No sign of suspicious pulmonary nodule. No
consolidation. No pleural effusion. Airways are patent. Mild basilar
atelectasis.

Musculoskeletal: See below for full musculoskeletal details.
Osteopenia. Subacute rib fractures of LEFT-sided sixth and seventh
ribs.

CT ABDOMEN PELVIS FINDINGS

Hepatobiliary: Post stent placement in the common bile duct since
previous imaging there is resultant pneumobilia. Mildly
heterogeneous enhancement of the LEFT hepatic lobe. Suspected
background hepatic steatosis. No focal, tiny area of hypodensity
seen in the RIGHT hepatic lobe 5 mm. This is not seen on previous
imaging. Portal vein is highly narrowed at the level of the
pancreatic head which is similar to the prior study. Flow remains
present into the liver. Collateral pathways are present about the
porta hepatis. Also with signs of collateral pathways about the
stomach. Appearance is largely similar when compared to the study [DATE].

Pancreas: Pancreatic atrophy and ductal dilation peripheral to a
pancreatic mass is similar to previous imaging.

Mass in the head measuring 3.0 x 2.8 cm previously approximately
x 3.7 cm. Interval stent placement. Some stranding around vessels in
the upper abdomen with similar appearance of portal venous
involvement and with further collateral development in the upper
abdomen. 360 degree involvement with high-grade narrowing of the
portal vein. Stranding about the SMA origin without soft tissue.

Spleen: Splenomegaly as before subtle low-density lesion in the
cephalad spleen is unchanged.

Adrenals/Urinary Tract: Adrenal glands are normal.

Symmetric renal enhancement without suspicious renal lesion. Small
cysts in the bilateral kidneys.

Urinary bladder is under distended limiting evaluation but with
smooth contours. No gross abnormality.

Stomach/Bowel: Collateral pathways about the upper abdomen involving
the stomach. No acute gastrointestinal process. Colonic
diverticulosis.

Vascular/Lymphatic: Calcified atheromatous plaque of the abdominal
aorta. No aneurysmal dilation. Retroaortic LEFT renal vein. No
discrete adenopathy in the upper abdomen.

Reproductive: No pelvic lymphadenopathy.  Post hysterectomy.

Other: No ascites.  No free air.

Musculoskeletal: Similar appearance of T12 compression fracture and
lower thoracic compression fractures. No acute musculoskeletal
process. No destructive bone findings.
IMPRESSION: 1. Interval stent placement in the common bile duct since previous
imaging there is resultant pneumobilia.
2. Decreasing size of pancreatic mass still with portal venous
involvement and extensive upper abdominal collaterals.
3. Subtle hypodense area in the RIGHT hepatic lobe may reflect
peripheral biliary duct (more branching pattern on coronal images)
distension and or mild cholangitis, consider attention on short
interval follow-up and correlation with symptoms. Other areas of
mild heterogeneity are present particularly in the LEFT hepatic lobe
following stent placement. Findings favor mild cholangitis.
4. Hepatic steatosis.
5. Subacute rib fractures of LEFT-sided sixth and seventh ribs.
6. Aortic atherosclerosis.

These results will be called to the ordering clinician or
representative by the Radiologist Assistant, and communication
documented in the PACS or [REDACTED].

Aortic Atherosclerosis ([FU]-[FU]).

## 2020-06-15 MED ORDER — IOHEXOL 300 MG/ML  SOLN
80.0000 mL | Freq: Once | INTRAMUSCULAR | Status: AC | PRN
  Administered 2020-06-15: 80 mL via INTRAVENOUS

## 2020-06-15 MED ORDER — SODIUM CHLORIDE 0.9% FLUSH
10.0000 mL | Freq: Once | INTRAVENOUS | Status: AC
Start: 2020-06-15 — End: 2020-06-15
  Administered 2020-06-15: 10 mL
  Filled 2020-06-15: qty 10

## 2020-06-15 MED ORDER — HEPARIN SOD (PORK) LOCK FLUSH 100 UNIT/ML IV SOLN
500.0000 [IU] | Freq: Once | INTRAVENOUS | Status: AC
  Administered 2020-06-15: 500 [IU]
  Filled 2020-06-15: qty 5

## 2020-06-15 NOTE — Addendum Note (Signed)
Addended by: Teodoro Spray on: 06/15/2020 11:44 AM   Modules accepted: Orders

## 2020-06-16 ENCOUNTER — Encounter: Payer: Self-pay | Admitting: Nurse Practitioner

## 2020-06-16 ENCOUNTER — Inpatient Hospital Stay: Payer: Medicare Other

## 2020-06-16 ENCOUNTER — Inpatient Hospital Stay (HOSPITAL_BASED_OUTPATIENT_CLINIC_OR_DEPARTMENT_OTHER): Payer: Medicare Other | Admitting: Nurse Practitioner

## 2020-06-16 VITALS — BP 143/61 | HR 86 | Temp 98.1°F | Resp 20 | Ht 63.0 in | Wt 120.8 lb

## 2020-06-16 DIAGNOSIS — Z79899 Other long term (current) drug therapy: Secondary | ICD-10-CM | POA: Diagnosis not present

## 2020-06-16 DIAGNOSIS — C250 Malignant neoplasm of head of pancreas: Secondary | ICD-10-CM

## 2020-06-16 DIAGNOSIS — Z5111 Encounter for antineoplastic chemotherapy: Secondary | ICD-10-CM | POA: Diagnosis present

## 2020-06-16 DIAGNOSIS — I10 Essential (primary) hypertension: Secondary | ICD-10-CM | POA: Diagnosis not present

## 2020-06-16 DIAGNOSIS — C251 Malignant neoplasm of body of pancreas: Secondary | ICD-10-CM | POA: Diagnosis present

## 2020-06-16 DIAGNOSIS — Z8616 Personal history of COVID-19: Secondary | ICD-10-CM | POA: Diagnosis not present

## 2020-06-16 DIAGNOSIS — G620 Drug-induced polyneuropathy: Secondary | ICD-10-CM | POA: Diagnosis not present

## 2020-06-16 DIAGNOSIS — Z5189 Encounter for other specified aftercare: Secondary | ICD-10-CM | POA: Diagnosis not present

## 2020-06-16 DIAGNOSIS — E119 Type 2 diabetes mellitus without complications: Secondary | ICD-10-CM | POA: Diagnosis not present

## 2020-06-16 DIAGNOSIS — T451X5A Adverse effect of antineoplastic and immunosuppressive drugs, initial encounter: Secondary | ICD-10-CM | POA: Diagnosis not present

## 2020-06-16 LAB — CANCER ANTIGEN 19-9: CA 19-9: 384 U/mL — ABNORMAL HIGH (ref 0–35)

## 2020-06-16 MED ORDER — SODIUM CHLORIDE 0.9 % IV SOLN
400.0000 mg/m2 | Freq: Once | INTRAVENOUS | Status: AC
  Administered 2020-06-16: 628 mg via INTRAVENOUS
  Filled 2020-06-16: qty 31.4

## 2020-06-16 MED ORDER — OXALIPLATIN CHEMO INJECTION 100 MG/20ML
85.0000 mg/m2 | Freq: Once | INTRAVENOUS | Status: AC
  Administered 2020-06-16: 135 mg via INTRAVENOUS
  Filled 2020-06-16: qty 10

## 2020-06-16 MED ORDER — SODIUM CHLORIDE 0.9 % IV SOLN
2400.0000 mg/m2 | INTRAVENOUS | Status: DC
  Administered 2020-06-16: 3750 mg via INTRAVENOUS
  Filled 2020-06-16: qty 75

## 2020-06-16 MED ORDER — SODIUM CHLORIDE 0.9 % IV SOLN
10.0000 mg | Freq: Once | INTRAVENOUS | Status: AC
  Administered 2020-06-16: 10 mg via INTRAVENOUS
  Filled 2020-06-16: qty 10

## 2020-06-16 MED ORDER — SODIUM CHLORIDE 0.9 % IV SOLN
150.0000 mg | Freq: Once | INTRAVENOUS | Status: AC
  Administered 2020-06-16: 150 mg via INTRAVENOUS
  Filled 2020-06-16: qty 150

## 2020-06-16 MED ORDER — TRAMADOL HCL 50 MG PO TABS: 50.0000 mg | ORAL_TABLET | Freq: Four times a day (QID) | ORAL | 0 refills | Status: DC | PRN

## 2020-06-16 MED ORDER — SODIUM CHLORIDE 0.9 % IV SOLN
125.0000 mg/m2 | Freq: Once | INTRAVENOUS | Status: AC
  Administered 2020-06-16: 200 mg via INTRAVENOUS
  Filled 2020-06-16: qty 10

## 2020-06-16 MED ORDER — DEXTROSE 5 % IV SOLN
Freq: Once | INTRAVENOUS | Status: AC
Start: 2020-06-16 — End: 2020-06-16
  Filled 2020-06-16: qty 250

## 2020-06-16 MED ORDER — PALONOSETRON HCL INJECTION 0.25 MG/5ML
0.2500 mg | Freq: Once | INTRAVENOUS | Status: AC
  Administered 2020-06-16: 0.25 mg via INTRAVENOUS
  Filled 2020-06-16: qty 5

## 2020-06-16 MED ORDER — ATROPINE SULFATE 1 MG/ML IJ SOLN
0.5000 mg | Freq: Once | INTRAMUSCULAR | Status: AC | PRN
  Administered 2020-06-16: 0.5 mg via INTRAVENOUS
  Filled 2020-06-16: qty 1

## 2020-06-16 MED ORDER — SODIUM CHLORIDE 0.9% FLUSH
10.0000 mL | INTRAVENOUS | Status: DC | PRN
  Administered 2020-06-16: 10 mL
  Filled 2020-06-16: qty 10

## 2020-06-16 NOTE — Patient Instructions (Signed)
Nancy Hawkins  Discharge Instructions: Thank you for choosing [facility] to provide your oncology and hematology care.   If you have a lab appointment with the [facility], please go directly to the [facility] and check in at the registration area.   Wear comfortable clothing and clothing appropriate for easy access to any Portacath or PICC line.   We strive to give you quality time with your provider. You may need to reschedule your appointment if you arrive late (15 or more minutes).  Arriving late affects you and other patients whose appointments are after yours.  Also, if you miss three or more appointments without notifying the office, you may be dismissed from the clinic at the provider's discretion.      For prescription refill requests, have your pharmacy contact our office and allow 72 hours for refills to be completed.    Today you received: oxaliplatin, irinotecan, leucovorin, fluoruracil.    To help prevent nausea and vomiting after your treatment, we encourage you to take your nausea medication as directed.  BELOW ARE SYMPTOMS THAT SHOULD BE REPORTED IMMEDIATELY: . *FEVER GREATER THAN 100.4 F (38 C) OR HIGHER . *CHILLS OR SWEATING . *NAUSEA AND VOMITING THAT IS NOT CONTROLLED WITH YOUR NAUSEA MEDICATION . *UNUSUAL SHORTNESS OF BREATH . *UNUSUAL BRUISING OR BLEEDING . *URINARY PROBLEMS (pain or burning when urinating, or frequent urination) . *BOWEL PROBLEMS (unusual diarrhea, constipation, pain near the anus) . TENDERNESS IN MOUTH AND THROAT WITH OR WITHOUT PRESENCE OF ULCERS (sore throat, sores in mouth, or a toothache) . UNUSUAL RASH, SWELLING OR PAIN  . UNUSUAL VAGINAL DISCHARGE OR ITCHING   Items with * indicate a potential emergency and should be followed up as soon as possible or go to the Emergency Department if any problems should occur.  Please show the CHEMOTHERAPY ALERT CARD or IMMUNOTHERAPY ALERT CARD at check-in to  the Emergency Department and triage nurse.  Should you have questions after your visit or need to cancel or reschedule your appointment, please contact [facility]  Dept: [phone]  and follow the prompts.  Office hours are 8:00 a.m. to 4:30 p.m. Monday - Friday. Please note that voicemails left after 4:00 p.m. may not be returned until the following business day.  We are closed weekends and major holidays. You have access to a nurse at all times for urgent questions. Please call the main number to the clinic Dept: [phone] and follow the prompts.   For any non-urgent questions, you may also contact your provider using MyChart. We now offer e-Visits for anyone 2 and older to request care online for non-urgent symptoms. For details visit [email or web address].   Also download the MyChart app! Go to the app store, search "MyChart", open the app, select [facility], and log in with your MyChart username and password.  Due to Covid, a mask is required upon entering the hospital/clinic. If you do not have a mask, one will be given to you upon arrival. For doctor visits, patients may have 1 support person aged 73 or older with them. For treatment visits, patients cannot have anyone with them due to current Covid guidelines and our immunocompromised population.

## 2020-06-16 NOTE — Progress Notes (Addendum)
Nancy Hawkins   Diagnosis: Pancreas cancer  INTERVAL HISTORY:   Nancy Hawkins returns as scheduled.  She completed cycle 6 FOLFIRINOX 06/02/2020.  She denies significant nausea and diarrhea.  No mouth sores.  Cold sensitivity lasted about 1 week.  No persistent neuropathy symptoms.  She noted an alteration in taste and smell for several days after chemotherapy.  During this time she consumed nutritional supplements.  Objective:  Vital signs in last 24 hours:  Blood pressure (!) 143/61, pulse 86, temperature 98.1 F (36.7 C), temperature source Oral, resp. rate 20, height 5\' 3"  (1.6 m), weight 120 lb 12.8 oz (54.8 kg), SpO2 100 %.    HEENT: No thrush or ulcers. Resp: Lungs clear bilaterally. Cardio: Regular rate and rhythm. GI: Abdomen soft and nontender.  No hepatomegaly.  No mass. Vascular: No leg edema. Neuro: Vibratory sense intact over the fingertips on the right hand, moderately decreased on the left hand. Skin: Palms without erythema. Port-A-Cath without erythema.   Lab Results:  Lab Results  Component Value Date   WBC 15.3 (H) 06/15/2020   HGB 8.3 (L) 06/15/2020   HCT 26.1 (L) 06/15/2020   MCV 98.1 06/15/2020   PLT 117 (L) 06/15/2020   NEUTROABS 12.2 (H) 06/15/2020    Imaging:  CT CHEST ABDOMEN PELVIS W CONTRAST  Result Date: 06/15/2020 CLINICAL DATA:  Restaging of pancreatic neoplasm in a 80 year old female. EXAM: CT CHEST, ABDOMEN, AND PELVIS WITH CONTRAST TECHNIQUE: Multidetector CT imaging of the chest, abdomen and pelvis was performed following the standard protocol during bolus administration of intravenous contrast. CONTRAST:  55mL OMNIPAQUE IOHEXOL 300 MG/ML  SOLN COMPARISON:  March 05, 2020. FINDINGS: CT CHEST FINDINGS Cardiovascular: Calcified atheromatous plaque and noncalcified plaque in the thoracic aorta similar to prior exams. Three-vessel coronary artery disease. Heart size is stable without pericardial effusion.  Central pulmonary vasculature is normal caliber. Mediastinum/Nodes: Esophagus mildly patulous with some ingested contrast media in the lumen of the esophagus. Central venous access device terminates in the mid superior vena cava. No thoracic inlet, axillary or mediastinal lymphadenopathy. No hilar lymphadenopathy. Lungs/Pleura: No sign of suspicious pulmonary nodule. No consolidation. No pleural effusion. Airways are patent. Mild basilar atelectasis. Musculoskeletal: See below for full musculoskeletal details. Osteopenia. Subacute rib fractures of LEFT-sided sixth and seventh ribs. CT ABDOMEN PELVIS FINDINGS Hepatobiliary: Post stent placement in the common bile duct since previous imaging there is resultant pneumobilia. Mildly heterogeneous enhancement of the LEFT hepatic lobe. Suspected background hepatic steatosis. No focal, tiny area of hypodensity seen in the RIGHT hepatic lobe 5 mm. This is not seen on previous imaging. 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 Flow remains present into the liver. Collateral pathways are present about the porta hepatis. Also with signs of collateral pathways about the stomach. Appearance is largely similar when compared to the study of March 05, 2020. Pancreas: Pancreatic atrophy and ductal dilation peripheral to a pancreatic mass is similar to previous imaging. Mass in the head measuring 3.0 x 2.8 cm previously approximately 3.5 x 3.7 cm. Interval stent placement. Some stranding around vessels in the upper abdomen with similar appearance of portal venous involvement and with further collateral development in the upper abdomen. 360 degree involvement with high-grade narrowing of the portal vein. Stranding about the SMA origin without soft tissue. Spleen: Splenomegaly as before subtle low-density lesion in the cephalad spleen is unchanged. Adrenals/Urinary Tract: Adrenal glands are normal. Symmetric  renal  enhancement without suspicious renal lesion. Small cysts in the bilateral kidneys. Urinary bladder is under distended limiting evaluation but with smooth contours. No gross abnormality. Stomach/Bowel: Collateral pathways about the upper abdomen involving the stomach. No acute gastrointestinal process. Colonic diverticulosis. Vascular/Lymphatic: Calcified atheromatous plaque of the abdominal aorta. No aneurysmal dilation. Retroaortic LEFT renal vein. No discrete adenopathy in the upper abdomen. Reproductive: No pelvic lymphadenopathy.  Post hysterectomy. Other: No ascites.  No free air. Musculoskeletal: Similar appearance of T12 compression fracture and lower thoracic compression fractures. No acute musculoskeletal process. No destructive bone findings. IMPRESSION: 1. Interval stent placement in the common bile duct since previous imaging there is resultant pneumobilia. 2. Decreasing size of pancreatic mass still with portal venous involvement and extensive upper abdominal collaterals. 3. Subtle hypodense area in the RIGHT hepatic lobe may reflect peripheral biliary duct (more branching pattern on coronal images) distension and or mild cholangitis, consider attention on short interval follow-up and correlation with symptoms. Other areas of mild heterogeneity are present particularly in the LEFT hepatic lobe following stent placement. Findings favor mild cholangitis. 4. Hepatic steatosis. 5. Subacute rib fractures of LEFT-sided sixth and seventh ribs. 6. Aortic atherosclerosis. These results will be called to the ordering clinician or representative by the Radiologist Assistant, and communication documented in the PACS or [employer]. Aortic Atherosclerosis (ICD10-I70.0). Electronically Signed   By: Zetta Bills M.D.   On: 06/15/2020 15:40    Medications: I have reviewed the patient's current medications.  Assessment/Plan: 1. Pancreas cancer, borderline resectable   CT abdomen/pelvis-inflammation  involving the mid body of the pancreas with peripancreatic fat stranding; possible 2.5 x 2.2 cm low-attenuation mass within the mid body of the pancreas, appearsto abut the celiac axis as well as the splenic vein.   MRI 11/18/2019-extensive inflammatory fat stranding about the pancreas with a thinly septated multicystic fluid signal lesion of the anterior pancreatic neck measuring 3.2 x 1.7 cm most consistent with a pancreatic pseudocyst; numerous additional small fluid signal lesions throughout the pancreatic parenchyma, the remaining lesions subcentimeter; central portion of pancreatic duct dilated measuring up to 8 mm without obvious obstructing lesion identified; trace ascites; trace bilateral pleural effusions and associated atelectasis or consolidation.  Colonoscopy 11/20/2019-5 polyps(resected and retrieved-multiple tubular adenomas, negative for high-grade dysplasia); 2 colonicangioectasias; diverticulosis in the entire examined colon; nonbleeding nonthrombosed external and internal hemorrhoids.   EGD/EUS 11/20/2019-few white nummular lesions in the entire esophagus (esophagus biopsy with no significant pathologic findings, no fungal elements); hematin found in the entire examined stomach; gastritis (stomach biopsy showed gastropathy/gastritis); 14 mm semisessile polyp found in the posterior wall of the gastric antrum (hyperplastic polyp);a lesion was identified in the pancreatic body staged T2 N0 MX.FNA of the pancreas lesion showed benign reactive/reparative changes, prominent inflammation.   EUS procedure 01/20/2020-masslike region identified in the pancreatic head, stage T2 N0 MX. 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  CTs chest/pelvis 01/29/2020-no evidence of metastatic disease  CT abdomen/pelvis 03/05/2020-increase in size of pancreas head/uncinate mass, marked narrowing of the adjacent portal vein, patchy consolidation  in the right lower lung, mild intrahepatic and extrahepatic biliary ductal dilatation  Cycle 1 FOLFOX 03/23/2020  Cycle 2 FOLFOX 04/06/2020, Emend added for delayed nausea after cycle 1  Cycle 3 FOLFIRINOX 04/20/2020  Cycle 4 FOLFIRINOX 05/04/2020, Udenyca  Cycle 5 FOLFIRINOX 05/18/2020, Udenyca  Cycle 6 FOLFIRINOX 06/02/2020, Udenyca  CTs 06/15/2020- decreased size of pancreatic mass, still with portal vein involvement and extensive upper abdominal collaterals.  Cycle 7 FOLFIRINOX 06/16/2020, Udenyca 2. Pancreatitis October 2021 3. Diabetes October 2021 4. Isolated episode of atrial fibrillation approximately 15 years ago 5. Hypertension 6. COVID-19 infection 7. Admission 03/05/2020 with biliary obstruction-ERCP 03/09/2020,biliary obstruction secondary to known pancreatic head mass found in the lower third of the main duct. The upper third of the main bile duct and left and right hepatic ducts and all intrahepatic branches moderately dilated secondary to the stricture. Biliary sphincterotomy performed. Covered metal biliary stent placed into the common bile duct. 8. Port-A-Cath placement 03/11/2020, Dr. [name] 9. Oxaliplatin neuropathy    Disposition: Nancy Hawkins appears stable.  She has completed 6 cycles of FOLFIRINOX.  Restaging CTs show the pancreas mass is smaller but still with portal vein involvement.  Plan to proceed with cycle 7 FOLFIRINOX today as scheduled.  Her case will be presented at an upcoming GI tumor conference.  We are also referring her back to Dr. [name].  We discussed the potential for worsening neuropathy symptoms.  She agrees to proceed.  We reviewed the CBC and chemistry panel from yesterday.  Labs adequate to proceed with treatment.  She has progressive anemia likely chemotherapy effect related to chemotherapy.  Sign/symptoms suggestive of progressive anemia reviewed with her at today's visit.  She will return for lab, follow-up, FOLFIRINOX in 2 weeks.  She will  contact the office in the interim with any problems.  Patient seen with Dr. Benay Spice.  CT images reviewed on the computer with Nancy Hawkins.    Ned Card ANP/GNP-BC   06/16/2020  8:29 AM This was a shared visit with Ned Card.  Nancy Hawkins was interviewed and examined.  We reviewed the restaging CT findings and images with her.  The pancreas mass is smaller, but there continues to be involvement of the portal vein.  No evidence of metastatic disease.  She will complete another cycle of FOLFIRINOX today.  I will present her case at the GI tumor conference on 06/24/2020.  We will refer her to Dr. [name] to consider whether she is a candidate for surgery.  We will refer her for definitive radiation if she is not a surgical candidate.  I was present for greater than 50% of today's visit.  I performed medical decision making.  Nancy Manson, MD

## 2020-06-18 ENCOUNTER — Inpatient Hospital Stay: Payer: Medicare Other

## 2020-06-18 ENCOUNTER — Other Ambulatory Visit: Payer: Self-pay

## 2020-06-18 VITALS — BP 136/46 | HR 73 | Temp 98.2°F | Resp 20

## 2020-06-18 DIAGNOSIS — C250 Malignant neoplasm of head of pancreas: Secondary | ICD-10-CM

## 2020-06-18 DIAGNOSIS — Z5111 Encounter for antineoplastic chemotherapy: Secondary | ICD-10-CM | POA: Diagnosis not present

## 2020-06-18 MED ORDER — PEGFILGRASTIM-CBQV 6 MG/0.6ML ~~LOC~~ SOSY
6.0000 mg | PREFILLED_SYRINGE | Freq: Once | SUBCUTANEOUS | Status: AC
  Administered 2020-06-18: 6 mg via SUBCUTANEOUS

## 2020-06-18 MED ORDER — HEPARIN SOD (PORK) LOCK FLUSH 100 UNIT/ML IV SOLN
500.0000 [IU] | Freq: Once | INTRAVENOUS | Status: AC | PRN
  Administered 2020-06-18: 500 [IU]
  Filled 2020-06-18: qty 5

## 2020-06-18 MED ORDER — SODIUM CHLORIDE 0.9% FLUSH
10.0000 mL | INTRAVENOUS | Status: DC | PRN
  Administered 2020-06-18: 10 mL
  Filled 2020-06-18: qty 10

## 2020-06-18 NOTE — Patient Instructions (Signed)
[facility]  Discharge Instructions: Thank you for choosing [facility] to provide your oncology and hematology care.   If you have a lab appointment with the [facility], please go directly to the [facility] and check in at the registration area.   Wear comfortable clothing and clothing appropriate for easy access to any Portacath or PICC line.   We strive to give you quality time with your provider. You may need to reschedule your appointment if you arrive late (15 or more minutes).  Arriving late affects you and other patients whose appointments are after yours.  Also, if you miss three or more appointments without notifying the office, you may be dismissed from the clinic at the provider's discretion.      For prescription refill requests, have your pharmacy contact our office and allow 72 hours for refills to be completed.    Today you received the following chemotherapy: discontinuation from 74fu pump and udenyca   To help prevent nausea and vomiting after your treatment, we encourage you to take your nausea medication as directed.  BELOW ARE SYMPTOMS THAT SHOULD BE REPORTED IMMEDIATELY: . *FEVER GREATER THAN 100.4 F (38 C) OR HIGHER . *CHILLS OR SWEATING . *NAUSEA AND VOMITING THAT IS NOT CONTROLLED WITH YOUR NAUSEA MEDICATION . *UNUSUAL SHORTNESS OF BREATH . *UNUSUAL BRUISING OR BLEEDING . *URINARY PROBLEMS (pain or burning when urinating, or frequent urination) . *BOWEL PROBLEMS (unusual diarrhea, constipation, pain near the anus) . TENDERNESS IN MOUTH AND THROAT WITH OR WITHOUT PRESENCE OF ULCERS (sore throat, sores in mouth, or a toothache) . UNUSUAL RASH, SWELLING OR PAIN  . UNUSUAL VAGINAL DISCHARGE OR ITCHING   Items with * indicate a potential emergency and should be followed up as soon as possible or go to the Emergency Department if any problems should occur.  Please show the CHEMOTHERAPY ALERT CARD or IMMUNOTHERAPY ALERT  CARD at check-in to the Emergency Department and triage nurse.  Should you have questions after your visit or need to cancel or reschedule your appointment, please contact [facility]  Dept: [phone]  and follow the prompts.  Office hours are 8:00 a.m. to 4:30 p.m. Monday - Friday. Please note that voicemails left after 4:00 p.m. may not be returned until the following business day.  We are closed weekends and major holidays. You have access to a nurse at all times for urgent questions. Please call the main number to the clinic Dept: [phone] and follow the prompts.   For any non-urgent questions, you may also contact your provider using MyChart. We now offer e-Visits for anyone 56 and older to request care online for non-urgent symptoms. For details visit [email or web address].   Also download the MyChart app! Go to the app store, search "MyChart", open the app, select , and log in with your MyChart username and password.  Due to Covid, a mask is required upon entering the hospital/clinic. If you do not have a mask, one will be given to you upon arrival. For doctor visits, patients may have 1 support person aged 63 or older with them. For treatment visits, patients cannot have anyone with them due to current Covid guidelines and our immunocompromised population.   Fluorouracil, 5-FU injection What is this medicine? FLUOROURACIL, 5-FU (flure oh YOOR a sil) is a chemotherapy drug. It slows the growth of cancer cells. This medicine is used to treat many types of cancer like breast cancer, colon or  cancer, pancreatic cancer, and stomach cancer. This medicine may be used for other purposes; ask your health care provider or pharmacist if you have questions. COMMON BRAND NAME(S): Adrucil What should I tell my health care provider before I take this medicine? They need to know if you have any of these conditions:  blood disorders  dihydropyrimidine  dehydrogenase (DPD) deficiency  infection (especially a virus infection such as chickenpox, cold sores, or herpes)  kidney disease  liver disease  malnourished, poor nutrition  recent or ongoing radiation therapy  an unusual or allergic reaction to fluorouracil, other chemotherapy, other medicines, foods, dyes, or preservatives  pregnant or trying to get pregnant  breast-feeding How should I use this medicine? This drug is given as an infusion or injection into a vein. It is administered in a hospital or clinic by a specially trained health care professional. Talk to your pediatrician regarding the use of this medicine in children. Special care may be needed. Overdosage: If you think you have taken too much of this medicine contact a poison control center or emergency room at once. NOTE: This medicine is only for you. Do not share this medicine with others. What if I miss a dose? It is important not to miss your dose. Call your doctor or health care professional if you are unable to keep an appointment. What may interact with this medicine? Do not take this medicine with any of the following medications:  live virus vaccines This medicine may also interact with the following medications:  medicines that treat or prevent blood clots like warfarin, enoxaparin, and dalteparin This list may not describe all possible interactions. Give your health care provider a list of all the medicines, herbs, non-prescription drugs, or dietary supplements you use. Also tell them if you smoke, drink alcohol, or use illegal drugs. Some items may interact with your medicine. What should I watch for while using this medicine? Visit your doctor for checks on your progress. This drug may make you feel generally unwell. This is not uncommon, as chemotherapy can affect healthy cells as well as cancer cells. Report any side effects. Continue your course of treatment even though you feel ill unless your doctor  tells you to stop. In some cases, you may be given additional medicines to help with side effects. Follow all directions for their use. Call your doctor or health care professional for advice if you get a fever, chills or sore throat, or other symptoms of a cold or flu. Do not treat yourself. This drug decreases your body's ability to fight infections. Try to avoid being around people who are sick. This medicine may increase your risk to bruise or bleed. Call your doctor or health care professional if you notice any unusual bleeding. Be careful brushing and flossing your teeth or using a toothpick because you may get an infection or bleed more easily. If you have any dental work done, tell your dentist you are receiving this medicine. Avoid taking products that contain aspirin, acetaminophen, ibuprofen, naproxen, or ketoprofen unless instructed by your doctor. These medicines may hide a fever. Do not become pregnant while taking this medicine. Women should inform their doctor if they wish to become pregnant or think they might be pregnant. There is a potential for serious side effects to an unborn child. Talk to your health care professional or pharmacist for more information. Do not breast-feed an infant while taking this medicine. Men should inform their doctor if they wish to father a child.   child. This medicine may lower sperm counts. Do not treat diarrhea with over the counter products. Contact your doctor if you have diarrhea that lasts more than 2 days or if it is severe and watery. This medicine can make you more sensitive to the sun. Keep out of the sun. If you cannot avoid being in the sun, wear protective clothing and use sunscreen. Do not use sun lamps or tanning beds/booths. What side effects may I notice from receiving this medicine? Side effects that you should report to your doctor or health care professional as soon as possible:  allergic reactions like skin rash, itching or hives, swelling of  the face, lips, or tongue  low blood counts - this medicine may decrease the number of white blood cells, red blood cells and platelets. You may be at increased risk for infections and bleeding.  signs of infection - fever or chills, cough, sore throat, pain or difficulty passing urine  signs of decreased platelets or bleeding - bruising, pinpoint red spots on the skin, black, tarry stools, blood in the urine  signs of decreased red blood cells - unusually weak or tired, fainting spells, lightheadedness  breathing problems  changes in vision  chest pain  mouth sores  nausea and vomiting  pain, swelling, redness at site where injected  pain, tingling, numbness in the hands or feet  redness, swelling, or sores on hands or feet  stomach pain  unusual bleeding Side effects that usually do not require medical attention (report to your doctor or health care professional if they continue or are bothersome):  changes in finger or toe nails  diarrhea  dry or itchy skin  hair loss  headache  loss of appetite  sensitivity of eyes to the light  stomach upset  unusually teary eyes This list may not describe all possible side effects. Call your doctor for medical advice about side effects. You may report side effects to FDA at 1-800-FDA-1088. Where should I keep my medicine? This drug is given in a hospital or clinic and will not be stored at home. NOTE: This sheet is a summary. It may not cover all possible information. If you have questions about this medicine, talk to your doctor, pharmacist, or health care provider.  2021 Elsevier/Gold Standard (2018-12-25 15:00:03)  Pegfilgrastim injection What is this medicine? PEGFILGRASTIM (PEG fil gra stim) is a long-acting granulocyte colony-stimulating factor that stimulates the growth of neutrophils, a type of white blood cell important in the body's fight against infection. It is used to reduce the incidence of fever and  infection in patients with certain types of cancer who are receiving chemotherapy that affects the bone marrow, and to increase survival after being exposed to high doses of radiation. This medicine may be used for other purposes; ask your health care provider or pharmacist if you have questions. COMMON BRAND NAME(S): Rexene Edison, Ziextenzo What should I tell my health care provider before I take this medicine? They need to know if you have any of these conditions:  kidney disease  latex allergy  ongoing radiation therapy  sickle cell disease  skin reactions to acrylic adhesives (On-Body Injector only)  an unusual or allergic reaction to pegfilgrastim, filgrastim, other medicines, foods, dyes, or preservatives  pregnant or trying to get pregnant  breast-feeding How should I use this medicine? This medicine is for injection under the skin. If you get this medicine at home, you will be taught how to prepare and give the pre-filled  syringe or how to use the On-body Injector. Refer to the patient Instructions for Use for detailed instructions. Use exactly as directed. Tell your healthcare provider immediately if you suspect that the On-body Injector may not have performed as intended or if you suspect the use of the On-body Injector resulted in a missed or partial dose. It is important that you put your used needles and syringes in a special sharps container. Do not put them in a trash can. If you do not have a sharps container, call your pharmacist or healthcare provider to get one. Talk to your pediatrician regarding the use of this medicine in children. While this drug may be prescribed for selected conditions, precautions do apply. Overdosage: If you think you have taken too much of this medicine contact a poison control center or emergency room at once. NOTE: This medicine is only for you. Do not share this medicine with others. What if I miss a dose? It is  important not to miss your dose. Call your doctor or health care professional if you miss your dose. If you miss a dose due to an On-body Injector failure or leakage, a new dose should be administered as soon as possible using a single prefilled syringe for manual use. What may interact with this medicine? Interactions have not been studied. This list may not describe all possible interactions. Give your health care provider a list of all the medicines, herbs, non-prescription drugs, or dietary supplements you use. Also tell them if you smoke, drink alcohol, or use illegal drugs. Some items may interact with your medicine. What should I watch for while using this medicine? Your condition will be monitored carefully while you are receiving this medicine. You may need blood work done while you are taking this medicine. Talk to your health care provider about your risk of cancer. You may be more at risk for certain types of cancer if you take this medicine. If you are going to need a MRI, CT scan, or other procedure, tell your doctor that you are using this medicine (On-Body Injector only). What side effects may I notice from receiving this medicine? Side effects that you should report to your doctor or health care professional as soon as possible:  allergic reactions (skin rash, itching or hives, swelling of the face, lips, or tongue)  back pain  dizziness  fever  pain, redness, or irritation at site where injected  pinpoint red spots on the skin  red or dark-brown urine  shortness of breath or breathing problems  stomach or side pain, or pain at the shoulder  swelling  tiredness  trouble passing urine or change in the amount of urine  unusual bruising or bleeding Side effects that usually do not require medical attention (report to your doctor or health care professional if they continue or are bothersome):  bone pain  muscle pain This list may not describe all possible side  effects. Call your doctor for medical advice about side effects. You may report side effects to FDA at 1-800-FDA-1088. Where should I keep my medicine? Keep out of the reach of children. If you are using this medicine at home, you will be instructed on how to store it. Throw away any unused medicine after the expiration date on the label. NOTE: This sheet is a summary. It may not cover all possible information. If you have questions about this medicine, talk to your doctor, pharmacist, or health care provider.  2021 Elsevier/Gold Standard (2019-02-15 13:20:51)

## 2020-06-19 ENCOUNTER — Other Ambulatory Visit: Payer: Self-pay | Admitting: Nurse Practitioner

## 2020-06-19 DIAGNOSIS — C250 Malignant neoplasm of head of pancreas: Secondary | ICD-10-CM

## 2020-06-22 ENCOUNTER — Other Ambulatory Visit: Payer: Self-pay | Admitting: Oncology

## 2020-06-23 ENCOUNTER — Telehealth: Payer: Self-pay

## 2020-06-23 NOTE — Telephone Encounter (Signed)
TC from Pt stating that she is having a hard time bouncing back from treatment. Informed Pt that as she gets further into treatment recovery time will be different. Pt stated that she was feeling a little light headed ask Pt denied any nausea or vomiting but did state she was having diarrhea which is subsiding. Offered her to come in to see a provider which she stated she was going to see her surgeon today. Reinforced Pt to increase her fluid intake as well as eating. Recommended Gatorade or Pedialyte to for electrolyte loss from the diarrhea. Informed Pt that if she wished to still come in after she see's her surgeon she can give [place] a call. Pt verbalized understanding

## 2020-06-24 ENCOUNTER — Other Ambulatory Visit: Payer: Self-pay

## 2020-06-24 NOTE — Progress Notes (Signed)
The proposed treatment discussed in conference is for discussion purposes only and is not a binding recommendation.  The patients have not been physically examined, or presented with their treatment options.  Therefore, final treatment plans cannot be decided.   

## 2020-06-28 ENCOUNTER — Other Ambulatory Visit: Payer: Self-pay | Admitting: Oncology

## 2020-07-01 ENCOUNTER — Inpatient Hospital Stay: Payer: Medicare Other

## 2020-07-01 ENCOUNTER — Other Ambulatory Visit: Payer: Self-pay

## 2020-07-01 ENCOUNTER — Inpatient Hospital Stay (HOSPITAL_BASED_OUTPATIENT_CLINIC_OR_DEPARTMENT_OTHER): Payer: Medicare Other | Admitting: Oncology

## 2020-07-01 VITALS — BP 142/63 | HR 84 | Temp 97.9°F | Resp 19 | Ht 63.0 in | Wt 116.6 lb

## 2020-07-01 DIAGNOSIS — I10 Essential (primary) hypertension: Secondary | ICD-10-CM

## 2020-07-01 DIAGNOSIS — C250 Malignant neoplasm of head of pancreas: Secondary | ICD-10-CM | POA: Diagnosis not present

## 2020-07-01 DIAGNOSIS — Z5111 Encounter for antineoplastic chemotherapy: Secondary | ICD-10-CM | POA: Diagnosis not present

## 2020-07-01 LAB — CBC WITH DIFFERENTIAL (CANCER CENTER ONLY)
Abs Immature Granulocytes: 0.1 10*3/uL — ABNORMAL HIGH (ref 0.00–0.07)
Basophils Absolute: 0.1 10*3/uL (ref 0.0–0.1)
Basophils Relative: 1 %
Eosinophils Absolute: 0.1 10*3/uL (ref 0.0–0.5)
Eosinophils Relative: 2 %
HCT: 27.6 % — ABNORMAL LOW (ref 36.0–46.0)
Hemoglobin: 8.9 g/dL — ABNORMAL LOW (ref 12.0–15.0)
Immature Granulocytes: 1 %
Lymphocytes Relative: 11 %
Lymphs Abs: 1 10*3/uL (ref 0.7–4.0)
MCH: 32.4 pg (ref 26.0–34.0)
MCHC: 32.2 g/dL (ref 30.0–36.0)
MCV: 100.4 fL — ABNORMAL HIGH (ref 80.0–100.0)
Monocytes Absolute: 0.6 10*3/uL (ref 0.1–1.0)
Monocytes Relative: 7 %
Neutro Abs: 7 10*3/uL (ref 1.7–7.7)
Neutrophils Relative %: 78 %
Platelet Count: 146 10*3/uL — ABNORMAL LOW (ref 150–400)
RBC: 2.75 MIL/uL — ABNORMAL LOW (ref 3.87–5.11)
RDW: 16.3 % — ABNORMAL HIGH (ref 11.5–15.5)
WBC Count: 9 10*3/uL (ref 4.0–10.5)
nRBC: 0 % (ref 0.0–0.2)

## 2020-07-01 LAB — CMP (CANCER CENTER ONLY)
ALT: 17 U/L (ref 0–44)
AST: 13 U/L — ABNORMAL LOW (ref 15–41)
Albumin: 3.6 g/dL (ref 3.5–5.0)
Alkaline Phosphatase: 163 U/L — ABNORMAL HIGH (ref 38–126)
Anion gap: 10 (ref 5–15)
BUN: 13 mg/dL (ref 8–23)
CO2: 21 mmol/L — ABNORMAL LOW (ref 22–32)
Calcium: 8.6 mg/dL — ABNORMAL LOW (ref 8.9–10.3)
Chloride: 110 mmol/L (ref 98–111)
Creatinine: 0.34 mg/dL — ABNORMAL LOW (ref 0.44–1.00)
GFR, Estimated: >60 mL/min (ref >60)
Glucose, Bld: 158 mg/dL — ABNORMAL HIGH (ref 70–99)
Potassium: 3.6 mmol/L (ref 3.5–5.1)
Sodium: 141 mmol/L (ref 135–145)
Total Bilirubin: 0.4 mg/dL (ref 0.3–1.2)
Total Protein: 6.3 g/dL — ABNORMAL LOW (ref 6.5–8.1)

## 2020-07-01 LAB — SAMPLE TO BLOOD BANK

## 2020-07-01 MED ORDER — SODIUM CHLORIDE 0.9 % IV SOLN
150.0000 mg | Freq: Once | INTRAVENOUS | Status: AC
  Administered 2020-07-01: 150 mg via INTRAVENOUS
  Filled 2020-07-01: qty 5

## 2020-07-01 MED ORDER — DEXTROSE 5 % IV SOLN
Freq: Once | INTRAVENOUS | Status: AC
  Filled 2020-07-01: qty 250

## 2020-07-01 MED ORDER — SODIUM CHLORIDE 0.9 % IV SOLN
125.0000 mg/m2 | Freq: Once | INTRAVENOUS | Status: AC
  Administered 2020-07-01: 200 mg via INTRAVENOUS
  Filled 2020-07-01: qty 10

## 2020-07-01 MED ORDER — SODIUM CHLORIDE 0.9 % IV SOLN
400.0000 mg/m2 | Freq: Once | INTRAVENOUS | Status: AC
  Administered 2020-07-01: 628 mg via INTRAVENOUS
  Filled 2020-07-01: qty 31.4

## 2020-07-01 MED ORDER — SODIUM CHLORIDE 0.9 % IV SOLN
10.0000 mg | Freq: Once | INTRAVENOUS | Status: AC
  Administered 2020-07-01: 10 mg via INTRAVENOUS
  Filled 2020-07-01: qty 1

## 2020-07-01 MED ORDER — SODIUM CHLORIDE 0.9 % IV SOLN
2400.0000 mg/m2 | INTRAVENOUS | Status: DC
  Administered 2020-07-01: 3750 mg via INTRAVENOUS
  Filled 2020-07-01: qty 75

## 2020-07-01 MED ORDER — PALONOSETRON HCL INJECTION 0.25 MG/5ML
0.2500 mg | Freq: Once | INTRAVENOUS | Status: AC
  Administered 2020-07-01: 0.25 mg via INTRAVENOUS
  Filled 2020-07-01: qty 5

## 2020-07-01 MED ORDER — ATROPINE SULFATE 1 MG/ML IJ SOLN
0.5000 mg | Freq: Once | INTRAMUSCULAR | Status: AC | PRN
  Administered 2020-07-01: 0.5 mg via INTRAVENOUS
  Filled 2020-07-01: qty 1

## 2020-07-01 NOTE — Progress Notes (Signed)
[facility] OFFICE PROGRESS NOTE   Diagnosis: Pancreas cancer  INTERVAL HISTORY:   Nancy Hawkins completed another cycle FOLFIRINOX on 06/16/2020.  She had an episode of total body "numbness "and weakness on day 5.  This resolved.  She had prolonged malaise following this cycle of chemotherapy.  She continues to have mild numbness in the extremities.  Her pain remains improved.  She is here today with her daughter. She saw Dr. [name] on 06/23/2020.  The tumor is unresectable secondary to vascular involvement. Objective:  Vital signs in last 24 hours:  Blood pressure (!) 142/63, pulse 84, temperature 97.9 F (36.6 C), temperature source Oral, resp. rate 19, height 5\' 3"  (1.6 m), weight 116 lb 9.6 oz (52.9 kg), SpO2 100 %.    HEENT: No thrush or ulcers Resp: Lungs clear bilaterally Cardio: Regular rate and rhythm GI: No mass, no hepatosplenomegaly, nontender Vascular: No leg edema Neuro: Moderate loss of vibratory sense at the fingertips bilaterally    Portacath/PICC-without erythema  Lab Results:  Lab Results  Component Value Date   WBC 9.0 07/01/2020   HGB 8.9 (L) 07/01/2020   HCT 27.6 (L) 07/01/2020   MCV 100.4 (H) 07/01/2020   PLT 146 (L) 07/01/2020   NEUTROABS 7.0 07/01/2020    CMP  Lab Results  Component Value Date   NA 141 07/01/2020   K 3.6 07/01/2020   CL 110 07/01/2020   CO2 21 (L) 07/01/2020   GLUCOSE 158 (H) 07/01/2020   BUN 13 07/01/2020   CREATININE 0.34 (L) 07/01/2020   CALCIUM 8.6 (L) 07/01/2020   PROT 6.3 (L) 07/01/2020   ALBUMIN 3.6 07/01/2020   AST 13 (L) 07/01/2020   ALT 17 07/01/2020   ALKPHOS 163 (H) 07/01/2020   BILITOT 0.4 07/01/2020   GFRNONAA >60 07/01/2020   GFRAA 109 03/03/2020    Medications: I have reviewed the patient's current medications.   Assessment/Plan: 1. Pancreas cancer, borderline resectable   CT abdomen/pelvis-inflammation involving the mid body of the pancreas with peripancreatic fat stranding;  possible 2.5 x 2.2 cm low-attenuation mass within the mid body of the pancreas, appearsto abut the celiac axis as well as the splenic vein.   MRI 11/18/2019-extensive inflammatory fat stranding about the pancreas with a thinly septated multicystic fluid signal lesion of the anterior pancreatic neck measuring 3.2 x 1.7 cm most consistent with a pancreatic pseudocyst; numerous additional small fluid signal lesions throughout the pancreatic parenchyma, the remaining lesions subcentimeter; central portion of pancreatic duct dilated measuring up to 8 mm without obvious obstructing lesion identified; trace ascites; trace bilateral pleural effusions and associated atelectasis or consolidation.  Colonoscopy 11/20/2019-5 polyps(resected and retrieved-multiple tubular adenomas, negative for high-grade dysplasia); 2 colonicangioectasias; diverticulosis in the entire examined colon; nonbleeding nonthrombosed external and internal hemorrhoids.   EGD/EUS 11/20/2019-few white nummular lesions in the entire esophagus (esophagus biopsy with no significant pathologic findings, no fungal elements); hematin found in the entire examined stomach; gastritis (stomach biopsy showed gastropathy/gastritis); 14 mm semisessile polyp found in the posterior wall of the gastric antrum (hyperplastic polyp);a lesion was identified in the pancreatic body staged T2 N0 MX.FNA of the pancreas lesion showed benign reactive/reparative changes, prominent inflammation.   EUS procedure 01/20/2020-masslike region identified in the pancreatic head, stage T2 N0 MX. 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  CTs chest/pelvis 01/29/2020-no evidence of metastatic disease  CT abdomen/pelvis 03/05/2020-increase in size of pancreas head/uncinate mass, marked narrowing of the adjacent portal vein, patchy consolidation in  the right lower lung, mild intrahepatic and extrahepatic biliary  ductal dilatation  Cycle 1 FOLFOX 03/23/2020  Cycle 2 FOLFOX 04/06/2020, Emend added for delayed nausea after cycle 1  Cycle 3 FOLFIRINOX 04/20/2020  Cycle 4 FOLFIRINOX 05/04/2020, Udenyca  Cycle 5 FOLFIRINOX 05/18/2020, Udenyca  Cycle 6 FOLFIRINOX 06/02/2020, Udenyca  CTs 06/15/2020- decreased size of pancreatic mass, still with portal vein involvement and extensive upper abdominal collaterals.  Cycle 7 FOLFIRINOX 06/16/2020, Udenyca  Cycle 8 FOLFIRINOX 07/01/2020, oxaliplatin held secondary to neuropathy, Udenyca 2. Pancreatitis October 2021 3. Diabetes October 2021 4. Isolated episode of atrial fibrillation approximately 15 years ago 5. Hypertension 6. COVID-19 infection 7. Admission 03/05/2020 with biliary obstruction-ERCP 03/09/2020,biliary obstruction secondary to known pancreatic head mass found in the lower third of the main duct. The upper third of the main bile duct and left and right hepatic ducts and all intrahepatic branches moderately dilated secondary to the stricture. Biliary sphincterotomy performed. Covered metal biliary stent placed into the common bile duct. 8. Port-A-Cath placement 03/11/2020, Dr. [name] 9. Oxaliplatin neuropathy     Disposition: Nancy Hawkins has completed 7 cycles of FOLFIRINOX.  The pancreas tumor is smaller, the CA 19-9 is lower, and her pain has improved.  However the tumor is felt to be unresectable.  Her case was presented at the GI tumor conference last week.  Pancreas radiation was recommended.  I discussed treatment options with Nancy Hawkins and her daughter.  She will complete a final cycle of chemotherapy today.  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  We will refer her to Dr. Lisbeth Renshaw to consider conventional radiation or stereotactic radiosurgery.  We will prescribe concurrent capecitabine if Dr. Lisbeth Renshaw recommends conventional radiation.  Nancy Hawkins will return for an office visit in 3 weeks.  [name],  MD  07/01/2020  10:17 AM

## 2020-07-01 NOTE — Patient Instructions (Signed)
Nancy Hawkins    Discharge Instructions:  Thank you for choosing [facility] to provide your oncology and hematology care.   If you have a lab appointment with the [facility], please go directly to the [facility] and check in at the registration area.   Wear comfortable clothing and clothing appropriate for easy access to any Portacath or PICC line.   We strive to give you quality time with your provider. You may need to reschedule your appointment if you arrive late (15 or more minutes).  Arriving late affects you and other patients whose appointments are after yours.  Also, if you miss three or more appointments without notifying the office, you may be dismissed from the clinic at the provider's discretion.      For prescription refill requests, have your pharmacy contact our office and allow 72 hours for refills to be completed.    Today you received the following chemotherapy and/or immunotherapy agents Irinotecan (CAMPTOSAR), Leucovorin & Flourouracil (ADRUCIL).    To help prevent nausea and vomiting after your treatment, we encourage you to take your nausea medication as directed.  BELOW ARE SYMPTOMS THAT SHOULD BE REPORTED IMMEDIATELY: . *FEVER GREATER THAN 100.4 F (38 C) OR HIGHER . *CHILLS OR SWEATING . *NAUSEA AND VOMITING THAT IS NOT CONTROLLED WITH YOUR NAUSEA MEDICATION . *UNUSUAL SHORTNESS OF BREATH . *UNUSUAL BRUISING OR BLEEDING . *URINARY PROBLEMS (pain or burning when urinating, or frequent urination) . *BOWEL PROBLEMS (unusual diarrhea, constipation, pain near the anus) . TENDERNESS IN MOUTH AND THROAT WITH OR WITHOUT PRESENCE OF ULCERS (sore throat, sores in mouth, or a toothache) . UNUSUAL RASH, SWELLING OR PAIN  . UNUSUAL VAGINAL DISCHARGE OR ITCHING   Items with * indicate a potential emergency and should be followed up as soon as possible or go to the Emergency Department if any problems should occur.  Please show  the CHEMOTHERAPY ALERT CARD or IMMUNOTHERAPY ALERT CARD at check-in to the Emergency Department and triage nurse.  Should you have questions after your visit or need to cancel or reschedule your appointment, please contact [facility]  Dept: [phone]  and follow the prompts.  Office hours are 8:00 a.m. to 4:30 p.m. Monday - Friday. Please note that voicemails left after 4:00 p.m. may not be returned until the following business day.  We are closed weekends and major holidays. You have access to a nurse at all times for urgent questions. Please call the main number to the clinic Dept: [phone] and follow the prompts.   For any non-urgent questions, you may also contact your provider using MyChart. We now offer e-Visits for anyone 78 and older to request care online for non-urgent symptoms. For details visit [email or web address].   Also download the MyChart app! Go to the app store, search "MyChart", open the app, select [facility], and log in with your MyChart username and password.  Due to Covid, a mask is required upon entering the hospital/clinic. If you do not have a mask, one will be given to you upon arrival. For doctor visits, patients may have 1 support person aged 62 or older with them. For treatment visits, patients cannot have anyone with them due to current Covid guidelines and our immunocompromised population.   Irinotecan injection What is this medicine? IRINOTECAN (ir in oh TEE kan ) is a chemotherapy drug. It is used to treat colon and rectal cancer. This medicine may be used for other  purposes; ask your health care provider or pharmacist if you have questions. COMMON BRAND NAME(S): Camptosar What should I tell my health care provider before I take this medicine? They need to know if you have any of these conditions:  dehydration  diarrhea  infection (especially a virus infection such as chickenpox, cold sores, or herpes)  liver  disease  low blood counts, like low white cell, platelet, or red cell counts  low levels of calcium, magnesium, or potassium in the blood  recent or ongoing radiation therapy  an unusual or allergic reaction to irinotecan, other medicines, foods, dyes, or preservatives  pregnant or trying to get pregnant  breast-feeding How should I use this medicine? This drug is given as an infusion into a vein. It is administered in a hospital or clinic by a specially trained health care professional. Talk to your pediatrician regarding the use of this medicine in children. Special care may be needed. Overdosage: If you think you have taken too much of this medicine contact a poison control center or emergency room at once. NOTE: This medicine is only for you. Do not share this medicine with others. What if I miss a dose? It is important not to miss your dose. Call your doctor or health care professional if you are unable to keep an appointment. What may interact with this medicine? Do not take this medicine with any of the following medications:  cobicistat  itraconazole This medicine may interact with the following medications:  antiviral medicines for HIV or AIDS  certain antibiotics like rifampin or rifabutin  certain medicines for fungal infections like ketoconazole, posaconazole, and voriconazole  certain medicines for seizures like carbamazepine, phenobarbital, phenotoin  clarithromycin  gemfibrozil  nefazodone  St. John's Wort This list may not describe all possible interactions. Give your health care provider a list of all the medicines, herbs, non-prescription drugs, or dietary supplements you use. Also tell them if you smoke, drink alcohol, or use illegal drugs. Some items may interact with your medicine. What should I watch for while using this medicine? Your condition will be monitored carefully while you are receiving this medicine. You will need important blood work done  while you are taking this medicine. This drug may make you feel generally unwell. This is not uncommon, as chemotherapy can affect healthy cells as well as cancer cells. Report any side effects. Continue your course of treatment even though you feel ill unless your doctor tells you to stop. In some cases, you may be given additional medicines to help with side effects. Follow all directions for their use. You may get drowsy or dizzy. Do not drive, use machinery, or do anything that needs mental alertness until you know how this medicine affects you. Do not stand or sit up quickly, especially if you are an older patient. This reduces the risk of dizzy or fainting spells. Call your health care professional for advice if you get a fever, chills, or sore throat, or other symptoms of a cold or flu. Do not treat yourself. This medicine decreases your body's ability to fight infections. Try to avoid being around people who are sick. Avoid taking products that contain aspirin, acetaminophen, ibuprofen, naproxen, or ketoprofen unless instructed by your doctor. These medicines may hide a fever. This medicine may increase your risk to bruise or bleed. Call your doctor or health care professional if you notice any unusual bleeding. Be careful brushing and flossing your teeth or using a toothpick because you may get  an infection or bleed more easily. If you have any dental work done, tell your dentist you are receiving this medicine. Do not become pregnant while taking this medicine or for 6 months after stopping it. Women should inform their health care professional if they wish to become pregnant or think they might be pregnant. Men should not father a child while taking this medicine and for 3 months after stopping it. There is potential for serious side effects to an unborn child. Talk to your health care professional for more information. Do not breast-feed an infant while taking this medicine or for 7 days after  stopping it. This medicine has caused ovarian failure in some women. This medicine may make it more difficult to get pregnant. Talk to your health care professional if you are concerned about your fertility. This medicine has caused decreased sperm counts in some men. This may make it more difficult to father a child. Talk to your health care professional if you are concerned about your fertility. What side effects may I notice from receiving this medicine? Side effects that you should report to your doctor or health care professional as soon as possible:  allergic reactions like skin rash, itching or hives, swelling of the face, lips, or tongue  chest pain  diarrhea  flushing, runny nose, sweating during infusion  low blood counts - this medicine may decrease the number of white blood cells, red blood cells and platelets. You may be at increased risk for infections and bleeding.  nausea, vomiting  pain, swelling, warmth in the leg  signs of decreased platelets or bleeding - bruising, pinpoint red spots on the skin, black, tarry stools, blood in the urine  signs of infection - fever or chills, cough, sore throat, pain or difficulty passing urine  signs of decreased red blood cells - unusually weak or tired, fainting spells, lightheadedness Side effects that usually do not require medical attention (report to your doctor or health care professional if they continue or are bothersome):  constipation  hair loss  headache  loss of appetite  mouth sores  stomach pain This list may not describe all possible side effects. Call your doctor for medical advice about side effects. You may report side effects to FDA at 1-800-FDA-1088. Where should I keep my medicine? This drug is given in a hospital or clinic and will not be stored at home. NOTE: This sheet is a summary. It may not cover all possible information. If you have questions about this medicine, talk to your doctor, pharmacist,  or health care provider.  2021 Elsevier/Gold Standard (2018-12-25 17:46:13)  Leucovorin injection What is this medicine? LEUCOVORIN (loo koe VOR in) is used to prevent or treat the harmful effects of some medicines. This medicine is used to treat anemia caused by a low amount of folic acid in the body. It is also used with 5-fluorouracil (5-FU) to treat colon cancer. This medicine may be used for other purposes; ask your health care provider or pharmacist if you have questions. What should I tell my health care provider before I take this medicine? They need to know if you have any of these conditions:  anemia from low levels of vitamin B-12 in the blood  an unusual or allergic reaction to leucovorin, folic acid, other medicines, foods, dyes, or preservatives  pregnant or trying to get pregnant  breast-feeding How should I use this medicine? This medicine is for injection into a muscle or into a vein. It is given  by a health care professional in a hospital or clinic setting. Talk to your pediatrician regarding the use of this medicine in children. Special care may be needed. Overdosage: If you think you have taken too much of this medicine contact a poison control center or emergency room at once. NOTE: This medicine is only for you. Do not share this medicine with others. What if I miss a dose? This does not apply. What may interact with this medicine?  capecitabine  fluorouracil  phenobarbital  phenytoin  primidone  trimethoprim-sulfamethoxazole This list may not describe all possible interactions. Give your health care provider a list of all the medicines, herbs, non-prescription drugs, or dietary supplements you use. Also tell them if you smoke, drink alcohol, or use illegal drugs. Some items may interact with your medicine. What should I watch for while using this medicine? Your condition will be monitored carefully while you are receiving this medicine. This medicine  may increase the side effects of 5-fluorouracil, 5-FU. Tell your doctor or health care professional if you have diarrhea or mouth sores that do not get better or that get worse. What side effects may I notice from receiving this medicine? Side effects that you should report to your doctor or health care professional as soon as possible:  allergic reactions like skin rash, itching or hives, swelling of the face, lips, or tongue  breathing problems  fever, infection  mouth sores  unusual bleeding or bruising  unusually weak or tired Side effects that usually do not require medical attention (report to your doctor or health care professional if they continue or are bothersome):  constipation or diarrhea  loss of appetite  nausea, vomiting This list may not describe all possible side effects. Call your doctor for medical advice about side effects. You may report side effects to FDA at 1-800-FDA-1088. Where should I keep my medicine? This drug is given in a hospital or clinic and will not be stored at home. NOTE: This sheet is a summary. It may not cover all possible information. If you have questions about this medicine, talk to your doctor, pharmacist, or health care provider.  2021 Elsevier/Gold Standard (2007-07-31 16:50:29)  Fluorouracil, 5-FU injection What is this medicine? FLUOROURACIL, 5-FU (flure oh YOOR a sil) is a chemotherapy drug. It slows the growth of cancer cells. This medicine is used to treat many types of cancer like breast cancer, colon or rectal cancer, pancreatic cancer, and stomach cancer. This medicine may be used for other purposes; ask your health care provider or pharmacist if you have questions. COMMON BRAND NAME(S): Adrucil What should I tell my health care provider before I take this medicine? They need to know if you have any of these conditions:  blood disorders  dihydropyrimidine dehydrogenase (DPD) deficiency  infection (especially a virus  infection such as chickenpox, cold sores, or herpes)  kidney disease  liver disease  malnourished, poor nutrition  recent or ongoing radiation therapy  an unusual or allergic reaction to fluorouracil, other chemotherapy, other medicines, foods, dyes, or preservatives  pregnant or trying to get pregnant  breast-feeding How should I use this medicine? This drug is given as an infusion or injection into a vein. It is administered in a hospital or clinic by a specially trained health care professional. Talk to your pediatrician regarding the use of this medicine in children. Special care may be needed. Overdosage: If you think you have taken too much of this medicine contact a poison control center or emergency  room at once. NOTE: This medicine is only for you. Do not share this medicine with others. What if I miss a dose? It is important not to miss your dose. Call your doctor or health care professional if you are unable to keep an appointment. What may interact with this medicine? Do not take this medicine with any of the following medications:  live virus vaccines This medicine may also interact with the following medications:  medicines that treat or prevent blood clots like warfarin, enoxaparin, and dalteparin This list may not describe all possible interactions. Give your health care provider a list of all the medicines, herbs, non-prescription drugs, or dietary supplements you use. Also tell them if you smoke, drink alcohol, or use illegal drugs. Some items may interact with your medicine. What should I watch for while using this medicine? Visit your doctor for checks on your progress. This drug may make you feel generally unwell. This is not uncommon, as chemotherapy can affect healthy cells as well as cancer cells. Report any side effects. Continue your course of treatment even though you feel ill unless your doctor tells you to stop. In some cases, you may be given additional  medicines to help with side effects. Follow all directions for their use. Call your doctor or health care professional for advice if you get a fever, chills or sore throat, or other symptoms of a cold or flu. Do not treat yourself. This drug decreases your body's ability to fight infections. Try to avoid being around people who are sick. This medicine may increase your risk to bruise or bleed. Call your doctor or health care professional if you notice any unusual bleeding. Be careful brushing and flossing your teeth or using a toothpick because you may get an infection or bleed more easily. If you have any dental work done, tell your dentist you are receiving this medicine. Avoid taking products that contain aspirin, acetaminophen, ibuprofen, naproxen, or ketoprofen unless instructed by your doctor. These medicines may hide a fever. Do not become pregnant while taking this medicine. Women should inform their doctor if they wish to become pregnant or think they might be pregnant. There is a potential for serious side effects to an unborn child. Talk to your health care professional or pharmacist for more information. Do not breast-feed an infant while taking this medicine. Men should inform their doctor if they wish to father a child. This medicine may lower sperm counts. Do not treat diarrhea with over the counter products. Contact your doctor if you have diarrhea that lasts more than 2 days or if it is severe and watery. This medicine can make you more sensitive to the sun. Keep out of the sun. If you cannot avoid being in the sun, wear protective clothing and use sunscreen. Do not use sun lamps or tanning beds/booths. What side effects may I notice from receiving this medicine? Side effects that you should report to your doctor or health care professional as soon as possible:  allergic reactions like skin rash, itching or hives, swelling of the face, lips, or tongue  low blood counts - this medicine  may decrease the number of white blood cells, red blood cells and platelets. You may be at increased risk for infections and bleeding.  signs of infection - fever or chills, cough, sore throat, pain or difficulty passing urine  signs of decreased platelets or bleeding - bruising, pinpoint red spots on the skin, black, tarry stools, blood in the urine  signs of decreased red blood cells - unusually weak or tired, fainting spells, lightheadedness  breathing problems  changes in vision  chest pain  mouth sores  nausea and vomiting  pain, swelling, redness at site where injected  pain, tingling, numbness in the hands or feet  redness, swelling, or sores on hands or feet  stomach pain  unusual bleeding Side effects that usually do not require medical attention (report to your doctor or health care professional if they continue or are bothersome):  changes in finger or toe nails  diarrhea  dry or itchy skin  hair loss  headache  loss of appetite  sensitivity of eyes to the light  stomach upset  unusually teary eyes This list may not describe all possible side effects. Call your doctor for medical advice about side effects. You may report side effects to FDA at 1-800-FDA-1088. Where should I keep my medicine? This drug is given in a hospital or clinic and will not be stored at home. NOTE: This sheet is a summary. It may not cover all possible information. If you have questions about this medicine, talk to your doctor, pharmacist, or health care provider.  2021 Elsevier/Gold Standard (2018-12-25 15:00:03)

## 2020-07-02 LAB — CANCER ANTIGEN 19-9: CA 19-9: 338 U/mL — ABNORMAL HIGH (ref 0–35)

## 2020-07-03 ENCOUNTER — Other Ambulatory Visit: Payer: Self-pay

## 2020-07-03 ENCOUNTER — Inpatient Hospital Stay: Payer: Medicare Other

## 2020-07-03 VITALS — BP 109/45 | HR 71 | Temp 98.4°F | Resp 18

## 2020-07-03 DIAGNOSIS — C250 Malignant neoplasm of head of pancreas: Secondary | ICD-10-CM

## 2020-07-03 DIAGNOSIS — Z5111 Encounter for antineoplastic chemotherapy: Secondary | ICD-10-CM | POA: Diagnosis not present

## 2020-07-03 MED ORDER — PEGFILGRASTIM-CBQV 6 MG/0.6ML ~~LOC~~ SOSY
6.0000 mg | PREFILLED_SYRINGE | Freq: Once | SUBCUTANEOUS | Status: AC
Start: 2020-07-03 — End: 2020-07-03
  Administered 2020-07-03: 6 mg via SUBCUTANEOUS

## 2020-07-03 MED ORDER — SODIUM CHLORIDE 0.9% FLUSH
10.0000 mL | INTRAVENOUS | Status: DC | PRN
  Administered 2020-07-03: 10 mL
  Filled 2020-07-03: qty 10

## 2020-07-03 MED ORDER — HEPARIN SOD (PORK) LOCK FLUSH 100 UNIT/ML IV SOLN
500.0000 [IU] | Freq: Once | INTRAVENOUS | Status: AC | PRN
  Administered 2020-07-03: 500 [IU]
  Filled 2020-07-03: qty 5

## 2020-07-03 NOTE — Patient Instructions (Signed)
Pegfilgrastim injection What is this medicine? PEGFILGRASTIM (PEG fil gra stim) is a long-acting granulocyte colony-stimulating factor that stimulates the growth of neutrophils, a type of white blood cell important in the body's fight against infection. It is used to reduce the incidence of fever and infection in patients with certain types of cancer who are receiving chemotherapy that affects the bone marrow, and to increase survival after being exposed to high doses of radiation. This medicine may be used for other purposes; ask your health care provider or pharmacist if you have questions. COMMON BRAND NAME(S): Fulphila, Neulasta, Nyvepria, UDENYCA, Ziextenzo What should I tell my health care provider before I take this medicine? They need to know if you have any of these conditions:  kidney disease  latex allergy  ongoing radiation therapy  sickle cell disease  skin reactions to acrylic adhesives (On-Body Injector only)  an unusual or allergic reaction to pegfilgrastim, filgrastim, other medicines, foods, dyes, or preservatives  pregnant or trying to get pregnant  breast-feeding How should I use this medicine? This medicine is for injection under the skin. If you get this medicine at home, you will be taught how to prepare and give the pre-filled syringe or how to use the On-body Injector. Refer to the patient Instructions for Use for detailed instructions. Use exactly as directed. Tell your healthcare provider immediately if you suspect that the On-body Injector may not have performed as intended or if you suspect the use of the On-body Injector resulted in a missed or partial dose. It is important that you put your used needles and syringes in a special sharps container. Do not put them in a trash can. If you do not have a sharps container, call your pharmacist or healthcare provider to get one. Talk to your pediatrician regarding the use of this medicine in children. While this drug  may be prescribed for selected conditions, precautions do apply. Overdosage: If you think you have taken too much of this medicine contact a poison control center or emergency room at once. NOTE: This medicine is only for you. Do not share this medicine with others. What if I miss a dose? It is important not to miss your dose. Call your doctor or health care professional if you miss your dose. If you miss a dose due to an On-body Injector failure or leakage, a new dose should be administered as soon as possible using a single prefilled syringe for manual use. What may interact with this medicine? Interactions have not been studied. This list may not describe all possible interactions. Give your health care provider a list of all the medicines, herbs, non-prescription drugs, or dietary supplements you use. Also tell them if you smoke, drink alcohol, or use illegal drugs. Some items may interact with your medicine. What should I watch for while using this medicine? Your condition will be monitored carefully while you are receiving this medicine. You may need blood work done while you are taking this medicine. Talk to your health care provider about your risk of cancer. You may be more at risk for certain types of cancer if you take this medicine. If you are going to need a MRI, CT scan, or other procedure, tell your doctor that you are using this medicine (On-Body Injector only). What side effects may I notice from receiving this medicine? Side effects that you should report to your doctor or health care professional as soon as possible:  allergic reactions (skin rash, itching or hives, swelling of   the face, lips, or tongue)  back pain  dizziness  fever  pain, redness, or irritation at site where injected  pinpoint red spots on the skin  red or dark-brown urine  shortness of breath or breathing problems  stomach or side pain, or pain at the shoulder  swelling  tiredness  trouble  passing urine or change in the amount of urine  unusual bruising or bleeding Side effects that usually do not require medical attention (report to your doctor or health care professional if they continue or are bothersome):  bone pain  muscle pain This list may not describe all possible side effects. Call your doctor for medical advice about side effects. You may report side effects to FDA at 1-800-FDA-1088. Where should I keep my medicine? Keep out of the reach of children. If you are using this medicine at home, you will be instructed on how to store it. Throw away any unused medicine after the expiration date on the label. NOTE: This sheet is a summary. It may not cover all possible information. If you have questions about this medicine, talk to your doctor, pharmacist, or health care provider.  2021 Elsevier/Gold Standard (2019-02-15 13:20:51)  

## 2020-07-07 ENCOUNTER — Other Ambulatory Visit: Payer: Self-pay

## 2020-07-07 ENCOUNTER — Ambulatory Visit
Admission: RE | Admit: 2020-07-07 | Discharge: 2020-07-07 | Disposition: A | Discharge status: Home / Self Care | Payer: Medicare Other | Source: Ambulatory Visit | Attending: Radiation Oncology | Admitting: Radiation Oncology

## 2020-07-07 ENCOUNTER — Encounter: Payer: Self-pay | Admitting: Radiation Oncology

## 2020-07-07 VITALS — Ht 63.0 in | Wt 116.0 lb

## 2020-07-07 DIAGNOSIS — C250 Malignant neoplasm of head of pancreas: Secondary | ICD-10-CM

## 2020-07-07 NOTE — Progress Notes (Signed)
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  Biopsies of Pancreas Mass 01/20/2020    Past/Anticipated interventions by surgeon, if any:  Dr. Rush Landmark 03/09/2020 -ERCP, Biliary Spincterotomy, Biliary Stent  Past/Anticipated interventions by medical oncology, if any:  Dr. Benay Spice 07/01/2020 - FOLFIRINOX -She will complete a final cycle of chemotherapy today.  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 -We will refer her to Dr. Lisbeth Renshaw to consider conventional radiation or stereotactic radiosurgery.   -We will prescribe concurrent capecitabine if Dr. Lisbeth Renshaw recommends conventional radiation. 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   Weight changes, if any: Has had some weight loss due to taste changes related to chemo over the past couple of weeks.  Weight normally ranges 120's, down to 116.  Trying to gain some weight back, taste changes are starting to get a little better the last week.  Bowel/Bladder complaints, if any: Has had a little bit of diarrhea related to chemo pump.  Nausea / Vomiting, if any: No  Pain issues, if any:  no   SAFETY ISSUES:  Prior radiation? No  Pacemaker/ICD? No  Possible current pregnancy? Hysterectomy  Is the patient on methotrexate? No  Current Complaints/Details: [employer] 03/11/2020

## 2020-07-07 NOTE — Progress Notes (Signed)
Radiation Oncology         [phone] ________________________________  Initial Outpatient Consultation - Conducted via telephone due to current COVID-19 concerns for limiting patient exposure  I spoke with the patient to conduct this consult visit via telephone to spare the patient unnecessary potential exposure in the healthcare setting during the current COVID-19 pandemic. The patient was notified in advance and was offered a [place] meeting to allow for face to face communication but unfortunately reported that they did not have the appropriate resources/technology to support such a visit and instead preferred to proceed with a telephone consult.  Name: Nancy Hawkins        MRN: [id number]  Date of Service: 07/07/2020 DOB: 08-Apr-1940  UT:MLYY, Alexis Goodell., MD  Ladell Pier, MD     REFERRING PHYSICIAN: Ladell Pier, MD   DIAGNOSIS: The encounter diagnosis was Primary cancer of head of pancreas [facility]).   HISTORY OF PRESENT ILLNESS: Nancy Hawkins is a 80 y.o. female seen at the request of Dr. Benay Spice for a history of pancreatic cancer that was diagnosed in October 2021. CT imaging in October 2021 showed a 2.5 cm mass in the mid body of the pancreas which abutted the celiac axis and splenic vein. This was around the time of developing pancreatitis as well. CA 19-9 was 228 at that time. It did improve to 189 on 11/19/19. She underwent an EGD/EUS on  11/20/19 and the tumor was staged as T2N0 and FNA showed reactive changes. 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 No metastatic disease was noted. She began FOLFOX on 03/23/20 and her CA 19-9 was 717 at that time and has improved since, it was last 338 on 07/01/20, and her CT scan on 06/15/20 measured the area at 3 x 2.8 cm, previously 3.5 x 3.7. 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  Her case was discussed in multidisciplinary conference last week and she's contacted today to discuss stereotactic body radiotherapy.    PREVIOUS RADIATION THERAPY: No   PAST MEDICAL HISTORY:  Past Medical History:  Diagnosis Date  . Diabetes mellitus without complication ([place])    type 2  . Dysrhythmia    a-fib 15 yrs ago per patient on 01/17/20  . GERD (gastroesophageal reflux disease)   . Hypercholesteremia   . Hypertension   . Pancreatic cancer ([place])   . Pancreatic mass   . Pancreatitis    Hx  . Seasonal allergies        PAST SURGICAL HISTORY: Past Surgical History:  Procedure Laterality Date  . ABDOMINAL HYSTERECTOMY    . BILIARY DILATION  03/09/2020   Procedure: BILIARY DILATION;  Surgeon: Rush Landmark Telford Nab., MD;  Location: [place];  Service: Gastroenterology;;  . BILIARY STENT PLACEMENT  03/09/2020   Procedure: BILIARY STENT PLACEMENT;  Surgeon: Irving Copas., MD;  Location: [place];  Service: Gastroenterology;;  . BIOPSY  11/20/2019   Procedure: BIOPSY;  Surgeon: Irving Copas., MD;  Location: [place];  Service: Gastroenterology;;  . CHOLECYSTECTOMY    . COLONOSCOPY WITH PROPOFOL N/A 11/20/2019   Procedure: COLONOSCOPY WITH PROPOFOL;  Surgeon: Rush Landmark Telford Nab., MD;  Location: [place];  Service: Gastroenterology;  Laterality: N/A;  . ENDOSCOPIC MUCOSAL RESECTION  11/20/2019   Procedure: ENDOSCOPIC MUCOSAL RESECTION;  Surgeon:  Mansouraty, Telford Nab., MD;  Location: [facility];  Service: Gastroenterology;;  . ENDOSCOPIC RETROGRADE CHOLANGIOPANCREATOGRAPHY (ERCP) WITH PROPOFOL N/A 03/07/2020   Procedure: ENDOSCOPIC RETROGRADE CHOLANGIOPANCREATOGRAPHY (ERCP) WITH PROPOFOL;  Surgeon: Carol Ada, MD;  Location: [place];  Service: Endoscopy;  Laterality: N/A;  . ERCP N/A 03/09/2020   Procedure: ENDOSCOPIC RETROGRADE CHOLANGIOPANCREATOGRAPHY (ERCP);  Surgeon:  Irving Copas., MD;  Location: [place];  Service: Gastroenterology;  Laterality: N/A;  . ESOPHAGOGASTRODUODENOSCOPY (EGD) WITH PROPOFOL N/A 11/20/2019   Procedure: ESOPHAGOGASTRODUODENOSCOPY (EGD) WITH PROPOFOL;  Surgeon: Rush Landmark Telford Nab., MD;  Location: [place];  Service: Gastroenterology;  Laterality: N/A;  . ESOPHAGOGASTRODUODENOSCOPY (EGD) WITH PROPOFOL N/A 01/20/2020   Procedure: ESOPHAGOGASTRODUODENOSCOPY (EGD) WITH PROPOFOL;  Surgeon: Rush Landmark Telford Nab., MD;  Location: [place];  Service: Gastroenterology;  Laterality: N/A;  . EUS N/A 01/20/2020   Procedure: UPPER ENDOSCOPIC ULTRASOUND (EUS) RADIAL;  Surgeon: Rush Landmark Telford Nab., MD;  Location: [place];  Service: Gastroenterology;  Laterality: N/A;  . FINE NEEDLE ASPIRATION  11/20/2019   Procedure: FINE NEEDLE ASPIRATION (FNA) LINEAR;  Surgeon: Irving Copas., MD;  Location: [place];  Service: Gastroenterology;;  . FINE NEEDLE ASPIRATION BIOPSY  01/20/2020   Procedure: FINE NEEDLE ASPIRATION BIOPSY;  Surgeon: Rush Landmark Telford Nab., MD;  Location: [place];  Service: Gastroenterology;;  . HEMOSTASIS CLIP PLACEMENT  11/20/2019   Procedure: HEMOSTASIS CLIP PLACEMENT;  Surgeon: Irving Copas., MD;  Location: [place];  Service: Gastroenterology;;  . NASAL SINUS SURGERY    . POLYPECTOMY  11/20/2019   Procedure: POLYPECTOMY;  Surgeon: Mansouraty, Telford Nab., MD;  Location: [place];  Service: Gastroenterology;;  . Sol Passer PLACEMENT N/A 03/11/2020   Procedure: INSERTION PORT-A-CATH;  Surgeon: Dwan Bolt, MD;  Location: [place];  Service: General;  Laterality: N/A;  LMA  . SPHINCTEROTOMY  03/07/2020   Procedure: SPHINCTEROTOMY;  Surgeon: Carol Ada, MD;  Location: [place];  Service: Endoscopy;;  . Joan Mayans  03/09/2020   Procedure: Joan Mayans;  Surgeon: Mansouraty, Telford Nab., MD;  Location: [place];  Service: Gastroenterology;;  .  Maryagnes Amos INJECTION  11/20/2019   Procedure: SUBMUCOSAL LIFTING INJECTION;  Surgeon: Irving Copas., MD;  Location: [place];  Service: Gastroenterology;;  . UPPER ESOPHAGEAL ENDOSCOPIC ULTRASOUND (EUS) N/A 11/20/2019   Procedure: UPPER ESOPHAGEAL ENDOSCOPIC ULTRASOUND (EUS);  Surgeon: Irving Copas., MD;  Location: [place];  Service: Gastroenterology;  Laterality: N/A;     FAMILY HISTORY:  Family History  Family history unknown: Yes     SOCIAL HISTORY:  reports that she has never smoked. She has never used smokeless tobacco. She reports previous alcohol use. She reports that she does not use drugs. The patient is married and resides in [place]. 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   ALLERGIES: Cephalosporins, Erythromycin, Catering manager, and Penicillins   MEDICATIONS:  Current Outpatient Medications  Medication Sig Dispense Refill  . ACCU-CHEK GUIDE test strip     . Cholecalciferol (VITAMIN D3) 25 MCG (1000 UT) CAPS Take 1,000 Units by mouth daily.    Marland Kitchen CREON 36000-114000 units CPEP capsule TAKE 1 CAPSULE (36,000 UNITS TOTAL) BY MOUTH 3 (THREE) TIMES DAILY BEFORE MEALS. 90 capsule 2  . diltiazem (TIAZAC) 120 MG 24 hr capsule Take 120 mg by mouth daily.    . Ensure Max Protein (ENSURE MAX PROTEIN) LIQD Take 330 mLs (11 oz total) by mouth 2 (two) times daily with a meal. (Patient taking differently: Take 11 oz by mouth  2 (two) times daily between meals.)    . famotidine (PEPCID) 40 MG tablet Take 40 mg by mouth daily as needed for heartburn or indigestion.    . fexofenadine (ALLEGRA) 180 MG tablet Take 180 mg by mouth in the morning.    . hyoscyamine (LEVSIN SL) 0.125 MG SL tablet Place 0.125 mg under the tongue 3 (three) times daily with meals.    . insulin aspart (NOVOLOG) 100 UNIT/ML FlexPen Inject 3 Units into the skin 3 (three) times daily with meals. (Patient taking differently: Inject 3  Units into the skin 3 (three) times daily before meals.) 15 mL 0  . insulin aspart (NOVOLOG) 100 UNIT/ML FlexPen Inject into the skin.    . Insulin Glargine (BASAGLAR KWIKPEN) 100 UNIT/ML Inject 12 Units into the skin daily. (Patient taking differently: Inject 12 Units into the skin at bedtime.) 15 mL 0  . Insulin Pen Needle 32G X 4 MM MISC USE AS DIRECTED FOUT TIMES DAILY WITH INSULIN 100 each 0  . lidocaine-prilocaine (EMLA) cream Apply 1 application topically as needed. Apply over port 45 minutes prior to having port accessed for chemotherapy treatment. 30 g 0  . mirtazapine (REMERON SOL-TAB) 15 MG disintegrating tablet TAKE 1 TABLET BY MOUTH EVERYDAY AT BEDTIME 90 tablet 1  . Multiple Vitamins-Minerals (OCUVITE EYE HEALTH FORMULA PO) Take 1 tablet by mouth daily.    . potassium chloride SA (KLOR-CON M20) 20 MEQ tablet Take 1 tablet (20 mEq total) by mouth daily. 30 tablet 1  . Probiotic Product (PROBIOTIC DAILY) CAPS Take 1 capsule by mouth daily.    . prochlorperazine (COMPAZINE) 10 MG tablet Take 1 tablet (10 mg total) by mouth every 6 (six) hours as needed for nausea or vomiting. 30 tablet 0  . telmisartan (MICARDIS) 40 MG tablet Take 40 mg by mouth daily.    . traMADol (ULTRAM) 50 MG tablet Take 1 tablet (50 mg total) by mouth every 6 (six) hours as needed for moderate pain. 30 tablet 0  . triamcinolone (NASACORT) 55 MCG/ACT AERO nasal inhaler Place 2 sprays into the nose daily as needed (for congestion).    Marland Kitchen dicyclomine (BENTYL) 20 MG tablet Take 1 tablet (20 mg total) by mouth every 6 (six) hours as needed for spasms. (Patient not taking: No sig reported) 90 tablet 1  . guaiFENesin (MUCINEX) 600 MG 12 hr tablet Take 600 mg by mouth every 12 (twelve) hours as needed for cough or to loosen phlegm. (Patient not taking: No sig reported)    . ondansetron (ZOFRAN) 8 MG tablet Take 1 tablet (8 mg total) by mouth every 8 (eight) hours as needed for nausea or vomiting. (Patient not taking: No sig  reported) 20 tablet 0   No current facility-administered medications for this encounter.     REVIEW OF SYSTEMS: On review of systems, the patient reports that she is doing pretty well. She does have complaints of fatigue for a few days after chemo as well as poor appetite and weakness, but she's feeling better today and hopes to increase her oral intake. She denies any nausea or abdominal pain. No other complaints are verbalized.    PHYSICAL EXAM:  Weight 116 pounds per pt report. Pain Assessment Pain Score: 0-No pain/10 Unable to assess due to encounter type.  ECOG = 1  0 - Asymptomatic (Fully active, able to carry on all predisease activities without restriction)  1 - Symptomatic but completely ambulatory (Restricted in physically strenuous activity but ambulatory and able to carry  out work of a light or sedentary nature. For example, light housework, office work)  2 - Symptomatic, <50% in bed during the day (Ambulatory and capable of all self care but unable to carry out any work activities. Up and about more than 50% of waking hours)  3 - Symptomatic, >50% in bed, but not bedbound (Capable of only limited self-care, confined to bed or chair 50% or more of waking hours)  4 - Bedbound (Completely disabled. Cannot carry on any self-care. Totally confined to bed or chair)  5 - Death   Eustace Pen MM, Creech RH, Tormey DC, et al. [phone]). "Toxicity and response criteria of the [facility]". [place] Oncol. 5 (6): 649-55    LABORATORY DATA:  Lab Results  Component Value Date   WBC 9.0 07/01/2020   HGB 8.9 (L) 07/01/2020   HCT 27.6 (L) 07/01/2020   MCV 100.4 (H) 07/01/2020   PLT 146 (L) 07/01/2020   Lab Results  Component Value Date   NA 141 07/01/2020   K 3.6 07/01/2020   CL 110 07/01/2020   CO2 21 (L) 07/01/2020   Lab Results  Component Value Date   ALT 17 07/01/2020   AST 13 (L) 07/01/2020   ALKPHOS 163 (H) 07/01/2020   BILITOT 0.4 07/01/2020       RADIOGRAPHY: CT CHEST ABDOMEN PELVIS W CONTRAST  Result Date: 06/15/2020 CLINICAL DATA:  Restaging of pancreatic neoplasm in a 80 year old female. EXAM: CT CHEST, ABDOMEN, AND PELVIS WITH CONTRAST TECHNIQUE: Multidetector CT imaging of the chest, abdomen and pelvis was performed following the standard protocol during bolus administration of intravenous contrast. CONTRAST:  93mL OMNIPAQUE IOHEXOL 300 MG/ML  SOLN COMPARISON:  March 05, 2020. FINDINGS: CT CHEST FINDINGS Cardiovascular: Calcified atheromatous plaque and noncalcified plaque in the thoracic aorta similar to prior exams. Three-vessel coronary artery disease. Heart size is stable without pericardial effusion. Central pulmonary vasculature is normal caliber. Mediastinum/Nodes: Esophagus mildly patulous with some ingested contrast media in the lumen of the esophagus. Central venous access device terminates in the mid superior vena cava. No thoracic inlet, axillary or mediastinal lymphadenopathy. No hilar lymphadenopathy. Lungs/Pleura: No sign of suspicious pulmonary nodule. No consolidation. No pleural effusion. Airways are patent. Mild basilar atelectasis. Musculoskeletal: See below for full musculoskeletal details. Osteopenia. Subacute rib fractures of LEFT-sided sixth and seventh ribs. CT ABDOMEN PELVIS FINDINGS Hepatobiliary: Post stent placement in the common bile duct since previous imaging there is resultant pneumobilia. Mildly heterogeneous enhancement of the LEFT hepatic lobe. Suspected background hepatic steatosis. No focal, tiny area of hypodensity seen in the RIGHT hepatic lobe 5 mm. This is not seen on previous imaging. 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 Flow remains present into the liver. Collateral pathways are present about the porta hepatis. Also with signs of collateral pathways about the stomach. Appearance is largely similar when compared to the study of  March 05, 2020. Pancreas: Pancreatic atrophy and ductal dilation peripheral to a pancreatic mass is similar to previous imaging. Mass in the head measuring 3.0 x 2.8 cm previously approximately 3.5 x 3.7 cm. Interval stent placement. Some stranding around vessels in the upper abdomen with similar appearance of portal venous involvement and with further collateral development in the upper abdomen. 360 degree involvement with high-grade narrowing of the portal vein. Stranding about the SMA origin without soft tissue. Spleen: Splenomegaly as before subtle low-density lesion in the cephalad spleen is unchanged.  Adrenals/Urinary Tract: Adrenal glands are normal. Symmetric renal enhancement without suspicious renal lesion. Small cysts in the bilateral kidneys. Urinary bladder is under distended limiting evaluation but with smooth contours. No gross abnormality. Stomach/Bowel: Collateral pathways about the upper abdomen involving the stomach. No acute gastrointestinal process. Colonic diverticulosis. Vascular/Lymphatic: Calcified atheromatous plaque of the abdominal aorta. No aneurysmal dilation. Retroaortic LEFT renal vein. No discrete adenopathy in the upper abdomen. Reproductive: No pelvic lymphadenopathy.  Post hysterectomy. Other: No ascites.  No free air. Musculoskeletal: Similar appearance of T12 compression fracture and lower thoracic compression fractures. No acute musculoskeletal process. No destructive bone findings. IMPRESSION: 1. Interval stent placement in the common bile duct since previous imaging there is resultant pneumobilia. 2. Decreasing size of pancreatic mass still with portal venous involvement and extensive upper abdominal collaterals. 3. Subtle hypodense area in the RIGHT hepatic lobe may reflect peripheral biliary duct (more branching pattern on coronal images) distension and or mild cholangitis, consider attention on short interval follow-up and correlation with symptoms. Other areas of mild  heterogeneity are present particularly in the LEFT hepatic lobe following stent placement. Findings favor mild cholangitis. 4. Hepatic steatosis. 5. Subacute rib fractures of LEFT-sided sixth and seventh ribs. 6. Aortic atherosclerosis. These results will be called to the ordering clinician or representative by the Radiologist Assistant, and communication documented in the PACS or [employer]. Aortic Atherosclerosis (ICD10-I70.0). Electronically Signed   By: Zetta Bills M.D.   On: 06/15/2020 15:40       IMPRESSION/PLAN: 1. 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 Dr. Lisbeth Renshaw discusses the pathology findings and reviews the nature of disease of the pancreas. Dr. Lisbeth Renshaw discusses her case and that she's had improvement in her disease and would like to offer stereotactic body radiotherapy (SBRT).  We discussed the risks, benefits, short, and long term effects of radiotherapy, as well as the curative intent, and the patient is interested in proceeding. Dr. Lisbeth Renshaw discusses the delivery and logistics of radiotherapy and anticipates a course of 5 fractions of radiotherapy. We will reach out to our GI colleagues to see if they can place fiducial markers in her pancreas so we can proceed with simulation and treatment. The patient will be contacted once we know a date for fiducial marker placement so we may coordinate simulation.    Given current concerns for patient exposure during the COVID-19 pandemic, this encounter was conducted via telephone.  The patient has provided two factor identification and has given verbal consent for this type of encounter and has been advised to only accept a meeting of this type in a secure network environment. The time spent during this encounter was 60 minutes including preparation, discussion, and coordination of the patient's care. The attendants for this meeting include Blenda Nicely, RN, Dr. Lisbeth Renshaw, Hayden Pedro  and [name].  During  the encounter,  Blenda Nicely, RN, Dr. Lisbeth Renshaw, and Hayden Pedro were located at [facility] Radiation Oncology Department.  [name] was located at home.    The above documentation reflects my direct findings during this shared patient visit. Please see the separate note by Dr. Lisbeth Renshaw on this date for the remainder of the patient's plan of care.    Carola Rhine, [facility]   **Disclaimer: This note was dictated with voice recognition software. Similar sounding words can inadvertently be transcribed and this note may contain transcription errors which may not have been corrected upon publication of note.**

## 2020-07-08 ENCOUNTER — Telehealth: Payer: Self-pay

## 2020-07-08 ENCOUNTER — Other Ambulatory Visit: Payer: Self-pay

## 2020-07-08 DIAGNOSIS — C259 Malignant neoplasm of pancreas, unspecified: Secondary | ICD-10-CM

## 2020-07-08 NOTE — Telephone Encounter (Signed)
EUS fiducial marker 07/23/20 at [facility] with GM at 730 am.    EUS scheduled, pt instructed and medications reviewed.  Patient instructions mailed to home and sent to My Chart.  Patient to call with any questions or concerns.

## 2020-07-08 NOTE — Telephone Encounter (Signed)
-----   Message from Irving Copas., MD sent at 07/07/2020  5:34 PM EDT ----- ACP, Happy to help my patient.  I think both of [place] are quite backed up for procedure slots but I can add her onto my hospital week in the second week of June. Mykael Batz, go ahead and get patient scheduled on my hospital week for EUS fiducials placement first case either hospital. Thanks. GM ----- Message ----- From: Hayden Pedro, PA-C Sent: 07/07/2020   1:33 PM EDT To: Milus Banister, MD, Timothy Lasso, RN, #  Hi all- Ms. Gambino looks like a good candidate for SBRT. Drs. Mansouraty/Jacobs would either of you be able to place fiducial markers in her pancreas for [place]? Thanks, Bryson Ha

## 2020-07-08 NOTE — Telephone Encounter (Signed)
Hayden Pedro, PA-C  Mansouraty, Telford Nab., MD; Timothy Lasso, RN; Milus Banister, MD Thank you all! Davette Nugent can you let me know what the date ends up being?  Thanks,  Bryson Ha

## 2020-07-10 ENCOUNTER — Telehealth: Payer: Self-pay

## 2020-07-10 NOTE — Telephone Encounter (Signed)
Nutrition  Called patient for nutrition follow-up at 9:30am. No answer or option to leave voicemail as mailbox is full.    Called patient back ~1:40pm times 2.  Each time phone was answered but no one ever spoke. Could hear sounds in back ground.  RD said "Hello, Hello" but no one ever spoke and phone was hung up each time.   Santina Trillo B. [name], [place], [place] Registered Dietitian [phone] (mobile)

## 2020-07-13 ENCOUNTER — Inpatient Hospital Stay: Payer: Medicare Other | Attending: Radiation Oncology

## 2020-07-13 NOTE — Progress Notes (Signed)
Nutrition Follow-up:  Patient with pancreatic cancer. Patient has completed folfirinox. Planning to start radiation.    Spoke with patient via phone for nutrition follow-up.  Patient reports that she does not have much of an appetite.  Some days are better than others.  Thinks her stomach has shrunk.  Reports this am ate scrambled eggs with cheese, toast and chocolate milk. Lunch was shrimp salad but could only eat half of it.  Drinks 2 premier protein shakes per day.  Daughter makes her a smoothie with fruit, boost shake and yogurt. Eats ice cream.  Does floor yoga and house chores.  Denies nausea or diarrhea.      Medications: reviewed  Labs: reviewed  Anthropometrics:   Weight 116 lb 5/31  121 lb on 4/11 124 lb on 3/28   NUTRITION DIAGNOSIS: Unintentional weight loss continues   INTERVENTION:  Encouraged patient to continue to add calories and protein to diet. Discussed ways to do that with current eating pattern.   Encouraged patient to continue drinking supplements and can add ice cream and blend for more calories.  Contact information provided    MONITORING, EVALUATION, GOAL: weight trends, intake   NEXT VISIT: as needed  Aadon Gorelik B. [name], [place], [place] Registered Dietitian [phone] (mobile)

## 2020-07-14 ENCOUNTER — Other Ambulatory Visit: Payer: Self-pay | Admitting: Oncology

## 2020-07-14 DIAGNOSIS — C250 Malignant neoplasm of head of pancreas: Secondary | ICD-10-CM

## 2020-07-21 ENCOUNTER — Telehealth: Payer: Self-pay | Admitting: Gastroenterology

## 2020-07-21 NOTE — Telephone Encounter (Signed)
The pt has questions regarding her diabetes and whether she should take her insulin the morning of her procedure.  She has been advised to NOT take the insulin the morning of.  She will monitor her glucose until she comes in for her appt.

## 2020-07-21 NOTE — Telephone Encounter (Signed)
Inbound call from pt requesting a call back stating she has questions regarding procedure. Please advise

## 2020-07-22 ENCOUNTER — Encounter (HOSPITAL_COMMUNITY): Payer: Self-pay | Admitting: Gastroenterology

## 2020-07-22 ENCOUNTER — Inpatient Hospital Stay: Payer: Medicare Other | Admitting: Nurse Practitioner

## 2020-07-22 ENCOUNTER — Inpatient Hospital Stay: Payer: Medicare Other

## 2020-07-22 NOTE — Progress Notes (Addendum)
DUE TO COVID-19 ONLY ONE VISITOR IS ALLOWED TO COME WITH YOU AND STAY IN THE WAITING ROOM ONLY DURING PRE OP AND PROCEDURE DAY OF SURGERY.   PCP - Dr Tiana Loft Cardiologist - n/a (Had one in [place], [place]  Dr Durel Salts) Oncology - Dr Benay Spice  CT Chest x-ray - 06/15/20 EKG - 03/05/20 Stress Test - 2008 CE (Normal) ECHO - 07/24/14 CE Cardiac Cath - n/a  ICD Pacemaker/Loop - n/a  Sleep Study -  n/a CPAP - none  Fasting Blood Sugar - 150s-175s Checks Blood Sugar 8 times a day FreeStyle Libre 2 system.  THE NIGHT BEFORE SURGERY, take 1/2 your dose of Insulin Glargine .    THE MORNING OF SURGERY, No Novolog Insulin on DOS unless your CBG is greater than 220 mg/dL, then you may take  of your sliding scale (correction) dose of Novolog insulin.      If your blood sugar is less than 70 mg/dL, you will need to treat for low blood sugar: Treat a low blood sugar (less than 70 mg/dL) with  cup of clear juice (cranberry or apple), 4 glucose tablets, OR glucose gel. Recheck blood sugar in 15 minutes after treatment (to make sure it is greater than 70 mg/dL). If your blood sugar is not greater than 70 mg/dL on recheck, call [phone] for further instructions.  Anesthesia review: Yes  STOP now taking any Aspirin (unless otherwise instructed by your surgeon), Aleve, Naproxen, Ibuprofen, Motrin, Advil, Goody's, BC's, all herbal medications, fish oil, and all vitamins.   Coronavirus Screening Covid test - N/A  Ambulatory surgery  Do you have any of the following symptoms:  Cough yes/no: No Fever (>100.74F)  yes/no: No Runny nose yes/no: No Sore throat yes/no: No Difficulty breathing/shortness of breath  yes/no: No  Have you traveled in the last 14 days and where? yes/no: No  Patient verbalized understanding of instructions that were given via phone.

## 2020-07-23 ENCOUNTER — Ambulatory Visit (HOSPITAL_COMMUNITY): Payer: Medicare Other | Admitting: Vascular Surgery

## 2020-07-23 ENCOUNTER — Encounter (HOSPITAL_COMMUNITY): Payer: Self-pay | Admitting: Gastroenterology

## 2020-07-23 ENCOUNTER — Ambulatory Visit (HOSPITAL_COMMUNITY)
Admission: RE | Admit: 2020-07-23 | Discharge: 2020-07-23 | Disposition: A | Discharge status: Home / Self Care | Payer: Medicare Other | Attending: Gastroenterology | Admitting: Gastroenterology

## 2020-07-23 ENCOUNTER — Encounter (HOSPITAL_COMMUNITY)
Admission: RE | Disposition: A | Discharge status: Home / Self Care | Payer: Self-pay | Source: Home / Self Care | Attending: Gastroenterology

## 2020-07-23 DIAGNOSIS — Z79899 Other long term (current) drug therapy: Secondary | ICD-10-CM | POA: Insufficient documentation

## 2020-07-23 DIAGNOSIS — Z9689 Presence of other specified functional implants: Secondary | ICD-10-CM | POA: Diagnosis not present

## 2020-07-23 DIAGNOSIS — K8689 Other specified diseases of pancreas: Secondary | ICD-10-CM | POA: Insufficient documentation

## 2020-07-23 DIAGNOSIS — C259 Malignant neoplasm of pancreas, unspecified: Secondary | ICD-10-CM | POA: Diagnosis present

## 2020-07-23 DIAGNOSIS — Z794 Long term (current) use of insulin: Secondary | ICD-10-CM | POA: Diagnosis not present

## 2020-07-23 DIAGNOSIS — Z9221 Personal history of antineoplastic chemotherapy: Secondary | ICD-10-CM | POA: Insufficient documentation

## 2020-07-23 DIAGNOSIS — Z91038 Other insect allergy status: Secondary | ICD-10-CM | POA: Insufficient documentation

## 2020-07-23 DIAGNOSIS — K3189 Other diseases of stomach and duodenum: Secondary | ICD-10-CM | POA: Insufficient documentation

## 2020-07-23 DIAGNOSIS — Z881 Allergy status to other antibiotic agents status: Secondary | ICD-10-CM | POA: Insufficient documentation

## 2020-07-23 DIAGNOSIS — Z880 Allergy status to penicillin: Secondary | ICD-10-CM | POA: Insufficient documentation

## 2020-07-23 DIAGNOSIS — K2289 Other specified disease of esophagus: Secondary | ICD-10-CM | POA: Diagnosis not present

## 2020-07-23 HISTORY — PX: ESOPHAGOGASTRODUODENOSCOPY: SHX5428

## 2020-07-23 HISTORY — PX: EUS: SHX5427

## 2020-07-23 HISTORY — PX: BIOPSY: SHX5522

## 2020-07-23 HISTORY — PX: FIDUCIAL MARKER PLACEMENT: SHX6858

## 2020-07-23 SURGERY — EGD (ESOPHAGOGASTRODUODENOSCOPY)
Anesthesia: Monitor Anesthesia Care

## 2020-07-23 MED ORDER — SODIUM CHLORIDE 0.9 % IV SOLN: INTRAVENOUS | Status: DC

## 2020-07-23 MED ORDER — LACTATED RINGERS IV SOLN: INTRAVENOUS | Status: DC | PRN

## 2020-07-23 MED ORDER — PROPOFOL 500 MG/50ML IV EMUL
INTRAVENOUS | Status: DC | PRN
  Administered 2020-07-23: 75 ug/kg/min via INTRAVENOUS

## 2020-07-23 SURGICAL SUPPLY — 2 items
Fiducial Marker ×2 IMPLANT
fiducial marker ×2 IMPLANT

## 2020-07-23 NOTE — Op Note (Signed)
[facility] Patient Name: Nancy Hawkins Procedure Date : 07/23/2020 MRN: [id number] Attending MD: [name] , MD Date of Birth: [birth year] CSN: [id number] Age: 80 Admit Type: Outpatient Procedure:                Upper EUS Indications:              Pancreatic adenocarcinoma/Post-chemotherapy,                            Pancreatic adenocarcinoma, Fiducial Placement Providers:                [name], MD, Baird Cancer, RN, Lesia Sago, Technician Referring MD:             Jodelle Gross, Izola Price Car[facility] Living Medicines:                Monitored Anesthesia Care Complications:            No immediate complications. Estimated Blood Loss:     Estimated blood loss was minimal. Procedure:                Pre-Anesthesia Assessment:                           - Prior to the procedure, a History and Physical                            was performed, and patient medications and                            allergies were reviewed. The patient's tolerance of                            previous anesthesia was also reviewed. The risks                            and benefits of the procedure and the sedation                            options and risks were discussed with the patient.                            All questions were answered, and informed consent                            was obtained. Prior Anticoagulants: The patient has                            taken no previous anticoagulant or antiplatelet                            agents. ASA Grade Assessment: III - A patient with  severe systemic disease. After reviewing the risks                            and benefits, the patient was deemed in                            satisfactory condition to undergo the procedure.                           After obtaining informed consent, the endoscope was                            passed under direct vision.  Throughout the                            procedure, the patient's blood pressure, pulse, and                            oxygen saturations were monitored continuously. The                            [id number] ([id number]) Olympus Linear EUS scope was                            introduced through the mouth, and advanced to the                            second part of duodenum. The [id number]-[id number] ([id number])                            Olympus gastroscope was introduced through the                            mouth, and advanced to the second part of duodenum.                            The [id number]-[id number] ([id number]) Olympus duodenoscope was                            introduced through the mouth, and advanced to the                            duodenum for ultrasound examination. The upper EUS                            was accomplished without difficulty. The patient                            tolerated the procedure. Scope In: Scope Out: Findings:      ENDOSCOPIC FINDING: :      White nummular lesions were noted in the entire esophagus. Biopsies were       taken with a cold forceps for histology to rule out Candida.      The Z-line was regular  and was found 38 cm from the incisors.      Patchy mildly erythematous mucosa without bleeding was found in the       entire examined stomach - previously biopsied.      A previously placed metal biliary stent was seen emerging from the major       papilla.      No other gross lesions were noted in the duodenal bulb, in the first       portion of the duodenum and in the second portion of the duodenum.      ENDOSONOGRAPHIC FINDING: :      One stent was visualized endosonographically in the common bile duct       with evidence of pneumobilia.      The endosonographic area was distorted as a result of the metal biliary       stent. However, I could visualize an area of irregular mass-like region       within the pancreatic head. The mass was hypoechoic. The mass  measured       22 mm by 22 mm in maximal cross-sectional diameter and was directly       abutting the previously placed metal biliary stent. The endosonographic       borders were poorly-defined. There was sonographic evidence suggesting       invasion into the portal vein (manifested by interface loss less than 15       mm) as well as what appears to be collateral blood vessels in the       region. The pancreatic neck and body was examined. The endosonographic       appearance of parenchyma and the upstream pancreatic duct indicated duct       dilation and parenchymal atrophy. Patient is planned for attempt at       SBRT, so we proceeded with fiducial marker placement. Once the target       lesion in the pancreatic head was identified a preloaded marker in a 22       gauge needle was then deployed in and around the lesion. This was       repeated for a total of four markers. Impression:               EGD Impression:                           - White nummular lesions in esophageal mucosa.                            Biopsied.                           - Z-line regular, 38 cm from the incisors.                           - Erythematous mucosa in the stomach.                           - Metal biliary stent emerging from major papilla.                           - No other gross lesions in the duodenal bulb, in  the first portion of the duodenum and in the second                            portion of the duodenum.                           EUS Impression:                           - One stent was visualized endosonographically in                            the common bile duct with pneumobilia.                           - The area within the [facility] is distorted due to the                            metal biliary stent, but the mass-like region was                            identified within the pancreatic head which is most                            likely consistent  with the pancreatic                            adenocarcinoma that we have diagnosed previously,                            but its appearance is different today. There is                            portal veing abutment and also collateral blood                            vessels that seem to be in place currently.                            Fiducial markers were deployed. Recommendation:           - The patient will be observed post-procedure,                            until all discharge criteria are met.                           - Discharge patient to home.                           - Observe patient's clinical course.                           - Await path results.                           -  Return to referring providers in attempt to                            beging SBRT.                           - The findings and recommendations were discussed                            with the patient.                           - The findings and recommendations were discussed                            with the patient's family. Procedure Code(s):        --- Professional ---                           [phone], Esophagogastroduodenoscopy, flexible,                            transoral; with transendoscopic ultrasound-guided                            transmural injection of diagnostic or therapeutic                            substance(s) (eg, anesthetic, neurolytic agent) or                            fiducial marker(s) (includes endoscopic ultrasound                            examination of the esophagus, stomach, and either                            the duodenum or a surgically altered stomach where                            the jejunum is examined distal to the anastomosis) Diagnosis Code(s):        --- Professional ---                           K22.8, Other specified diseases of esophagus                           K31.89, Other diseases of stomach and duodenum                            K86.89, Other specified diseases of pancreas                           C25.9, Malignant neoplasm of pancreas, unspecified  Z08, Encounter for follow-up examination after                            completed treatment for malignant neoplasm CPT copyright 2019 American Medical Association. All rights reserved. The codes documented in this report are preliminary and upon coder review may  be revised to meet current compliance requirements. [name], MD 07/23/2020 8:43:14 AM Number of Addenda: 0

## 2020-07-23 NOTE — Transfer of Care (Signed)
Immediate Anesthesia Transfer of Care Note  Patient: Nancy Hawkins  Procedure(s) Performed: UPPER ENDOSCOPIC ULTRASOUND (EUS) RADIAL FIDUCIAL MARKER PLACEMENT ESOPHAGOGASTRODUODENOSCOPY (EGD) BIOPSY  Patient Location: Endoscopy Unit  Anesthesia Type:MAC  Level of Consciousness: drowsy and responds to stimulation  Airway & Oxygen Therapy: Patient Spontanous Breathing  Post-op Assessment: Report given to RN  Post vital signs: Reviewed and stable  Last Vitals:  Vitals Value Taken Time  BP 138/50 07/23/20 0822  Temp    Pulse 67 07/23/20 0822  Resp 17 07/23/20 0822  SpO2 100 % 07/23/20 0822  Vitals shown include unvalidated device data.  Last Pain:  Vitals:   07/23/20 0700  TempSrc: Temporal  PainSc: 0-No pain         Complications: No notable events documented.

## 2020-07-23 NOTE — Discharge Instructions (Signed)
YOU HAD AN ENDOSCOPIC PROCEDURE TODAY: Refer to the procedure report and other information in the discharge instructions given to you for any specific questions about what was found during the examination. If this information does not answer your questions, please call [place] office at [phone] to clarify.   YOU SHOULD EXPECT: Some feelings of bloating in the abdomen. Passage of more gas than usual. Walking can help get rid of the air that was put into your GI tract during the procedure and reduce the bloating. If you had a lower endoscopy (such as a colonoscopy or flexible sigmoidoscopy) you may notice spotting of blood in your stool or on the toilet paper. Some abdominal soreness may be present for a day or two, also.  DIET: Your first meal following the procedure should be a light meal and then it is ok to progress to your normal diet. A half-sandwich or bowl of soup is an example of a good first meal. Heavy or fried foods are harder to digest and may make you feel nauseous or bloated. Drink plenty of fluids but you should avoid alcoholic beverages for 24 hours. If you had a esophageal dilation, please see attached instructions for diet.    ACTIVITY: Your care partner should take you home directly after the procedure. You should plan to take it easy, moving slowly for the rest of the day. You can resume normal activity the day after the procedure however YOU SHOULD NOT DRIVE, use power tools, machinery or perform tasks that involve climbing or major physical exertion for 24 hours (because of the sedation medicines used during the test).   SYMPTOMS TO REPORT IMMEDIATELY: A gastroenterologist can be reached at any hour. Please call [phone]  for any of the following symptoms:   Following upper endoscopy (EGD, EUS, ERCP, esophageal dilation) Vomiting of blood or coffee ground material  New, significant abdominal pain  New, significant chest pain or pain under the shoulder blades  Painful or  persistently difficult swallowing  New shortness of breath  Black, tarry-looking or red, bloody stools  FOLLOW UP:  If any biopsies were taken you will be contacted by phone or by letter within the next 1-3 weeks. Call [phone]  if you have not heard about the biopsies in 3 weeks.  Please also call with any specific questions about appointments or follow up tests.  

## 2020-07-23 NOTE — Anesthesia Preprocedure Evaluation (Addendum)
Anesthesia Evaluation  Patient identified by MRN, date of birth, ID band Patient awake    Reviewed: Allergy & Precautions, NPO status , Patient's Chart, lab work & pertinent test results  Airway Mallampati: I  TM Distance: >3 FB Neck ROM: Full    Dental no notable dental hx. (+) Teeth Intact, Dental Advisory Given   Pulmonary neg pulmonary ROS,    Pulmonary exam normal breath sounds clear to auscultation       Cardiovascular hypertension, Pt. on home beta blockers and Pt. on medications Normal cardiovascular exam+ dysrhythmias (remote h/o afib) Atrial Fibrillation  Rhythm:Regular Rate:Normal     Neuro/Psych negative neurological ROS  negative psych ROS   GI/Hepatic Neg liver ROS, GERD  Medicated and Controlled,  Endo/Other  negative endocrine ROSdiabetes, Type 2, Insulin Dependent  Renal/GU negative Renal ROS  negative genitourinary   Musculoskeletal negative musculoskeletal ROS (+)   Abdominal   Peds  Hematology negative hematology ROS (+)   Anesthesia Other Findings pancreatic adenocarcinoma  Reproductive/Obstetrics                            Anesthesia Physical Anesthesia Plan  ASA: 3  Anesthesia Plan: MAC   Post-op Pain Management:    Induction: Intravenous  PONV Risk Score and Plan: 2 and Propofol infusion and Treatment may vary due to age or medical condition  Airway Management Planned: Natural Airway  Additional Equipment:   Intra-op Plan:   Post-operative Plan:   Informed Consent: I have reviewed the patients History and Physical, chart, labs and discussed the procedure including the risks, benefits and alternatives for the proposed anesthesia with the patient or authorized representative who has indicated his/her understanding and acceptance.     Dental advisory given  Plan Discussed with: CRNA  Anesthesia Plan Comments:         Anesthesia Quick  Evaluation

## 2020-07-23 NOTE — H&P (Signed)
GASTROENTEROLOGY PROCEDURE H&P NOTE   Primary Care Physician: Lilian Coma., MD  HPI: Nancy Hawkins is a 80 y.o. female who presents for EGD/EUS for fiducial placement in setting of pancreatic cancer.  Past Medical History:  Diagnosis Date   Diabetes mellitus without complication ([place])    type 2   Dysrhythmia    a-fib 15 yrs ago per patient on 01/17/20   GERD (gastroesophageal reflux disease)    Hypercholesteremia    Hypertension    Pancreatic cancer ([place])    Pancreatic mass    Pancreatitis    Hx   Seasonal allergies    Past Surgical History:  Procedure Laterality Date   ABDOMINAL HYSTERECTOMY     BILIARY DILATION  03/09/2020   Procedure: BILIARY DILATION;  Surgeon: Rush Landmark Telford Nab., MD;  Location: [place];  Service: Gastroenterology;;   BILIARY STENT PLACEMENT  03/09/2020   Procedure: BILIARY STENT PLACEMENT;  Surgeon: Irving Copas., MD;  Location: [place];  Service: Gastroenterology;;   BIOPSY  11/20/2019   Procedure: BIOPSY;  Surgeon: Irving Copas., MD;  Location: [place];  Service: Gastroenterology;;   CHOLECYSTECTOMY     COLONOSCOPY WITH PROPOFOL N/A 11/20/2019   Procedure: COLONOSCOPY WITH PROPOFOL;  Surgeon: Irving Copas., MD;  Location: [place];  Service: Gastroenterology;  Laterality: N/A;   ENDOSCOPIC MUCOSAL RESECTION  11/20/2019   Procedure: ENDOSCOPIC MUCOSAL RESECTION;  Surgeon: Rush Landmark, Telford Nab., MD;  Location: [facility] END[facility]COPY;  Service: Gastroenterology;;   ENDOSCOPIC RETROGRADE CHOLANGIOPANCREATOGRAPHY (ERCP) WITH PROPOFOL N/A 03/07/2020   Procedure: ENDOSCOPIC RETROGRADE CHOLANGIOPANCREATOGRAPHY (ERCP) WITH PROPOFOL;  Surgeon: Carol Ada, MD;  Location: [place];  Service: Endoscopy;  Laterality: N/A;   ERCP N/A 03/09/2020   Procedure: ENDOSCOPIC RETROGRADE CHOLANGIOPANCREATOGRAPHY (ERCP);  Surgeon: Irving Copas., MD;  Location: [place];  Service: Gastroenterology;  Laterality:  N/A;   ESOPHAGOGASTRODUODENOSCOPY (EGD) WITH PROPOFOL N/A 11/20/2019   Procedure: ESOPHAGOGASTRODUODENOSCOPY (EGD) WITH PROPOFOL;  Surgeon: Rush Landmark Telford Nab., MD;  Location: [place];  Service: Gastroenterology;  Laterality: N/A;   ESOPHAGOGASTRODUODENOSCOPY (EGD) WITH PROPOFOL N/A 01/20/2020   Procedure: ESOPHAGOGASTRODUODENOSCOPY (EGD) WITH PROPOFOL;  Surgeon: Rush Landmark Telford Nab., MD;  Location: [place];  Service: Gastroenterology;  Laterality: N/A;   EUS N/A 01/20/2020   Procedure: UPPER ENDOSCOPIC ULTRASOUND (EUS) RADIAL;  Surgeon: Irving Copas., MD;  Location: [place];  Service: Gastroenterology;  Laterality: N/A;   FINE NEEDLE ASPIRATION  11/20/2019   Procedure: FINE NEEDLE ASPIRATION (FNA) LINEAR;  Surgeon: Irving Copas., MD;  Location: [place];  Service: Gastroenterology;;   FINE NEEDLE ASPIRATION BIOPSY  01/20/2020   Procedure: FINE NEEDLE ASPIRATION BIOPSY;  Surgeon: Irving Copas., MD;  Location: [place];  Service: Gastroenterology;;   HEMOSTASIS CLIP PLACEMENT  11/20/2019   Procedure: HEMOSTASIS CLIP PLACEMENT;  Surgeon: Irving Copas., MD;  Location: [place];  Service: Gastroenterology;;   NASAL SINUS SURGERY     POLYPECTOMY  11/20/2019   Procedure: POLYPECTOMY;  Surgeon: Irving Copas., MD;  Location: [place];  Service: Gastroenterology;;   PORTACATH PLACEMENT N/A 03/11/2020   Procedure: INSERTION PORT-A-CATH;  Surgeon: Dwan Bolt, MD;  Location: [place];  Service: General;  Laterality: N/A;  LMA   SPHINCTEROTOMY  03/07/2020   Procedure: Joan [place];  Surgeon: Carol Ada, MD;  Location: [place];  Service: Endoscopy;;   SPHINCTEROTOMY  03/09/2020   Procedure: Joan [place]ans;  Surgeon: Mansouraty, Telford Nab., MD;  Location: [place];  Service: Gastroenterology;;   SUBMUCOSAL LIFTING INJECTION  11/20/2019  Procedure: SUBMUCOSAL LIFTING INJECTION;  Surgeon: Rush Landmark Telford Nab., MD;  Location: [place];  Service: Gastroenterology;;   UPPER ESOPHAGEAL ENDOSCOPIC ULTRASOUND (EUS) N/A 11/20/2019   Procedure: UPPER ESOPHAGEAL ENDOSCOPIC ULTRASOUND (EUS);  Surgeon: Irving Copas., MD;  Location: [place];  Service: Gastroenterology;  Laterality: N/A;   Current Facility-Administered Medications  Medication Dose Route Frequency Provider Last Rate Last Admin   0.9 %  sodium chloride infusion   Intravenous Continuous Mansouraty, Telford Nab., MD       Allergies  Allergen Reactions   Cephalosporins Shortness Of Breath   Erythromycin Shortness Of Breath   Fire Ant Shortness Of Breath   Penicillins Anaphylaxis and Other (See Comments)    "25 years ago"   Family History  Family history unknown: Yes   Social History   Socioeconomic History   Marital status: Married    Spouse name: Not on file   Number of children: Not on file   Years of education: Not on file   Highest education level: Not on file  Occupational History   Not on file  Tobacco Use   Smoking status: Never   Smokeless tobacco: Never  Vaping Use   Vaping Use: Never used  Substance and Sexual Activity   Alcohol use: Not Currently    Comment: occasional wine   Drug use: Never   Sexual activity: Not on file    Comment: Hysterectomy  Other Topics Concern   Not on file  Social History Narrative   Not on file   Social Determinants of Health   Financial Resource Strain: Not on file  Food Insecurity: No Food Insecurity   Worried About Running Out of Food in the Last Year: Never true   Ran Out of Food in the Last Year: Never true  Transportation Needs: No Transportation Needs   Lack of Transportation (Medical): No   Lack of Transportation (Non-Medical): No  Physical Activity: Not on file  Stress: Not on file  Social Connections: Not on file  Intimate Partner Violence: Not At Risk   Fear of Current or Ex-Partner: No   Emotionally Abused: No   Physically Abused: No    Sexually Abused: No    Physical Exam: Vital signs in last 24 hours: Temp:  [97 F (36.1 C)] 97 F (36.1 C) (06/16 0700) Pulse Rate:  [66] 66 (06/16 0700) Resp:  [12] 12 (06/16 0700) BP: (150)/(59) 150/59 (06/16 0700) SpO2:  [100 %] 100 % (06/16 0700) Weight:  [53.2 kg-53.5 kg] 53.2 kg (06/16 0700)   GEN: NAD EYE: Sclerae anicteric ENT: MMM CV: Non-tachycardic GI: Soft, NT/ND NEURO:  Alert & Oriented x 3  Lab Results: No results for input(s): WBC, HGB, HCT, PLT in the last 72 hours. BMET No results for input(s): NA, K, CL, CO2, GLUCOSE, BUN, CREATININE, CALCIUM in the last 72 hours. LFT No results for input(s): PROT, ALBUMIN, AST, ALT, ALKPHOS, BILITOT, BILIDIR, IBILI in the last 72 hours. PT/INR No results for input(s): LABPROT, INR in the last 72 hours.   Impression / Plan: This is a 80 y.o.femalewho presents for EGD/EUS for fiducial placement in setting of pancreatic cancer.  The risks of an EUS including intestinal perforation, bleeding, infection, aspiration, and medication effects were discussed as was the possibility it may not give a definitive diagnosis if a biopsy is performed.  When a biopsy of the pancreas is done as part of the EUS, there is an additional risk of pancreatitis at the rate of about 1-2%.  It was explained that procedure related pancreatitis is typically mild, although it can be severe and even life threatening, which is why we do not perform random pancreatic biopsies and only biopsy a lesion/area we feel is concerning enough to warrant the risk.   The risks and benefits of endoscopic evaluation were discussed with the patient; these include but are not limited to the risk of perforation, infection, bleeding, missed lesions, lack of diagnosis, severe illness requiring hospitalization, as well as anesthesia and sedation related illnesses.  The patient is agreeable to proceed.    [name], MD [facility]  Endoscopy Office # [phone]

## 2020-07-23 NOTE — Anesthesia Postprocedure Evaluation (Signed)
Anesthesia Post Note  Patient: Nancy Hawkins  Procedure(s) Performed: UPPER ENDOSCOPIC ULTRASOUND (EUS) RADIAL FIDUCIAL MARKER PLACEMENT ESOPHAGOGASTRODUODENOSCOPY (EGD) BIOPSY     Patient location during evaluation: Endoscopy Anesthesia Type: MAC Level of consciousness: awake and alert Pain management: pain level controlled Vital Signs Assessment: post-procedure vital signs reviewed and stable Respiratory status: spontaneous breathing, nonlabored ventilation, respiratory function stable and patient connected to nasal cannula oxygen Cardiovascular status: blood pressure returned to baseline and stable Postop Assessment: no apparent nausea or vomiting Anesthetic complications: no   No notable events documented.  Last Vitals:  Vitals:   07/23/20 0834 07/23/20 0844  BP: (!) 144/58 (!) 145/59  Pulse: 65 64  Resp: 12 12  Temp:    SpO2: 100% 100%    Last Pain:  Vitals:   07/23/20 0844  TempSrc:   PainSc: 0-No pain                 Jupiter Kabir L Gust Eugene

## 2020-07-24 LAB — SURGICAL PATHOLOGY

## 2020-07-25 ENCOUNTER — Encounter (HOSPITAL_COMMUNITY): Payer: Self-pay | Admitting: Gastroenterology

## 2020-07-25 ENCOUNTER — Encounter: Payer: Self-pay | Admitting: Gastroenterology

## 2020-07-27 ENCOUNTER — Ambulatory Visit
Admission: RE | Admit: 2020-07-27 | Discharge: 2020-07-27 | Disposition: A | Discharge status: Home / Self Care | Payer: Medicare Other | Source: Ambulatory Visit | Attending: Radiation Oncology | Admitting: Radiation Oncology

## 2020-07-27 ENCOUNTER — Other Ambulatory Visit: Payer: Self-pay

## 2020-07-27 VITALS — BP 120/57 | HR 80 | Temp 97.6°F | Resp 18 | Ht 63.0 in | Wt 116.2 lb

## 2020-07-27 DIAGNOSIS — C250 Malignant neoplasm of head of pancreas: Secondary | ICD-10-CM | POA: Diagnosis present

## 2020-07-27 DIAGNOSIS — Z95828 Presence of other vascular implants and grafts: Secondary | ICD-10-CM

## 2020-07-27 MED ORDER — HEPARIN SOD (PORK) LOCK FLUSH 100 UNIT/ML IV SOLN
500.0000 [IU] | Freq: Once | INTRAVENOUS | Status: AC
  Administered 2020-07-27: 500 [IU] via INTRAVENOUS

## 2020-07-27 MED ORDER — SODIUM CHLORIDE 0.9% FLUSH
10.0000 mL | Freq: Once | INTRAVENOUS | Status: AC
  Administered 2020-07-27: 10 mL via INTRAVENOUS

## 2020-07-27 NOTE — Progress Notes (Signed)
Has armband been applied?  Yes.    Does patient have an allergy to IV contrast dye?: No.   Has patient ever received premedication for IV contrast dye?: No.   Does patient take metformin?: No.  Date of lab work: Jul 01, 2020 BUN: 13 CR: 0.34 eGFR: >60  IV site: subclavian right, condition patent and no redness  Has IV site been added to flowsheet?  Yes.    BP (!) 120/57 (BP Location: Right Arm, Patient Position: Sitting, Cuff Size: Normal)   Pulse 80   Temp 97.6 F (36.4 C) (Temporal)   Resp 18   Ht '5\' 3"'  (1.6 m)   Wt 116 lb 3.2 oz (52.7 kg)   LMP  (LMP Unknown)   SpO2 100%   BMI 20.58 kg/m

## 2020-07-28 ENCOUNTER — Telehealth: Payer: Self-pay

## 2020-07-28 NOTE — Telephone Encounter (Signed)
Nutrition  Called patient for nutrition follow-up.  No answer. Left message with call back number  Marleni Gallardo B. Lanaya Bennis, RD, LDN Registered Dietitian [phone] (mobile)  

## 2020-08-05 ENCOUNTER — Ambulatory Visit: Payer: Medicare Other | Admitting: Radiation Oncology

## 2020-08-06 ENCOUNTER — Ambulatory Visit: Payer: Medicare Other | Admitting: Radiation Oncology

## 2020-08-06 ENCOUNTER — Telehealth: Payer: Self-pay

## 2020-08-06 NOTE — Telephone Encounter (Signed)
Nutrition  Called for nutrition follow-up.  No answer.  Left message with call back number.   Shanequia Kendrick B. Reneta Niehaus, RD, LDN Registered Dietitian [phone] (mobile)  

## 2020-08-07 ENCOUNTER — Ambulatory Visit: Payer: Medicare Other | Admitting: Radiation Oncology

## 2020-08-10 ENCOUNTER — Ambulatory Visit: Payer: Medicare Other | Admitting: Radiation Oncology

## 2020-08-11 ENCOUNTER — Ambulatory Visit: Payer: Medicare Other | Admitting: Radiation Oncology

## 2020-08-12 ENCOUNTER — Ambulatory Visit
Admission: RE | Admit: 2020-08-12 | Discharge: 2020-08-12 | Disposition: A | Discharge status: Home / Self Care | Payer: Medicare Other | Source: Ambulatory Visit | Attending: Radiation Oncology | Admitting: Radiation Oncology

## 2020-08-12 ENCOUNTER — Other Ambulatory Visit: Payer: Self-pay

## 2020-08-12 ENCOUNTER — Ambulatory Visit: Payer: Medicare Other | Admitting: Radiation Oncology

## 2020-08-12 DIAGNOSIS — Z8719 Personal history of other diseases of the digestive system: Secondary | ICD-10-CM | POA: Diagnosis not present

## 2020-08-12 DIAGNOSIS — C250 Malignant neoplasm of head of pancreas: Secondary | ICD-10-CM | POA: Insufficient documentation

## 2020-08-12 DIAGNOSIS — C251 Malignant neoplasm of body of pancreas: Secondary | ICD-10-CM | POA: Diagnosis present

## 2020-08-12 DIAGNOSIS — I10 Essential (primary) hypertension: Secondary | ICD-10-CM | POA: Diagnosis not present

## 2020-08-12 DIAGNOSIS — T451X5A Adverse effect of antineoplastic and immunosuppressive drugs, initial encounter: Secondary | ICD-10-CM | POA: Diagnosis not present

## 2020-08-12 DIAGNOSIS — D6959 Other secondary thrombocytopenia: Secondary | ICD-10-CM | POA: Diagnosis not present

## 2020-08-12 DIAGNOSIS — G620 Drug-induced polyneuropathy: Secondary | ICD-10-CM | POA: Diagnosis not present

## 2020-08-12 DIAGNOSIS — Z79899 Other long term (current) drug therapy: Secondary | ICD-10-CM | POA: Diagnosis not present

## 2020-08-12 DIAGNOSIS — D649 Anemia, unspecified: Secondary | ICD-10-CM | POA: Diagnosis not present

## 2020-08-12 DIAGNOSIS — K644 Residual hemorrhoidal skin tags: Secondary | ICD-10-CM | POA: Diagnosis not present

## 2020-08-12 DIAGNOSIS — I4891 Unspecified atrial fibrillation: Secondary | ICD-10-CM | POA: Diagnosis not present

## 2020-08-13 ENCOUNTER — Ambulatory Visit: Payer: Medicare Other | Admitting: Radiation Oncology

## 2020-08-14 ENCOUNTER — Other Ambulatory Visit: Payer: Self-pay

## 2020-08-14 ENCOUNTER — Ambulatory Visit: Payer: Medicare Other | Admitting: Radiation Oncology

## 2020-08-14 ENCOUNTER — Ambulatory Visit
Admission: RE | Admit: 2020-08-14 | Discharge: 2020-08-14 | Disposition: A | Discharge status: Home / Self Care | Payer: Medicare Other | Source: Ambulatory Visit | Attending: Radiation Oncology | Admitting: Radiation Oncology

## 2020-08-14 DIAGNOSIS — C251 Malignant neoplasm of body of pancreas: Secondary | ICD-10-CM | POA: Diagnosis not present

## 2020-08-17 ENCOUNTER — Inpatient Hospital Stay (HOSPITAL_BASED_OUTPATIENT_CLINIC_OR_DEPARTMENT_OTHER): Payer: Medicare Other | Admitting: Oncology

## 2020-08-17 ENCOUNTER — Inpatient Hospital Stay: Payer: Medicare Other

## 2020-08-17 ENCOUNTER — Other Ambulatory Visit: Payer: Self-pay

## 2020-08-17 ENCOUNTER — Inpatient Hospital Stay: Payer: Medicare Other | Attending: Nurse Practitioner

## 2020-08-17 ENCOUNTER — Ambulatory Visit
Admission: RE | Admit: 2020-08-17 | Discharge: 2020-08-17 | Disposition: A | Discharge status: Home / Self Care | Payer: Medicare Other | Source: Ambulatory Visit | Attending: Radiation Oncology | Admitting: Radiation Oncology

## 2020-08-17 VITALS — BP 121/52 | HR 83 | Temp 97.8°F | Resp 18 | Ht 63.0 in | Wt 116.4 lb

## 2020-08-17 DIAGNOSIS — I10 Essential (primary) hypertension: Secondary | ICD-10-CM | POA: Insufficient documentation

## 2020-08-17 DIAGNOSIS — G620 Drug-induced polyneuropathy: Secondary | ICD-10-CM | POA: Insufficient documentation

## 2020-08-17 DIAGNOSIS — Z8719 Personal history of other diseases of the digestive system: Secondary | ICD-10-CM | POA: Insufficient documentation

## 2020-08-17 DIAGNOSIS — C251 Malignant neoplasm of body of pancreas: Secondary | ICD-10-CM | POA: Diagnosis not present

## 2020-08-17 DIAGNOSIS — C250 Malignant neoplasm of head of pancreas: Secondary | ICD-10-CM

## 2020-08-17 DIAGNOSIS — Z95828 Presence of other vascular implants and grafts: Secondary | ICD-10-CM

## 2020-08-17 DIAGNOSIS — I4891 Unspecified atrial fibrillation: Secondary | ICD-10-CM | POA: Insufficient documentation

## 2020-08-17 DIAGNOSIS — K644 Residual hemorrhoidal skin tags: Secondary | ICD-10-CM | POA: Insufficient documentation

## 2020-08-17 DIAGNOSIS — D649 Anemia, unspecified: Secondary | ICD-10-CM | POA: Insufficient documentation

## 2020-08-17 DIAGNOSIS — T451X5A Adverse effect of antineoplastic and immunosuppressive drugs, initial encounter: Secondary | ICD-10-CM | POA: Insufficient documentation

## 2020-08-17 DIAGNOSIS — D6959 Other secondary thrombocytopenia: Secondary | ICD-10-CM | POA: Insufficient documentation

## 2020-08-17 DIAGNOSIS — Z79899 Other long term (current) drug therapy: Secondary | ICD-10-CM | POA: Insufficient documentation

## 2020-08-17 LAB — CBC WITH DIFFERENTIAL (CANCER CENTER ONLY)
Abs Immature Granulocytes: 0.02 10*3/uL (ref 0.00–0.07)
Basophils Absolute: 0 10*3/uL (ref 0.0–0.1)
Basophils Relative: 0 %
Eosinophils Absolute: 0.1 10*3/uL (ref 0.0–0.5)
Eosinophils Relative: 3 %
HCT: 29 % — ABNORMAL LOW (ref 36.0–46.0)
Hemoglobin: 9.4 g/dL — ABNORMAL LOW (ref 12.0–15.0)
Immature Granulocytes: 0 %
Lymphocytes Relative: 17 %
Lymphs Abs: 0.9 10*3/uL (ref 0.7–4.0)
MCH: 31.2 pg (ref 26.0–34.0)
MCHC: 32.4 g/dL (ref 30.0–36.0)
MCV: 96.3 fL (ref 80.0–100.0)
Monocytes Absolute: 0.5 10*3/uL (ref 0.1–1.0)
Monocytes Relative: 9 %
Neutro Abs: 3.9 10*3/uL (ref 1.7–7.7)
Neutrophils Relative %: 71 %
Platelet Count: 118 10*3/uL — ABNORMAL LOW (ref 150–400)
RBC: 3.01 MIL/uL — ABNORMAL LOW (ref 3.87–5.11)
RDW: 13.8 % (ref 11.5–15.5)
WBC Count: 5.5 10*3/uL (ref 4.0–10.5)
nRBC: 0 % (ref 0.0–0.2)

## 2020-08-17 LAB — CMP (CANCER CENTER ONLY)
ALT: 13 U/L (ref 0–44)
AST: 11 U/L — ABNORMAL LOW (ref 15–41)
Albumin: 3.4 g/dL — ABNORMAL LOW (ref 3.5–5.0)
Alkaline Phosphatase: 114 U/L (ref 38–126)
Anion gap: 8 (ref 5–15)
BUN: 13 mg/dL (ref 8–23)
CO2: 23 mmol/L (ref 22–32)
Calcium: 8 mg/dL — ABNORMAL LOW (ref 8.9–10.3)
Chloride: 109 mmol/L (ref 98–111)
Creatinine: 0.38 mg/dL — ABNORMAL LOW (ref 0.44–1.00)
GFR, Estimated: >60 mL/min (ref >60)
Glucose, Bld: 185 mg/dL — ABNORMAL HIGH (ref 70–99)
Potassium: 3.4 mmol/L — ABNORMAL LOW (ref 3.5–5.1)
Sodium: 140 mmol/L (ref 135–145)
Total Bilirubin: 0.5 mg/dL (ref 0.3–1.2)
Total Protein: 6.2 g/dL — ABNORMAL LOW (ref 6.5–8.1)

## 2020-08-17 MED ORDER — LIDOCAINE-PRILOCAINE 2.5-2.5 % EX CREA
1.0000 | TOPICAL_CREAM | CUTANEOUS | 0 refills | Status: AC | PRN
Start: 2020-08-17 — End: ?

## 2020-08-17 MED ORDER — SODIUM CHLORIDE 0.9% FLUSH
10.0000 mL | Freq: Once | INTRAVENOUS | Status: AC
Start: 2020-08-17 — End: 2020-08-17
  Administered 2020-08-17: 10 mL
  Filled 2020-08-17: qty 10

## 2020-08-17 MED ORDER — TRAMADOL HCL 50 MG PO TABS
50.0000 mg | ORAL_TABLET | Freq: Four times a day (QID) | ORAL | 0 refills | Status: DC | PRN
Start: 2020-08-17 — End: 2020-12-08

## 2020-08-17 MED ORDER — HEPARIN SOD (PORK) LOCK FLUSH 100 UNIT/ML IV SOLN
500.0000 [IU] | Freq: Once | INTRAVENOUS | Status: AC
Start: 2020-08-17 — End: 2020-08-17
  Administered 2020-08-17: 500 [IU]
  Filled 2020-08-17: qty 5

## 2020-08-17 NOTE — Patient Instructions (Signed)
Implanted Port Home Guide An implanted port is a device that is placed under the skin. It is usually placed in the chest. The device can be used to give IV medicine, to take blood, or for dialysis. You may have an implanted port if: You need IV medicine that would be irritating to the small veins in your hands or arms. You need IV medicines, such as antibiotics, for a long period of time. You need IV nutrition for a long period of time. You need dialysis. When you have a port, your health care provider can choose to use the port instead of veins in your arms for these procedures. You may have fewer limitations when using a port than you would if you used other types of long-term IVs, and you will likely be able to return to normal activities afteryour incision heals. An implanted port has two main parts: Reservoir. The reservoir is the part where a needle is inserted to give medicines or draw blood. The reservoir is round. After it is placed, it appears as a small, raised area under your skin. Catheter. The catheter is a thin, flexible tube that connects the reservoir to a vein. Medicine that is inserted into the reservoir goes into the catheter and then into the vein. How is my port accessed? To access your port: A numbing cream may be placed on the skin over the port site. Your health care provider will put on a mask and sterile gloves. The skin over your port will be cleaned carefully with a germ-killing soap and allowed to dry. Your health care provider will gently pinch the port and insert a needle into it. Your health care provider will check for a blood return to make sure the port is in the vein and is not clogged. If your port needs to remain accessed to get medicine continuously (constant infusion), your health care provider will place a clear bandage (dressing) over the needle site. The dressing and needle will need to be changed every week, or as told by your health care provider. What  is flushing? Flushing helps keep the port from getting clogged. Follow instructions from your health care provider about how and when to flush the port. Ports are usually flushed with saline solution or a medicine called heparin. The need for flushing will depend on how the port is used: If the port is only used from time to time to give medicines or draw blood, the port may need to be flushed: Before and after medicines have been given. Before and after blood has been drawn. As part of routine maintenance. Flushing may be recommended every 4-6 weeks. If a constant infusion is running, the port may not need to be flushed. Throw away any syringes in a disposal container that is meant for sharp items (sharps container). You can buy a sharps container from a pharmacy, or you can make one by using an empty hard plastic bottle with a cover. How long will my port stay implanted? The port can stay in for as long as your health care provider thinks it is needed. When it is time for the port to come out, a surgery will be done to remove it. The surgery will be similar to the procedure that was done to putthe port in. Follow these instructions at home:  Flush your port as told by your health care provider. If you need an infusion over several days, follow instructions from your health care provider about how to take   care of your port site. Make sure you: Wash your hands with soap and water before you change your dressing. If soap and water are not available, use alcohol-based hand sanitizer. Change your dressing as told by your health care provider. Place any used dressings or infusion bags into a plastic bag. Throw that bag in the trash. Keep the dressing that covers the needle clean and dry. Do not get it wet. Do not use scissors or sharp objects near the tube. Keep the tube clamped, unless it is being used. Check your port site every day for signs of infection. Check for: Redness, swelling, or  pain. Fluid or blood. Pus or a bad smell. Protect the skin around the port site. Avoid wearing bra straps that rub or irritate the site. Protect the skin around your port from seat belts. Place a soft pad over your chest if needed. Bathe or shower as told by your health care provider. The site may get wet as long as you are not actively receiving an infusion. Return to your normal activities as told by your health care provider. Ask your health care provider what activities are safe for you. Carry a medical alert card or wear a medical alert bracelet at all times. This will let health care providers know that you have an implanted port in case of an emergency. Get help right away if: You have redness, swelling, or pain at the port site. You have fluid or blood coming from your port site. You have pus or a bad smell coming from the port site. You have a fever. Summary Implanted ports are usually placed in the chest for long-term IV access. Follow instructions from your health care provider about flushing the port and changing bandages (dressings). Take care of the area around your port by avoiding clothing that puts pressure on the area, and by watching for signs of infection. Protect the skin around your port from seat belts. Place a soft pad over your chest if needed. Get help right away if you have a fever or you have redness, swelling, pain, drainage, or a bad smell at the port site. This information is not intended to replace advice given to you by your health care provider. Make sure you discuss any questions you have with your healthcare provider. Document Revised: 06/10/2019 Document Reviewed: 06/10/2019 Elsevier Patient Education  2022 [employer] Inc.  

## 2020-08-17 NOTE — Progress Notes (Signed)
[place] OFFICE PROGRESS NOTE   Diagnosis: Pancreas cancer  INTERVAL HISTORY:   [name] begin SBRT on 08/12/2020.  She is tolerating the treatment well.  No nausea.  She has discomfort in the shoulders while on the radiation treatment table.  She takes tramadol prior to treatment.  Objective:  Vital signs in last 24 hours:  Blood pressure (!) 121/52, pulse 83, temperature 97.8 F (36.6 C), temperature source Oral, resp. rate 18, height 5\' 3"  (1.6 m), weight 116 lb 6.4 oz (52.8 kg), SpO2 100 %.   Resp: Lungs clear bilaterally Cardio: Regular rate and rhythm GI: No hepatosplenomegaly, nontender Vascular: No leg edema    Portacath/PICC-without erythema  Lab Results:  Lab Results  Component Value Date   WBC 5.5 08/17/2020   HGB 9.4 (L) 08/17/2020   HCT 29.0 (L) 08/17/2020   MCV 96.3 08/17/2020   PLT 118 (L) 08/17/2020   NEUTROABS 3.9 08/17/2020    CMP  Lab Results  Component Value Date   NA 141 07/01/2020   K 3.6 07/01/2020   CL 110 07/01/2020   CO2 21 (L) 07/01/2020   GLUCOSE 158 (H) 07/01/2020   BUN 13 07/01/2020   CREATININE 0.34 (L) 07/01/2020   CALCIUM 8.6 (L) 07/01/2020   PROT 6.3 (L) 07/01/2020   ALBUMIN 3.6 07/01/2020   AST 13 (L) 07/01/2020   ALT 17 07/01/2020   ALKPHOS 163 (H) 07/01/2020   BILITOT 0.4 07/01/2020   GFRNONAA >60 07/01/2020   GFRAA 109 03/03/2020    Lab Results  Component Value Date   TKW409 338 (H) 07/01/2020     Medications: I have reviewed the patient's current medications.   Assessment/Plan: Pancreas cancer, borderline resectable  CT abdomen/pelvis-inflammation involving the mid body of the pancreas with peripancreatic fat stranding; possible 2.5 x 2.2 cm low-attenuation mass within the mid body of the pancreas, appears to abut the celiac axis as well as the splenic vein.   MRI 11/18/2019-extensive inflammatory fat stranding about the pancreas with a thinly septated multicystic fluid signal lesion of the  anterior pancreatic neck measuring 3.2 x 1.7 cm most consistent with a pancreatic pseudocyst; numerous additional small fluid signal lesions throughout the pancreatic parenchyma, the remaining lesions subcentimeter; central portion of pancreatic duct dilated measuring up to 8 mm without obvious obstructing lesion identified; trace ascites; trace bilateral pleural effusions and associated atelectasis or consolidation.   Colonoscopy 11/20/2019-5 polyps (resected and retrieved- multiple tubular adenomas, negative for high-grade dysplasia); 2 colonic angioectasias; diverticulosis in the entire examined colon; nonbleeding nonthrombosed external and internal hemorrhoids.   EGD/EUS 11/20/2019-few white nummular lesions in the entire esophagus (esophagus biopsy with no significant pathologic findings, no fungal elements); hematin found in the entire examined stomach; gastritis (stomach biopsy showed gastropathy/gastritis); 14 mm semisessile polyp found in the posterior wall of the gastric antrum (hyperplastic polyp); a lesion was identified in the pancreatic body staged T2 N0 MX.  FNA of the pancreas lesion showed benign reactive/reparative changes, prominent inflammation.   EUS procedure 01/20/2020-masslike region identified in the pancreatic head, stage T2 N0 MX.  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 CTs chest/pelvis 01/29/2020-no evidence of metastatic disease CT abdomen/pelvis 03/05/2020-increase in size of pancreas head/uncinate mass, marked narrowing of the adjacent portal vein, patchy consolidation in the right lower lung, mild intrahepatic and extrahepatic biliary ductal dilatation Cycle 1 FOLFOX 03/23/2020 Cycle 2 FOLFOX 04/06/2020, Emend added for delayed nausea after cycle 1 Cycle 3 FOLFIRINOX 04/20/2020 Cycle 4 FOLFIRINOX  05/04/2020, Udenyca Cycle 5 FOLFIRINOX 05/18/2020, Udenyca Cycle 6 FOLFIRINOX 06/02/2020, Udenyca CTs 06/15/2020- decreased size  of pancreatic mass, still with portal vein involvement and extensive upper abdominal collaterals. 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  The upper third of the main bile duct and left and right hepatic ducts and all intrahepatic branches moderately dilated secondary to the stricture.  Biliary sphincterotomy performed.  Covered metal biliary stent placed into the common bile duct. 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  She is completing a course of SBRT to the pancreas.  She appears to be tolerating the treatment well.  She has persistent anemia and mild thrombocytopenia secondary to the course of chemotherapy and radiation.  She will return for an office visit, Port-A-Cath flush, and CBC in 1 month.  I refilled her prescription for tramadol.  Nancy Coder, MD  08/17/2020  2:18 PM

## 2020-08-18 ENCOUNTER — Ambulatory Visit: Payer: Medicare Other | Admitting: Radiation Oncology

## 2020-08-18 ENCOUNTER — Encounter: Payer: Self-pay | Admitting: *Deleted

## 2020-08-18 NOTE — Progress Notes (Signed)
EMLA cream authorized from 08/17/20 to 11/18/20 per [facility]. Pharmacy notified. PA sent to HIM to scan

## 2020-08-19 ENCOUNTER — Ambulatory Visit
Admission: RE | Admit: 2020-08-19 | Discharge: 2020-08-19 | Disposition: A | Discharge status: Home / Self Care | Payer: Medicare Other | Source: Ambulatory Visit | Attending: Radiation Oncology | Admitting: Radiation Oncology

## 2020-08-19 DIAGNOSIS — C251 Malignant neoplasm of body of pancreas: Secondary | ICD-10-CM | POA: Diagnosis not present

## 2020-08-21 ENCOUNTER — Other Ambulatory Visit: Payer: Self-pay

## 2020-08-21 ENCOUNTER — Encounter: Payer: Self-pay | Admitting: Radiation Oncology

## 2020-08-21 ENCOUNTER — Ambulatory Visit
Admission: RE | Admit: 2020-08-21 | Discharge: 2020-08-21 | Disposition: A | Discharge status: Home / Self Care | Payer: Medicare Other | Source: Ambulatory Visit | Attending: Radiation Oncology | Admitting: Radiation Oncology

## 2020-08-21 DIAGNOSIS — C251 Malignant neoplasm of body of pancreas: Secondary | ICD-10-CM | POA: Diagnosis not present

## 2020-09-01 ENCOUNTER — Encounter: Payer: Self-pay | Admitting: Oncology

## 2020-09-01 NOTE — Progress Notes (Signed)
                                                                                                                                                             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  Intent: Curative  Radiation Treatment Dates: 08/12/2020 through 08/21/2020 Site Technique Total Dose (Gy) Dose per Fx (Gy) Completed Fx Beam Energies  Pancreas: Pancreas IMRT 33/33 6.6 5/5 6XFFF   Narrative: The patient tolerated radiation therapy relatively well without skin changes or nausea.  Plan: The patient will receive a call in about one month from the radiation oncology department. She will continue follow up with Dr. Benay Spice as well.   ________________________________________________    Carola Rhine, [facility]

## 2020-09-02 ENCOUNTER — Encounter: Payer: Self-pay | Admitting: Oncology

## 2020-09-02 ENCOUNTER — Other Ambulatory Visit: Payer: Self-pay | Admitting: Oncology

## 2020-09-02 DIAGNOSIS — C250 Malignant neoplasm of head of pancreas: Secondary | ICD-10-CM

## 2020-09-14 ENCOUNTER — Other Ambulatory Visit: Payer: Self-pay

## 2020-09-14 ENCOUNTER — Encounter: Payer: Self-pay | Admitting: Nurse Practitioner

## 2020-09-14 ENCOUNTER — Inpatient Hospital Stay: Payer: Medicare Other

## 2020-09-14 ENCOUNTER — Inpatient Hospital Stay: Payer: Medicare Other | Attending: Nurse Practitioner | Admitting: Nurse Practitioner

## 2020-09-14 VITALS — BP 110/59 | HR 86 | Temp 98.1°F | Resp 20 | Ht 63.0 in | Wt 114.8 lb

## 2020-09-14 DIAGNOSIS — U071 COVID-19: Secondary | ICD-10-CM | POA: Diagnosis not present

## 2020-09-14 DIAGNOSIS — T451X5A Adverse effect of antineoplastic and immunosuppressive drugs, initial encounter: Secondary | ICD-10-CM | POA: Insufficient documentation

## 2020-09-14 DIAGNOSIS — Z8719 Personal history of other diseases of the digestive system: Secondary | ICD-10-CM | POA: Insufficient documentation

## 2020-09-14 DIAGNOSIS — R5383 Other fatigue: Secondary | ICD-10-CM | POA: Insufficient documentation

## 2020-09-14 DIAGNOSIS — I10 Essential (primary) hypertension: Secondary | ICD-10-CM | POA: Diagnosis not present

## 2020-09-14 DIAGNOSIS — G620 Drug-induced polyneuropathy: Secondary | ICD-10-CM | POA: Insufficient documentation

## 2020-09-14 DIAGNOSIS — Z79899 Other long term (current) drug therapy: Secondary | ICD-10-CM | POA: Insufficient documentation

## 2020-09-14 DIAGNOSIS — C250 Malignant neoplasm of head of pancreas: Secondary | ICD-10-CM

## 2020-09-14 DIAGNOSIS — D696 Thrombocytopenia, unspecified: Secondary | ICD-10-CM | POA: Diagnosis not present

## 2020-09-14 DIAGNOSIS — C251 Malignant neoplasm of body of pancreas: Secondary | ICD-10-CM | POA: Diagnosis present

## 2020-09-14 DIAGNOSIS — K644 Residual hemorrhoidal skin tags: Secondary | ICD-10-CM | POA: Diagnosis not present

## 2020-09-14 LAB — CBC WITH DIFFERENTIAL (CANCER CENTER ONLY)
Abs Immature Granulocytes: 0.02 10*3/uL (ref 0.00–0.07)
Basophils Absolute: 0 10*3/uL (ref 0.0–0.1)
Basophils Relative: 0 %
Eosinophils Absolute: 0.1 10*3/uL (ref 0.0–0.5)
Eosinophils Relative: 2 %
HCT: 30.8 % — ABNORMAL LOW (ref 36.0–46.0)
Hemoglobin: 9.9 g/dL — ABNORMAL LOW (ref 12.0–15.0)
Immature Granulocytes: 0 %
Lymphocytes Relative: 11 %
Lymphs Abs: 0.6 10*3/uL — ABNORMAL LOW (ref 0.7–4.0)
MCH: 29.7 pg (ref 26.0–34.0)
MCHC: 32.1 g/dL (ref 30.0–36.0)
MCV: 92.5 fL (ref 80.0–100.0)
Monocytes Absolute: 0.6 10*3/uL (ref 0.1–1.0)
Monocytes Relative: 13 %
Neutro Abs: 3.6 10*3/uL (ref 1.7–7.7)
Neutrophils Relative %: 74 %
Platelet Count: 130 10*3/uL — ABNORMAL LOW (ref 150–400)
RBC: 3.33 MIL/uL — ABNORMAL LOW (ref 3.87–5.11)
RDW: 13.7 % (ref 11.5–15.5)
WBC Count: 4.9 10*3/uL (ref 4.0–10.5)
nRBC: 0 % (ref 0.0–0.2)

## 2020-09-14 LAB — CMP (CANCER CENTER ONLY)
ALT: 13 U/L (ref 0–44)
AST: 12 U/L — ABNORMAL LOW (ref 15–41)
Albumin: 3.5 g/dL (ref 3.5–5.0)
Alkaline Phosphatase: 109 U/L (ref 38–126)
Anion gap: 8 (ref 5–15)
BUN: 19 mg/dL (ref 8–23)
CO2: 23 mmol/L (ref 22–32)
Calcium: 8.8 mg/dL — ABNORMAL LOW (ref 8.9–10.3)
Chloride: 106 mmol/L (ref 98–111)
Creatinine: 0.52 mg/dL (ref 0.44–1.00)
GFR, Estimated: >60 mL/min (ref >60)
Glucose, Bld: 165 mg/dL — ABNORMAL HIGH (ref 70–99)
Potassium: 4.3 mmol/L (ref 3.5–5.1)
Sodium: 137 mmol/L (ref 135–145)
Total Bilirubin: 0.5 mg/dL (ref 0.3–1.2)
Total Protein: 6.8 g/dL (ref 6.5–8.1)

## 2020-09-14 MED ORDER — PANCRELIPASE (LIP-PROT-AMYL) 36000-114000 UNITS PO CPEP: 36000.0000 [IU] | ORAL_CAPSULE | Freq: Three times a day (TID) | ORAL | 2 refills | Status: AC

## 2020-09-14 NOTE — Progress Notes (Signed)
[place] OFFICE PROGRESS NOTE   Diagnosis: Pancreas cancer  INTERVAL HISTORY:   Ms. Tier returns as scheduled.  She completed the course of SBRT on 08/21/2020.  She has occasional gas discomfort.  No nausea or vomiting.  No diarrhea.  She describes her appetite as "so-so".  She thinks it is beginning to improve.  She notes less numbness/cold sensitivity in the fingertips.  She would like to discontinue Remeron due to morning fatigue.  Objective:  Vital signs in last 24 hours:  Blood pressure (!) 110/59, pulse 86, temperature 98.1 F (36.7 C), temperature source Oral, resp. rate 20, height '5\' 3"'$  (1.6 m), weight 114 lb 12.8 oz (52.1 kg), SpO2 100 %.    HEENT: No thrush or ulcers. Resp: Lungs clear bilaterally. Cardio: Regular rate and rhythm. GI: Abdomen soft and nontender.  No hepatosplenomegaly. Vascular: No leg edema. Port-A-Cath without erythema.  Lab Results:  Lab Results  Component Value Date   WBC 4.9 09/14/2020   HGB 9.9 (L) 09/14/2020   HCT 30.8 (L) 09/14/2020   MCV 92.5 09/14/2020   PLT 130 (L) 09/14/2020   NEUTROABS 3.6 09/14/2020    Imaging:  No results found.  Medications: I have reviewed the patient's current medications.  Assessment/Plan: Pancreas cancer, borderline resectable CT abdomen/pelvis-inflammation involving the mid body of the pancreas with peripancreatic fat stranding; possible 2.5 x 2.2 cm low-attenuation mass within the mid body of the pancreas, appears to abut the celiac axis as well as the splenic vein.   MRI 11/18/2019-extensive inflammatory fat stranding about the pancreas with a thinly septated multicystic fluid signal lesion of the anterior pancreatic neck measuring 3.2 x 1.7 cm most consistent with a pancreatic pseudocyst; numerous additional small fluid signal lesions throughout the pancreatic parenchyma, the remaining lesions subcentimeter; central portion of pancreatic duct dilated measuring up to 8 mm without  obvious obstructing lesion identified; trace ascites; trace bilateral pleural effusions and associated atelectasis or consolidation.   Colonoscopy 11/20/2019-5 polyps (resected and retrieved- multiple tubular adenomas, negative for high-grade dysplasia); 2 colonic angioectasias; diverticulosis in the entire examined colon; nonbleeding nonthrombosed external and internal hemorrhoids.   EGD/EUS 11/20/2019-few white nummular lesions in the entire esophagus (esophagus biopsy with no significant pathologic findings, no fungal elements); hematin found in the entire examined stomach; gastritis (stomach biopsy showed gastropathy/gastritis); 14 mm semisessile polyp found in the posterior wall of the gastric antrum (hyperplastic polyp); a lesion was identified in the pancreatic body staged T2 N0 MX.  FNA of the pancreas lesion showed benign reactive/reparative changes, prominent inflammation.   EUS procedure 01/20/2020-masslike region identified in the pancreatic head, stage T2 N0 MX.  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 CTs chest/pelvis 01/29/2020-no evidence of metastatic disease CT abdomen/pelvis 03/05/2020-increase in size of pancreas head/uncinate mass, marked narrowing of the adjacent portal vein, patchy consolidation in the right lower lung, mild intrahepatic and extrahepatic biliary ductal dilatation Cycle 1 FOLFOX 03/23/2020 Cycle 2 FOLFOX 04/06/2020, Emend added for delayed nausea after cycle 1 Cycle 3 FOLFIRINOX 04/20/2020 Cycle 4 FOLFIRINOX 05/04/2020, Udenyca Cycle 5 FOLFIRINOX 05/18/2020, Udenyca Cycle 6 FOLFIRINOX 06/02/2020, Udenyca CTs 06/15/2020- decreased size of pancreatic mass, still with portal vein involvement and extensive upper abdominal collaterals. 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  The upper third of the main bile duct and left and right hepatic ducts and all intrahepatic branches moderately dilated secondary to the stricture.  Biliary sphincterotomy performed.  Covered metal biliary stent placed into the common bile duct. 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 Mangum appears stable.  She completed the course of SBRT a little over 3 weeks ago.  We will check a CA 19-9 tumor marker when she returns in 4 to 6 weeks.  CBC and chemistry panel reviewed.  Hemoglobin is better.  She has stable mild thrombocytopenia.  Refill for Creon sent to her pharmacy.  She would like to discontinue Remeron due to morning fatigue.  She will take every other day for a week, then every third day for a week, then discontinue.  She will return for lab, port flush and follow-up in 4 to 6 weeks.  She will contact the office in the interim with any problems.    Ned Card ANP/GNP-BC   09/14/2020  3:45 PM

## 2020-09-28 ENCOUNTER — Other Ambulatory Visit: Payer: Self-pay

## 2020-09-28 ENCOUNTER — Ambulatory Visit
Admission: RE | Admit: 2020-09-28 | Discharge: 2020-09-28 | Disposition: A | Discharge status: Home / Self Care | Payer: Medicare Other | Source: Ambulatory Visit | Attending: Nurse Practitioner | Admitting: Nurse Practitioner

## 2020-09-28 DIAGNOSIS — C250 Malignant neoplasm of head of pancreas: Secondary | ICD-10-CM

## 2020-09-28 NOTE — Progress Notes (Signed)
  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  Interval Since Last Radiation:  6 weeks   08/12/2020 through 08/21/2020 Site Technique Total Dose (Gy) Dose per Fx (Gy) Completed Fx Beam Energies  Pancreas: Pancreas IMRT 33/33 6.6 5/5 6XFFF    Narrative:  The patient was contacted today for routine follow-up. During treatment she did very well with radiotherapy and did not have significant desquamation.    Impression/Plan: 1.  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 Benay Spice in medical oncology.       Carola Rhine, PAC

## 2020-10-08 ENCOUNTER — Inpatient Hospital Stay: Payer: Medicare Other | Attending: Oncology

## 2020-10-08 NOTE — Progress Notes (Addendum)
Nutrition Follow-up:   Patient with pancreatic cancer.  Patient has completed folfirinox and radiation.    Spoke with patient via phone for nutrition follow-up.  Patient reports that he appetite has improved a little and she has gained weight. Says that she is eating more vegetables and meats now.  Continues to drink 2 premier protein shakes per day.  Says that she has gas more of some days vs others.  Continues to take creon.  Noted wants to stop taking remeron.    Medications: reviewed  Labs: reviewed  Anthropometrics:   Weight 114 lb 12.8 oz on 8/8  116 lb 3.2 oz on 6/20  116 lb on 5/31 121 lb on 4/11 124 lb on 3/28   NUTRITION DIAGNOSIS:  Unintentional weight loss improved per patient report   INTERVENTION:  Continue premier protein shakes Encouraged patient to pick foods with highest calorie and protein to continue weight gain Patient prefers to reach out to RD if needed in the future   NEXT VISIT: no follow-up Patient to continue RD if needed  Mistina Coatney B. [name], [place], [place] Registered Dietitian [phone] (mobile)

## 2020-10-20 ENCOUNTER — Inpatient Hospital Stay: Payer: Medicare Other | Attending: Nurse Practitioner

## 2020-10-20 ENCOUNTER — Inpatient Hospital Stay (HOSPITAL_BASED_OUTPATIENT_CLINIC_OR_DEPARTMENT_OTHER): Payer: Medicare Other | Admitting: Oncology

## 2020-10-20 ENCOUNTER — Inpatient Hospital Stay: Payer: Medicare Other

## 2020-10-20 ENCOUNTER — Other Ambulatory Visit: Payer: Self-pay

## 2020-10-20 VITALS — BP 119/59 | HR 79 | Temp 98.2°F | Resp 20 | Ht 63.0 in | Wt 116.2 lb

## 2020-10-20 DIAGNOSIS — K644 Residual hemorrhoidal skin tags: Secondary | ICD-10-CM | POA: Diagnosis not present

## 2020-10-20 DIAGNOSIS — I10 Essential (primary) hypertension: Secondary | ICD-10-CM | POA: Insufficient documentation

## 2020-10-20 DIAGNOSIS — C251 Malignant neoplasm of body of pancreas: Secondary | ICD-10-CM | POA: Diagnosis present

## 2020-10-20 DIAGNOSIS — E119 Type 2 diabetes mellitus without complications: Secondary | ICD-10-CM | POA: Diagnosis not present

## 2020-10-20 DIAGNOSIS — Z95828 Presence of other vascular implants and grafts: Secondary | ICD-10-CM

## 2020-10-20 DIAGNOSIS — Z8719 Personal history of other diseases of the digestive system: Secondary | ICD-10-CM | POA: Insufficient documentation

## 2020-10-20 DIAGNOSIS — Z8616 Personal history of COVID-19: Secondary | ICD-10-CM | POA: Diagnosis not present

## 2020-10-20 DIAGNOSIS — Z79899 Other long term (current) drug therapy: Secondary | ICD-10-CM | POA: Insufficient documentation

## 2020-10-20 DIAGNOSIS — G893 Neoplasm related pain (acute) (chronic): Secondary | ICD-10-CM | POA: Insufficient documentation

## 2020-10-20 DIAGNOSIS — C250 Malignant neoplasm of head of pancreas: Secondary | ICD-10-CM

## 2020-10-20 DIAGNOSIS — T451X5A Adverse effect of antineoplastic and immunosuppressive drugs, initial encounter: Secondary | ICD-10-CM | POA: Insufficient documentation

## 2020-10-20 DIAGNOSIS — G620 Drug-induced polyneuropathy: Secondary | ICD-10-CM | POA: Diagnosis not present

## 2020-10-20 DIAGNOSIS — M549 Dorsalgia, unspecified: Secondary | ICD-10-CM | POA: Insufficient documentation

## 2020-10-20 LAB — CBC WITH DIFFERENTIAL (CANCER CENTER ONLY)
Abs Immature Granulocytes: 0.03 10*3/uL (ref 0.00–0.07)
Basophils Absolute: 0 10*3/uL (ref 0.0–0.1)
Basophils Relative: 1 %
Eosinophils Absolute: 0.1 10*3/uL (ref 0.0–0.5)
Eosinophils Relative: 2 %
HCT: 29.1 % — ABNORMAL LOW (ref 36.0–46.0)
Hemoglobin: 9.3 g/dL — ABNORMAL LOW (ref 12.0–15.0)
Immature Granulocytes: 1 %
Lymphocytes Relative: 12 %
Lymphs Abs: 0.7 10*3/uL (ref 0.7–4.0)
MCH: 28.4 pg (ref 26.0–34.0)
MCHC: 32 g/dL (ref 30.0–36.0)
MCV: 88.7 fL (ref 80.0–100.0)
Monocytes Absolute: 0.6 10*3/uL (ref 0.1–1.0)
Monocytes Relative: 10 %
Neutro Abs: 4.5 10*3/uL (ref 1.7–7.7)
Neutrophils Relative %: 74 %
Platelet Count: 147 10*3/uL — ABNORMAL LOW (ref 150–400)
RBC: 3.28 MIL/uL — ABNORMAL LOW (ref 3.87–5.11)
RDW: 14.3 % (ref 11.5–15.5)
WBC Count: 6 10*3/uL (ref 4.0–10.5)
nRBC: 0 % (ref 0.0–0.2)

## 2020-10-20 LAB — CMP (CANCER CENTER ONLY)
ALT: 18 U/L (ref 0–44)
AST: 15 U/L (ref 15–41)
Albumin: 3.5 g/dL (ref 3.5–5.0)
Alkaline Phosphatase: 138 U/L — ABNORMAL HIGH (ref 38–126)
Anion gap: 8 (ref 5–15)
BUN: 23 mg/dL (ref 8–23)
CO2: 23 mmol/L (ref 22–32)
Calcium: 8.8 mg/dL — ABNORMAL LOW (ref 8.9–10.3)
Chloride: 108 mmol/L (ref 98–111)
Creatinine: 0.44 mg/dL (ref 0.44–1.00)
GFR, Estimated: >60 mL/min (ref >60)
Glucose, Bld: 214 mg/dL — ABNORMAL HIGH (ref 70–99)
Potassium: 4.1 mmol/L (ref 3.5–5.1)
Sodium: 139 mmol/L (ref 135–145)
Total Bilirubin: 0.5 mg/dL (ref 0.3–1.2)
Total Protein: 6.7 g/dL (ref 6.5–8.1)

## 2020-10-20 MED ORDER — SODIUM CHLORIDE 0.9% FLUSH
10.0000 mL | Freq: Once | INTRAVENOUS | Status: AC
  Administered 2020-10-20: 10 mL

## 2020-10-20 MED ORDER — HEPARIN SOD (PORK) LOCK FLUSH 100 UNIT/ML IV SOLN
500.0000 [IU] | Freq: Once | INTRAVENOUS | Status: AC
  Administered 2020-10-20: 500 [IU]

## 2020-10-20 NOTE — Progress Notes (Signed)
[facility] OFFICE PROGRESS NOTE   Diagnosis: Pancreas cancer  INTERVAL HISTORY:   Nancy Hawkins returns as scheduled.  She recently returned from a trip to [place].  She had "gas "discomfort in the abdomen while traveling.  No consistent pain.  She has mild back pain that is different than the cancer pain.  Objective:  Vital signs in last 24 hours:  Blood pressure (!) 119/59, pulse 79, temperature 98.2 F (36.8 C), temperature source Oral, resp. rate 20, height '5\' 3"'$  (1.6 m), weight 116 lb 3.2 oz (52.7 kg), SpO2 100 %.    Resp: Lungs clear bilaterally Cardio: Regular rate and rhythm GI: Nontender, no mass, no hepatosplenomegaly Vascular: No leg edema  Portacath/PICC-without erythema  Lab Results:  Lab Results  Component Value Date   WBC 6.0 10/20/2020   HGB 9.3 (L) 10/20/2020   HCT 29.1 (L) 10/20/2020   MCV 88.7 10/20/2020   PLT 147 (L) 10/20/2020   NEUTROABS 4.5 10/20/2020    CMP  Lab Results  Component Value Date   NA 139 10/20/2020   K 4.1 10/20/2020   CL 108 10/20/2020   CO2 23 10/20/2020   GLUCOSE 214 (H) 10/20/2020   BUN 23 10/20/2020   CREATININE 0.44 10/20/2020   CALCIUM 8.8 (L) 10/20/2020   PROT 6.7 10/20/2020   ALBUMIN 3.5 10/20/2020   AST 15 10/20/2020   ALT 18 10/20/2020   ALKPHOS 138 (H) 10/20/2020   BILITOT 0.5 10/20/2020   GFRNONAA >60 10/20/2020   GFRAA 109 03/03/2020    Lab Results  Component Value Date   [id number] (H) 07/01/2020     Medications: I have reviewed the patient's current medications.   Assessment/Plan: Pancreas cancer, borderline resectable CT abdomen/pelvis-inflammation involving the mid body of the pancreas with peripancreatic fat stranding; possible 2.5 x 2.2 cm low-attenuation mass within the mid body of the pancreas, appears to abut the celiac axis as well as the splenic vein.   MRI 11/18/2019-extensive inflammatory fat stranding about the pancreas with a thinly septated multicystic fluid signal  lesion of the anterior pancreatic neck measuring 3.2 x 1.7 cm most consistent with a pancreatic pseudocyst; numerous additional small fluid signal lesions throughout the pancreatic parenchyma, the remaining lesions subcentimeter; central portion of pancreatic duct dilated measuring up to 8 mm without obvious obstructing lesion identified; trace ascites; trace bilateral pleural effusions and associated atelectasis or consolidation.   Colonoscopy 11/20/2019-5 polyps (resected and retrieved- multiple tubular adenomas, negative for high-grade dysplasia); 2 colonic angioectasias; diverticulosis in the entire examined colon; nonbleeding nonthrombosed external and internal hemorrhoids.   EGD/EUS 11/20/2019-few white nummular lesions in the entire esophagus (esophagus biopsy with no significant pathologic findings, no fungal elements); hematin found in the entire examined stomach; gastritis (stomach biopsy showed gastropathy/gastritis); 14 mm semisessile polyp found in the posterior wall of the gastric antrum (hyperplastic polyp); a lesion was identified in the pancreatic body staged T2 N0 MX.  FNA of the pancreas lesion showed benign reactive/reparative changes, prominent inflammation.   EUS procedure 01/20/2020-masslike region identified in the pancreatic head, stage T2 N0 MX.  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 CTs chest/pelvis 01/29/2020-no evidence of metastatic disease CT abdomen/pelvis 03/05/2020-increase in size of pancreas head/uncinate mass, marked narrowing of the adjacent portal vein, patchy consolidation in the right lower lung, mild intrahepatic and extrahepatic biliary ductal dilatation Cycle 1 FOLFOX 03/23/2020 Cycle 2 FOLFOX 04/06/2020, Emend added for delayed nausea after cycle 1 Cycle 3 FOLFIRINOX 04/20/2020 Cycle 4  FOLFIRINOX 05/04/2020, Udenyca Cycle 5 FOLFIRINOX 05/18/2020, Udenyca Cycle 6 FOLFIRINOX 06/02/2020, Udenyca CTs 06/15/2020-  decreased size of pancreatic mass, still with portal vein involvement and extensive upper abdominal collaterals. 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  The upper third of the main bile duct and left and right hepatic ducts and all intrahepatic branches moderately dilated secondary to the stricture.  Biliary sphincterotomy performed.  Covered metal biliary stent placed into the common bile duct. Port-A-Cath placement 03/11/2020, Dr. [name] Oxaliplatin neuropathy        Disposition: Nancy Hawkins appears stable.  She is now 2 months out from completing SBRT to the pancreas tumor.  There is no clinical evidence of disease progression.  We will follow-up on the CA 19-9 from today.  She will return for an office visit and Port-A-Cath flush in 6 weeks.  [name], MD  10/20/2020  3:35 PM

## 2020-10-20 NOTE — Patient Instructions (Signed)
Implanted Port Home Guide An implanted port is a device that is placed under the skin. It is usually placed in the chest. The device can be used to give IV medicine, to take blood, or for dialysis. You may have an implanted port if: You need IV medicine that would be irritating to the small veins in your hands or arms. You need IV medicines, such as antibiotics, for a long period of time. You need IV nutrition for a long period of time. You need dialysis. When you have a port, your health care provider can choose to use the port instead of veins in your arms for these procedures. You may have fewer limitations when using a port than you would if you used other types of long-term IVs, and you will likely be able to return to normal activities after your incision heals. An implanted port has two main parts: Reservoir. The reservoir is the part where a needle is inserted to give medicines or draw blood. The reservoir is round. After it is placed, it appears as a small, raised area under your skin. Catheter. The catheter is a thin, flexible tube that connects the reservoir to a vein. Medicine that is inserted into the reservoir goes into the catheter and then into the vein. How is my port accessed? To access your port: A numbing cream may be placed on the skin over the port site. Your health care provider will put on a mask and sterile gloves. The skin over your port will be cleaned carefully with a germ-killing soap and allowed to dry. Your health care provider will gently pinch the port and insert a needle into it. Your health care provider will check for a blood return to make sure the port is in the vein and is not clogged. If your port needs to remain accessed to get medicine continuously (constant infusion), your health care provider will place a clear bandage (dressing) over the needle site. The dressing and needle will need to be changed every week, or as told by your health care provider. What  is flushing? Flushing helps keep the port from getting clogged. Follow instructions from your health care provider about how and when to flush the port. Ports are usually flushed with saline solution or a medicine called heparin. The need for flushing will depend on how the port is used: If the port is only used from time to time to give medicines or draw blood, the port may need to be flushed: Before and after medicines have been given. Before and after blood has been drawn. As part of routine maintenance. Flushing may be recommended every 4-6 weeks. If a constant infusion is running, the port may not need to be flushed. Throw away any syringes in a disposal container that is meant for sharp items (sharps container). You can buy a sharps container from a pharmacy, or you can make one by using an empty hard plastic bottle with a cover. How long will my port stay implanted? The port can stay in for as long as your health care provider thinks it is needed. When it is time for the port to come out, a surgery will be done to remove it. The surgery will be similar to the procedure that was done to put the port in. Follow these instructions at home:  Flush your port as told by your health care provider. If you need an infusion over several days, follow instructions from your health care provider about how   to take care of your port site. Make sure you: Wash your hands with soap and water before you change your dressing. If soap and water are not available, use alcohol-based hand sanitizer. Change your dressing as told by your health care provider. Place any used dressings or infusion bags into a plastic bag. Throw that bag in the trash. Keep the dressing that covers the needle clean and dry. Do not get it wet. Do not use scissors or sharp objects near the tube. Keep the tube clamped, unless it is being used. Check your port site every day for signs of infection. Check for: Redness, swelling, or  pain. Fluid or blood. Pus or a bad smell. Protect the skin around the port site. Avoid wearing bra straps that rub or irritate the site. Protect the skin around your port from seat belts. Place a soft pad over your chest if needed. Bathe or shower as told by your health care provider. The site may get wet as long as you are not actively receiving an infusion. Return to your normal activities as told by your health care provider. Ask your health care provider what activities are safe for you. Carry a medical alert card or wear a medical alert bracelet at all times. This will let health care providers know that you have an implanted port in case of an emergency. Get help right away if: You have redness, swelling, or pain at the port site. You have fluid or blood coming from your port site. You have pus or a bad smell coming from the port site. You have a fever. Summary Implanted ports are usually placed in the chest for long-term IV access. Follow instructions from your health care provider about flushing the port and changing bandages (dressings). Take care of the area around your port by avoiding clothing that puts pressure on the area, and by watching for signs of infection. Protect the skin around your port from seat belts. Place a soft pad over your chest if needed. Get help right away if you have a fever or you have redness, swelling, pain, drainage, or a bad smell at the port site. This information is not intended to replace advice given to you by your health care provider. Make sure you discuss any questions you have with your health care provider. Document Revised: 04/15/2020 Document Reviewed: 06/10/2019 Elsevier Patient Education  2022 Elsevier Inc.  

## 2020-10-21 LAB — CANCER ANTIGEN 19-9: CA 19-9: 1550 U/mL — ABNORMAL HIGH (ref 0–35)

## 2020-11-03 ENCOUNTER — Other Ambulatory Visit: Payer: Self-pay | Admitting: Oncology

## 2020-11-05 ENCOUNTER — Other Ambulatory Visit: Payer: Self-pay | Admitting: Oncology

## 2020-11-05 DIAGNOSIS — C250 Malignant neoplasm of head of pancreas: Secondary | ICD-10-CM

## 2020-12-01 ENCOUNTER — Other Ambulatory Visit: Payer: Self-pay

## 2020-12-01 ENCOUNTER — Inpatient Hospital Stay (HOSPITAL_BASED_OUTPATIENT_CLINIC_OR_DEPARTMENT_OTHER): Payer: Medicare Other | Admitting: Nurse Practitioner

## 2020-12-01 ENCOUNTER — Inpatient Hospital Stay: Payer: Medicare Other

## 2020-12-01 ENCOUNTER — Encounter: Payer: Self-pay | Admitting: Nurse Practitioner

## 2020-12-01 ENCOUNTER — Ambulatory Visit (INDEPENDENT_AMBULATORY_CARE_PROVIDER_SITE_OTHER): Payer: Medicare Other

## 2020-12-01 VITALS — BP 117/59 | HR 84 | Temp 98.7°F | Resp 18 | Ht 63.0 in | Wt 115.6 lb

## 2020-12-01 DIAGNOSIS — C250 Malignant neoplasm of head of pancreas: Secondary | ICD-10-CM

## 2020-12-01 DIAGNOSIS — R600 Localized edema: Secondary | ICD-10-CM

## 2020-12-01 DIAGNOSIS — T451X5A Adverse effect of antineoplastic and immunosuppressive drugs, initial encounter: Secondary | ICD-10-CM | POA: Insufficient documentation

## 2020-12-01 DIAGNOSIS — E119 Type 2 diabetes mellitus without complications: Secondary | ICD-10-CM | POA: Insufficient documentation

## 2020-12-01 DIAGNOSIS — G620 Drug-induced polyneuropathy: Secondary | ICD-10-CM | POA: Insufficient documentation

## 2020-12-01 DIAGNOSIS — Z8616 Personal history of COVID-19: Secondary | ICD-10-CM | POA: Insufficient documentation

## 2020-12-01 DIAGNOSIS — Z79899 Other long term (current) drug therapy: Secondary | ICD-10-CM | POA: Insufficient documentation

## 2020-12-01 DIAGNOSIS — I10 Essential (primary) hypertension: Secondary | ICD-10-CM | POA: Insufficient documentation

## 2020-12-01 DIAGNOSIS — D649 Anemia, unspecified: Secondary | ICD-10-CM | POA: Insufficient documentation

## 2020-12-01 DIAGNOSIS — C251 Malignant neoplasm of body of pancreas: Secondary | ICD-10-CM | POA: Insufficient documentation

## 2020-12-01 LAB — CBC WITH DIFFERENTIAL (CANCER CENTER ONLY)
Abs Immature Granulocytes: 0.05 10*3/uL (ref 0.00–0.07)
Basophils Absolute: 0 10*3/uL (ref 0.0–0.1)
Basophils Relative: 0 %
Eosinophils Absolute: 0 10*3/uL (ref 0.0–0.5)
Eosinophils Relative: 1 %
HCT: 21 % — ABNORMAL LOW (ref 36.0–46.0)
Hemoglobin: 6.8 g/dL — CL (ref 12.0–15.0)
Immature Granulocytes: 1 %
Lymphocytes Relative: 9 %
Lymphs Abs: 0.6 10*3/uL — ABNORMAL LOW (ref 0.7–4.0)
MCH: 27.5 pg (ref 26.0–34.0)
MCHC: 32.4 g/dL (ref 30.0–36.0)
MCV: 85 fL (ref 80.0–100.0)
Monocytes Absolute: 0.7 10*3/uL (ref 0.1–1.0)
Monocytes Relative: 10 %
Neutro Abs: 5.4 10*3/uL (ref 1.7–7.7)
Neutrophils Relative %: 79 %
Platelet Count: 142 10*3/uL — ABNORMAL LOW (ref 150–400)
RBC: 2.47 MIL/uL — ABNORMAL LOW (ref 3.87–5.11)
RDW: 14.4 % (ref 11.5–15.5)
WBC Count: 6.8 10*3/uL (ref 4.0–10.5)
nRBC: 0 % (ref 0.0–0.2)

## 2020-12-01 LAB — FERRITIN: Ferritin: 316 ng/mL — ABNORMAL HIGH (ref 11–307)

## 2020-12-01 LAB — SAMPLE TO BLOOD BANK

## 2020-12-01 MED ORDER — ENOXAPARIN SODIUM 40 MG/0.4ML IJ SOSY
40.0000 mg | PREFILLED_SYRINGE | Freq: Once | INTRAMUSCULAR | Status: AC
  Administered 2020-12-01: 40 mg via SUBCUTANEOUS
  Filled 2020-12-01: qty 0.4

## 2020-12-01 MED ORDER — ENOXAPARIN SODIUM 40 MG/0.4ML IJ SOSY: 40.0000 mg | PREFILLED_SYRINGE | Freq: Two times a day (BID) | INTRAMUSCULAR | 2 refills | Status: DC

## 2020-12-01 NOTE — Progress Notes (Signed)
CRITICAL VALUE STICKER  CRITICAL VALUE:Hgb 6.8  RECEIVER (on-site recipient of call):Jahmir Salo,RN  DATE & TIME NOTIFIED: 12/01/20 @ 1331  MESSENGER (representative from lab):Tarry Kos  MD NOTIFIED: Ned Card, NP  TIME OF NOTIFICATION:1335  RESPONSE: Arrange for transfusion

## 2020-12-01 NOTE — Progress Notes (Signed)
[facility] OFFICE PROGRESS NOTE   Diagnosis: Pancreas cancer  INTERVAL HISTORY:   Nancy Hawkins returns as scheduled.  She feels weak and tired.  She denies bleeding.  No bloody or black bowel movements.  Intermittent "gas" discomfort.  No nausea/vomiting.  She denies chest pain.  Occasional shortness of breath.  Objective:  Vital signs in last 24 hours:  Blood pressure (!) 117/59, pulse 84, temperature 98.7 F (37.1 C), temperature source Oral, resp. rate 18, height 5\' 3"  (1.6 m), weight 115 lb 9.6 oz (52.4 kg), SpO2 100 %.    HEENT: Conjunctival pallor.  No thrush or ulcers. Resp: Lungs clear bilaterally. Cardio: Regular rate and rhythm. GI: Abdomen soft and nontender.  No hepatomegaly.  No mass.  Stool is light brown. Vascular: Trace pitting edema lower leg bilaterally. Skin: Pale appearing. Port-A-Cath without erythema.   Lab Results:  Lab Results  Component Value Date   WBC 6.8 12/01/2020   HGB 6.8 (LL) 12/01/2020   HCT 21.0 (L) 12/01/2020   MCV 85.0 12/01/2020   PLT 142 (L) 12/01/2020   NEUTROABS 5.4 12/01/2020    Imaging:  No results found.  Medications: I have reviewed the patient's current medications.  Assessment/Plan: Pancreas cancer, borderline resectable CT abdomen/pelvis-inflammation involving the mid body of the pancreas with peripancreatic fat stranding; possible 2.5 x 2.2 cm low-attenuation mass within the mid body of the pancreas, appears to abut the celiac axis as well as the splenic vein.   MRI 11/18/2019-extensive inflammatory fat stranding about the pancreas with a thinly septated multicystic fluid signal lesion of the anterior pancreatic neck measuring 3.2 x 1.7 cm most consistent with a pancreatic pseudocyst; numerous additional small fluid signal lesions throughout the pancreatic parenchyma, the remaining lesions subcentimeter; central portion of pancreatic duct dilated measuring up to 8 mm without obvious obstructing lesion  identified; trace ascites; trace bilateral pleural effusions and associated atelectasis or consolidation.   Colonoscopy 11/20/2019-5 polyps (resected and retrieved- multiple tubular adenomas, negative for high-grade dysplasia); 2 colonic angioectasias; diverticulosis in the entire examined colon; nonbleeding nonthrombosed external and internal hemorrhoids.   EGD/EUS 11/20/2019-few white nummular lesions in the entire esophagus (esophagus biopsy with no significant pathologic findings, no fungal elements); hematin found in the entire examined stomach; gastritis (stomach biopsy showed gastropathy/gastritis); 14 mm semisessile polyp found in the posterior wall of the gastric antrum (hyperplastic polyp); a lesion was identified in the pancreatic body staged T2 N0 MX.  FNA of the pancreas lesion showed benign reactive/reparative changes, prominent inflammation.   EUS procedure 01/20/2020-masslike region identified in the pancreatic head, stage T2 N0 MX.  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 CTs chest/pelvis 01/29/2020-no evidence of metastatic disease CT abdomen/pelvis 03/05/2020-increase in size of pancreas head/uncinate mass, marked narrowing of the adjacent portal vein, patchy consolidation in the right lower lung, mild intrahepatic and extrahepatic biliary ductal dilatation Cycle 1 FOLFOX 03/23/2020 Cycle 2 FOLFOX 04/06/2020, Emend added for delayed nausea after cycle 1 Cycle 3 FOLFIRINOX 04/20/2020 Cycle 4 FOLFIRINOX 05/04/2020, Udenyca Cycle 5 FOLFIRINOX 05/18/2020, Udenyca Cycle 6 FOLFIRINOX 06/02/2020, Udenyca CTs 06/15/2020- decreased size of pancreatic mass, still with portal vein involvement and extensive upper abdominal collaterals. 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  The upper third of the main bile duct and left and right hepatic ducts and all intrahepatic branches moderately dilated secondary to the stricture.  Biliary sphincterotomy performed.  Covered metal biliary stent placed into the common bile duct. Port-A-Cath placement 03/11/2020, Dr. [name] Oxaliplatin neuropathy    Disposition: Nancy Hawkins is seen today for routine follow-up.  CBC shows significant anemia, hemoglobin 6.8.  Stool hemoccult positive.  She is symptomatic.  We are making arrangements for a blood transfusion.  Urgent referral made to Dr. Rush Landmark.    She has asymmetric leg edema.  Referring for venous Doppler.    She will return for a CBC and follow-up visit in approximately 1 week.  We are available to see her sooner if needed.  Patient seen with Dr. Benay Spice.   Ned Card ANP/GNP-BC   12/01/2020  2:07 PM  Addendum-Doppler studies confirmed bilateral DVT, age-indeterminate.  This has occurred in the setting of severe anemia/GI bleed.  We reviewed the risk of increased bleeding with anticoagulation.  She agrees to proceed.  Dr. Benay Spice recommends Lovenox 40 mg SQ every 12 hours.  She understands to seek emergency evaluation with evidence of bleeding.  She will return tomorrow morning as scheduled for the blood transfusion.   This was a shared visit with Ned Card.  Nancy Hawkins was interviewed and examined.  She presents with symptomatic anemia and a positive stool Hemoccult.  The anemia is most likely related to chronic GI bleeding.  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  We decided to proceed with Lovenox anticoagulation understanding the risk of increased GI bleeding.  She will return for  a Red cell transfusion tomorrow.  She will call for bleeding.  Nancy Hawkins will return for an office visit and repeat CBC next week.  We will refer her to Dr. Rush Landmark to evaluate for a source of GI blood loss.  I was present for greater than 50% of today's visit.  I performed medical decision making.  Julieanne Manson, MD

## 2020-12-02 ENCOUNTER — Other Ambulatory Visit: Payer: Self-pay

## 2020-12-02 ENCOUNTER — Inpatient Hospital Stay (HOSPITAL_COMMUNITY)
Admission: AD | Admit: 2020-12-02 | Discharge: 2020-12-09 | DRG: 357 | Disposition: A | Discharge status: Home / Self Care | Payer: Medicare Other | Source: Ambulatory Visit | Attending: Internal Medicine | Admitting: Internal Medicine

## 2020-12-02 ENCOUNTER — Inpatient Hospital Stay: Payer: Medicare Other

## 2020-12-02 ENCOUNTER — Encounter (HOSPITAL_COMMUNITY): Payer: Self-pay | Admitting: Internal Medicine

## 2020-12-02 ENCOUNTER — Other Ambulatory Visit: Payer: Self-pay | Admitting: Nurse Practitioner

## 2020-12-02 DIAGNOSIS — G620 Drug-induced polyneuropathy: Secondary | ICD-10-CM | POA: Diagnosis present

## 2020-12-02 DIAGNOSIS — I82493 Acute embolism and thrombosis of other specified deep vein of lower extremity, bilateral: Secondary | ICD-10-CM | POA: Diagnosis not present

## 2020-12-02 DIAGNOSIS — I480 Paroxysmal atrial fibrillation: Secondary | ICD-10-CM

## 2020-12-02 DIAGNOSIS — D62 Acute posthemorrhagic anemia: Secondary | ICD-10-CM | POA: Diagnosis not present

## 2020-12-02 DIAGNOSIS — K5521 Angiodysplasia of colon with hemorrhage: Secondary | ICD-10-CM | POA: Diagnosis present

## 2020-12-02 DIAGNOSIS — K5790 Diverticulosis of intestine, part unspecified, without perforation or abscess without bleeding: Secondary | ICD-10-CM | POA: Diagnosis not present

## 2020-12-02 DIAGNOSIS — Z9221 Personal history of antineoplastic chemotherapy: Secondary | ICD-10-CM

## 2020-12-02 DIAGNOSIS — I10 Essential (primary) hypertension: Secondary | ICD-10-CM | POA: Diagnosis present

## 2020-12-02 DIAGNOSIS — C250 Malignant neoplasm of head of pancreas: Secondary | ICD-10-CM

## 2020-12-02 DIAGNOSIS — K922 Gastrointestinal hemorrhage, unspecified: Secondary | ICD-10-CM | POA: Diagnosis not present

## 2020-12-02 DIAGNOSIS — I82443 Acute embolism and thrombosis of tibial vein, bilateral: Secondary | ICD-10-CM | POA: Diagnosis present

## 2020-12-02 DIAGNOSIS — E871 Hypo-osmolality and hyponatremia: Secondary | ICD-10-CM | POA: Diagnosis present

## 2020-12-02 DIAGNOSIS — Z79899 Other long term (current) drug therapy: Secondary | ICD-10-CM

## 2020-12-02 DIAGNOSIS — K2970 Gastritis, unspecified, without bleeding: Secondary | ICD-10-CM | POA: Diagnosis not present

## 2020-12-02 DIAGNOSIS — T451X5A Adverse effect of antineoplastic and immunosuppressive drugs, initial encounter: Secondary | ICD-10-CM | POA: Diagnosis present

## 2020-12-02 DIAGNOSIS — Z880 Allergy status to penicillin: Secondary | ICD-10-CM

## 2020-12-02 DIAGNOSIS — I824Y3 Acute embolism and thrombosis of unspecified deep veins of proximal lower extremity, bilateral: Secondary | ICD-10-CM

## 2020-12-02 DIAGNOSIS — I8500 Esophageal varices without bleeding: Secondary | ICD-10-CM | POA: Diagnosis present

## 2020-12-02 DIAGNOSIS — K2950 Unspecified chronic gastritis without bleeding: Secondary | ICD-10-CM | POA: Diagnosis present

## 2020-12-02 DIAGNOSIS — K219 Gastro-esophageal reflux disease without esophagitis: Secondary | ICD-10-CM | POA: Diagnosis present

## 2020-12-02 DIAGNOSIS — Z794 Long term (current) use of insulin: Secondary | ICD-10-CM | POA: Diagnosis not present

## 2020-12-02 DIAGNOSIS — K5730 Diverticulosis of large intestine without perforation or abscess without bleeding: Secondary | ICD-10-CM | POA: Diagnosis present

## 2020-12-02 DIAGNOSIS — I82413 Acute embolism and thrombosis of femoral vein, bilateral: Secondary | ICD-10-CM | POA: Diagnosis present

## 2020-12-02 DIAGNOSIS — E119 Type 2 diabetes mellitus without complications: Secondary | ICD-10-CM | POA: Diagnosis present

## 2020-12-02 DIAGNOSIS — K641 Second degree hemorrhoids: Secondary | ICD-10-CM | POA: Diagnosis present

## 2020-12-02 DIAGNOSIS — Z91048 Other nonmedicinal substance allergy status: Secondary | ICD-10-CM

## 2020-12-02 DIAGNOSIS — E7800 Pure hypercholesterolemia, unspecified: Secondary | ICD-10-CM | POA: Diagnosis present

## 2020-12-02 DIAGNOSIS — D630 Anemia in neoplastic disease: Secondary | ICD-10-CM | POA: Diagnosis present

## 2020-12-02 DIAGNOSIS — I82453 Acute embolism and thrombosis of peroneal vein, bilateral: Secondary | ICD-10-CM | POA: Diagnosis present

## 2020-12-02 DIAGNOSIS — Z881 Allergy status to other antibiotic agents status: Secondary | ICD-10-CM | POA: Diagnosis not present

## 2020-12-02 DIAGNOSIS — Z8616 Personal history of COVID-19: Secondary | ICD-10-CM

## 2020-12-02 DIAGNOSIS — C787 Secondary malignant neoplasm of liver and intrahepatic bile duct: Secondary | ICD-10-CM | POA: Diagnosis present

## 2020-12-02 DIAGNOSIS — Z20822 Contact with and (suspected) exposure to covid-19: Secondary | ICD-10-CM | POA: Diagnosis present

## 2020-12-02 DIAGNOSIS — K31811 Angiodysplasia of stomach and duodenum with bleeding: Secondary | ICD-10-CM | POA: Diagnosis not present

## 2020-12-02 DIAGNOSIS — Z7982 Long term (current) use of aspirin: Secondary | ICD-10-CM

## 2020-12-02 DIAGNOSIS — I82403 Acute embolism and thrombosis of unspecified deep veins of lower extremity, bilateral: Secondary | ICD-10-CM | POA: Diagnosis present

## 2020-12-02 DIAGNOSIS — K449 Diaphragmatic hernia without obstruction or gangrene: Secondary | ICD-10-CM | POA: Diagnosis present

## 2020-12-02 DIAGNOSIS — I8510 Secondary esophageal varices without bleeding: Secondary | ICD-10-CM | POA: Diagnosis not present

## 2020-12-02 DIAGNOSIS — K625 Hemorrhage of anus and rectum: Secondary | ICD-10-CM | POA: Diagnosis not present

## 2020-12-02 DIAGNOSIS — T45515A Adverse effect of anticoagulants, initial encounter: Secondary | ICD-10-CM | POA: Diagnosis present

## 2020-12-02 DIAGNOSIS — Z8679 Personal history of other diseases of the circulatory system: Secondary | ICD-10-CM

## 2020-12-02 DIAGNOSIS — K5520 Angiodysplasia of colon without hemorrhage: Secondary | ICD-10-CM | POA: Diagnosis not present

## 2020-12-02 LAB — CMP (CANCER CENTER ONLY)
ALT: 22 U/L (ref 0–44)
AST: 26 U/L (ref 15–41)
Albumin: 2.7 g/dL — ABNORMAL LOW (ref 3.5–5.0)
Alkaline Phosphatase: 317 U/L — ABNORMAL HIGH (ref 38–126)
Anion gap: 10 (ref 5–15)
BUN: 15 mg/dL (ref 8–23)
CO2: 20 mmol/L — ABNORMAL LOW (ref 22–32)
Calcium: 7.9 mg/dL — ABNORMAL LOW (ref 8.9–10.3)
Chloride: 105 mmol/L (ref 98–111)
Creatinine: 0.38 mg/dL — ABNORMAL LOW (ref 0.44–1.00)
GFR, Estimated: >60 mL/min (ref >60)
Glucose, Bld: 168 mg/dL — ABNORMAL HIGH (ref 70–99)
Potassium: 3.6 mmol/L (ref 3.5–5.1)
Sodium: 135 mmol/L (ref 135–145)
Total Bilirubin: 2.1 mg/dL — ABNORMAL HIGH (ref 0.3–1.2)
Total Protein: 5.8 g/dL — ABNORMAL LOW (ref 6.5–8.1)

## 2020-12-02 LAB — HEMOGLOBIN AND HEMATOCRIT, BLOOD
HCT: 27 % — ABNORMAL LOW (ref 36.0–46.0)
Hemoglobin: 8.9 g/dL — ABNORMAL LOW (ref 12.0–15.0)

## 2020-12-02 LAB — CBC WITH DIFFERENTIAL (CANCER CENTER ONLY)
Abs Immature Granulocytes: 0.04 10*3/uL (ref 0.00–0.07)
Basophils Absolute: 0 10*3/uL (ref 0.0–0.1)
Basophils Relative: 0 %
Eosinophils Absolute: 0.1 10*3/uL (ref 0.0–0.5)
Eosinophils Relative: 1 %
HCT: 27.9 % — ABNORMAL LOW (ref 36.0–46.0)
Hemoglobin: 9.2 g/dL — ABNORMAL LOW (ref 12.0–15.0)
Immature Granulocytes: 1 %
Lymphocytes Relative: 9 %
Lymphs Abs: 0.7 10*3/uL (ref 0.7–4.0)
MCH: 28.1 pg (ref 26.0–34.0)
MCHC: 33 g/dL (ref 30.0–36.0)
MCV: 85.3 fL (ref 80.0–100.0)
Monocytes Absolute: 0.8 10*3/uL (ref 0.1–1.0)
Monocytes Relative: 11 %
Neutro Abs: 6 10*3/uL (ref 1.7–7.7)
Neutrophils Relative %: 78 %
Platelet Count: 154 10*3/uL (ref 150–400)
RBC: 3.27 MIL/uL — ABNORMAL LOW (ref 3.87–5.11)
RDW: 14 % (ref 11.5–15.5)
WBC Count: 7.6 10*3/uL (ref 4.0–10.5)
nRBC: 0 % (ref 0.0–0.2)

## 2020-12-02 LAB — GLUCOSE, CAPILLARY
Glucose-Capillary: 300 mg/dL — ABNORMAL HIGH (ref 70–99)
Glucose-Capillary: 279 mg/dL — ABNORMAL HIGH (ref 70–99)

## 2020-12-02 LAB — CANCER ANTIGEN 19-9: CA 19-9: 11154 U/mL — ABNORMAL HIGH (ref 0–35)

## 2020-12-02 LAB — PREPARE RBC (CROSSMATCH)

## 2020-12-02 LAB — ABO/RH: ABO/RH(D): A POS

## 2020-12-02 MED ORDER — HEPARIN SOD (PORK) LOCK FLUSH 100 UNIT/ML IV SOLN: 500.0000 [IU] | Freq: Every day | INTRAVENOUS | Status: DC | PRN

## 2020-12-02 MED ORDER — SODIUM CHLORIDE 0.9% FLUSH
3.0000 mL | Freq: Two times a day (BID) | INTRAVENOUS | Status: DC
  Administered 2020-12-02 – 2020-12-09 (×11): 3 mL via INTRAVENOUS

## 2020-12-02 MED ORDER — INSULIN ASPART 100 UNIT/ML IJ SOLN
0.0000 [IU] | Freq: Three times a day (TID) | INTRAMUSCULAR | Status: DC
  Administered 2020-12-02: 5 [IU] via SUBCUTANEOUS
  Administered 2020-12-03: 1 [IU] via SUBCUTANEOUS
  Administered 2020-12-04 – 2020-12-05 (×3): 2 [IU] via SUBCUTANEOUS
  Administered 2020-12-05 (×2): 1 [IU] via SUBCUTANEOUS
  Administered 2020-12-06: 2 [IU] via SUBCUTANEOUS
  Administered 2020-12-06: 5 [IU] via SUBCUTANEOUS
  Administered 2020-12-06: 2 [IU] via SUBCUTANEOUS
  Administered 2020-12-07 (×2): 3 [IU] via SUBCUTANEOUS
  Administered 2020-12-07: 5 [IU] via SUBCUTANEOUS
  Administered 2020-12-08: 3 [IU] via SUBCUTANEOUS
  Administered 2020-12-08 (×2): 2 [IU] via SUBCUTANEOUS

## 2020-12-02 MED ORDER — SODIUM CHLORIDE 0.9% IV SOLUTION
250.0000 mL | Freq: Once | INTRAVENOUS | Status: AC
  Administered 2020-12-02: 250 mL via INTRAVENOUS

## 2020-12-02 MED ORDER — SODIUM CHLORIDE 0.9% FLUSH
10.0000 mL | Freq: Two times a day (BID) | INTRAVENOUS | Status: DC
  Administered 2020-12-02 – 2020-12-09 (×11): 10 mL

## 2020-12-02 MED ORDER — ACETAMINOPHEN 325 MG PO TABS
650.0000 mg | ORAL_TABLET | Freq: Four times a day (QID) | ORAL | Status: DC | PRN
  Filled 2020-12-02: qty 2

## 2020-12-02 MED ORDER — SODIUM CHLORIDE 0.9% FLUSH: 10.0000 mL | INTRAVENOUS | Status: DC | PRN

## 2020-12-02 MED ORDER — ACETAMINOPHEN 650 MG RE SUPP: 650.0000 mg | Freq: Four times a day (QID) | RECTAL | Status: DC | PRN

## 2020-12-02 MED ORDER — ENOXAPARIN SODIUM 40 MG/0.4ML IJ SOSY
40.0000 mg | PREFILLED_SYRINGE | Freq: Once | INTRAMUSCULAR | Status: AC
  Administered 2020-12-02: 40 mg via SUBCUTANEOUS
  Filled 2020-12-02: qty 0.4

## 2020-12-02 MED ORDER — CHLORHEXIDINE GLUCONATE CLOTH 2 % EX PADS
6.0000 | MEDICATED_PAD | Freq: Every day | CUTANEOUS | Status: DC
  Administered 2020-12-02 – 2020-12-09 (×8): 6 via TOPICAL

## 2020-12-02 MED ORDER — HYDRALAZINE HCL 25 MG PO TABS: 25.0000 mg | ORAL_TABLET | ORAL | Status: DC | PRN

## 2020-12-02 MED ORDER — PANTOPRAZOLE SODIUM 40 MG IV SOLR
40.0000 mg | Freq: Two times a day (BID) | INTRAVENOUS | Status: DC
  Administered 2020-12-02 – 2020-12-04 (×4): 40 mg via INTRAVENOUS
  Filled 2020-12-02 (×4): qty 40

## 2020-12-02 MED ORDER — SODIUM CHLORIDE 0.9 % IV SOLN: INTRAVENOUS | Status: DC

## 2020-12-02 MED ORDER — LABETALOL HCL 5 MG/ML IV SOLN: 10.0000 mg | INTRAVENOUS | Status: DC | PRN

## 2020-12-02 MED ORDER — INSULIN ASPART 100 UNIT/ML IJ SOLN
0.0000 [IU] | Freq: Every day | INTRAMUSCULAR | Status: DC
  Administered 2020-12-02: 3 [IU] via SUBCUTANEOUS
  Administered 2020-12-04 – 2020-12-08 (×2): 2 [IU] via SUBCUTANEOUS

## 2020-12-02 NOTE — Assessment & Plan Note (Addendum)
-   diagnosed after EUS Dec 2021 - s/p SBRT - follows with Dr. Benay Spice - CT A/P on 10/27 shows new numerous hepatic lesions compatible with metastasis  -Evaluated by oncology during hospitalization as well.  Plan is for gemcitabine/Abraxane as outpatient

## 2020-12-02 NOTE — Patient Instructions (Signed)
Blood Transfusion, Adult, Care After This sheet gives you information about how to care for yourself after your procedure. Your doctor may also give you more specific instructions. If you have problems or questions, contact your doctor. What can I expect after the procedure? After the procedure, it is common to have: Bruising and soreness at the IV site. A headache. Follow these instructions at home: Insertion site care   Follow instructions from your doctor about how to take care of your insertion site. This is where an IV tube was put into your vein. Make sure you: Wash your hands with soap and water before and after you change your bandage (dressing). If you cannot use soap and water, use hand sanitizer. Change your bandage as told by your doctor. Check your insertion site every day for signs of infection. Check for: Redness, swelling, or pain. Bleeding from the site. Warmth. Pus or a bad smell. General instructions Take over-the-counter and prescription medicines only as told by your doctor. Rest as told by your doctor. Go back to your normal activities as told by your doctor. Keep all follow-up visits as told by your doctor. This is important. Contact a doctor if: You have itching or red, swollen areas of skin (hives). You feel worried or nervous (anxious). You feel weak after doing your normal activities. You have redness, swelling, warmth, or pain around the insertion site. You have blood coming from the insertion site, and the blood does not stop with pressure. You have pus or a bad smell coming from the insertion site. Get help right away if: You have signs of a serious reaction. This may be coming from an allergy or the body's defense system (immune system). Signs include: Trouble breathing or shortness of breath. Swelling of the face or feeling warm (flushed). Fever or chills. Head, chest, or back pain. Dark pee (urine) or blood in the pee. Widespread rash. Fast  heartbeat. Feeling dizzy or light-headed. You may receive your blood transfusion in an outpatient setting. If so, you will be told whom to contact to report any reactions. These symptoms may be an emergency. Do not wait to see if the symptoms will go away. Get medical help right away. Call your local emergency services (911 in the [place] Do not drive yourself to the hospital. Summary Bruising and soreness at the IV site are common. Check your insertion site every day for signs of infection. Rest as told by your doctor. Go back to your normal activities as told by your doctor. Get help right away if you have signs of a serious reaction. This information is not intended to replace advice given to you by your health care provider. Make sure you discuss any questions you have with your health care provider. Document Revised: 05/21/2020 Document Reviewed: 07/19/2018 Elsevier Patient Education  2022 Elsevier Inc.  

## 2020-12-02 NOTE — Progress Notes (Signed)
Labs entered per provider.

## 2020-12-02 NOTE — Progress Notes (Signed)
Addendum to note by Ned Card earlier today: Nancy Hawkins was seen at the Cancer center yesterday.  She was noted to have severe anemia and a Hemoccult positive stool.  She was also noted to have bilateral leg swelling and a Doppler revealed bilateral lower extremity DVTs.  She was placed on Lovenox anticoagulation.  She returned to the cancer center today for a scheduled transfusion.  She reported maroon stool.  I contact Dr. Marylyn Ishihara to arrange for hospital admission in the setting of recently diagnosed deep vein thromboses and GI bleeding.  Anticoagulation will be placed on hold.  She will need a GI evaluation.  She could have bleeding related to tumor invading the upper GI tract or another etiology.  She may be a candidate for an IVC filter.  I will see her in the a.m. on 12/03/2020.  I appreciate the care from Dr. Marylyn Ishihara.

## 2020-12-02 NOTE — Hospital Course (Addendum)
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 fib ~15 years ago.  She was last seen by oncology, Dr. Benay Spice on 10/20/2020 after having completed SBRT approximately 2 months prior. She then returned on 10/25 for follow-up and was found to have Hgb 6.8 g/dL and was arranged to have blood transfusion (which would take place on 10/26).  She also underwent work-up for lower extremity edema noted on her 10/25 visit with a LE duplex.  This was performed on 12/01/2020 and revealed extensive bilateral lower extremity DVT with age-indeterminate clots.  After risks and benefits were discussed, she decided to start Lovenox.  She had her first dose approximately 1 PM on 12/01/2020 and her second dose around 11 AM on 12/02/2020.  When she presented for blood transfusions on 12/02/2020, she noticed having maroon stools with her bowel movements and mild amount of blood when wiping. Due to this, she was referred for direct admission and GI evaluation given concern for possible involvement from her GI tract related to underlying pancreatic cancer.

## 2020-12-02 NOTE — Assessment & Plan Note (Addendum)
-   BP meds on hold in setting of GI bleed and persistently low/normal blood pressures - Use labetalol or hydralazine as needed - see afib

## 2020-12-02 NOTE — H&P (Addendum)
History and Physical    DAVEAH VARONE NTI:[id number] DOB: 1941-01-21 DOA: 12/02/2020  PCP: Lilian Coma., MD   Chief Complaint: maroon stools  Ms. 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 fib ~15 years ago.  She was last seen by oncology, Dr. Benay Spice on 10/20/2020 after having completed SBRT approximately 2 months prior. She then returned on 10/25 for follow-up and was found to have Hgb 6.8 g/dL and was arranged to have blood transfusion (which would take place on 10/26).  She also underwent work-up for lower extremity edema noted on her 10/25 visit with a LE duplex.  This was performed on 12/01/2020 and revealed extensive bilateral lower extremity DVT with age-indeterminate clots.  After risks and benefits were discussed, she decided to start Lovenox.  She had her first dose approximately 1 PM on 12/01/2020 and her second dose around 11 AM on 12/02/2020.  When she presented for blood transfusions on 12/02/2020, she noticed having maroon stools with her bowel movements and mild amount of blood when wiping. Due to this, she was referred for direct admission and GI evaluation given concern for possible involvement from her GI tract related to underlying pancreatic cancer.  Review of Systems: Review of Systems  Constitutional:  Negative for chills, fever and malaise/fatigue.  HENT: Negative.    Eyes: Negative.   Respiratory:  Negative for cough, hemoptysis, sputum production and shortness of breath.   Cardiovascular:  Positive for leg swelling. Negative for chest pain and palpitations.  Gastrointestinal:  Positive for blood in stool. Negative for abdominal pain, constipation, diarrhea, nausea and vomiting.  Genitourinary:  Negative for dysuria and hematuria.  Musculoskeletal: Negative.   Skin: Negative.   Neurological:  Negative for dizziness and headaches.  Endo/Heme/Allergies:  Bruises/bleeds  easily.  Psychiatric/Behavioral: Negative.      Allergies  Allergen Reactions   Cephalosporins Shortness Of Breath   Erythromycin Shortness Of Breath   Fire Ant Shortness Of Breath   Penicillins Anaphylaxis and Other (See Comments)    "25 years ago"    Past Medical History:  Diagnosis Date   Diabetes mellitus without complication ([place])    type 2   Dysrhythmia    a-fib 15 yrs ago per patient on 01/17/20   GERD (gastroesophageal reflux disease)    Hypercholesteremia    Hypertension    Pancreatic cancer ([place])    Pancreatic mass    Pancreatitis    Hx   Seasonal allergies     Past Surgical History:  Procedure Laterality Date   ABDOMINAL HYSTERECTOMY     BILIARY DILATION  03/09/2020   Procedure: BILIARY DILATION;  Surgeon: Rush Landmark, Telford Nab., MD;  Location: [place];  Service: Gastroenterology;;   BILIARY STENT PLACEMENT  03/09/2020   Procedure: BILIARY STENT PLACEMENT;  Surgeon: Irving Copas., MD;  Location: [place];  Service: Gastroenterology;;   BIOPSY  11/20/2019   Procedure: BIOPSY;  Surgeon: Irving Copas., MD;  Location: [place];  Service: Gastroenterology;;   BIOPSY  07/23/2020   Procedure: BIOPSY;  Surgeon: Irving Copas., MD;  Location: [facility] ENDOSCOPY;  Service: Gastroenterology;;   CHOLECYSTECTOMY     COLONOSCOPY WITH PROPOFOL N/A 11/20/2019   Procedure: COLONOSCOPY WITH PROPOFOL;  Surgeon: Irving Copas., MD;  Location: [employer];  Service: Gastroenterology;  Laterality: N/A;   ENDOSCOPIC MUCOSAL RESECTION  11/20/2019   Procedure: ENDOSCOPIC MUCOSAL RESECTION;  Surgeon: Rush Landmark, Telford Nab., MD;  Location: [facility]  ENDOSCOPY;  Service: Gastroenterology;;   ENDOSCOPIC RETROGRADE CHOLANGIOPANCREATOGRAPHY (ERCP) WITH PROPOFOL N/A 03/07/2020   Procedure: ENDOSCOPIC RETROGRADE CHOLANGIOPANCREATOGRAPHY (ERCP) WITH PROPOFOL;  Surgeon: Carol Ada, MD;  Location: [place];  Service: Endoscopy;  Laterality: N/A;    ERCP N/A 03/09/2020   Procedure: ENDOSCOPIC RETROGRADE CHOLANGIOPANCREATOGRAPHY (ERCP);  Surgeon: Irving Copas., MD;  Location: [place];  Service: Gastroenterology;  Laterality: N/A;   ESOPHAGOGASTRODUODENOSCOPY N/A 07/23/2020   Procedure: ESOPHAGOGASTRODUODENOSCOPY (EGD);  Surgeon: Irving Copas., MD;  Location: [place];  Service: Gastroenterology;  Laterality: N/A;   ESOPHAGOGASTRODUODENOSCOPY (EGD) WITH PROPOFOL N/A 11/20/2019   Procedure: ESOPHAGOGASTRODUODENOSCOPY (EGD) WITH PROPOFOL;  Surgeon: Rush Landmark Telford Nab., MD;  Location: [place];  Service: Gastroenterology;  Laterality: N/A;   ESOPHAGOGASTRODUODENOSCOPY (EGD) WITH PROPOFOL N/A 01/20/2020   Procedure: ESOPHAGOGASTRODUODENOSCOPY (EGD) WITH PROPOFOL;  Surgeon: Rush Landmark Telford Nab., MD;  Location: [place];  Service: Gastroenterology;  Laterality: N/A;   EUS N/A 01/20/2020   Procedure: UPPER ENDOSCOPIC ULTRASOUND (EUS) RADIAL;  Surgeon: Irving Copas., MD;  Location: [place];  Service: Gastroenterology;  Laterality: N/A;   EUS N/A 07/23/2020   Procedure: UPPER ENDOSCOPIC ULTRASOUND (EUS) RADIAL;  Surgeon: Irving Copas., MD;  Location: [place];  Service: Gastroenterology;  Laterality: N/A;   FIDUCIAL MARKER PLACEMENT N/A 07/23/2020   Procedure: FIDUCIAL MARKER PLACEMENT;  Surgeon: Irving Copas., MD;  Location: [place];  Service: Gastroenterology;  Laterality: N/A;   FINE NEEDLE ASPIRATION  11/20/2019   Procedure: FINE NEEDLE ASPIRATION (FNA) LINEAR;  Surgeon: Irving Copas., MD;  Location: [place];  Service: Gastroenterology;;   FINE NEEDLE ASPIRATION BIOPSY  01/20/2020   Procedure: FINE NEEDLE ASPIRATION BIOPSY;  Surgeon: Irving Copas., MD;  Location: [place];  Service: Gastroenterology;;   HEMOSTASIS CLIP PLACEMENT  11/20/2019   Procedure: HEMOSTASIS CLIP PLACEMENT;  Surgeon: Irving Copas., MD;  Location: [place];  Service: Gastroenterology;;   NASAL SINUS SURGERY     POLYPECTOMY  11/20/2019   Procedure: POLYPECTOMY;  Surgeon: Irving Copas., MD;  Location: [place];  Service: Gastroenterology;;   PORTACATH PLACEMENT N/A 03/11/2020   Procedure: INSERTION PORT-A-CATH;  Surgeon: Dwan Bolt, MD;  Location: [place];  Service: General;  Laterality: N/A;  LMA   SPHINCTEROTOMY  03/07/2020   Procedure: Joan Mayans;  Surgeon: Carol Ada, MD;  Location: [place];  Service: Endoscopy;;   SPHINCTEROTOMY  03/09/2020   Procedure: Joan Mayans;  Surgeon: Mansouraty, Telford Nab., MD;  Location: [place];  Service: Gastroenterology;;   SUBMUCOSAL LIFTING INJECTION  11/20/2019   Procedure: SUBMUCOSAL LIFTING INJECTION;  Surgeon: Irving Copas., MD;  Location: [place];  Service: Gastroenterology;;   UPPER ESOPHAGEAL ENDOSCOPIC ULTRASOUND (EUS) N/A 11/20/2019   Procedure: UPPER ESOPHAGEAL ENDOSCOPIC ULTRASOUND (EUS);  Surgeon: Irving Copas., MD;  Location: [place];  Service: Gastroenterology;  Laterality: N/A;     reports that she has never smoked. She has never used smokeless tobacco. She reports current alcohol use. She reports that she does not use drugs.  Family History  Family history unknown: Yes    Prior to Admission medications   Medication Sig Start Date End Date Taking? Authorizing Provider  ACCU-CHEK GUIDE test strip  02/17/20   [provider]  Cholecalciferol (VITAMIN D3) 25 MCG (1000 UT) CAPS Take 1,000 Units by mouth daily.    [provider]  Cyanocobalamin (VITAMIN B-12) 2500 MCG SUBL Place 2,500 mcg under the tongue daily.    [provider]  dicyclomine (BENTYL) 20 MG tablet Take  1 tablet (20 mg total) by mouth every 6 (six) hours as needed for spasms. Patient not taking: No sig reported 05/29/20   Ladell Pier, MD  diltiazem [facility]) 120 MG 24 hr capsule Take 120 mg by mouth daily.    [provider]  enoxaparin (LOVENOX) 40 MG/0.4ML injection Inject 0.4 mLs (40 mg total) into the skin every 12 (twelve) hours. 12/01/20   Owens Shark, NP  Ensure Max Protein (ENSURE MAX PROTEIN) LIQD Take 330 mLs (11 oz total) by mouth 2 (two) times daily with a meal. Patient taking differently: Take 11 oz by mouth daily as needed (Supplement). 11/28/19   Geradine Girt, DO  famotidine (PEPCID) 40 MG tablet Take 40 mg by mouth daily as needed for heartburn or indigestion.    [provider]  fexofenadine (ALLEGRA) 180 MG tablet Take 180 mg by mouth in the morning.    [provider]  guaiFENesin (MUCINEX) 600 MG 12 hr tablet Take 600 mg by mouth daily.    [provider]  insulin aspart (NOVOLOG) 100 UNIT/ML FlexPen Inject 3 Units into the skin 3 (three) times daily with meals. Patient taking differently: Inject 0-3 Units into the skin 3 (three) times daily before meals. Sliding Scale 11/28/19   Geradine Girt, DO  Insulin Glargine (BASAGLAR KWIKPEN) 100 UNIT/ML Inject 12 Units into the skin daily. Patient taking differently: Inject 0-12 Units into the skin at bedtime. Sliding scale 11/28/19   Geradine Girt, DO  KLOR-CON M20 20 MEQ tablet TAKE 1 TABLET BY MOUTH EVERY DAY 11/05/20   Ladell Pier, MD  lidocaine-prilocaine (EMLA) cream Apply 1 application topically as needed. Apply over port 45 minutes prior to having port accessed for chemotherapy treatment. 08/17/20   Ladell Pier, MD  lipase/protease/amylase (CREON) 36000 UNITS CPEP capsule Take 1 capsule (36,000 Units total) by mouth 3 (three) times daily before meals. TAKE 1 CAPSULE (36,000 UNITS TOTAL) BY MOUTH 3 (THREE) TIMES DAILY BEFORE MEALS. 09/14/20   Owens Shark, NP  mirtazapine (REMERON SOL-TAB) 15 MG disintegrating tablet TAKE 1 TABLET BY MOUTH EVERYDAY AT BEDTIME 11/03/20   Ladell Pier, MD  Multiple Vitamins-Minerals (OCUVITE EYE HEALTH FORMULA PO) Take 1 tablet by mouth daily.    [provider]  Probiotic Product (PROBIOTIC DAILY) CAPS Take 1 capsule by mouth daily.    [provider]  prochlorperazine (COMPAZINE) 10 MG tablet Take 1 tablet (10 mg total) by mouth every 6 (six) hours as needed for nausea or vomiting. 02/03/20   Ladell Pier, MD  telmisartan (MICARDIS) 40 MG tablet Take 40 mg by mouth daily.    [provider]  traMADol (ULTRAM) 50 MG tablet Take 1 tablet (50 mg total) by mouth every 6 (six) hours as needed for moderate pain. 08/17/20   Ladell Pier, MD  triamcinolone (NASACORT) 55 MCG/ACT AERO nasal inhaler Place 2 sprays into the nose daily as needed (for congestion).    [provider]    Physical Exam: Vitals:   12/02/20 1805  BP: 103/80  Pulse: 76  Resp: 18  Temp: 97.7 F (36.5 C)  TempSrc: Oral  SpO2: 100%   Physical Exam Constitutional:      General: She is not in acute distress.    Appearance: Normal appearance. She is not ill-appearing.  HENT:     Head: Normocephalic and atraumatic.     Mouth/Throat:     Mouth: Mucous membranes are moist.  Eyes:  Extraocular Movements: Extraocular movements intact.  Cardiovascular:     Rate and Rhythm: Normal rate and regular rhythm.     Heart sounds: Normal heart sounds.  Pulmonary:     Effort: Pulmonary effort is normal. No respiratory distress.     Breath sounds: Normal breath sounds. No wheezing.  Abdominal:     General: Bowel sounds are normal. There is no distension.     Palpations: Abdomen is soft.     Tenderness: There is no abdominal tenderness.  Musculoskeletal:        General: Normal range of motion.     Cervical back: Normal range of motion and neck supple.     Comments: 1+ B/L LE pitting edema to shins  Skin:    General: Skin is warm and dry.  Neurological:     General: No focal deficit present.     Mental Status: She is alert.  Psychiatric:        Mood and Affect: Mood normal.        Behavior: Behavior normal.     Labs on Admission:  I have personally reviewed the patients's labs and imaging studies.  Assessment/Plan Principal Problem:   GIB (gastrointestinal bleeding) Active Problems:   Primary cancer of head of pancreas (HCC)   DMII (diabetes mellitus, type 2) (HCC)   HTN (hypertension)   History of atrial fibrillation   * GIB (gastrointestinal bleeding) - differential includes increased risk of spontaneous bleeding with addition of Lovenox started 10/25 vs related to underlying pancreatic cancer - Lovenox started 10/25: 1st dose around 1 pm and 2nd dose 10/26 ~ 11am - trend H/H - s/p 2 units PRBC on 10/26; repeat Hgb is 9.2 g/dL (up from 6.8 g/dL) - GI consulted, appreciate assistance - CLD for now, NPO at MN in case of procedure tomorrow - hold BP meds; PRNs ordered - continue IVF  DVT of lower extremity, bilateral ([place]) - Underwent work-up for asymmetric lower extremity edema on 12/01/2020 - Duplex shows:     Right: age indeterminate deep vein thrombosis involving the right femoral vein, right popliteal vein, right peroneal veins, and right posterior tibial veins.    Left: age indeterminate deep vein thrombosis involving the left femoral vein, left popliteal vein, left posterior tibial veins, and left peroneal veins. - Lovenox started 10/25; only had 2 doses prior to admission - holding lovenox in setting of GIB workup   Primary cancer of head of pancreas [facility]) - diagnosed after EUS Dec 2021 - s/p SBRT - follows with Dr. Benay Spice; patient direct admit from cancer center -Alk phos noted to be elevated on current labs compared to prior with also elevated total bili.  Currently AST/ALT unchanged and normal.  Will need to be trended as CA 19-9 showing significant elevation from prior values; concern would be for underlying cancer progression  History of atrial fibrillation - Distant and remote history per patient, approximately 15 years ago - Not on anticoagulation - On Cardizem at home as part of blood  pressure regimen  HTN (hypertension) - BP meds on hold in setting of GI bleed - Use labetalol or hydralazine as needed  DMII (diabetes mellitus, type 2) (HCC) - last A1c 7.2 % on 03/05/20 - repeat A1c now  - SSI and CBG monitoring       Code Status: Full Code   DVT Prophylaxis:   SCDs Start: 12/02/20 1804   Family Communication: husband Admission status: Inpatient Med-Surg  Certification: The appropriate patient status for this patient  is INPATIENT. Inpatient status is judged to be reasonable and necessary in order to provide the required intensity of service to ensure the patient's safety. The patient's presenting symptoms, physical exam findings, and initial radiographic and laboratory data in the context of their chronic comorbidities is felt to place them at high risk for further clinical deterioration. Furthermore, it is not anticipated that the patient will be medically stable for discharge from the hospital within 2 midnights of admission.   * I certify that at the point of admission it is my clinical judgment that the patient will require inpatient hospital care spanning beyond 2 midnights from the point of admission due to high intensity of service, high risk for further deterioration and high frequency of surveillance required.Dwyane Dee MD [facility] If 7PM-7AM, please contact night-coverage [email or web address]  12/02/2020, 6:32 PM

## 2020-12-02 NOTE — Assessment & Plan Note (Addendum)
-   Distant and remote history per patient, approximately 15 years ago - Not on anticoagulation - On Cardizem at home as part of blood pressure regimen - BP has remained low/normal and BP meds have been on hold; might not require full resumption of home meds at discharge

## 2020-12-02 NOTE — Assessment & Plan Note (Addendum)
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

## 2020-12-02 NOTE — Assessment & Plan Note (Addendum)
-   last A1c 7.2 % on 03/05/20 - repeat A1c is 6.9% this admission  - SSI and CBG monitoring

## 2020-12-02 NOTE — Progress Notes (Signed)
Ms. Kaser was in the office today for a blood transfusion.  At the completion of the transfusion she reported a burgundy stool.  Repeat CBC showed hemoglobin 9.2.  She appeared stable.  Vital signs stable.  Arrangements made for hospital admission due to GI bleed in the setting of recent DVT/Lovenox anticoagulation.

## 2020-12-02 NOTE — Assessment & Plan Note (Addendum)
-   Underwent work-up for asymmetric lower extremity edema on 12/01/2020 - Duplex shows:     Right: age indeterminate deep vein thrombosis involving the right femoral vein, right popliteal vein, right peroneal veins, and right posterior tibial veins.    Left: age indeterminate deep vein thrombosis involving the left femoral vein, left popliteal vein, left posterior tibial veins, and left peroneal veins. - Lovenox started 10/25; only had 2 doses prior to admission - discussed with GI and oncology after EGD/CLN on 10/28: plan is to start heparin (no bolus) on Monday 10/31, (if tolerates anticoagulation then possibly Lovenox again); if does have re-bleeding then IVC to be further considered) -Tolerating heparin drip, however subtherapeutic heparin levels, therefore will continue on only heparin drip today with target of therapeutic level, if no signs of bleeding then transition to Lovenox tomorrow and discharging home.  Has outpatient follow-up on Thursday with oncology

## 2020-12-03 ENCOUNTER — Inpatient Hospital Stay (HOSPITAL_COMMUNITY): Payer: Medicare Other

## 2020-12-03 DIAGNOSIS — I82493 Acute embolism and thrombosis of other specified deep vein of lower extremity, bilateral: Secondary | ICD-10-CM | POA: Diagnosis not present

## 2020-12-03 DIAGNOSIS — C250 Malignant neoplasm of head of pancreas: Secondary | ICD-10-CM | POA: Diagnosis not present

## 2020-12-03 DIAGNOSIS — D62 Acute posthemorrhagic anemia: Secondary | ICD-10-CM | POA: Diagnosis not present

## 2020-12-03 DIAGNOSIS — K922 Gastrointestinal hemorrhage, unspecified: Secondary | ICD-10-CM

## 2020-12-03 DIAGNOSIS — K5520 Angiodysplasia of colon without hemorrhage: Secondary | ICD-10-CM

## 2020-12-03 DIAGNOSIS — K5790 Diverticulosis of intestine, part unspecified, without perforation or abscess without bleeding: Secondary | ICD-10-CM

## 2020-12-03 LAB — CBC WITH DIFFERENTIAL/PLATELET
Abs Immature Granulocytes: 0.05 10*3/uL (ref 0.00–0.07)
Basophils Absolute: 0 10*3/uL (ref 0.0–0.1)
Basophils Relative: 1 %
Eosinophils Absolute: 0.1 10*3/uL (ref 0.0–0.5)
Eosinophils Relative: 1 %
HCT: 26.7 % — ABNORMAL LOW (ref 36.0–46.0)
Hemoglobin: 8.8 g/dL — ABNORMAL LOW (ref 12.0–15.0)
Immature Granulocytes: 1 %
Lymphocytes Relative: 10 %
Lymphs Abs: 0.7 10*3/uL (ref 0.7–4.0)
MCH: 28.7 pg (ref 26.0–34.0)
MCHC: 33 g/dL (ref 30.0–36.0)
MCV: 87 fL (ref 80.0–100.0)
Monocytes Absolute: 0.8 10*3/uL (ref 0.1–1.0)
Monocytes Relative: 12 %
Neutro Abs: 4.8 10*3/uL (ref 1.7–7.7)
Neutrophils Relative %: 75 %
Platelets: 136 10*3/uL — ABNORMAL LOW (ref 150–400)
RBC: 3.07 MIL/uL — ABNORMAL LOW (ref 3.87–5.11)
RDW: 14.2 % (ref 11.5–15.5)
WBC: 6.4 10*3/uL (ref 4.0–10.5)
nRBC: 0 % (ref 0.0–0.2)

## 2020-12-03 LAB — TYPE AND SCREEN
ABO/RH(D): A POS
Antibody Screen: NEGATIVE
Unit division: 0
Unit division: 0

## 2020-12-03 LAB — GLUCOSE, CAPILLARY
Glucose-Capillary: 137 mg/dL — ABNORMAL HIGH (ref 70–99)
Glucose-Capillary: 140 mg/dL — ABNORMAL HIGH (ref 70–99)
Glucose-Capillary: 79 mg/dL (ref 70–99)
Glucose-Capillary: 139 mg/dL — ABNORMAL HIGH (ref 70–99)

## 2020-12-03 LAB — BPAM RBC
Blood Product Expiration Date: 202211172359
Blood Product Expiration Date: 202211172359
ISSUE DATE / TIME: 202210260743
ISSUE DATE / TIME: 202210260743
Unit Type and Rh: 6200
Unit Type and Rh: 6200

## 2020-12-03 LAB — HEMOGLOBIN AND HEMATOCRIT, BLOOD
HCT: 28.4 % — ABNORMAL LOW (ref 36.0–46.0)
Hemoglobin: 9.3 g/dL — ABNORMAL LOW (ref 12.0–15.0)

## 2020-12-03 LAB — COMPREHENSIVE METABOLIC PANEL
ALT: 23 U/L (ref 0–44)
AST: 26 U/L (ref 15–41)
Albumin: 2.2 g/dL — ABNORMAL LOW (ref 3.5–5.0)
Alkaline Phosphatase: 277 U/L — ABNORMAL HIGH (ref 38–126)
Anion gap: 6 (ref 5–15)
BUN: 13 mg/dL (ref 8–23)
CO2: 21 mmol/L — ABNORMAL LOW (ref 22–32)
Calcium: 7.8 mg/dL — ABNORMAL LOW (ref 8.9–10.3)
Chloride: 111 mmol/L (ref 98–111)
Creatinine, Ser: 0.45 mg/dL (ref 0.44–1.00)
GFR, Estimated: >60 mL/min (ref >60)
Glucose, Bld: 115 mg/dL — ABNORMAL HIGH (ref 70–99)
Potassium: 3.3 mmol/L — ABNORMAL LOW (ref 3.5–5.1)
Sodium: 138 mmol/L (ref 135–145)
Total Bilirubin: 1.4 mg/dL — ABNORMAL HIGH (ref 0.3–1.2)
Total Protein: 5.5 g/dL — ABNORMAL LOW (ref 6.5–8.1)

## 2020-12-03 LAB — HEMOGLOBIN A1C
Hgb A1c MFr Bld: 6.9 % — ABNORMAL HIGH (ref 4.8–5.6)
Mean Plasma Glucose: 151.33 mg/dL

## 2020-12-03 LAB — RESP PANEL BY RT-PCR (FLU A&B, COVID) ARPGX2
Influenza A by PCR: NEGATIVE
Influenza B by PCR: NEGATIVE
SARS Coronavirus 2 by RT PCR: NEGATIVE

## 2020-12-03 LAB — MAGNESIUM: Magnesium: 1.9 mg/dL (ref 1.7–2.4)

## 2020-12-03 IMAGING — CT CT ABD-PELV W/ CM
3 of 5 series · 16 of 46 positions shown, 18 images · IV contrast (APPLIED)
Comparison: [DATE]

CLINICAL DATA: Anemia, central abdominal pain. Known pancreatic
cancer.

EXAM:
CT ABDOMEN AND PELVIS WITH CONTRAST
TECHNIQUE: Multidetector CT imaging of the abdomen and pelvis was performed
using the standard protocol following bolus administration of
intravenous contrast.
CONTRAST:  80mL OMNIPAQUE IOHEXOL 350 MG/ML SOLN

[Series 2: axial st · axial · 0.83mm/px · z∈[-372,+8]mm · 11 of 92 slices shown, 13 images]
[im 8/92  soft-tissue]
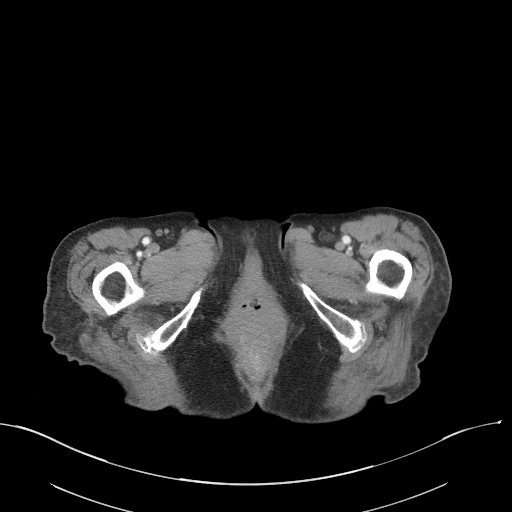
[im 8/92  bone]
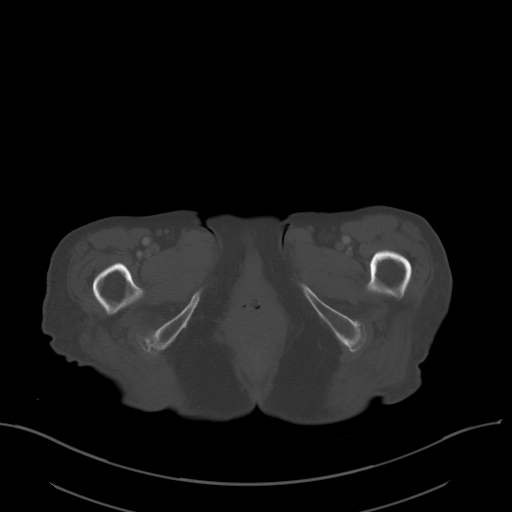
[im 16/92  soft-tissue]
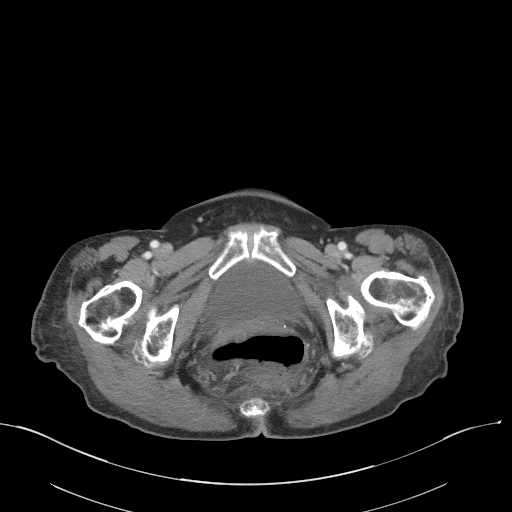
[im 23/92  soft-tissue]
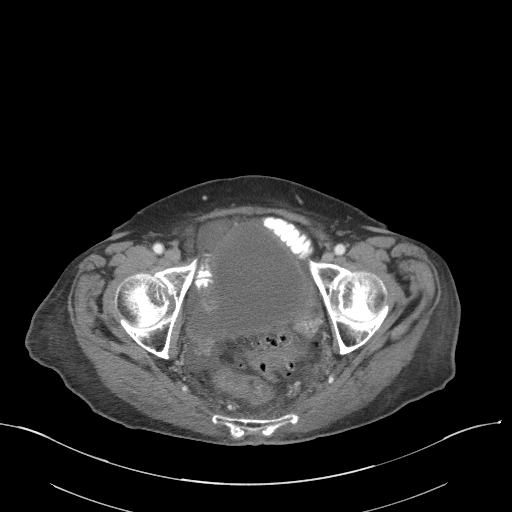
[im 31/92  soft-tissue]
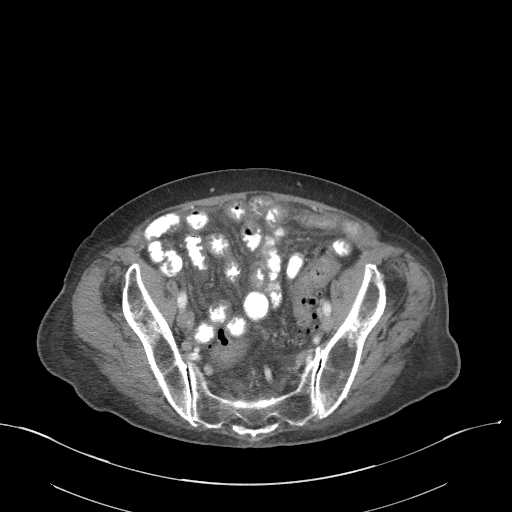
[im 38/92  soft-tissue]
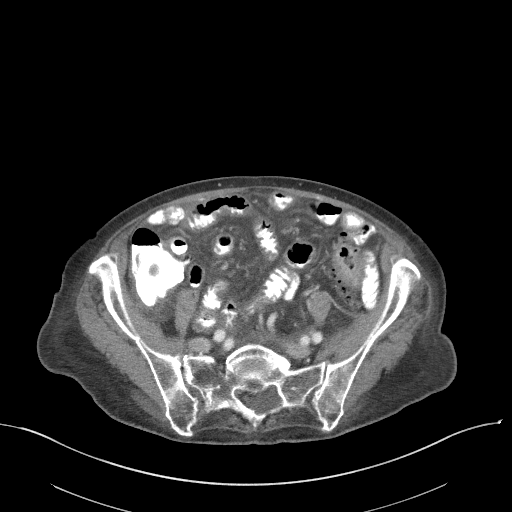
[im 46/92  soft-tissue]
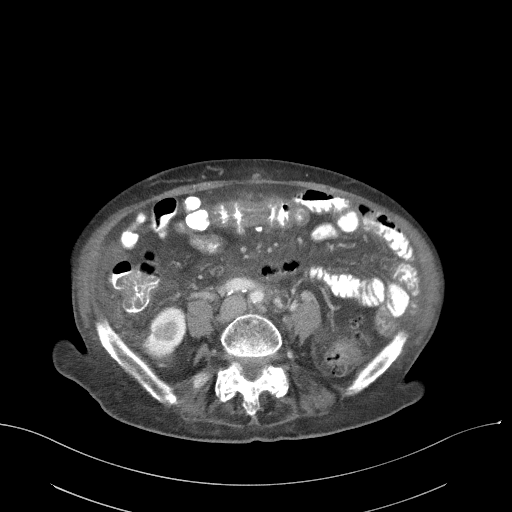
[im 54/92  soft-tissue]
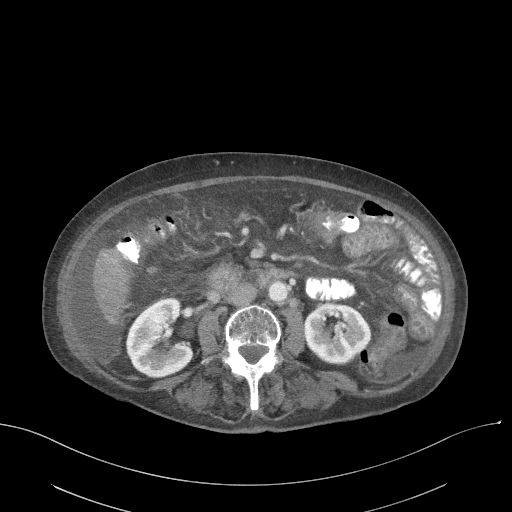
[im 61/92  soft-tissue]
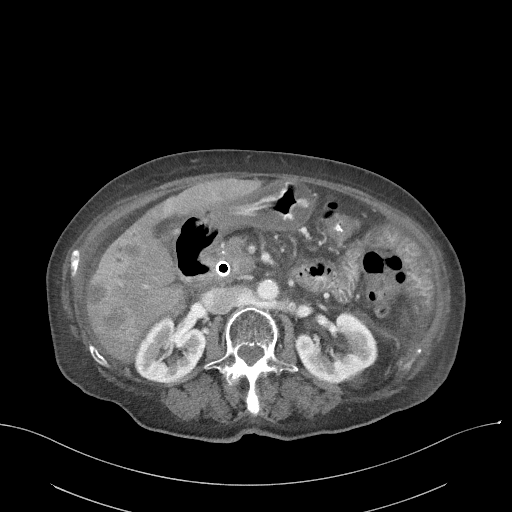
[im 69/92  soft-tissue]
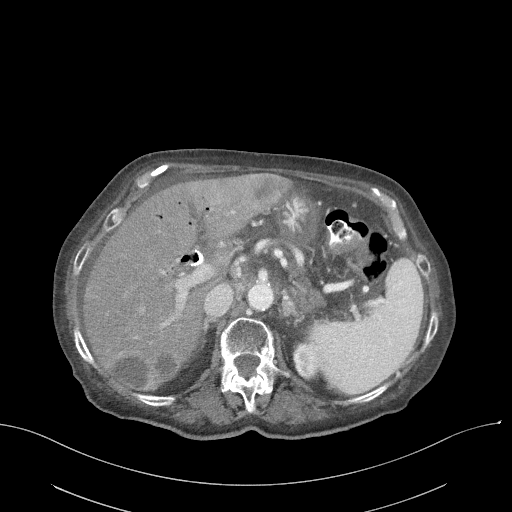
[im 69/92  bone]
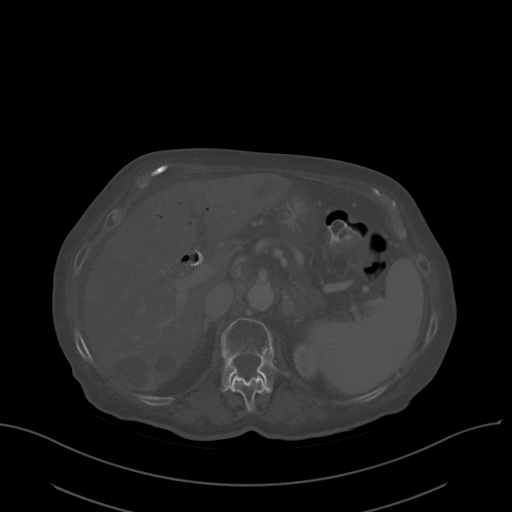
[im 76/92  soft-tissue]
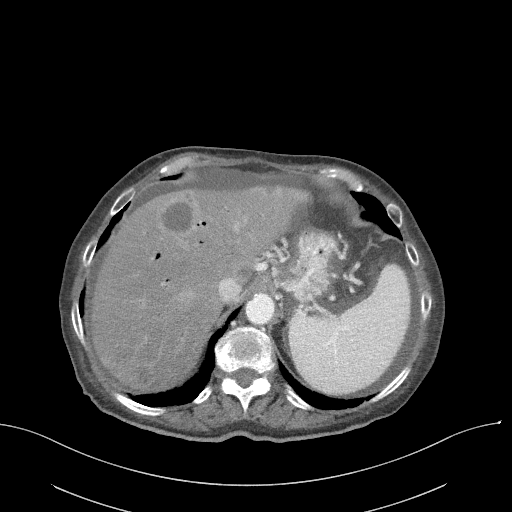
[im 84/92  soft-tissue]
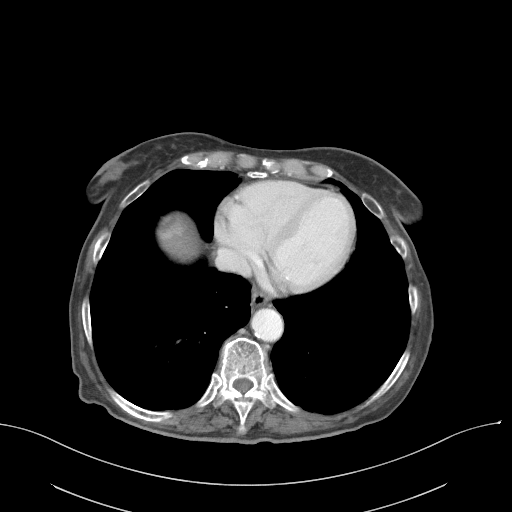

[Series 4: coronal st · coronal · 0.73mm/px · 3 of 85 slices shown]
[im 29/85  soft-tissue]
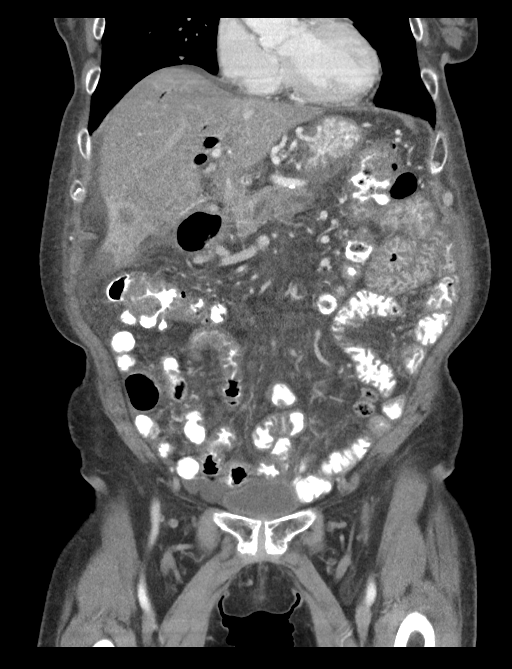
[im 38/85  soft-tissue]
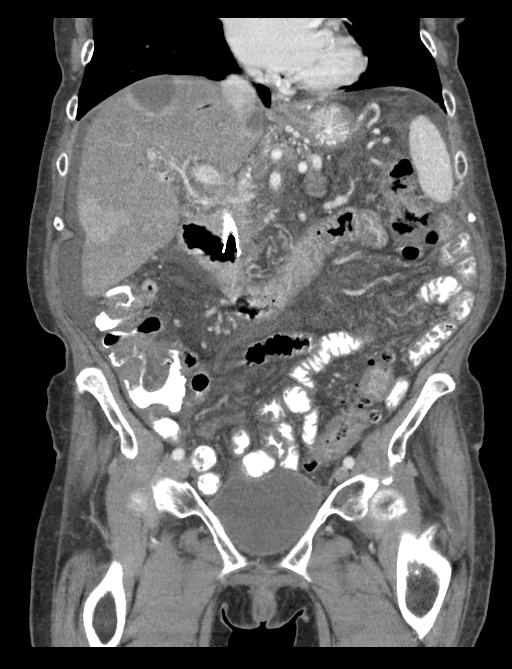
[im 47/85  soft-tissue]
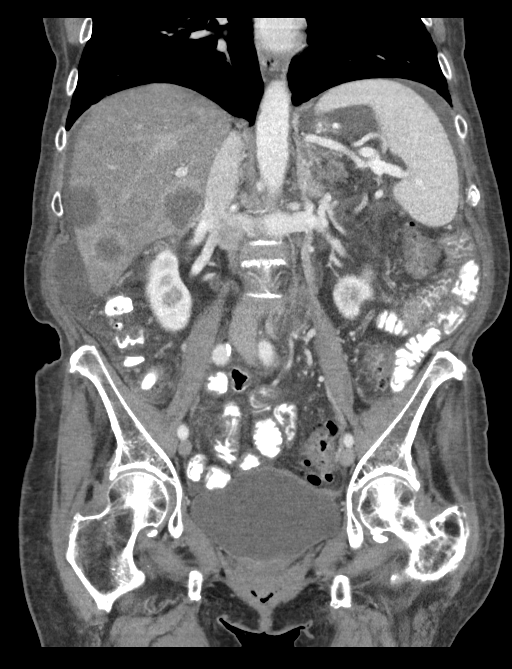

[Series 6: lung bases · axial · 0.83mm/px · z∈[-106,-78]mm · 2 of 84 slices shown]
[im 7/84  bone]
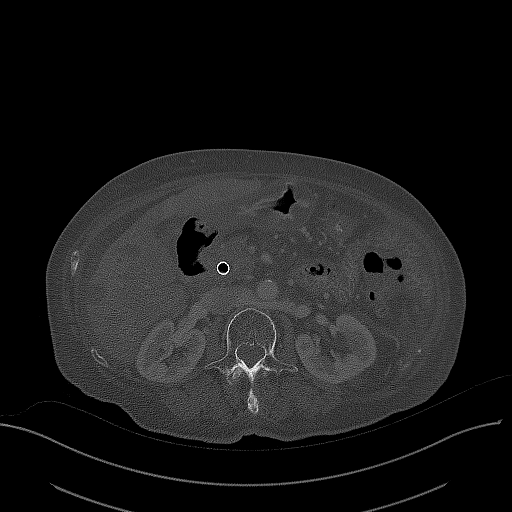
[im 21/84  bone]
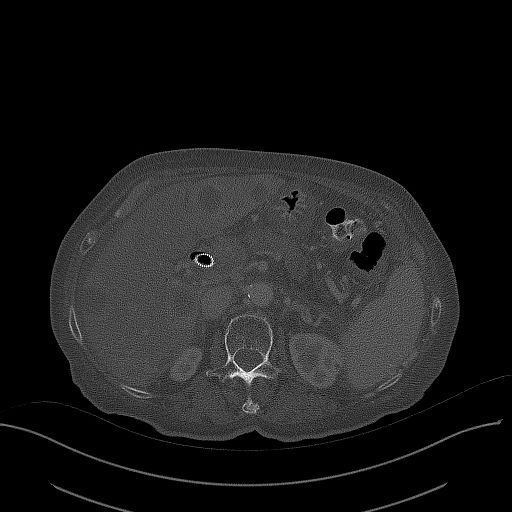

[16 of 46 positions shown; findings below may reference images not displayed]

FINDINGS: Lower chest: Lung bases are clear. No effusions. Heart is normal
size.

Hepatobiliary: Numerous low-density masses with rim enhancement
noted throughout the liver compatible with metastases. The largest
is in the right hepatic dome measuring 3.9 cm. Biliary stent noted
in the common bile duct with pneumobilia. Diffuse low-density
throughout the liver compatible with fatty infiltration. Prior
cholecystectomy.

Pancreas: In the area of the prior mass in the pancreatic head is a
low-density cystic area measuring 1.7 cm. This may reflect necrosis
of the previously cyst in pancreatic head mass. Mild biliary ductal
dilatation.

Spleen: No focal abnormality.  Normal size.

Adrenals/Urinary Tract: No hydronephrosis. No renal or adrenal mass.
Urinary bladder unremarkable.

Stomach/Bowel: Left colonic diverticulosis. No active
diverticulitis. Stomach and small bowel decompressed, unremarkable.

Vascular/Lymphatic: Aortic atherosclerosis. No evidence of aneurysm
or adenopathy.

Reproductive: Prior hysterectomy.  No adnexal masses.

Other: Small amount of free fluid in the abdomen and pelvis. No free
air.

Musculoskeletal: No acute bony abnormality. Stable mild chronic
compression fractures in the lower thoracic spine and upper lumbar
spine.
IMPRESSION: Numerous rim enhancing lesions throughout the liver measuring up to
4 cm compatible with metastases. These are new since prior study.

Hepatic steatosis.

Cystic appearance of the mass in the pancreatic head which could be
related to necrosis.

Biliary stent in place with pneumobilia.

Small amount of free fluid in the abdomen or pelvis.

Left colonic diverticulosis.  No active diverticulitis.

## 2020-12-03 MED ORDER — BISACODYL 5 MG PO TBEC
10.0000 mg | DELAYED_RELEASE_TABLET | Freq: Once | ORAL | Status: AC
  Administered 2020-12-03: 10 mg via ORAL
  Filled 2020-12-03: qty 2

## 2020-12-03 MED ORDER — IOHEXOL 9 MG/ML PO SOLN
ORAL | Status: AC
  Administered 2020-12-03: 500 mL
  Filled 2020-12-03: qty 1000

## 2020-12-03 MED ORDER — BISACODYL 5 MG PO TBEC: 10.0000 mg | DELAYED_RELEASE_TABLET | Freq: Every day | ORAL | Status: DC | PRN

## 2020-12-03 MED ORDER — TRAMADOL HCL 50 MG PO TABS
50.0000 mg | ORAL_TABLET | Freq: Four times a day (QID) | ORAL | Status: DC | PRN
  Administered 2020-12-03 – 2020-12-09 (×7): 50 mg via ORAL
  Filled 2020-12-03 (×7): qty 1

## 2020-12-03 MED ORDER — PEG-KCL-NACL-NASULF-NA ASC-C 100 G PO SOLR: 1.0000 | Freq: Once | ORAL | Status: DC

## 2020-12-03 MED ORDER — POTASSIUM CHLORIDE 10 MEQ/100ML IV SOLN
10.0000 meq | INTRAVENOUS | Status: AC
  Administered 2020-12-03 (×4): 10 meq via INTRAVENOUS
  Filled 2020-12-03 (×4): qty 100

## 2020-12-03 MED ORDER — PEG-KCL-NACL-NASULF-NA ASC-C 100 G PO SOLR
0.5000 | Freq: Once | ORAL | Status: AC
  Administered 2020-12-03: 100 g via ORAL

## 2020-12-03 MED ORDER — IOHEXOL 350 MG/ML SOLN
80.0000 mL | Freq: Once | INTRAVENOUS | Status: AC | PRN
  Administered 2020-12-03: 80 mL via INTRAVENOUS

## 2020-12-03 MED ORDER — PEG-KCL-NACL-NASULF-NA ASC-C 100 G PO SOLR
0.5000 | Freq: Once | ORAL | Status: AC
  Administered 2020-12-04: 100 g via ORAL
  Filled 2020-12-03: qty 1

## 2020-12-03 MED ORDER — IOHEXOL 9 MG/ML PO SOLN
500.0000 mL | ORAL | Status: AC
Start: 2020-12-03 — End: 2020-12-03
  Administered 2020-12-03: 500 mL via ORAL

## 2020-12-03 NOTE — Consult Note (Signed)
Referring Provider: Dr. Dwyane Dee Primary Care Physician:  Lilian Coma., MD Primary Gastroenterologist:  Dr. Rush Landmark   Reason for Consultation:  GI bleed, maroon stools with anemia   HPI: Nancy Hawkins is a 80 y.o. 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 She was seen by Dr. Benay Spice on 12/01/2020 for follow-up and laboratory studies at that time showed a hemoglobin level of 6.8 ( Hg 9.3 on 10/20/2020). She was scheduled to have a blood transfusion on 10/26.  She was also found to have lower extremity edema and a duplex study was positive for extensive bilateral lower extremity DVTs.  She was placed on Lovenox, her first dose 1 PM on 10/25 and her second dose was at 11 AM on 10/26.  She received a blood transfusion at the cancer center on 10/26 and at that time she reported passing a maroon-colored stool.  No associated chest pain, shortness of breath or abdominal pain.  Due to the recent initiation of Lovenox in the setting of GI bleeding, she was sent to [facility] ED for further evaluation and admission.  A GI consult was requested for endoscopic evaluation.  Her hemoglobin level remains stable posttransfusion around 8.9.  WBC 6.4.  Alk phos 277.  AST 26.  ALT 23.  Total bili 1.4. CA19-9 11,154. She reported passing small volumes of stool with maroon-colored blood on and off throughout the night, at least 5-6 episodes. She complains of having mild central abdominal pain which radiates throughout the abdomen which started last night.  No prior history of rectal bleeding or melena.  She typically passes a hard brown stool most days pero nausea or vomiting.  Infrequent  heartburn.  No dysphagia.  She occasionally takes Pepcid.  She is on ASA 81 mg once daily.   She is well known by Dr. Rush Landmark. She initially underwent an EUS by Dr. Rush Landmark 11/20/2019 which identified a lesion to the pancreatic body and cytology was consistent with adenocarcinoma.  An EGD was done at that time as well which showed hematin in the entire stomach and one hyperplastic gastric polyp was resected and clips were placed.  No evidence of H. Pylori.  She underwent a second EGD/EUS 01/20/2020 which showed a 2 cm hiatal hernia.  The EUS showed a masslike region to the pancreatic head cystic with adenocarcinoma.  There were no signs of significant pathology in the CBD duct.  She subsequently developed a biliary obstruction with jaundice and she was admitted to the hospital. She underwent an ERCP by Dr. Benson Norway 03/07/2020 and a pancreatic sphincterotomy was performed.  A repeat ERCP was required by Dr. Rush Landmark on 03/09/2020 which identified a biliary tract obstruction secondary to known pancreatic head mass found in the lower third of the main duct.  The upper third of the main bile duct and left and right hepatic ducts and all intrahepatic branches moderately dilated secondary to the stricture. A biliary sphincterotomy was performed and 1 metal biliary stent was placed to the CBD.  Her most recent EUS was 07/23/2020 which showed the HOP was somewhat distorted secondary to the biliary stent and the appearance of the mass was somewhat different when compared to prior EUS imaging.  Fiducial markers  were deployed.  The EGD component showed reflux esophagitis esophagitis, mild erythema in the stomach and the duodenum was normal.  She completed chemotherapy with FOLFIRINOX 06/2020 and SBRT completed 08/2020.  She is followed by Dr. Benay Spice.  PAST ENDOSCOPIC PROCEDURES:  07/23/2020 EGD/upper endoscopic ultrasound by Dr. Rush Landmark: EGD Impression: - White nummular lesions in esophageal mucosa. Biopsied. -  Z-line regular, 38 cm from the incisors. - Erythematous mucosa in the stomach. - Metal biliary stent emerging from major papilla. - No other gross lesions in the duodenal bulb, in the first portion of the duodenum and in the second portion of the duodenum. EUS Impression: - One stent was visualized endosonographically in the common bile duct with pneumobilia. - The area within the [facility] is distorted due to the metal biliary stent, but the mass-like region was identified within the pancreatic head which is most likely consistent with the pancreatic adenocarcinoma that we have diagnosed previously, but its appearance is different today. There is portal veing abutment and also collateral blood vessels that seem to be in place currently.Fiducial markers were deployed.  ERCP 03/09/2020: - Prior minor papilla sphincterotomy noted. - The major papilla appeared normal. - A biliary tract obstruction secondary to the known pancreatic head mass was found in the lower third of the main duct. - The upper third of the main bile duct and left and right hepatic ducts and all intrahepatic branches were moderately dilated, secondary to the stricture. - A biliary sphincterotomy was performed. - One covered metal biliary stent was placed into the common bile duct. This was dilated to improve the waist of the stent after placement with good effect.  ERCP 03/07/2020: - The major papilla appeared normal. - The minor papilla appeared normal. - A pancreatic sphincterotomy was performed - ERCP on 03/09/2020 with Dr. Rush Landmark   EGD/EUS 01/20/2020: EGD Impression: - No gross lesions in esophagus. - Z-line regular, 35 cm from the incisors. - 2 cm hiatal hernia. - No gross lesions in the stomach other than well-healed scar. - No gross lesions in the duodenal bulb, in the first portion of the duodenum and in the second portion of the duodenum. EUS Impression: - A masslike region was identified in the pancreatic  head and genu of the pancreas. Tissue was obtained from this exam, and results are pending. However, the endosonographic appearance as well as imaging findings are suspicious for adenocarcinoma. This was staged T2 N0 Mx by endosonographic criteria. The staging applies if malignancy is confirmed. Fine needle biopsy performed with results pending. - There was no sign of significant pathology in the common bile duct and in the common hepatic duct. - No malignant-appearing lymph nodes were visualized in the celiac region (level 20), peripancreatic region and porta hepatis region.  EGD/EUS 11/20/2019: EGD Impression: - White nummular lesions in esophageal mucosa. Biopsied for Candida. - Z-line regular, 38 cm from the incisors. - Gastritis. Hematin (altered blood/coffee-ground-like material) in the entire stomach. One gastric polyp, resected and retrieved via mucosal resection and clips (MR conditional) were placed. No other gross lesions in the stomach. Biopsied for HP. - No gross lesions in the duodenal bulb, in the first portion of the duodenum and in the second portion of the duodenum. Biopsied. - Normal ampulla. EUS Impression: - A lesion was identified in the pancreatic body. Cytology results are pending. However, the endosonographic appearance and clinical history is concerning for possible adenocarcinoma. This was staged T2 N0 Mx by endosonographic criteria. The staging applies if malignancy is confirmed.  Fine needle biopsy performed. Other findings of the pancreas parenchyma and duct system are noted above. - A cystic lesion was seen in the pancreatic head. Tissue has not been obtained. However, the endosonographic appearance is suggestive of a pancreatic pseudocyst. - There was no sign of significant pathology in the common bile duct and in the common hepatic duct. - No malignant-appearing lymph nodes were visualized in the celiac region (level 20), peripancreatic region and porta  hepatis region. B. DUODENUM, BIOPSY:  - Duodenal mucosa with no significant pathologic findings.  - Negative for increased intraepithelial lymphocytes and villous  architectural changes.   C. STOMACH, BIOPSY:  - Gastric antral mucosa with mild reactive gastropathy.  - Gastric oxyntic mucosa with mild chronic gastritis.  - Warthin-Starry stain is negative for Helicobacter pylori.   D. ESOPHAGUS, BIOPSY:  - Squamous esophageal epithelium with no significant pathologic  findings.  - Negative for increased intraepithelial eosinophils.  - PAS/F stain is negative for fungal elements.   E. STOMACH, ENDOSCOPIC MUCOSAL RESECTION OF POLYP:  - Hyperplastic polyp.   Colonoscopy 11/20/2019: - Hemorrhoids found on digital rectal exam. - The examined portion of the ileum was normal. - Tortuous colon. Water immersion and maneuvers above allowed safe passage through the region. - Stool in the entire examined colon. Lavaged with adequate visualization. - Five 3 to 14 mm polyps in the transverse colon, at the hepatic flexure, in the ascending colon and at the ileocecal valve, removed with a cold snare. Resected and retrieved. - Two colonic angioectasias. - Diverticulosis in the entire examined colon. - Normal mucosa in the entire examined colon otherwise. - Non-bleeding non-thrombosed external and internal hemorrhoids. A. COLON, ILEOCECAL VALVE, ASCENDING, HEPATIC FLEXURE AND TRANSVERSE,  POLYPECTOMY:  - Tubular adenoma (x multiple).  - Negative for high grade dysplasia.    Past Medical History:  Diagnosis Date   Diabetes mellitus without complication ([place])    type 2   Dysrhythmia    a-fib 15 yrs ago per patient on 01/17/20   GERD (gastroesophageal reflux disease)    Hypercholesteremia    Hypertension    Pancreatic cancer ([place])    Pancreatic mass    Pancreatitis    Hx   Seasonal allergies     Past Surgical History:  Procedure Laterality Date   ABDOMINAL HYSTERECTOMY     BILIARY  DILATION  03/09/2020   Procedure: BILIARY DILATION;  Surgeon: Rush Landmark Telford Nab., MD;  Location: [place];  Service: Gastroenterology;;   BILIARY STENT PLACEMENT  03/09/2020   Procedure: BILIARY STENT PLACEMENT;  Surgeon: Irving Copas., MD;  Location: [place];  Service: Gastroenterology;;   BIOPSY  11/20/2019   Procedure: BIOPSY;  Surgeon: Irving Copas., MD;  Location: [place];  Service: Gastroenterology;;   BIOPSY  07/23/2020   Procedure: BIOPSY;  Surgeon: Irving Copas., MD;  Location: [place];  Service: Gastroenterology;;   CHOLECYSTECTOMY     COLONOSCOPY WITH PROPOFOL N/A 11/20/2019   Procedure: COLONOSCOPY WITH PROPOFOL;  Surgeon: Irving Copas., MD;  Location: [place];  Service: Gastroenterology;  Laterality: N/A;   ENDOSCOPIC MUCOSAL RESECTION  11/20/2019   Procedure: ENDOSCOPIC MUCOSAL RESECTION;  Surgeon: Rush Landmark, Telford Nab., MD;  Location: [facility];  Service: Gastroenterology;;   ENDOSCOPIC RETROGRADE CHOLANGIOPANCREATOGRAPHY (ERCP) WITH PROPOFOL N/A 03/07/2020   Procedure: ENDOSCOPIC RETROGRADE CHOLANGIOPANCREATOGRAPHY (ERCP) WITH PROPOFOL;  Surgeon: Carol Ada, MD;  Location: [place];  Service: Endoscopy;  Laterality: N/A;   ERCP N/A 03/09/2020   Procedure: ENDOSCOPIC RETROGRADE CHOLANGIOPANCREATOGRAPHY (ERCP);  Surgeon: Irving Copas., MD;  Location: [place];  Service: Gastroenterology;  Laterality: N/A;   ESOPHAGOGASTRODUODENOSCOPY N/A 07/23/2020   Procedure: ESOPHAGOGASTRODUODENOSCOPY (EGD);  Surgeon: Irving Copas., MD;  Location: [place];  Service: Gastroenterology;  Laterality: N/A;   ESOPHAGOGASTRODUODENOSCOPY (EGD) WITH PROPOFOL N/A 11/20/2019   Procedure: ESOPHAGOGASTRODUODENOSCOPY (EGD) WITH PROPOFOL;  Surgeon: Rush Landmark Telford Nab., MD;  Location: [place];  Service: Gastroenterology;  Laterality: N/A;   ESOPHAGOGASTRODUODENOSCOPY (EGD) WITH PROPOFOL N/A  01/20/2020   Procedure: ESOPHAGOGASTRODUODENOSCOPY (EGD) WITH PROPOFOL;  Surgeon: Rush Landmark Telford Nab., MD;  Location: [place];  Service: Gastroenterology;  Laterality: N/A;   EUS N/A 01/20/2020   Procedure: UPPER ENDOSCOPIC ULTRASOUND (EUS) RADIAL;  Surgeon: Irving Copas., MD;  Location: [place];  Service: Gastroenterology;  Laterality: N/A;   EUS N/A 07/23/2020   Procedure: UPPER ENDOSCOPIC ULTRASOUND (EUS) RADIAL;  Surgeon: Irving Copas., MD;  Location: [place];  Service: Gastroenterology;  Laterality: N/A;   FIDUCIAL MARKER PLACEMENT N/A 07/23/2020   Procedure: FIDUCIAL MARKER PLACEMENT;  Surgeon: Irving Copas., MD;  Location: [place];  Service: Gastroenterology;  Laterality: N/A;   FINE NEEDLE ASPIRATION  11/20/2019   Procedure: FINE NEEDLE ASPIRATION (FNA) LINEAR;  Surgeon: Irving Copas., MD;  Location: [place];  Service: Gastroenterology;;   FINE NEEDLE ASPIRATION BIOPSY  01/20/2020   Procedure: FINE NEEDLE ASPIRATION BIOPSY;  Surgeon: Irving Copas., MD;  Location: [place];  Service: Gastroenterology;;   HEMOSTASIS CLIP PLACEMENT  11/20/2019   Procedure: HEMOSTASIS CLIP PLACEMENT;  Surgeon: Irving Copas., MD;  Location: [place];  Service: Gastroenterology;;   NASAL SINUS SURGERY     POLYPECTOMY  11/20/2019   Procedure: POLYPECTOMY;  Surgeon: Irving Copas., MD;  Location: [place];  Service: Gastroenterology;;   PORTACATH PLACEMENT N/A 03/11/2020   Procedure: INSERTION PORT-A-CATH;  Surgeon: Dwan Bolt, MD;  Location: [employer];  Service: General;  Laterality: N/A;  LMA   SPHINCTEROTOMY  03/07/2020   Procedure: Joan Mayans;  Surgeon: Carol Ada, MD;  Location: [place];  Service: Endoscopy;;   SPHINCTEROTOMY  03/09/2020   Procedure: Joan Mayans;  Surgeon: Mansouraty, Telford Nab., MD;  Location: [employer];  Service: Gastroenterology;;   SUBMUCOSAL LIFTING INJECTION   11/20/2019   Procedure: SUBMUCOSAL LIFTING INJECTION;  Surgeon: Irving Copas., MD;  Location: [place];  Service: Gastroenterology;;   UPPER ESOPHAGEAL ENDOSCOPIC ULTRASOUND (EUS) N/A 11/20/2019   Procedure: UPPER ESOPHAGEAL ENDOSCOPIC ULTRASOUND (EUS);  Surgeon: Irving Copas., MD;  Location: [place];  Service: Gastroenterology;  Laterality: N/A;    Prior to Admission medications   Medication Sig Start Date End Date Taking? Authorizing Provider  Cholecalciferol (VITAMIN D3) 25 MCG (1000 UT) CAPS Take 1,000 Units by mouth daily.   Yes [provider]  Cyanocobalamin (VITAMIN B-12) 2500 MCG SUBL Place 2,500 mcg under the tongue daily.   Yes [provider]  diltiazem (TIAZAC) 120 MG 24 hr capsule Take 120 mg by mouth daily.   Yes [provider]  enoxaparin (LOVENOX) 40 MG/0.4ML injection Inject 0.4 mLs (40 mg total) into the skin every 12 (twelve) hours. 12/01/20  Yes Owens Shark, NP  Ensure Max Protein (ENSURE MAX PROTEIN) LIQD Take 330 mLs (11 oz total) by mouth 2 (two) times daily with a meal. Patient taking differently: Take 11 oz by mouth daily as needed (Supplement). 11/28/19  Yes Vann, Jessica U, DO  famotidine (PEPCID) 40 MG tablet Take 40 mg by mouth daily as needed for heartburn or indigestion.  Yes [provider]  fexofenadine (ALLEGRA) 180 MG tablet Take 180 mg by mouth in the morning.   Yes [provider]  guaiFENesin (MUCINEX) 600 MG 12 hr tablet Take 600 mg by mouth daily.   Yes [provider]  insulin aspart (NOVOLOG) 100 UNIT/ML FlexPen Inject 3 Units into the skin 3 (three) times daily with meals. Patient taking differently: Inject 0-3 Units into the skin 3 (three) times daily before meals. Sliding Scale 11/28/19  Yes Geradine Girt, DO  Insulin Glargine (BASAGLAR KWIKPEN) 100 UNIT/ML Inject 12 Units into the skin daily. Patient taking differently: Inject 10-12 Units into the skin at  bedtime. Sliding scale 11/28/19  Yes Vann, Jessica U, DO  KLOR-CON M20 20 MEQ tablet TAKE 1 TABLET BY MOUTH EVERY DAY 11/05/20  Yes Ladell Pier, MD  lidocaine-prilocaine (EMLA) cream Apply 1 application topically as needed. Apply over port 45 minutes prior to having port accessed for chemotherapy treatment. 08/17/20  Yes Ladell Pier, MD  lipase/protease/amylase (CREON) 36000 UNITS CPEP capsule Take 1 capsule (36,000 Units total) by mouth 3 (three) times daily before meals. TAKE 1 CAPSULE (36,000 UNITS TOTAL) BY MOUTH 3 (THREE) TIMES DAILY BEFORE MEALS. 09/14/20  Yes Owens Shark, NP  Multiple Vitamins-Minerals (OCUVITE EYE HEALTH FORMULA PO) Take 1 tablet by mouth daily.   Yes [provider]  Probiotic Product (PROBIOTIC DAILY) CAPS Take 1 capsule by mouth daily.   Yes [provider]  telmisartan (MICARDIS) 40 MG tablet Take 40 mg by mouth daily.   Yes [provider]  traMADol (ULTRAM) 50 MG tablet Take 1 tablet (50 mg total) by mouth every 6 (six) hours as needed for moderate pain. 08/17/20  Yes Ladell Pier, MD  triamcinolone (NASACORT) 55 MCG/ACT AERO nasal inhaler Place 2 sprays into the nose daily as needed (for congestion).   Yes [provider]  ACCU-CHEK GUIDE test strip  02/17/20   [provider]  dicyclomine (BENTYL) 20 MG tablet Take 1 tablet (20 mg total) by mouth every 6 (six) hours as needed for spasms. Patient not taking: No sig reported 05/29/20   Ladell Pier, MD  mirtazapine (REMERON SOL-TAB) 15 MG disintegrating tablet TAKE 1 TABLET BY MOUTH EVERYDAY AT BEDTIME Patient not taking: No sig reported 11/03/20   Ladell Pier, MD  prochlorperazine (COMPAZINE) 10 MG tablet Take 1 tablet (10 mg total) by mouth every 6 (six) hours as needed for nausea or vomiting. Patient not taking: Reported on 12/02/2020 02/03/20   Ladell Pier, MD    Current Facility-Administered Medications  Medication Dose Route Frequency Provider  Last Rate Last Admin   0.9 %  sodium chloride infusion   Intravenous Continuous Dwyane Dee, MD 75 mL/hr at 12/03/20 8756 New Bag at 12/03/20 4332   acetaminophen (TYLENOL) tablet 650 mg  650 mg Oral Q6H PRN Dwyane Dee, MD       Or   acetaminophen (TYLENOL) suppository 650 mg  650 mg Rectal Q6H PRN Dwyane Dee, MD       Chlorhexidine Gluconate Cloth 2 % PADS 6 each  6 each Topical Daily Dwyane Dee, MD   6 each at 12/02/20 2000   hydrALAZINE (APRESOLINE) tablet 25 mg  25 mg Oral Q4H PRN Dwyane Dee, MD       insulin aspart (novoLOG) injection 0-5 Units  0-5 Units Subcutaneous QHS Dwyane Dee, MD   3 Units at 12/02/20 2157   insulin aspart (novoLOG) injection 0-9 Units  0-9 Units Subcutaneous TID WC Dwyane Dee, MD   5 Units at 12/02/20 1841   labetalol (NORMODYNE) injection 10 mg  10 mg Intravenous Q4H PRN Dwyane Dee, MD       pantoprazole (PROTONIX) injection 40 mg  40 mg Intravenous BID Dwyane Dee, MD   40 mg at 12/02/20 2151   sodium chloride flush (NS) 0.9 % injection 10-40 mL  10-40 mL Intracatheter Q12H Dwyane Dee, MD   10 mL at 12/02/20 2158   sodium chloride flush (NS) 0.9 % injection 10-40 mL  10-40 mL Intracatheter PRN Ladell Pier, MD       sodium chloride flush (NS) 0.9 % injection 3 mL  3 mL Intravenous Eddie Candle, MD   3 mL at 12/02/20 2158    Allergies as of 12/02/2020 - Review Complete 12/02/2020  Allergen Reaction Noted   Cephalosporins Shortness Of Breath 11/17/2019   Erythromycin Shortness Of Breath 11/17/2019   Fire ant Shortness Of Breath 01/10/2020   Penicillins Anaphylaxis and Other (See Comments) 11/17/2019    Family History  Family history unknown: Yes    Social History   Socioeconomic History   Marital status: Married    Spouse name: Not on file   Number of children: Not on file   Years of education: Not on file   Highest education level: Not on file  Occupational History   Not on file  Tobacco Use    Smoking status: Never   Smokeless tobacco: Never  Vaping Use   Vaping Use: Never used  Substance and Sexual Activity   Alcohol use: Yes    Comment: occasional wine   Drug use: Never   Sexual activity: Not on file    Comment: Hysterectomy  Other Topics Concern   Not on file  Social History Narrative   Not on file   Social Determinants of Health   Financial Resource Strain: Not on file  Food Insecurity: Not on file  Transportation Needs: Not on file  Physical Activity: Not on file  Stress: Not on file  Social Connections: Not on file  Intimate Partner Violence: Not At Risk   Fear of Current or Ex-Partner: No   Emotionally Abused: No   Physically Abused: No   Sexually Abused: No   Review of Systems: See HPI, all other systems reviewed and are negative  Physical Exam: Vital signs in last 24 hours: Temp:  [97.7 F (36.5 C)-98.6 F (37 C)] 98.2 F (36.8 C) (10/27 0141) Pulse Rate:  [68-79] 79 (10/27 0141) Resp:  [15-20] 16 (10/27 0141) BP: (103-126)/(45-80) 119/52 (10/27 0141) SpO2:  [98 %-100 %] 99 % (10/27 0141) Weight:  [53.3 kg] 53.3 kg (10/26 1835) Last BM Date: 12/02/20 General:  Alert 80 year old female fatigued appearing in no acute distress. Head:  Normocephalic and atraumatic. Eyes:  No scleral icterus. Conjunctiva pink. Ears:  Normal auditory acuity. Nose:  No deformity, discharge or lesions. Mouth:  Dentition intact. No ulcers or lesions.  Neck:  Supple. No lymphadenopathy or thyromegaly.  Lungs: Breath sounds clear throughout. Chest: Right subclavian Port-A-Cath intact. Heart: Regular rate and rhythm, no murmurs. Abdomen: Soft, mild central abdominal tenderness without rebound or guarding.  Nondistended.  Positive bowel sounds all 4 quadrants. Rectal: Deferred. Musculoskeletal:  Symmetrical without gross deformities.  Pulses:  Normal pulses noted. Extremities:  Without clubbing or edema. Neurologic:  Alert and  oriented x4. No focal deficits.  Skin:   Intact without significant lesions or rashes. Psych:  Alert and cooperative.  Normal mood and affect.  Intake/Output from previous day: 10/26 0701 - 10/27 0700 In: 381.2 [I.V.:381.2] Out: -  Intake/Output this shift: No intake/output data recorded.  Lab Results: Recent Labs    12/01/20 1258 12/02/20 1421 12/02/20 2116 12/03/20 0355  WBC 6.8 7.6  --  6.4  HGB 6.8* 9.2* 8.9* 8.8*  HCT 21.0* 27.9* 27.0* 26.7*  PLT 142* 154  --  136*   BMET Recent Labs    12/02/20 1421 12/03/20 0355  NA 135 138  K 3.6 3.3*  CL 105 111  CO2 20* 21*  GLUCOSE 168* 115*  BUN 15 13  CREATININE 0.38* 0.45  CALCIUM 7.9* 7.8*   LFT Recent Labs    12/03/20 0355  PROT 5.5*  ALBUMIN 2.2*  AST 26  ALT 23  ALKPHOS 277*  BILITOT 1.4*   PT/INR No results for input(s): LABPROT, INR in the last 72 hours. Hepatitis Panel No results for input(s): HEPBSAG, HCVAB, HEPAIGM, HEPBIGM in the last 72 hours.    Studies/Results: VAS [place] LOWER EXTREMITY VENOUS (DVT)  Result Date: 12/02/2020  Lower Venous DVT Study Patient Name:  TIMIYAH ROMITO  Date of Exam:   12/01/2020 Medical Rec #: [id number]    Accession #:    [id number] Date of Birth: [birth year]     Patient Gender: F Patient Age:   28 years Exam Location:  [facility] Procedure:      VAS [place] LOWER EXTREMITY VENOUS (DVT) Referring Phys: Ned Card --------------------------------------------------------------------------------  Indications: Bilateral leg swelling and SOB x 3 weeks. Patient takes aspirin daily.  Risk Factors: Cancer Pancreatic. Comparison Study: None Performing Technologist: Alecia Mackin RVT, RDCS (AE), RDMS  Examination Guidelines: A complete evaluation includes B-mode imaging, spectral Doppler, color Doppler, and power Doppler as needed of all accessible portions of each vessel. Bilateral testing is considered an integral part of a complete examination. Limited examinations for reoccurring indications may be performed as noted. The reflux  portion of the exam is performed with the patient in reverse Trendelenburg.  +---------+---------------+---------+-----------+---------------+--------------+ RIGHT    CompressibilityPhasicitySpontaneityProperties     Thrombus Aging +---------+---------------+---------+-----------+---------------+--------------+ CFV      Full           Yes      Yes                                      +---------+---------------+---------+-----------+---------------+--------------+ SFJ      Full           Yes      Yes                                      +---------+---------------+---------+-----------+---------------+--------------+ FV Prox  Full           Yes      Yes                                      +---------+---------------+---------+-----------+---------------+--------------+ FV Mid   Full           Yes      Yes                                      +---------+---------------+---------+-----------+---------------+--------------+  FV DistalPartial        Yes      Yes        seen in        Age                                                        duplicating    Indeterminate                                              vein only                     +---------+---------------+---------+-----------+---------------+--------------+ PFV      Full                                                             +---------+---------------+---------+-----------+---------------+--------------+ POP      Partial        Yes      No         with striationsAge                                                                       Indeterminate  +---------+---------------+---------+-----------+---------------+--------------+ PTV      None           No       No         rigid          Age                                                        w/compression  Indeterminate  +---------+---------------+---------+-----------+---------------+--------------+ PERO      None           No       No         rigid          Age                                                        w/compression  Indeterminate  +---------+---------------+---------+-----------+---------------+--------------+ Gastroc  Full                                                             +---------+---------------+---------+-----------+---------------+--------------+  GSV      Full           Yes      Yes                                      +---------+---------------+---------+-----------+---------------+--------------+   +---------+---------------+---------+-----------+---------------+--------------+ LEFT     CompressibilityPhasicitySpontaneityProperties     Thrombus Aging +---------+---------------+---------+-----------+---------------+--------------+ CFV      Full           Yes      Yes                                      +---------+---------------+---------+-----------+---------------+--------------+ SFJ      Full           Yes      Yes                                      +---------+---------------+---------+-----------+---------------+--------------+ FV Prox  Full           Yes      Yes                                      +---------+---------------+---------+-----------+---------------+--------------+ FV Mid   Partial        Yes      No         with striationsAge                                                                       Indeterminate  +---------+---------------+---------+-----------+---------------+--------------+ FV DistalPartial        Yes      No         with striationsAge                                                                       Indeterminate  +---------+---------------+---------+-----------+---------------+--------------+ PFV      Full                                                             +---------+---------------+---------+-----------+---------------+--------------+ POP       Partial        Yes      No         with striationsAge  Indeterminate  +---------+---------------+---------+-----------+---------------+--------------+ PTV      None           No       No         rigid          Age                                                        w/compression  Indeterminate  +---------+---------------+---------+-----------+---------------+--------------+ PERO     None           No       No         rigid          Age                                                        w/compression  Indeterminate  +---------+---------------+---------+-----------+---------------+--------------+ Gastroc  Full                                                             +---------+---------------+---------+-----------+---------------+--------------+ GSV      Full           Yes      Yes                                      +---------+---------------+---------+-----------+---------------+--------------+    Findings reported to [facility] in [place] 4:05 pm.  Summary: RIGHT: - Findings consistent with age indeterminate deep vein thrombosis involving the right femoral vein, right popliteal vein, right peroneal veins, and right posterior tibial veins.  LEFT: - Findings consistent with age indeterminate deep vein thrombosis involving the left femoral vein, left popliteal vein, left posterior tibial veins, and left peroneal veins.  *See table(s) above for measurements and observations. Electronically signed by Kathlyn Sacramento MD on 12/02/2020 at 9:28:36 AM.    Final     IMPRESSION/PLAN:  52) 80 year old female with pancreatic adenocarcinoma admitted to the hospital with anemia and GI bleeding, maroon colored stools after initiating Lovenox for bilateral lower extremity DVT. Hg 6.8 on 10/25 (baseline Hg 9.3) ->Transfused 1 unit of PRBCs at the cancer center 10/26 -> Hg 9.2 -> 8.9 -> 8.8.   She reported passing at least 5 small maroon bloody stools overnight.  New onset central abdominal pain which radiates throughout the abdomen but is not severe.  She is afebrile.  She remains hemodynamically stable. -NPO -IV fluids 75cc/hr -Monitor H&H closely -Continue to monitor patient closely for active GI bleeding -Transfuse for Hg < 7  -Continue PPI IV twice daily -Stat abdominal/pelvic CT scan with oral and IV contrast -EGD and colonoscopy (likely tomorrow) secondary to maroon colored stools, rule out upper GI etiology with history of reflux/gastritis/hematin vs lower GI etiology with history of colonic AVMs. EGD and colonoscopy benefits and risks discussed including risk with sedation, risk of bleeding, perforation and infection  -  Await further recommendations per Dr. 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

## 2020-12-03 NOTE — Progress Notes (Signed)
Progress Note    Nancy Hawkins   AQT:[id number]  DOB: [birth year]  DOA: 12/02/2020     1 Date of Service: 12/03/2020   Clinical Course Nancy Hawkins is an 80 yo female with PMH pancreatic adenocarcinoma diagnosed after EUS 01/20/2020, DM II, HTN, oxaliplatin neuropathy, isolated A. fib ~15 years ago.  She was last seen by oncology, Dr. Benay Hawkins on 10/20/2020 after having completed SBRT approximately 2 months prior. She then returned on 10/25 for follow-up and was found to have Hgb 6.8 g/dL and was arranged to have blood transfusion (which would take place on 10/26).  She also underwent work-up for lower extremity edema noted on her 10/25 visit with a LE duplex.  This was performed on 12/01/2020 and revealed extensive bilateral lower extremity DVT with age-indeterminate clots.  After risks and benefits were discussed, she decided to start Lovenox.  She had her first dose approximately 1 PM on 12/01/2020 and her second dose around 11 AM on 12/02/2020.  When she presented for blood transfusions on 12/02/2020, she noticed having maroon stools with her bowel movements and mild amount of blood when wiping. Due to this, she was referred for direct admission and GI evaluation given concern for possible involvement from her GI tract related to underlying pancreatic cancer.   Assessment and Plan * GIB (gastrointestinal bleeding) - differential includes increased risk of spontaneous bleeding with addition of Lovenox started 10/25 vs related to underlying pancreatic cancer. Patient also noted to have a bilary stent in place as well - Lovenox started 10/25: 1st dose around 1 pm and 2nd dose 10/26 ~ 11am - continue trending H/H - s/p 2 units PRBC on 10/26; repeat Hgb is 9.2 g/dL (up from 6.8 g/dL) - GI consulted, appreciate assistance - CLD for now, NPO at [facility] in case of procedures tomorrow - follow up CT A/P  - hold BP meds; PRNs ordered - continue IVF  DVT of lower extremity, bilateral (Nancy Hawkins) - Underwent  work-up for asymmetric lower extremity edema on 12/01/2020 - Duplex shows:     Right: age indeterminate deep vein thrombosis involving the right femoral vein, right popliteal vein, right peroneal veins, and right posterior tibial veins.    Left: age indeterminate deep vein thrombosis involving the left femoral vein, left popliteal vein, left posterior tibial veins, and left peroneal veins. - Lovenox started 10/25; only had 2 doses prior to admission - holding lovenox in setting of GIB workup  - further decision regarding IVC filter to be determined pending GIB workup   Primary cancer of head of pancreas [facility]) - diagnosed after EUS Dec 2021 - s/p SBRT - follows with Dr. Benay Hawkins; patient direct admit from cancer center - Alk phos and TB improved some today compared to prior - follow up CMP in am - oncology also following; CA 19-9 significantly elevated from prior value; follow up repeat CT A/P ordered 10/27  History of atrial fibrillation - Distant and remote history per patient, approximately 15 years ago - Not on anticoagulation - On Cardizem at home as part of blood pressure regimen  HTN (hypertension) - BP meds on hold in setting of GI bleed - Use labetalol or hydralazine as needed  DMII (diabetes mellitus, type 2) ([place]) - last A1c 7.2 % on 03/05/20 - repeat A1c is 6.9% this admission  - SSI and CBG monitoring    Subjective:  No events overnight. Appeared to be in some abd discomfort this am, which she was not having yesterday when seen. Still  had more maroon stools overnight she reported as well.   Objective Vitals:   12/02/20 1805 12/02/20 1835 12/02/20 2149 12/03/20 0141  BP: 103/80  (!) 124/58 (!) 119/52  Pulse: 76  70 79  Resp: '18  15 16  ' Temp: 97.7 F (36.5 C)  98.3 F (36.8 C) 98.2 F (36.8 C)  TempSrc: Oral  Oral Oral  SpO2: 100%  98% 99%  Weight:  53.3 kg    Height:  '5\' 3"'  (1.6 m)     53.3 kg  Vital signs were reviewed and  unremarkable.   Exam Physical Exam Constitutional:      Comments: Slightly uncomfortable appearing; no overt distress  HENT:     Head: Normocephalic and atraumatic.     Mouth/Throat:     Mouth: Mucous membranes are moist.  Eyes:     Extraocular Movements: Extraocular movements intact.  Cardiovascular:     Rate and Rhythm: Normal rate and regular rhythm.     Heart sounds: Normal heart sounds.  Pulmonary:     Effort: Pulmonary effort is normal.     Breath sounds: Normal breath sounds.  Abdominal:     General: Bowel sounds are normal. There is no distension.     Palpations: Abdomen is soft.  Musculoskeletal:        General: Normal range of motion.     Cervical back: Normal range of motion and neck supple.  Skin:    General: Skin is warm and dry.  Neurological:     General: No focal deficit present.  Psychiatric:        Mood and Affect: Mood normal.        Behavior: Behavior normal.     Labs / Other Information My review of labs, imaging, notes and other tests is significant for Hgb 8.8 g/dL this am     Disposition Plan: Status is: Inpatient  Remains inpatient appropriate because: treatment of above    Time spent: Greater than 50% of the 35 minute visit was spent in counseling/coordination of care for the patient as laid out in the A&P.  Dwyane Dee, MD [facility] 12/03/2020, 3:39 PM

## 2020-12-03 NOTE — Progress Notes (Addendum)
HEMATOLOGY-ONCOLOGY PROGRESS NOTE  SUBJECTIVE: Nancy Hawkins is followed by our office for pancreatic cancer.  She is not currently on chemotherapy and completed SBRT in July 2022.  Now admitted for GI bleed and has recent diagnosis of bilateral lower extremity DVTs.  Her anticoagulation is currently on hold secondary to GI bleed.  She has been seen by GI.  They are planning for EGD.  This morning, the patient reports that she has ongoing maroon-colored stools.  She remains weak.  She reports mild abdominal discomfort.  She has no nausea or vomiting.  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[place])   01/29/2020 Cancer Staging   Staging form: Exocrine Pancreas, AJCC 8th Edition - Clinical: Stage IB (cT2, cN0, cM0) - Signed by Ladell Pier, MD on 01/29/2020    03/23/2020 -  Chemotherapy    Patient is on Treatment Plan: PANCREAS MODIFIED FOLFIRINOX Q14D X 4 CYCLES        PHYSICAL EXAMINATION:  Vitals:   12/02/20 2149 12/03/20 0141  BP: (!) 124/58 (!) 119/52  Pulse: 70 79  Resp: 15 16  Temp: 98.3 F (36.8 C) 98.2 F (36.8 C)  SpO2: 98% 99%   Filed Weights   12/02/20 1835  Weight: 53.3 kg    Intake/Output from previous day: 10/26 0701 - 10/27 0700 In: 381.2 [I.V.:381.2] Out: -   GENERAL: Chronically ill-appearing female, no distress SKIN: skin color, texture, turgor are normal, no rashes or significant lesions EYES: normal, Conjunctiva are pink and non-injected, sclera clear OROPHARYNX:no exudate, no erythema and lips, buccal mucosa, and tongue normal  LUNGS: clear to auscultation and percussion with normal breathing effort HEART: regular rate & rhythm and no murmurs.  Trace edema in the bilateral lower extremities ABDOMEN:abdomen soft, non-tender and normal bowel sounds NEURO: alert & oriented x 3 with fluent speech, no focal motor/sensory deficits  LABORATORY DATA:  I have reviewed  the data as listed CMP Latest Ref Rng & Units 12/03/2020 12/02/2020 10/20/2020  Glucose 70 - 99 mg/dL 115(H) 168(H) 214(H)  BUN 8 - 23 mg/dL 13 15 23   Creatinine 0.44 - 1.00 mg/dL 0.45 0.38(L) 0.44  Sodium 135 - 145 mmol/L 138 135 139  Potassium 3.5 - 5.1 mmol/L 3.3(L) 3.6 4.1  Chloride 98 - 111 mmol/L 111 105 108  CO2 22 - 32 mmol/L 21(L) 20(L) 23  Calcium 8.9 - 10.3 mg/dL 7.8(L) 7.9(L) 8.8(L)  Total Protein 6.5 - 8.1 g/dL 5.5(L) 5.8(L) 6.7  Total Bilirubin 0.3 - 1.2 mg/dL 1.4(H) 2.1(H) 0.5  Alkaline Phos 38 - 126 U/L 277(H) 317(H) 138(H)  AST 15 - 41 U/L 26 26 15   ALT 0 - 44 U/L 23 22 18     Lab Results  Component Value Date   WBC 6.4 12/03/2020   HGB 8.8 (L) 12/03/2020   HCT 26.7 (L) 12/03/2020   MCV 87.0 12/03/2020   PLT 136 (L) 12/03/2020   NEUTROABS 4.8 12/03/2020    VAS [place] LOWER EXTREMITY VENOUS (DVT)  Result Date: 12/02/2020  Lower Venous DVT Study Patient Name:  Nancy Hawkins  Date of Exam:   12/01/2020 Medical Rec #: [id number]    Accession #:    [id number] Date of Birth: 03-06-1940     Patient Gender: F Patient Age:   80 years Exam Location:  [facility] Procedure:      VAS [place] LOWER EXTREMITY VENOUS (DVT) Referring Phys: Ned Card --------------------------------------------------------------------------------  Indications:  Bilateral leg swelling and SOB x 3 weeks. Patient takes aspirin daily.  Risk Factors: Cancer Pancreatic. Comparison Study: None Performing Technologist: Alecia Mackin RVT, RDCS (AE), RDMS  Examination Guidelines: A complete evaluation includes B-mode imaging, spectral Doppler, color Doppler, and power Doppler as needed of all accessible portions of each vessel. Bilateral testing is considered an integral part of a complete examination. Limited examinations for reoccurring indications may be performed as noted. The reflux portion of the exam is performed with the patient in reverse Trendelenburg.   +---------+---------------+---------+-----------+---------------+--------------+ RIGHT    CompressibilityPhasicitySpontaneityProperties     Thrombus Aging +---------+---------------+---------+-----------+---------------+--------------+ CFV      Full           Yes      Yes                                      +---------+---------------+---------+-----------+---------------+--------------+ SFJ      Full           Yes      Yes                                      +---------+---------------+---------+-----------+---------------+--------------+ FV Prox  Full           Yes      Yes                                      +---------+---------------+---------+-----------+---------------+--------------+ FV Mid   Full           Yes      Yes                                      +---------+---------------+---------+-----------+---------------+--------------+ FV DistalPartial        Yes      Yes        seen in        Age                                                        duplicating    Indeterminate                                              vein only                     +---------+---------------+---------+-----------+---------------+--------------+ PFV      Full                                                             +---------+---------------+---------+-----------+---------------+--------------+ POP      Partial        Yes      No  with striationsAge                                                                       Indeterminate  +---------+---------------+---------+-----------+---------------+--------------+ PTV      None           No       No         rigid          Age                                                        w/compression  Indeterminate  +---------+---------------+---------+-----------+---------------+--------------+ PERO     None           No       No         rigid          Age                                                         w/compression  Indeterminate  +---------+---------------+---------+-----------+---------------+--------------+ Gastroc  Full                                                             +---------+---------------+---------+-----------+---------------+--------------+ GSV      Full           Yes      Yes                                      +---------+---------------+---------+-----------+---------------+--------------+   +---------+---------------+---------+-----------+---------------+--------------+ LEFT     CompressibilityPhasicitySpontaneityProperties     Thrombus Aging +---------+---------------+---------+-----------+---------------+--------------+ CFV      Full           Yes      Yes                                      +---------+---------------+---------+-----------+---------------+--------------+ SFJ      Full           Yes      Yes                                      +---------+---------------+---------+-----------+---------------+--------------+ FV Prox  Full           Yes      Yes                                      +---------+---------------+---------+-----------+---------------+--------------+  FV Mid   Partial        Yes      No         with striationsAge                                                                       Indeterminate  +---------+---------------+---------+-----------+---------------+--------------+ FV DistalPartial        Yes      No         with striationsAge                                                                       Indeterminate  +---------+---------------+---------+-----------+---------------+--------------+ PFV      Full                                                             +---------+---------------+---------+-----------+---------------+--------------+ POP      Partial        Yes      No         with striationsAge                                                                        Indeterminate  +---------+---------------+---------+-----------+---------------+--------------+ PTV      None           No       No         rigid          Age                                                        w/compression  Indeterminate  +---------+---------------+---------+-----------+---------------+--------------+ PERO     None           No       No         rigid          Age                                                        w/compression  Indeterminate  +---------+---------------+---------+-----------+---------------+--------------+ Gastroc  Full                                                             +---------+---------------+---------+-----------+---------------+--------------+  GSV      Full           Yes      Yes                                      +---------+---------------+---------+-----------+---------------+--------------+    Findings reported to [facility] in [facility] 4:05 pm.  Summary: RIGHT: - Findings consistent with age indeterminate deep vein thrombosis involving the right femoral vein, right popliteal vein, right peroneal veins, and right posterior tibial veins.  LEFT: - Findings consistent with age indeterminate deep vein thrombosis involving the left femoral vein, left popliteal vein, left posterior tibial veins, and left peroneal veins.  *See table(s) above for measurements and observations. Electronically signed by Kathlyn Sacramento MD on 12/02/2020 at 9:28:36 AM.    Final     ASSESSMENT AND PLAN: Pancreas cancer, borderline resectable CT abdomen/pelvis-inflammation involving the mid body of the pancreas with peripancreatic fat stranding; possible 2.5 x 2.2 cm low-attenuation mass within the mid body of the pancreas, appears to abut the celiac axis as well as the splenic vein.   MRI 11/18/2019-extensive inflammatory fat stranding about the pancreas with a thinly septated  multicystic fluid signal lesion of the anterior pancreatic neck measuring 3.2 x 1.7 cm most consistent with a pancreatic pseudocyst; numerous additional small fluid signal lesions throughout the pancreatic parenchyma, the remaining lesions subcentimeter; central portion of pancreatic duct dilated measuring up to 8 mm without obvious obstructing lesion identified; trace ascites; trace bilateral pleural effusions and associated atelectasis or consolidation.   Colonoscopy 11/20/2019-5 polyps (resected and retrieved- multiple tubular adenomas, negative for high-grade dysplasia); 2 colonic angioectasias; diverticulosis in the entire examined colon; nonbleeding nonthrombosed external and internal hemorrhoids.   EGD/EUS 11/20/2019-few white nummular lesions in the entire esophagus (esophagus biopsy with no significant pathologic findings, no fungal elements); hematin found in the entire examined stomach; gastritis (stomach biopsy showed gastropathy/gastritis); 14 mm semisessile polyp found in the posterior wall of the gastric antrum (hyperplastic polyp); a lesion was identified in the pancreatic body staged T2 N0 MX.  FNA of the pancreas lesion showed benign reactive/reparative changes, prominent inflammation.   EUS procedure 01/20/2020-masslike region identified in the pancreatic head, stage T2 N0 MX.  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 CTs chest/pelvis 01/29/2020-no evidence of metastatic disease CT abdomen/pelvis 03/05/2020-increase in size of pancreas head/uncinate mass, marked narrowing of the adjacent portal vein, patchy consolidation in the right lower lung, mild intrahepatic and extrahepatic biliary ductal dilatation Cycle 1 FOLFOX 03/23/2020 Cycle 2 FOLFOX 04/06/2020, Emend added for delayed nausea after cycle 1 Cycle 3 FOLFIRINOX 04/20/2020 Cycle 4 FOLFIRINOX 05/04/2020, Udenyca Cycle 5 FOLFIRINOX 05/18/2020, Udenyca Cycle 6 FOLFIRINOX 06/02/2020,  Udenyca CTs 06/15/2020- decreased size of pancreatic mass, still with portal vein involvement and extensive upper abdominal collaterals. 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  The upper third of the main bile duct and left and right hepatic ducts and all intrahepatic branches moderately dilated secondary to the stricture.  Biliary sphincterotomy performed.  Covered metal biliary stent placed into the common bile duct.  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  GI will be seeing patient and is planning for EGD soon.  Will await their recommendations.  She was recently started on anticoagulation due to new diagnosis of bilateral lower extremity DVTs.  Will need to hold anticoagulation while awaiting GI work-up.  It is possible that her tumor has eroded into her GI tract causing the bleed.  If GI bleed is tumor related, will need to consider her for an IVC filter.  Recommend monitoring hemoglobin closely and transfuse if hemoglobin is below 8 with active bleeding.  The patient's CA 19.9 has risen significantly in about 1 month.  A repeat CT of the abdomen/pelvis has been ordered by GI and we will follow-up on this.  We will have further discussions regarding any additional treatment options for pancreatic cancer pending above work-up.  Recommendations: 1.  Await work-up from GI. 2.  Will to consider IVC  filter placement depending on the GI evaluation for a source of bleeding 3.  Monitor CBC and transfuse for hemoglobin less than 8 with active bleeding. 4.  We will follow-up on the CT scans ordered by GI. 5.  We will determine additional treatment options for her pancreatic cancer once her acute issues resolve.  Future Appointments  Date Time Provider [place]  12/10/2020  8:15 AM [facility] PHLEBOTOMIST [facility] None  12/10/2020  8:45 AM [facility] FLUSH ROOM [facility] None  12/10/2020  9:15 AM Owens Shark, NP [facility] None      LOS: 1 day   Mikey Bussing, DNP, AGPCNP-BC, AOCNP 12/03/20 Nancy Hawkins was interviewed and examined.  She continues to have dark stool.  The hemoglobin is stable this morning.  She has increased abdominal pain and the CA 19-9 is higher.  I agree with the plan for a restaging abdomen CT.  She was diagnosed with bilateral lower extremity DVTs on 12/01/2020.  Anticoagulation is on hold while she undergoes a GI evaluation.  We will need to consider IVC filter placement depending on the source of GI bleeding.  I will discuss treatment options for the pancreas cancer based on the CT result.  I was present for greater than 50% of today's visit.  I performed medical decision making.

## 2020-12-04 ENCOUNTER — Inpatient Hospital Stay (HOSPITAL_COMMUNITY): Payer: Medicare Other | Admitting: Anesthesiology

## 2020-12-04 ENCOUNTER — Encounter (HOSPITAL_COMMUNITY)
Admission: AD | Disposition: A | Discharge status: Home / Self Care | Payer: Self-pay | Source: Ambulatory Visit | Attending: Internal Medicine

## 2020-12-04 ENCOUNTER — Encounter (HOSPITAL_COMMUNITY): Payer: Self-pay | Admitting: Internal Medicine

## 2020-12-04 DIAGNOSIS — K625 Hemorrhage of anus and rectum: Secondary | ICD-10-CM

## 2020-12-04 DIAGNOSIS — I82493 Acute embolism and thrombosis of other specified deep vein of lower extremity, bilateral: Secondary | ICD-10-CM

## 2020-12-04 DIAGNOSIS — I8510 Secondary esophageal varices without bleeding: Secondary | ICD-10-CM | POA: Diagnosis not present

## 2020-12-04 DIAGNOSIS — K2970 Gastritis, unspecified, without bleeding: Secondary | ICD-10-CM

## 2020-12-04 DIAGNOSIS — K5520 Angiodysplasia of colon without hemorrhage: Secondary | ICD-10-CM | POA: Diagnosis not present

## 2020-12-04 DIAGNOSIS — C250 Malignant neoplasm of head of pancreas: Secondary | ICD-10-CM | POA: Diagnosis not present

## 2020-12-04 DIAGNOSIS — K922 Gastrointestinal hemorrhage, unspecified: Secondary | ICD-10-CM | POA: Diagnosis not present

## 2020-12-04 HISTORY — PX: HOT HEMOSTASIS: SHX5433

## 2020-12-04 HISTORY — PX: HEMOSTASIS CLIP PLACEMENT: SHX6857

## 2020-12-04 HISTORY — PX: ESOPHAGOGASTRODUODENOSCOPY: SHX5428

## 2020-12-04 HISTORY — PX: BIOPSY: SHX5522

## 2020-12-04 HISTORY — PX: COLONOSCOPY WITH PROPOFOL: SHX5780

## 2020-12-04 LAB — CBC WITH DIFFERENTIAL/PLATELET
Abs Immature Granulocytes: 0.07 10*3/uL (ref 0.00–0.07)
Basophils Absolute: 0.1 10*3/uL (ref 0.0–0.1)
Basophils Relative: 0 %
Eosinophils Absolute: 0.1 10*3/uL (ref 0.0–0.5)
Eosinophils Relative: 1 %
HCT: 30.3 % — ABNORMAL LOW (ref 36.0–46.0)
Hemoglobin: 9.8 g/dL — ABNORMAL LOW (ref 12.0–15.0)
Immature Granulocytes: 1 %
Lymphocytes Relative: 6 %
Lymphs Abs: 0.7 10*3/uL (ref 0.7–4.0)
MCH: 28.7 pg (ref 26.0–34.0)
MCHC: 32.3 g/dL (ref 30.0–36.0)
MCV: 88.6 fL (ref 80.0–100.0)
Monocytes Absolute: 1.2 10*3/uL — ABNORMAL HIGH (ref 0.1–1.0)
Monocytes Relative: 10 %
Neutro Abs: 9.5 10*3/uL — ABNORMAL HIGH (ref 1.7–7.7)
Neutrophils Relative %: 82 %
Platelets: 191 10*3/uL (ref 150–400)
RBC: 3.42 MIL/uL — ABNORMAL LOW (ref 3.87–5.11)
RDW: 14.6 % (ref 11.5–15.5)
WBC: 11.6 10*3/uL — ABNORMAL HIGH (ref 4.0–10.5)
nRBC: 0 % (ref 0.0–0.2)

## 2020-12-04 LAB — COMPREHENSIVE METABOLIC PANEL
ALT: 26 U/L (ref 0–44)
AST: 34 U/L (ref 15–41)
Albumin: 2.3 g/dL — ABNORMAL LOW (ref 3.5–5.0)
Alkaline Phosphatase: 318 U/L — ABNORMAL HIGH (ref 38–126)
Anion gap: 8 (ref 5–15)
BUN: 12 mg/dL (ref 8–23)
CO2: 17 mmol/L — ABNORMAL LOW (ref 22–32)
Calcium: 7.8 mg/dL — ABNORMAL LOW (ref 8.9–10.3)
Chloride: 110 mmol/L (ref 98–111)
Creatinine, Ser: 0.49 mg/dL (ref 0.44–1.00)
GFR, Estimated: >60 mL/min (ref >60)
Glucose, Bld: 302 mg/dL — ABNORMAL HIGH (ref 70–99)
Potassium: 4 mmol/L (ref 3.5–5.1)
Sodium: 135 mmol/L (ref 135–145)
Total Bilirubin: 1.1 mg/dL (ref 0.3–1.2)
Total Protein: 5.9 g/dL — ABNORMAL LOW (ref 6.5–8.1)

## 2020-12-04 LAB — GLUCOSE, CAPILLARY
Glucose-Capillary: 167 mg/dL — ABNORMAL HIGH (ref 70–99)
Glucose-Capillary: 112 mg/dL — ABNORMAL HIGH (ref 70–99)
Glucose-Capillary: 169 mg/dL — ABNORMAL HIGH (ref 70–99)
Glucose-Capillary: 243 mg/dL — ABNORMAL HIGH (ref 70–99)

## 2020-12-04 LAB — MAGNESIUM: Magnesium: 1.8 mg/dL (ref 1.7–2.4)

## 2020-12-04 SURGERY — EGD (ESOPHAGOGASTRODUODENOSCOPY)
Anesthesia: Monitor Anesthesia Care

## 2020-12-04 MED ORDER — PHENYLEPHRINE HCL-NACL 20-0.9 MG/250ML-% IV SOLN
INTRAVENOUS | Status: DC | PRN
  Administered 2020-12-04: 25 ug/min via INTRAVENOUS

## 2020-12-04 MED ORDER — ONDANSETRON HCL 4 MG/2ML IJ SOLN
INTRAMUSCULAR | Status: DC | PRN
  Administered 2020-12-04: 4 mg via INTRAVENOUS

## 2020-12-04 MED ORDER — PROPOFOL 500 MG/50ML IV EMUL
INTRAVENOUS | Status: DC | PRN
  Administered 2020-12-04: 250 ug/kg/min via INTRAVENOUS

## 2020-12-04 MED ORDER — SUCRALFATE 1 GM/10ML PO SUSP
1.0000 g | Freq: Two times a day (BID) | ORAL | Status: DC
  Administered 2020-12-04 – 2020-12-09 (×11): 1 g via ORAL
  Filled 2020-12-04 (×11): qty 10

## 2020-12-04 MED ORDER — LIDOCAINE HCL (CARDIAC) PF 100 MG/5ML IV SOSY
PREFILLED_SYRINGE | INTRAVENOUS | Status: DC | PRN
  Administered 2020-12-04: 50 mg via INTRAVENOUS

## 2020-12-04 MED ORDER — LACTATED RINGERS IV SOLN: INTRAVENOUS | Status: DC | PRN

## 2020-12-04 MED ORDER — PANTOPRAZOLE SODIUM 40 MG PO TBEC
40.0000 mg | DELAYED_RELEASE_TABLET | Freq: Every day | ORAL | Status: DC
  Administered 2020-12-04 – 2020-12-09 (×6): 40 mg via ORAL
  Filled 2020-12-04 (×6): qty 1

## 2020-12-04 MED ORDER — BISACODYL 5 MG PO TBEC
10.0000 mg | DELAYED_RELEASE_TABLET | Freq: Once | ORAL | Status: DC
  Filled 2020-12-04: qty 2

## 2020-12-04 SURGICAL SUPPLY — 22 items

## 2020-12-04 NOTE — Progress Notes (Signed)
This shift pt received one packet of 2 movieprep. Pt NPO after midnight, was only able to tolerate one packet, started to feel abdominal pain.  Attending notified. Per pt stool becoming clear. Writer advised pt to call after  having next BM.

## 2020-12-04 NOTE — Transfer of Care (Signed)
Immediate Anesthesia Transfer of Care Note  Patient: EILEE SCHADER  Procedure(s) Performed: ESOPHAGOGASTRODUODENOSCOPY (EGD) COLONOSCOPY WITH PROPOFOL BIOPSY HOT HEMOSTASIS (ARGON PLASMA COAGULATION/BICAP) HEMOSTASIS CLIP PLACEMENT  Patient Location: PACU  Anesthesia Type:MAC  Level of Consciousness: awake, alert  and patient cooperative  Airway & Oxygen Therapy: Patient Spontanous Breathing and Patient connected to face mask oxygen  Post-op Assessment: Report given to RN, Post -op Vital signs reviewed and stable and Patient moving all extremities X 4  Post vital signs: stable  Last Vitals:  Vitals Value Taken Time  BP 110/38 12/04/20 1355  Temp    Pulse 64 12/04/20 1357  Resp 17 12/04/20 1357  SpO2 100 % 12/04/20 1357  Vitals shown include unvalidated device data.  Last Pain:  Vitals:   12/04/20 1355  TempSrc:   PainSc: 0-No pain         Complications: No notable events documented.

## 2020-12-04 NOTE — Progress Notes (Signed)
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 fib ~15 years ago.  She was last seen by oncology, Nancy Hawkins on 10/20/2020 after having completed SBRT approximately 2 months prior. She then returned on 10/25 for follow-up and was found to have Hgb 6.8 g/dL and was arranged to have blood transfusion (which would take place on 10/26).  She also underwent work-up for lower extremity edema noted on her 10/25 visit with a LE duplex.  This was performed on 12/01/2020 and revealed extensive bilateral lower extremity DVT with age-indeterminate clots.  After risks and benefits were discussed, she decided to start Lovenox.  She had her first dose approximately 1 PM on 12/01/2020 and her second dose around 11 AM on 12/02/2020.  When she presented for blood transfusions on 12/02/2020, she noticed having maroon stools with her bowel movements and mild amount of blood when wiping. Due to this, she was referred for direct admission and GI evaluation given concern for possible involvement from her GI tract related to underlying pancreatic cancer.   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    Left: age indeterminate deep vein thrombosis involving the left femoral vein, left popliteal vein, left posterior tibial veins, and left peroneal veins. - Lovenox started 10/25; only had 2 doses prior to admission - discussed with GI and oncology after EGD/CLN on 10/28: plan is to start heparin (no bolus) on Monday (if tolerates anticoagulation then possibly Lovenox again); if does have re-bleeding then IVC to be further considered)  Primary cancer of head of pancreas [facility]) - diagnosed after EUS Dec 2021 - s/p SBRT - follows with Nancy Hawkins - CT A/P on 10/27 shows new numerous hepatic lesions compatible with metastasis  -Evaluated by oncology during hospitalization as well.  Next steps will either be trial of second line chemo versus transitioning to hospice -Further plans to likely be determined after discharge  History of atrial fibrillation - Distant and remote history per patient, approximately 15 years ago - Not on anticoagulation - On Cardizem at home as part of blood pressure regimen  HTN (hypertension) - BP meds on hold in setting of GI bleed - Use labetalol or hydralazine as needed  DMII (diabetes mellitus, type 2) (HCC) - last A1c 7.2 % on 03/05/20 - repeat A1c is 6.9% this admission  - SSI and CBG monitoring      Subjective:  Seen this morning prior to endoscopies.  Stools were yellow and clear when seen in the toilet this morning.  She still felt poorly with ongoing abdominal discomfort.  Objective Vitals:   12/04/20 1235 12/04/20 1355 12/04/20 1405  12/04/20 1415  BP: (!) 138/52 (!) 110/38 (!) 104/40 (!) 115/48  Pulse: 71 65 65 64  Resp: 12 20 16 16   Temp: 98.5 F (36.9 C) 97.6 F (36.4 C)    TempSrc: Axillary Axillary    SpO2: 100% 100% 98% 100%  Weight:      Height:       53.3 kg  Vital signs were reviewed and unremarkable.   Exam Physical Exam Constitutional:      Appearance: Normal appearance.   HENT:     Head: Normocephalic and atraumatic.     Mouth/Throat:     Mouth: Mucous membranes are moist.  Eyes:     Extraocular Movements: Extraocular movements intact.  Cardiovascular:     Rate and Rhythm: Normal rate and regular rhythm.  Pulmonary:     Effort: Pulmonary effort is normal.     Breath sounds: Normal breath sounds.  Abdominal:     General: Bowel sounds are normal. There is no distension.     Palpations: Abdomen is soft.     Tenderness: There is abdominal tenderness.  Musculoskeletal:        General: Normal range of motion.     Cervical back: Normal range of motion and neck supple.  Skin:    General: Skin is warm and dry.  Neurological:     General: No focal deficit present.     Mental Status: She is alert.  Psychiatric:        Mood and Affect: Mood normal.        Behavior: Behavior normal.     Labs / Other Information My review of labs, imaging, notes and other tests shows no new significant findings.    Disposition Plan: Status is: Inpatient  Remains inpatient appropriate because: Treatment of above    Time spent: Greater than 50% of the 35 minute visit was spent in counseling/coordination of care for the patient as laid out in the A&P.  Dwyane Dee, MD [facility] 12/04/2020, 5:30 PM

## 2020-12-04 NOTE — H&P (View-Only) (Signed)
[facility] Progress Note  CC:  GI bleed, maroon stools with anemia   Subjective: She drank one dose of Movi prep yesterday evening, she was unable to tolerate the 2nd dose due to abdominal cramping. No N/V.  She is passing clear yellow color water per the rectum as verified by Edwin Cap. She denies having any abdominal pain at this time. No CP or SOB. No family at the bedside. She wishes to proceed with an EGD and colonoscopy later today as scheduled.   Objective:   CTAP 12/03/2020: Numerous rim enhancing lesions throughout the liver measuring up to 4 cm compatible with metastases. These are new since prior study.  Hepatic steatosis.  Cystic appearance of the mass in the pancreatic head which could be related to necrosis.  Biliary stent in place with pneumobilia.  Small amount of free fluid in the abdomen or pelvis.  Left colonic diverticulosis.  No active diverticulitis.   Vital signs in last 24 hours: Temp:  [97.7 F (36.5 C)-98.3 F (36.8 C)] 98.2 F (36.8 C) (10/28 0453) Pulse Rate:  [69-78] 69 (10/28 0453) Resp:  [17-18] 18 (10/28 0453) BP: (114-131)/(57-64) 131/57 (10/28 0453) SpO2:  [99 %-100 %] 100 % (10/28 0453) Last BM Date: 12/03/20 General:   Alert fatigued appearing female in NAD. Heart:  RRR, no murmur.  Pulm:  Breath sounds clear throughout.  Abdomen: Soft, nondistended. Nontender. + BS x 4 quads.  Extremities:  Without edema. Neurologic:  Alert and  oriented x4;  grossly normal neurologically. Psych:  Alert and cooperative. Normal mood and affect.  Intake/Output from previous day: 10/27 0701 - 10/28 0700 In: 3 [I.V.:3] Out: -  Intake/Output this shift: No intake/output data recorded.  Lab Results: Recent Labs    12/02/20 1421 12/02/20 2116 12/03/20 0355 12/03/20 1625 12/04/20 0031  WBC 7.6  --  6.4  --  11.6*  HGB 9.2*   < > 8.8* 9.3* 9.8*  HCT 27.9*   < > 26.7* 28.4* 30.3*  PLT 154  --  136*  --  191   < > = values in this  interval not displayed.   BMET Recent Labs    12/02/20 1421 12/03/20 0355 12/04/20 0031  NA 135 138 135  K 3.6 3.3* 4.0  CL 105 111 110  CO2 20* 21* 17*  GLUCOSE 168* 115* 302*  BUN 15 13 12   CREATININE 0.38* 0.45 0.49  CALCIUM 7.9* 7.8* 7.8*   LFT Recent Labs    12/04/20 0031  PROT 5.9*  ALBUMIN 2.3*  AST 34  ALT 26  ALKPHOS 318*  BILITOT 1.1   PT/INR No results for input(s): LABPROT, INR in the last 72 hours. Hepatitis Panel No results for input(s): HEPBSAG, HCVAB, HEPAIGM, HEPBIGM in the last 72 hours.  CT ABDOMEN PELVIS W CONTRAST  Result Date: 12/03/2020 CLINICAL DATA:  Anemia, central abdominal pain. Known pancreatic cancer. EXAM: CT ABDOMEN AND PELVIS WITH CONTRAST TECHNIQUE: Multidetector CT imaging of the abdomen and pelvis was performed using the standard protocol following bolus administration of intravenous contrast. CONTRAST:  70mL OMNIPAQUE IOHEXOL 350 MG/ML SOLN COMPARISON:  06/15/2020 FINDINGS: Lower chest: Lung bases are clear. No effusions. Heart is normal size. Hepatobiliary: Numerous low-density masses with rim enhancement noted throughout the liver compatible with metastases. The largest is in the right hepatic dome measuring 3.9 cm. Biliary stent noted in the common bile duct with pneumobilia. Diffuse low-density throughout the liver compatible with fatty infiltration. Prior cholecystectomy. Pancreas: In the area  of the prior mass in the pancreatic head is a low-density cystic area measuring 1.7 cm. This may reflect necrosis of the previously cyst in pancreatic head mass. Mild biliary ductal dilatation. Spleen: No focal abnormality.  Normal size. Adrenals/Urinary Tract: No hydronephrosis. No renal or adrenal mass. Urinary bladder unremarkable. Stomach/Bowel: Left colonic diverticulosis. No active diverticulitis. Stomach and small bowel decompressed, unremarkable. Vascular/Lymphatic: Aortic atherosclerosis. No evidence of aneurysm or adenopathy. Reproductive:  Prior hysterectomy.  No adnexal masses. Other: Small amount of free fluid in the abdomen and pelvis. No free air. Musculoskeletal: No acute bony abnormality. Stable mild chronic compression fractures in the lower thoracic spine and upper lumbar spine. IMPRESSION: Numerous rim enhancing lesions throughout the liver measuring up to 4 cm compatible with metastases. These are new since prior study. Hepatic steatosis. Cystic appearance of the mass in the pancreatic head which could be related to necrosis. Biliary stent in place with pneumobilia. Small amount of free fluid in the abdomen or pelvis. Left colonic diverticulosis.  No active diverticulitis. Electronically Signed   By: Rolm Baptise M.D.   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 Hg 6.8 on 10/25 (baseline Hg 9.3) ->Transfused 1 unit of PRBCs at the cancer center 10/26 -> Hg 9.2 -> 8.9 -> 8.8 -> Today Hg 9.8. She reported passing at least 5 small maroon bloody stools overnight on 10/27. She drank one dose of Movi prep last night but was unable to tolerate the 2nd dose due to abdominal cramping. No N/V. CTAP 10/27  showed evidence numerous liver lesions (likely metastatic lesions),  a 1.7 cm cystic lesion to the HOP possibly reflecting necrosis, biliary stent in place with pneumobilia and diverticulosis without evidence of diverticulitis.  -NPO -IV fluids 75cc/hr -Patient agreed to drink 2nd dose of Movi prep as tolerated over the next hour then NPO -Water  enema at 11am if not passing clear water from the rectum  -Proceed with Egd and colonoscopy this afternoon as scheduled -Continue to monitor patient closely for active GI bleeding -Transfuse for Hg < 7  -Continue PPI IV twice daily -Stat abdominal/pelvic CT scan with oral and IV contrast -Oncology following    2) Bilateral lower extremity DVT 12/01/2020, received 2 doses of Lovenox  -Hold Lovenox for now   3) History of tubular adenomatous colon polyps per colonoscopy 11/2019   Principal Problem:   GIB (gastrointestinal bleeding) Active Problems:   Primary cancer of head of pancreas ([place])   DMII (diabetes mellitus, type 2) ([place])   HTN (hypertension)   History of atrial fibrillation   DVT of lower extremity, bilateral ([place])     LOS: 2 days   Patrecia Pour Kennedy-Smith  12/04/2020, 09:13 AM

## 2020-12-04 NOTE — Progress Notes (Addendum)
HEMATOLOGY-ONCOLOGY PROGRESS NOTE  SUBJECTIVE: Nancy Hawkins is scheduled for EGD and colonoscopy today.  She is completing the prep this morning.  She denies recurrent bleeding.  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[place])   01/29/2020 Cancer Staging   Staging form: Exocrine Pancreas, AJCC 8th Edition - Clinical: Stage IB (cT2, cN0, cM0) - Signed by Ladell Pier, MD on 01/29/2020    03/23/2020 -  Chemotherapy    Patient is on Treatment Plan: PANCREAS MODIFIED FOLFIRINOX Q14D X 4 CYCLES        PHYSICAL EXAMINATION:  Vitals:   12/03/20 2049 12/04/20 0453  BP: 114/64 (!) 131/57  Pulse: 78 69  Resp: 18 18  Temp: 97.7 F (36.5 C) 98.2 F (36.8 C)  SpO2: 100% 100%   Filed Weights   12/02/20 1835  Weight: 53.3 kg    Intake/Output from previous day: 10/27 0701 - 10/28 0700 In: 3 [I.V.:3] Out: -   GENERAL: Chronically ill-appearing female, no distress SKIN: skin color, texture, turgor are normal, no rashes or significant lesions EYES: normal, Conjunctiva are pink and non-injected, sclera clear OROPHARYNX:no exudate, no erythema and lips, buccal mucosa, and tongue normal  LUNGS: clear to auscultation and percussion with normal breathing effort HEART: regular rate & rhythm and no murmurs.  Trace edema in the bilateral lower extremities ABDOMEN:abdomen soft, non-tender and normal bowel sounds NEURO: alert & oriented x 3 with fluent speech, no focal motor/sensory deficits Vascular: Mild swelling of the right lower leg  LABORATORY DATA:  I have reviewed the data as listed CMP Latest Ref Rng & Units 12/04/2020 12/03/2020 12/02/2020  Glucose 70 - 99 mg/dL 302(H) 115(H) 168(H)  BUN 8 - 23 mg/dL 12 13 15   Creatinine 0.44 - 1.00 mg/dL 0.49 0.45 0.38(L)  Sodium 135 - 145 mmol/L 135 138 135  Potassium 3.5 - 5.1 mmol/L 4.0 3.3(L) 3.6  Chloride 98 - 111 mmol/L 110 111 105  CO2 22 - 32  mmol/L 17(L) 21(L) 20(L)  Calcium 8.9 - 10.3 mg/dL 7.8(L) 7.8(L) 7.9(L)  Total Protein 6.5 - 8.1 g/dL 5.9(L) 5.5(L) 5.8(L)  Total Bilirubin 0.3 - 1.2 mg/dL 1.1 1.4(H) 2.1(H)  Alkaline Phos 38 - 126 U/L 318(H) 277(H) 317(H)  AST 15 - 41 U/L 34 26 26  ALT 0 - 44 U/L 26 23 22     Lab Results  Component Value Date   WBC 11.6 (H) 12/04/2020   HGB 9.8 (L) 12/04/2020   HCT 30.3 (L) 12/04/2020   MCV 88.6 12/04/2020   PLT 191 12/04/2020   NEUTROABS 9.5 (H) 12/04/2020    CT ABDOMEN PELVIS W CONTRAST  Result Date: 12/03/2020 CLINICAL DATA:  Anemia, central abdominal pain. Known pancreatic cancer. EXAM: CT ABDOMEN AND PELVIS WITH CONTRAST TECHNIQUE: Multidetector CT imaging of the abdomen and pelvis was performed using the standard protocol following bolus administration of intravenous contrast. CONTRAST:  50mL OMNIPAQUE IOHEXOL 350 MG/ML SOLN COMPARISON:  06/15/2020 FINDINGS: Lower chest: Lung bases are clear. No effusions. Heart is normal size. Hepatobiliary: Numerous low-density masses with rim enhancement noted throughout the liver compatible with metastases. The largest is in the right hepatic dome measuring 3.9 cm. Biliary stent noted in the common bile duct with pneumobilia. Diffuse low-density throughout the liver compatible with fatty infiltration. Prior cholecystectomy. Pancreas: In the area of the prior mass in the pancreatic head is a low-density cystic area measuring 1.7 cm. This may reflect necrosis of  the previously cyst in pancreatic head mass. Mild biliary ductal dilatation. Spleen: No focal abnormality.  Normal size. Adrenals/Urinary Tract: No hydronephrosis. No renal or adrenal mass. Urinary bladder unremarkable. Stomach/Bowel: Left colonic diverticulosis. No active diverticulitis. Stomach and small bowel decompressed, unremarkable. Vascular/Lymphatic: Aortic atherosclerosis. No evidence of aneurysm or adenopathy. Reproductive: Prior hysterectomy.  No adnexal masses. Other: Small amount  of free fluid in the abdomen and pelvis. No free air. Musculoskeletal: No acute bony abnormality. Stable mild chronic compression fractures in the lower thoracic spine and upper lumbar spine. IMPRESSION: Numerous rim enhancing lesions throughout the liver measuring up to 4 cm compatible with metastases. These are new since prior study. Hepatic steatosis. Cystic appearance of the mass in the pancreatic head which could be related to necrosis. Biliary stent in place with pneumobilia. Small amount of free fluid in the abdomen or pelvis. Left colonic diverticulosis.  No active diverticulitis. Electronically Signed   By: Rolm Baptise M.D.   On: 12/03/2020 18:27   VAS [place] LOWER EXTREMITY VENOUS (DVT)  Result Date: 12/02/2020  Lower Venous DVT Study Patient Name:  Nancy Hawkins  Date of Exam:   12/01/2020 Medical Rec #: [id number]    Accession #:    [id number] Date of Birth: Jun 28, 1940     Patient Gender: F Patient Age:   80 years Exam Location:  [facility] Procedure:      VAS [place] LOWER EXTREMITY VENOUS (DVT) Referring Phys: Ned Card --------------------------------------------------------------------------------  Indications: Bilateral leg swelling and SOB x 3 weeks. Patient takes aspirin daily.  Risk Factors: Cancer Pancreatic. Comparison Study: None Performing Technologist: Alecia Mackin RVT, RDCS (AE), RDMS  Examination Guidelines: A complete evaluation includes B-mode imaging, spectral Doppler, color Doppler, and power Doppler as needed of all accessible portions of each vessel. Bilateral testing is considered an integral part of a complete examination. Limited examinations for reoccurring indications may be performed as noted. The reflux portion of the exam is performed with the patient in reverse Trendelenburg.  +---------+---------------+---------+-----------+---------------+--------------+ RIGHT    CompressibilityPhasicitySpontaneityProperties     Thrombus Aging  +---------+---------------+---------+-----------+---------------+--------------+ CFV      Full           Yes      Yes                                      +---------+---------------+---------+-----------+---------------+--------------+ SFJ      Full           Yes      Yes                                      +---------+---------------+---------+-----------+---------------+--------------+ FV Prox  Full           Yes      Yes                                      +---------+---------------+---------+-----------+---------------+--------------+ FV Mid   Full           Yes      Yes                                      +---------+---------------+---------+-----------+---------------+--------------+ FV DistalPartial  Yes      Yes        seen in        Age                                                        duplicating    Indeterminate                                              vein only                     +---------+---------------+---------+-----------+---------------+--------------+ PFV      Full                                                             +---------+---------------+---------+-----------+---------------+--------------+ POP      Partial        Yes      No         with striationsAge                                                                       Indeterminate  +---------+---------------+---------+-----------+---------------+--------------+ PTV      None           No       No         rigid          Age                                                        w/compression  Indeterminate  +---------+---------------+---------+-----------+---------------+--------------+ PERO     None           No       No         rigid          Age                                                        w/compression  Indeterminate  +---------+---------------+---------+-----------+---------------+--------------+  Gastroc  Full                                                             +---------+---------------+---------+-----------+---------------+--------------+ GSV  Full           Yes      Yes                                      +---------+---------------+---------+-----------+---------------+--------------+   +---------+---------------+---------+-----------+---------------+--------------+ LEFT     CompressibilityPhasicitySpontaneityProperties     Thrombus Aging +---------+---------------+---------+-----------+---------------+--------------+ CFV      Full           Yes      Yes                                      +---------+---------------+---------+-----------+---------------+--------------+ SFJ      Full           Yes      Yes                                      +---------+---------------+---------+-----------+---------------+--------------+ FV Prox  Full           Yes      Yes                                      +---------+---------------+---------+-----------+---------------+--------------+ FV Mid   Partial        Yes      No         with striationsAge                                                                       Indeterminate  +---------+---------------+---------+-----------+---------------+--------------+ FV DistalPartial        Yes      No         with striationsAge                                                                       Indeterminate  +---------+---------------+---------+-----------+---------------+--------------+ PFV      Full                                                             +---------+---------------+---------+-----------+---------------+--------------+ POP      Partial        Yes      No         with striationsAge  Indeterminate  +---------+---------------+---------+-----------+---------------+--------------+  PTV      None           No       No         rigid          Age                                                        w/compression  Indeterminate  +---------+---------------+---------+-----------+---------------+--------------+ PERO     None           No       No         rigid          Age                                                        w/compression  Indeterminate  +---------+---------------+---------+-----------+---------------+--------------+ Gastroc  Full                                                             +---------+---------------+---------+-----------+---------------+--------------+ GSV      Full           Yes      Yes                                      +---------+---------------+---------+-----------+---------------+--------------+    Findings reported to [facility] in [place] 4:05 pm.  Summary: RIGHT: - Findings consistent with age indeterminate deep vein thrombosis involving the right femoral vein, right popliteal vein, right peroneal veins, and right posterior tibial veins.  LEFT: - Findings consistent with age indeterminate deep vein thrombosis involving the left femoral vein, left popliteal vein, left posterior tibial veins, and left peroneal veins.  *See table(s) above for measurements and observations. Electronically signed by Kathlyn Sacramento MD on 12/02/2020 at 9:28:36 AM.    Final     ASSESSMENT AND PLAN: Pancreas cancer, borderline resectable CT abdomen/pelvis-inflammation involving the mid body of the pancreas with peripancreatic fat stranding; possible 2.5 x 2.2 cm low-attenuation mass within the mid body of the pancreas, appears to abut the celiac axis as well as the splenic vein.   MRI 11/18/2019-extensive inflammatory fat stranding about the pancreas with a thinly septated multicystic fluid signal lesion of the anterior pancreatic neck measuring 3.2 x 1.7 cm most consistent with a pancreatic pseudocyst; numerous additional  small fluid signal lesions throughout the pancreatic parenchyma, the remaining lesions subcentimeter; central portion of pancreatic duct dilated measuring up to 8 mm without obvious obstructing lesion identified; trace ascites; trace bilateral pleural effusions and associated atelectasis or consolidation.   Colonoscopy 11/20/2019-5 polyps (resected and retrieved- multiple tubular adenomas, negative for high-grade dysplasia); 2 colonic angioectasias; diverticulosis in the entire examined colon; nonbleeding nonthrombosed external and internal hemorrhoids.   EGD/EUS 11/20/2019-few white nummular lesions in the entire esophagus (esophagus biopsy with no significant pathologic  findings, no fungal elements); hematin found in the entire examined stomach; gastritis (stomach biopsy showed gastropathy/gastritis); 14 mm semisessile polyp found in the posterior wall of the gastric antrum (hyperplastic polyp); a lesion was identified in the pancreatic body staged T2 N0 MX.  FNA of the pancreas lesion showed benign reactive/reparative changes, prominent inflammation.   EUS procedure 01/20/2020-masslike region identified in the pancreatic head, stage T2 N0 MX.  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 CTs chest/pelvis 01/29/2020-no evidence of metastatic disease CT abdomen/pelvis 03/05/2020-increase in size of pancreas head/uncinate mass, marked narrowing of the adjacent portal vein, patchy consolidation in the right lower lung, mild intrahepatic and extrahepatic biliary ductal dilatation Cycle 1 FOLFOX 03/23/2020 Cycle 2 FOLFOX 04/06/2020, Emend added for delayed nausea after cycle 1 Cycle 3 FOLFIRINOX 04/20/2020 Cycle 4 FOLFIRINOX 05/04/2020, Udenyca Cycle 5 FOLFIRINOX 05/18/2020, Udenyca Cycle 6 FOLFIRINOX 06/02/2020, Udenyca CTs 06/15/2020- decreased size of pancreatic mass, still with portal vein involvement and extensive upper abdominal collaterals. 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  The upper third of the main bile duct and left and right hepatic ducts and all intrahepatic branches moderately dilated secondary to the stricture.  Biliary sphincterotomy performed.  Covered metal biliary stent placed into the common bile duct. 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  She is scheduled for EGD and colonoscopy later today.  She had a CT of the abdomen/pelvis performed yesterday which showed numerous liver lesions which are new since the prior study.  CT does not show much change in the pancreatic head mass. It is possible that her tumor has eroded into her GI tract causing the bleed.  If GI bleed is tumor related, will need to consider her for an IVC filter.  Recommend monitoring hemoglobin closely and transfuse if hemoglobin is below 8 with active bleeding.  She has a new diagnosis of bilateral lower extremity DVTs.  She was started on anticoagulation which is an placed on hold secondary to GI bleed.  Awaiting GI work-up, but will defer to hospitalist and GI to determine if she needs IVC filter versus resuming Lovenox.  Discussed evidence of disease progression on CT scan with the patient.  We will consider her for gemcitabine/Abraxane as an outpatient.  Recommendations: 1.  Await work-up from GI. 2.  Will defer to hospitalist and GI regarding placement of IVC filter versus heparin/Lovenox 3.   Monitor CBC and transfuse for hemoglobin less than 8 with active bleeding. 4.  We will consider her for outpatient gemcitabine/Abraxane.  Please call medical oncology over the weekend if there are questions.  We will check on her again on Monday if she remains in the hospital.  Future Appointments  Date Time Provider [place]  12/10/2020  8:15 AM [facility] PHLEBOTOMIST [facility] None  12/10/2020  8:45 AM [facility] FLUSH ROOM [facility] None  12/10/2020  9:15 AM Owens Shark, NP [facility] None      LOS: 2 days   Mikey Bussing, DNP, AGPCNP-BC, AOCNP 12/04/20  Nancy Hawkins was interviewed and examined.  I reviewed the abdomen CT images and  discussed the findings with her.  She has developed liver metastases.  We discussed comfort care versus a trial of second line chemotherapy.  I recommend gemcitabine/Abraxane chemotherapy.  We can discuss this further next week and arrange for outpatient chemotherapy if she decides to proceed.  The EGD/colonoscopy findings by Dr. Rush Landmark are noted.  I agree with holding anticoagulation for now.  We can consider a trial of heparin followed by Lovenox anticoagulation early next week versus placement of an IVC filter and no anticoagulation therapy.  Please call oncology over the weekend as needed.  I will see her 12/07/2020.  I was present for greater than 50% of today's visit.  I performed medical decision making.  Julieanne Manson, MD

## 2020-12-04 NOTE — Interval H&P Note (Signed)
History and Physical Interval Note:  12/04/2020 12:43 PM  Nancy Hawkins  has presented today for surgery, with the diagnosis of Anemia, Heme + stool.  The various methods of treatment have been discussed with the patient and family. After consideration of risks, benefits and other options for treatment, the patient has consented to  Procedure(s): ESOPHAGOGASTRODUODENOSCOPY (EGD) (N/A) COLONOSCOPY WITH PROPOFOL (N/A) as a surgical intervention.  The patient's history has been reviewed, patient examined, no change in status, stable for surgery.  I have reviewed the patient's chart and labs.  Questions were answered to the patient's satisfaction.     [employer]

## 2020-12-04 NOTE — Care Management Important Message (Signed)
Medicare IM printed for W/[employer] Social Work to give to the patient. 

## 2020-12-04 NOTE — Anesthesia Preprocedure Evaluation (Addendum)
Anesthesia Evaluation  Patient identified by MRN, date of birth, ID band Patient awake    Reviewed: Allergy & Precautions, NPO status , Patient's Chart, lab work & pertinent test results  Airway Mallampati: I  TM Distance: >3 FB Neck ROM: Full    Dental no notable dental hx. (+) Teeth Intact, Dental Advisory Given   Pulmonary neg pulmonary ROS,    breath sounds clear to auscultation       Cardiovascular hypertension, Pt. on home beta blockers and Pt. on medications + dysrhythmias (remote h/o afib) Atrial Fibrillation  Rhythm:Regular Rate:Normal     Neuro/Psych negative neurological ROS  negative psych ROS   GI/Hepatic Neg liver ROS, GERD  Medicated and Controlled,  Endo/Other  diabetes, Type 2, Insulin Dependent  Renal/GU negative Renal ROS     Musculoskeletal negative musculoskeletal ROS (+)   Abdominal   Peds  Hematology negative hematology ROS (+)   Anesthesia Other Findings pancreatic adenocarcinoma  Reproductive/Obstetrics                            Anesthesia Physical  Anesthesia Plan  ASA: 3  Anesthesia Plan: MAC   Post-op Pain Management:    Induction: Intravenous  PONV Risk Score and Plan: 2 and Propofol infusion and Treatment may vary due to age or medical condition  Airway Management Planned: Natural Airway  Additional Equipment:   Intra-op Plan:   Post-operative Plan:   Informed Consent: I have reviewed the patients History and Physical, chart, labs and discussed the procedure including the risks, benefits and alternatives for the proposed anesthesia with the patient or authorized representative who has indicated his/her understanding and acceptance.     Dental advisory given  Plan Discussed with: CRNA  Anesthesia Plan Comments:         Anesthesia Quick Evaluation

## 2020-12-04 NOTE — Progress Notes (Signed)
Gastroenterology Progress Note  CC:  GI bleed, maroon stools with anemia   Subjective: She drank one dose of Movi prep yesterday evening, she was unable to tolerate the 2nd dose due to abdominal cramping. No N/V.  She is passing clear yellow color water per the rectum as verified by Edwin Cap. She denies having any abdominal pain at this time. No CP or SOB. No family at the bedside. She wishes to proceed with an EGD and colonoscopy later today as scheduled.   Objective:   CTAP 12/03/2020: Numerous rim enhancing lesions throughout the liver measuring up to 4 cm compatible with metastases. These are new since prior study.  Hepatic steatosis.  Cystic appearance of the mass in the pancreatic head which could be related to necrosis.  Biliary stent in place with pneumobilia.  Small amount of free fluid in the abdomen or pelvis.  Left colonic diverticulosis.  No active diverticulitis.   Vital signs in last 24 hours: Temp:  [97.7 F (36.5 C)-98.3 F (36.8 C)] 98.2 F (36.8 C) (10/28 0453) Pulse Rate:  [69-78] 69 (10/28 0453) Resp:  [17-18] 18 (10/28 0453) BP: (114-131)/(57-64) 131/57 (10/28 0453) SpO2:  [99 %-100 %] 100 % (10/28 0453) Last BM Date: 12/03/20 General:   Alert fatigued appearing female in NAD. Heart:  RRR, no murmur.  Pulm:  Breath sounds clear throughout.  Abdomen: Soft, nondistended. Nontender. + BS x 4 quads.  Extremities:  Without edema. Neurologic:  Alert and  oriented x4;  grossly normal neurologically. Psych:  Alert and cooperative. Normal mood and affect.  Intake/Output from previous day: 10/27 0701 - 10/28 0700 In: 3 [I.V.:3] Out: -  Intake/Output this shift: No intake/output data recorded.  Lab Results: Recent Labs    12/02/20 1421 12/02/20 2116 12/03/20 0355 12/03/20 1625 12/04/20 0031  WBC 7.6  --  6.4  --  11.6*  HGB 9.2*   < > 8.8* 9.3* 9.8*  HCT 27.9*   < > 26.7* 28.4* 30.3*  PLT 154  --  136*  --  191   < > = values in this  interval not displayed.   BMET Recent Labs    12/02/20 1421 12/03/20 0355 12/04/20 0031  NA 135 138 135  K 3.6 3.3* 4.0  CL 105 111 110  CO2 20* 21* 17*  GLUCOSE 168* 115* 302*  BUN 15 13 12   CREATININE 0.38* 0.45 0.49  CALCIUM 7.9* 7.8* 7.8*   LFT Recent Labs    12/04/20 0031  PROT 5.9*  ALBUMIN 2.3*  AST 34  ALT 26  ALKPHOS 318*  BILITOT 1.1   PT/INR No results for input(s): LABPROT, INR in the last 72 hours. Hepatitis Panel No results for input(s): HEPBSAG, HCVAB, HEPAIGM, HEPBIGM in the last 72 hours.  CT ABDOMEN PELVIS W CONTRAST  Result Date: 12/03/2020 CLINICAL DATA:  Anemia, central abdominal pain. Known pancreatic cancer. EXAM: CT ABDOMEN AND PELVIS WITH CONTRAST TECHNIQUE: Multidetector CT imaging of the abdomen and pelvis was performed using the standard protocol following bolus administration of intravenous contrast. CONTRAST:  22mL OMNIPAQUE IOHEXOL 350 MG/ML SOLN COMPARISON:  06/15/2020 FINDINGS: Lower chest: Lung bases are clear. No effusions. Heart is normal size. Hepatobiliary: Numerous low-density masses with rim enhancement noted throughout the liver compatible with metastases. The largest is in the right hepatic dome measuring 3.9 cm. Biliary stent noted in the common bile duct with pneumobilia. Diffuse low-density throughout the liver compatible with fatty infiltration. Prior cholecystectomy. Pancreas: In the area  of the prior mass in the pancreatic head is a low-density cystic area measuring 1.7 cm. This may reflect necrosis of the previously cyst in pancreatic head mass. Mild biliary ductal dilatation. Spleen: No focal abnormality.  Normal size. Adrenals/Urinary Tract: No hydronephrosis. No renal or adrenal mass. Urinary bladder unremarkable. Stomach/Bowel: Left colonic diverticulosis. No active diverticulitis. Stomach and small bowel decompressed, unremarkable. Vascular/Lymphatic: Aortic atherosclerosis. No evidence of aneurysm or adenopathy. Reproductive:  Prior hysterectomy.  No adnexal masses. Other: Small amount of free fluid in the abdomen and pelvis. No free air. Musculoskeletal: No acute bony abnormality. Stable mild chronic compression fractures in the lower thoracic spine and upper lumbar spine. IMPRESSION: Numerous rim enhancing lesions throughout the liver measuring up to 4 cm compatible with metastases. These are new since prior study. Hepatic steatosis. Cystic appearance of the mass in the pancreatic head which could be related to necrosis. Biliary stent in place with pneumobilia. Small amount of free fluid in the abdomen or pelvis. Left colonic diverticulosis.  No active diverticulitis. Electronically Signed   By: Rolm Baptise M.D.   On: 12/03/2020 18:27    Assessment / Plan: 46) 80 year old female with pancreatic adenocarcinoma admitted to the hospital with anemia and GI bleeding, maroon colored stools after initiating Lovenox for bilateral lower extremity DVT. Hg 6.8 on 10/25 (baseline Hg 9.3) ->Transfused 1 unit of PRBCs at the cancer center 10/26 -> Hg 9.2 -> 8.9 -> 8.8 -> Today Hg 9.8. She reported passing at least 5 small maroon bloody stools overnight on 10/27. She drank one dose of Movi prep last night but was unable to tolerate the 2nd dose due to abdominal cramping. No N/V. CTAP 10/27  showed evidence numerous liver lesions (likely metastatic lesions),  a 1.7 cm cystic lesion to the HOP possibly reflecting necrosis, biliary stent in place with pneumobilia and diverticulosis without evidence of diverticulitis.  -NPO -IV fluids 75cc/hr -Patient agreed to drink 2nd dose of Movi prep as tolerated over the next hour then NPO -Water  enema at 11am if not passing clear water from the rectum  -Proceed with Egd and colonoscopy this afternoon as scheduled -Continue to monitor patient closely for active GI bleeding -Transfuse for Hg < 7  -Continue PPI IV twice daily -Stat abdominal/pelvic CT scan with oral and IV contrast -Oncology following    2) Bilateral lower extremity DVT 12/01/2020, received 2 doses of Lovenox  -Hold Lovenox for now   3) History of tubular adenomatous colon polyps per colonoscopy 11/2019   Principal Problem:   GIB (gastrointestinal bleeding) Active Problems:   Primary cancer of head of pancreas ([place])   DMII (diabetes mellitus, type 2) ([place])   HTN (hypertension)   History of atrial fibrillation   DVT of lower extremity, bilateral ([place])     LOS: 2 days   Nancy Hawkins  12/04/2020, 09:13 AM

## 2020-12-04 NOTE — Op Note (Signed)
[facility] Patient Name: Nancy Hawkins Procedure Date: 12/04/2020 MRN: [id number] Attending MD: [name] , MD Date of Birth: [birth year] CSN: [id number] Age: 80 Admit Type: Inpatient Procedure:                Upper GI endoscopy Indications:              Melena, Occult blood in stool, Recent                            gastrointestinal bleeding, Personal history of                            malignant neoplasm Providers:                [name], MD, Jaci Carrel, RN, Tyna Jaksch Technician Referring MD:             Izola Price. Sherrill, [facility] Medicines:                Monitored Anesthesia Care Complications:            No immediate complications. Estimated Blood Loss:     Estimated blood loss was minimal. Procedure:                Pre-Anesthesia Assessment:                           - Prior to the procedure, a History and Physical                            was performed, and patient medications and                            allergies were reviewed. The patient's tolerance of                            previous anesthesia was also reviewed. The risks                            and benefits of the procedure and the sedation                            options and risks were discussed with the patient.                            All questions were answered, and informed consent                            was obtained. Prior Anticoagulants: The patient                            last took Lovenox (enoxaparin) 2 days prior to the  procedure. ASA Grade Assessment: III - A patient                            with severe systemic disease. After reviewing the                            risks and benefits, the patient was deemed in                            satisfactory condition to undergo the procedure.                           After obtaining informed consent, the endoscope was                             passed under direct vision. Throughout the                            procedure, the patient's blood pressure, pulse, and                            oxygen saturations were monitored continuously. The                            [id number]-[id number] ([id number]) Olympus endoscope was introduced                            through the mouth, and advanced to the second part                            of duodenum. The upper GI endoscopy was                            accomplished without difficulty. The patient                            tolerated the procedure. Scope In: Scope Out: Findings:      No gross lesions were noted in the proximal esophagus and in the mid       esophagus.      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      The Z-line was irregular and was found 36 cm from the incisors.      A 2 cm hiatal hernia was present.      Diffuse moderate inflammation characterized by erythema, friability and       granularity was found in the gastric antrum and in the prepyloric region       of the stomach.      No other gross lesions were noted in the entire examined stomach.       Biopsies were taken with a cold forceps for histology and Helicobacter       pylori testing.      No gross lesions were noted in the duodenal bulb.      A previously placed metal biliary stent was seen at the major papilla.  Localized mildly congested mucosa without active bleeding and with no       stigmata of bleeding was found in the area of the papilla surrounding       the stent with mild erosive changes at the distal margin of stent but       overt bleeding or vessels are not appreciated with brown bilious stained       tissue throughout. Impression:               - No gross lesions in esophagus proximally. 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                           - Z-line irregular, 36 cm from the incisors.                           - 2 cm  hiatal hernia.                           - Gastritis in antrum/prepylorus - query radiation                            induced vs HP related vs developing GAVE. No other                            gross lesions in the stomach. Biopsied.                           - No gross lesions in the duodenal bulb.                           - Metal biliary stent in the duodenum with mild                            congested mucosa at the distal aspect of the stent. Moderate Sedation:      Not Applicable - Patient had care per Anesthesia. Recommendation:           - Proceed to scheduled colonoscopy.                           - Await pathology results.                           - Observe patient's clinical course.                           - Continue at least PPI 40 mg daily.                           - Consider Carafate twice daily.                           - Query role of NSBB to decrease risk of bleeding  from esophageal varices (though unclear her BP or                            HRs could tolerate this).                           - Not clear an upper GI source for her reported                            maroon stool. The findings could be reason for                            chronic iron deficiency. Consideration of GAVE and                            role of endoscopic therapy can be considered in                            future.                           - Anticoagulation thoughts will be on the                            Colonoscopy report.                           - The findings and recommendations were discussed                            with the patient.                           - The findings and recommendations were discussed                            with the patient's family.                           - The findings and recommendations were discussed                            with the referring physician. Procedure Code(s):        --- Professional  ---                           [phone], Esophagogastroduodenoscopy, flexible,                            transoral; with biopsy, single or multiple Diagnosis Code(s):        --- Professional ---                           I85.00, Esophageal varices without bleeding  K22.8, Other specified diseases of esophagus                           K44.9, Diaphragmatic hernia without obstruction or                            gangrene                           K29.70, Gastritis, unspecified, without bleeding                           K31.89, Other diseases of stomach and duodenum                           K92.1, Melena (includes Hematochezia)                           R19.5, Other fecal abnormalities                           K92.2, Gastrointestinal hemorrhage, unspecified                           Z85.9, Personal history of malignant neoplasm,                            unspecified CPT copyright 2019 American [employer]. All rights reserved. The codes documented in this report are preliminary and upon coder review may  be revised to meet current compliance requirements. [name], MD 12/04/2020 2:09:28 PM Number of Addenda: 0

## 2020-12-04 NOTE — Op Note (Addendum)
[facility] Patient Name: Nancy Hawkins Procedure Date: 12/04/2020 MRN: [id number] Attending MD: [name] , MD Date of Birth: 1940-09-10 CSN: [id number] Age: 80 Admit Type: Inpatient Procedure:                Colonoscopy Indications:              Melena, Gastrointestinal occult blood loss, Rectal                            bleeding (maroon stools), Gastrointestinal bleeding Providers:                [name], MD, Jaci Carrel, RN, Tyna Jaksch Technician Referring MD:             Izola Price. Sherrill, [facility] Medicines:                Monitored Anesthesia Care Complications:            No immediate complications. Estimated Blood Loss:     Estimated blood loss was minimal. Procedure:                Pre-Anesthesia Assessment:                           - Prior to the procedure, a History and Physical                            was performed, and patient medications and                            allergies were reviewed. The patient's tolerance of                            previous anesthesia was also reviewed. The risks                            and benefits of the procedure and the sedation                            options and risks were discussed with the patient.                            All questions were answered, and informed consent                            was obtained. Prior Anticoagulants: The patient has                            taken Lovenox (enoxaparin), last dose was 2 days                            prior to procedure. ASA Grade Assessment: III - A  patient with severe systemic disease. After                            reviewing the risks and benefits, the patient was                            deemed in satisfactory condition to undergo the                            procedure.                           After obtaining informed consent, the colonoscope                             was passed under direct vision. Throughout the                            procedure, the patient's blood pressure, pulse, and                            oxygen saturations were monitored continuously. The                            [id number]-HQ190L ([id number] Olympus colonoscope was                            introduced through the anus and advanced to the 5                            cm into the ileum. The colonoscopy was performed                            without difficulty. The patient tolerated the                            procedure. The quality of the bowel preparation was                            good. The terminal ileum, ileocecal valve,                            appendiceal orifice, and rectum were photographed. Scope In: 1:15:00 PM Scope Out: 1:43:32 PM Scope Withdrawal Time: 0 hours 20 minutes 4 seconds  Total Procedure Duration: 0 hours 28 minutes 32 seconds  Findings:      The digital rectal exam findings include hemorrhoids. Pertinent       negatives include no palpable rectal lesions.      The terminal ileum and ileocecal valve appeared normal.      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 Fulguration to ablate the lesion to       prevent bleeding by  argon plasma was successful. To prevent bleeding       post-intervention, two hemostatic clips were successfully placed (MR       conditional) on the larger lesions. There was no bleeding during, or at       the end, of the procedure.      Many small-mouthed diverticula were found in the entire colon.      Normal mucosa was found in the entire colon otherwise.      Non-bleeding non-thrombosed external and internal hemorrhoids were found       during retroflexion, during perianal exam and during digital exam. The       hemorrhoids were Grade II (internal hemorrhoids  that prolapse but reduce       spontaneously). Impression:               - Hemorrhoids found on digital rectal exam.                           - The examined portion of the ileum was normal.                           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 Treated                            with argon plasma coagulation (APC). Clips (MR                            conditional) were placed on the larger AVMs ablated.                           - Diverticulosis in the entire examined colon.                           - Normal mucosa in the entire examined colon                            otherwise.                           - Non-bleeding non-thrombosed external and internal                            hemorrhoids. Moderate Sedation:      Not Applicable - Patient had care per Anesthesia. Recommendation:           - The patient will be observed post-procedure,                            until all discharge criteria are met.                           - Return patient to hospital ward for ongoing care.                           - Resume previous diet.                           -  Anticoagulation initiation/reinitation is a                            difficult question. She has new varices on the                            upper endoscopy which brings a level of risk while                            thought of anticoagulation are present. From a                            colon standpoint and treatments today, I would not                            entertain anticoagulation no sooner than 72 hours                            to allow healing of mucosa. With this being said,                            if anticoagulation were to be considered, the                            potential role of variceal banding would need to                            come into play as well. Her varices are most likely                            from her metastatic pancreas cancer at this time                             and this may only progress as her disease has                            progressed quickly. Varices are not a                            contraindication but risk increases for bleeding                            while on therapy and then consideration of NSBB or                            banding comes into play. I think, for this patient,                            an IVC filter should be considered by                            Hematology/Oncology and Medicine to decrease risk  of PE. However, due to her tumor burden, if                            anticoagulation is considered and Lovenox is                            preferential in this population then would again                            wait for at least 72 hours prior to initiation.                            Difficult situation.                           - The findings and recommendations were discussed                            with the patient.                           - The findings and recommendations were discussed                            with the patient's family.                           - The findings and recommendations were discussed                            with the referring physician. Procedure Code(s):        --- Professional ---                           [phone], Colonoscopy, flexible; with control of                            bleeding, any method Diagnosis Code(s):        --- Professional ---                           K64.1, Second degree hemorrhoids                           K55.20, Angiodysplasia of colon without hemorrhage                           K92.1, Melena (includes Hematochezia)                           R19.5, Other fecal abnormalities                           K62.5, Hemorrhage of anus and rectum                           K92.2, Gastrointestinal  hemorrhage, unspecified                           K57.30, Diverticulosis of large intestine without                             perforation or abscess without bleeding CPT copyright 2019 American Medical Association. All rights reserved. The codes documented in this report are preliminary and upon coder review may  be revised to meet current compliance requirements. [name], MD 12/04/2020 2:19:11 PM Number of Addenda: 0

## 2020-12-04 NOTE — Anesthesia Procedure Notes (Signed)
Procedure Name: MAC Date/Time: 12/04/2020 12:55 PM Performed by: Lissa Morales, CRNA Pre-anesthesia Checklist: Patient identified, Emergency Drugs available, Suction available and Patient being monitored Patient Re-evaluated:Patient Re-evaluated prior to induction Oxygen Delivery Method: Simple face mask Preoxygenation: Pre-oxygenation with 100% oxygen (POM mask) Placement Confirmation: positive ETCO2

## 2020-12-05 DIAGNOSIS — I82493 Acute embolism and thrombosis of other specified deep vein of lower extremity, bilateral: Secondary | ICD-10-CM | POA: Diagnosis not present

## 2020-12-05 DIAGNOSIS — K922 Gastrointestinal hemorrhage, unspecified: Secondary | ICD-10-CM | POA: Diagnosis not present

## 2020-12-05 LAB — COMPREHENSIVE METABOLIC PANEL
ALT: 23 U/L (ref 0–44)
AST: 26 U/L (ref 15–41)
Albumin: 2 g/dL — ABNORMAL LOW (ref 3.5–5.0)
Alkaline Phosphatase: 254 U/L — ABNORMAL HIGH (ref 38–126)
Anion gap: 4 — ABNORMAL LOW (ref 5–15)
BUN: 11 mg/dL (ref 8–23)
CO2: 21 mmol/L — ABNORMAL LOW (ref 22–32)
Calcium: 7.6 mg/dL — ABNORMAL LOW (ref 8.9–10.3)
Chloride: 109 mmol/L (ref 98–111)
Creatinine, Ser: 0.45 mg/dL (ref 0.44–1.00)
GFR, Estimated: >60 mL/min (ref >60)
Glucose, Bld: 141 mg/dL — ABNORMAL HIGH (ref 70–99)
Potassium: 3 mmol/L — ABNORMAL LOW (ref 3.5–5.1)
Sodium: 134 mmol/L — ABNORMAL LOW (ref 135–145)
Total Bilirubin: 1 mg/dL (ref 0.3–1.2)
Total Protein: 5.2 g/dL — ABNORMAL LOW (ref 6.5–8.1)

## 2020-12-05 LAB — CBC WITH DIFFERENTIAL/PLATELET
Abs Immature Granulocytes: 0.05 10*3/uL (ref 0.00–0.07)
Basophils Absolute: 0 10*3/uL (ref 0.0–0.1)
Basophils Relative: 0 %
Eosinophils Absolute: 0.1 10*3/uL (ref 0.0–0.5)
Eosinophils Relative: 1 %
HCT: 27.1 % — ABNORMAL LOW (ref 36.0–46.0)
Hemoglobin: 8.7 g/dL — ABNORMAL LOW (ref 12.0–15.0)
Immature Granulocytes: 1 %
Lymphocytes Relative: 6 %
Lymphs Abs: 0.6 10*3/uL — ABNORMAL LOW (ref 0.7–4.0)
MCH: 28.4 pg (ref 26.0–34.0)
MCHC: 32.1 g/dL (ref 30.0–36.0)
MCV: 88.6 fL (ref 80.0–100.0)
Monocytes Absolute: 0.9 10*3/uL (ref 0.1–1.0)
Monocytes Relative: 9 %
Neutro Abs: 8.9 10*3/uL — ABNORMAL HIGH (ref 1.7–7.7)
Neutrophils Relative %: 83 %
Platelets: 136 10*3/uL — ABNORMAL LOW (ref 150–400)
RBC: 3.06 MIL/uL — ABNORMAL LOW (ref 3.87–5.11)
RDW: 14.9 % (ref 11.5–15.5)
WBC: 10.6 10*3/uL — ABNORMAL HIGH (ref 4.0–10.5)
nRBC: 0 % (ref 0.0–0.2)

## 2020-12-05 LAB — GLUCOSE, CAPILLARY
Glucose-Capillary: 125 mg/dL — ABNORMAL HIGH (ref 70–99)
Glucose-Capillary: 175 mg/dL — ABNORMAL HIGH (ref 70–99)
Glucose-Capillary: 136 mg/dL — ABNORMAL HIGH (ref 70–99)
Glucose-Capillary: 155 mg/dL — ABNORMAL HIGH (ref 70–99)

## 2020-12-05 LAB — MAGNESIUM: Magnesium: 1.7 mg/dL (ref 1.7–2.4)

## 2020-12-05 MED ORDER — MAGNESIUM SULFATE 2 GM/50ML IV SOLN
2.0000 g | Freq: Once | INTRAVENOUS | Status: AC
  Administered 2020-12-05: 2 g via INTRAVENOUS
  Filled 2020-12-05: qty 50

## 2020-12-05 MED ORDER — POTASSIUM CHLORIDE CRYS ER 20 MEQ PO TBCR
40.0000 meq | EXTENDED_RELEASE_TABLET | ORAL | Status: AC
Start: 2020-12-05 — End: 2020-12-05
  Administered 2020-12-05 (×2): 40 meq via ORAL
  Filled 2020-12-05 (×2): qty 2

## 2020-12-05 NOTE — Progress Notes (Signed)
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 fib ~15 years ago.  She was last seen by oncology, Dr. Benay Spice on 10/20/2020 after having completed SBRT approximately 2 months prior. She then returned on 10/25 for follow-up and was found to have Hgb 6.8 g/dL and was arranged to have blood transfusion (which would take place on 10/26).  She also underwent work-up for lower extremity edema noted on her 10/25 visit with a LE duplex.  This was performed on 12/01/2020 and revealed extensive bilateral lower extremity DVT with age-indeterminate clots.  After risks and benefits were discussed, she decided to start Lovenox.  She had her first dose approximately 1 PM on 12/01/2020 and her second dose around 11 AM on 12/02/2020.  When she presented for blood transfusions on 12/02/2020, she noticed having maroon stools with her bowel movements and mild amount of blood when wiping. Due to this, she was referred for direct admission and GI evaluation given concern for possible involvement from her GI tract related to underlying pancreatic cancer.   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    Left: age indeterminate deep vein thrombosis involving the left femoral vein, left popliteal vein, left posterior tibial veins, and left peroneal veins. - Lovenox started 10/25; only had 2 doses prior to admission - discussed with GI and oncology after EGD/CLN on 10/28: plan is to start heparin (no bolus) on Monday (if tolerates anticoagulation then possibly Lovenox again); if does have re-bleeding then IVC to be further considered)  Primary cancer of head of pancreas [facility]) - diagnosed after EUS Dec 2021 - s/p SBRT - follows with Dr. Benay Spice - CT A/P on 10/27 shows new numerous hepatic lesions compatible with metastasis  -Evaluated by oncology during hospitalization as well.  Next steps will either be trial of second line chemo versus transitioning to hospice -Further plans to likely be determined after discharge  History of atrial fibrillation - Distant and remote history per patient, approximately 15 years ago - Not on anticoagulation - On Cardizem at home as part of blood pressure regimen  HTN (hypertension) - BP meds on hold in setting of GI bleed - Use labetalol or hydralazine as needed  DMII (diabetes mellitus, type 2) (HCC) - last A1c 7.2 % on 03/05/20 - repeat A1c is 6.9% this admission  - SSI and CBG monitoring      Subjective:  No events overnight.  Sitting in bed in no distress.  Daughter is present in room this morning and update given with questions answered.  Reviewed the overall plan with them (as noted below) and they were in agreement  with understanding.  Objective Vitals:   12/04/20 1415 12/04/20 2013 12/05/20 0510 12/05/20 1322  BP: (!) 115/48 (!) 143/57 (!) 118/50 116/62  Pulse: 64 74 71 73  Resp: 16 16 18 17   Temp:  97.6 F (36.4 C) 98 F (36.7 C) (!) 97.3 F (36.3 C)  TempSrc:  Oral Oral Oral  SpO2: 100% 98% 96% 100%  Weight:      Height:       53.3 kg  Vital signs were reviewed and  unremarkable.   Exam Physical Exam Constitutional:      Appearance: Normal appearance.  HENT:     Head: Normocephalic and atraumatic.     Mouth/Throat:     Mouth: Mucous membranes are moist.  Eyes:     Extraocular Movements: Extraocular movements intact.  Cardiovascular:     Rate and Rhythm: Normal rate and regular rhythm.  Pulmonary:     Effort: Pulmonary effort is normal.     Breath sounds: Normal breath sounds.  Abdominal:     General: Bowel sounds are normal. There is no distension.     Palpations: Abdomen is soft.     Tenderness: There is no abdominal tenderness.  Musculoskeletal:        General: Normal range of motion.     Cervical back: Normal range of motion and neck supple.  Skin:    General: Skin is warm and dry.  Neurological:     General: No focal deficit present.     Mental Status: She is alert.  Psychiatric:        Mood and Affect: Mood normal.        Behavior: Behavior normal.     Labs / Other Information My review of labs, imaging, notes and other tests shows no new significant findings.    Disposition Plan: Status is: Inpatient  Remains inpatient appropriate because: ongoing treatment of above      Time spent: Greater than 50% of the 35 minute visit was spent in counseling/coordination of care for the patient as laid out in the A&P.  Dwyane Dee, MD [facility] 12/05/2020, 2:30 PM

## 2020-12-06 DIAGNOSIS — K922 Gastrointestinal hemorrhage, unspecified: Secondary | ICD-10-CM | POA: Diagnosis not present

## 2020-12-06 DIAGNOSIS — I82493 Acute embolism and thrombosis of other specified deep vein of lower extremity, bilateral: Secondary | ICD-10-CM | POA: Diagnosis not present

## 2020-12-06 LAB — CBC WITH DIFFERENTIAL/PLATELET
Abs Immature Granulocytes: 0.05 10*3/uL (ref 0.00–0.07)
Basophils Absolute: 0 10*3/uL (ref 0.0–0.1)
Basophils Relative: 0 %
Eosinophils Absolute: 0.1 10*3/uL (ref 0.0–0.5)
Eosinophils Relative: 1 %
HCT: 25.4 % — ABNORMAL LOW (ref 36.0–46.0)
Hemoglobin: 8.2 g/dL — ABNORMAL LOW (ref 12.0–15.0)
Immature Granulocytes: 1 %
Lymphocytes Relative: 8 %
Lymphs Abs: 0.7 10*3/uL (ref 0.7–4.0)
MCH: 28.8 pg (ref 26.0–34.0)
MCHC: 32.3 g/dL (ref 30.0–36.0)
MCV: 89.1 fL (ref 80.0–100.0)
Monocytes Absolute: 1 10*3/uL (ref 0.1–1.0)
Monocytes Relative: 11 %
Neutro Abs: 6.5 10*3/uL (ref 1.7–7.7)
Neutrophils Relative %: 79 %
Platelets: 112 10*3/uL — ABNORMAL LOW (ref 150–400)
RBC: 2.85 MIL/uL — ABNORMAL LOW (ref 3.87–5.11)
RDW: 14.7 % (ref 11.5–15.5)
WBC: 8.3 10*3/uL (ref 4.0–10.5)
nRBC: 0 % (ref 0.0–0.2)

## 2020-12-06 LAB — GLUCOSE, CAPILLARY
Glucose-Capillary: 153 mg/dL — ABNORMAL HIGH (ref 70–99)
Glucose-Capillary: 179 mg/dL — ABNORMAL HIGH (ref 70–99)
Glucose-Capillary: 267 mg/dL — ABNORMAL HIGH (ref 70–99)
Glucose-Capillary: 181 mg/dL — ABNORMAL HIGH (ref 70–99)

## 2020-12-06 LAB — COMPREHENSIVE METABOLIC PANEL
ALT: 23 U/L (ref 0–44)
AST: 33 U/L (ref 15–41)
Albumin: 1.9 g/dL — ABNORMAL LOW (ref 3.5–5.0)
Alkaline Phosphatase: 244 U/L — ABNORMAL HIGH (ref 38–126)
Anion gap: 4 — ABNORMAL LOW (ref 5–15)
BUN: 9 mg/dL (ref 8–23)
CO2: 21 mmol/L — ABNORMAL LOW (ref 22–32)
Calcium: 7.1 mg/dL — ABNORMAL LOW (ref 8.9–10.3)
Chloride: 108 mmol/L (ref 98–111)
Creatinine, Ser: 0.38 mg/dL — ABNORMAL LOW (ref 0.44–1.00)
GFR, Estimated: >60 mL/min (ref >60)
Glucose, Bld: 171 mg/dL — ABNORMAL HIGH (ref 70–99)
Potassium: 3.9 mmol/L (ref 3.5–5.1)
Sodium: 133 mmol/L — ABNORMAL LOW (ref 135–145)
Total Bilirubin: 1 mg/dL (ref 0.3–1.2)
Total Protein: 4.9 g/dL — ABNORMAL LOW (ref 6.5–8.1)

## 2020-12-06 LAB — MAGNESIUM: Magnesium: 1.8 mg/dL (ref 1.7–2.4)

## 2020-12-06 NOTE — Progress Notes (Signed)
Progress Note    Nancy Hawkins   [id number]:[id number]  DOB: 1940-09-15  DOA: 12/02/2020     4 Date of Service: 12/06/2020   Clinical Course Ms. 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 fib ~15 years ago.  She was last seen by oncology, Dr. Benay Spice on 10/20/2020 after having completed SBRT approximately 2 months prior. She then returned on 10/25 for follow-up and was found to have Hgb 6.8 g/dL and was arranged to have blood transfusion (which would take place on 10/26).  She also underwent work-up for lower extremity edema noted on her 10/25 visit with a LE duplex.  This was performed on 12/01/2020 and revealed extensive bilateral lower extremity DVT with age-indeterminate clots.  After risks and benefits were discussed, she decided to start Lovenox.  She had her first dose approximately 1 PM on 12/01/2020 and her second dose around 11 AM on 12/02/2020.  When she presented for blood transfusions on 12/02/2020, she noticed having maroon stools with her bowel movements and mild amount of blood when wiping. Due to this, she was referred for direct admission and GI evaluation given concern for possible involvement from her GI tract related to underlying pancreatic cancer.   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) - Underwent work-up for asymmetric lower extremity edema on 12/01/2020 - Duplex shows:     Right: age indeterminate deep vein thrombosis involving the right femoral vein, right popliteal vein, right  peroneal veins, and right posterior tibial veins.    Left: age indeterminate deep vein thrombosis involving the left femoral vein, left popliteal vein, left posterior tibial veins, and left peroneal veins. - Lovenox started 10/25; only had 2 doses prior to admission - discussed with GI and oncology after EGD/CLN on 10/28: plan is to start heparin (no bolus) on Monday (if tolerates anticoagulation then possibly Lovenox again); if does have re-bleeding then IVC to be further considered)  Primary cancer of head of pancreas [facility]) - diagnosed after EUS Dec 2021 - s/p SBRT - follows with Dr. Benay Spice - CT A/P on 10/27 shows new numerous hepatic lesions compatible with metastasis  -Evaluated by oncology during hospitalization as well.  Next steps will either be trial of second line chemo versus transitioning to hospice -Further plans to likely be determined after discharge  History of atrial fibrillation - Distant and remote history per patient, approximately 15 years ago - Not on anticoagulation - On Cardizem at home as part of blood pressure regimen  HTN (hypertension) - BP meds on hold in setting of GI bleed - Use labetalol or hydralazine as needed  DMII (diabetes mellitus, type 2) (HCC) - last A1c 7.2 % on 03/05/20 - repeat A1c is 6.9% this admission  - SSI and CBG monitoring      Subjective:  No events overnight.  Denies any further maroon stools or any blood notably with wiping.  Abdominal discomfort also stable and improved.  Tolerating eating well.  Has been ambulating as needed.  Objective Vitals:   12/05/20 1322  12/05/20 2019 12/06/20 0458 12/06/20 1343  BP: 116/62 (!) 120/56 (!) 111/54 (!) 105/57  Pulse: 73 79 82 80  Resp: 17 17 16 18   Temp: (!) 97.3 F (36.3 C) 99.1 F (37.3 C) 98.7 F (37.1 C) 98.9 F (37.2 C)  TempSrc: Oral Oral Oral Oral  SpO2: 100% 99% 98% 100%  Weight:      Height:       53.3 kg  Vital signs were reviewed and  unremarkable.   Exam Physical Exam Constitutional:      Appearance: Normal appearance.  HENT:     Head: Normocephalic and atraumatic.     Mouth/Throat:     Mouth: Mucous membranes are moist.  Eyes:     Extraocular Movements: Extraocular movements intact.  Cardiovascular:     Rate and Rhythm: Normal rate and regular rhythm.  Pulmonary:     Effort: Pulmonary effort is normal.     Breath sounds: Normal breath sounds.  Abdominal:     General: Bowel sounds are normal. There is no distension.     Palpations: Abdomen is soft.     Tenderness: There is no abdominal tenderness.  Musculoskeletal:        General: Normal range of motion.     Cervical back: Normal range of motion and neck supple.  Skin:    General: Skin is warm and dry.  Neurological:     General: No focal deficit present.     Mental Status: She is alert.  Psychiatric:        Mood and Affect: Mood normal.        Behavior: Behavior normal.     Labs / Other Information My review of labs, imaging, notes and other tests is significant for Hgb 8.2 g/dL     Disposition Plan: Status is: Inpatient  Remains inpatient appropriate because: treatment of above      Time spent: Greater than 50% of the 35 minute visit was spent in counseling/coordination of care for the patient as laid out in the A&P.  Dwyane Dee, MD [facility] 12/06/2020, 2:56 PM

## 2020-12-06 NOTE — Anesthesia Postprocedure Evaluation (Addendum)
Anesthesia Post Note  Patient: Nancy Hawkins  Procedure(s) Performed: ESOPHAGOGASTRODUODENOSCOPY (EGD) COLONOSCOPY WITH PROPOFOL BIOPSY HOT HEMOSTASIS (ARGON PLASMA COAGULATION/BICAP) HEMOSTASIS CLIP PLACEMENT     Patient location during evaluation: Endoscopy Anesthesia Type: MAC Level of consciousness: awake and alert Pain management: pain level controlled Vital Signs Assessment: post-procedure vital signs reviewed and stable Respiratory status: spontaneous breathing Cardiovascular status: stable Anesthetic complications: no   No notable events documented.  Last Vitals:  Vitals:   12/06/20 1343 12/06/20 2022  BP: (!) 105/57 (!) 131/58  Pulse: 80 (!) 104  Resp: 18 16  Temp: 37.2 C 36.9 C  SpO2: 100% 100%    Last Pain:  Vitals:   12/06/20 2153  TempSrc:   PainSc: [place]

## 2020-12-07 ENCOUNTER — Other Ambulatory Visit: Payer: Self-pay | Admitting: Oncology

## 2020-12-07 ENCOUNTER — Encounter: Payer: Self-pay | Admitting: Gastroenterology

## 2020-12-07 ENCOUNTER — Encounter (HOSPITAL_COMMUNITY): Payer: Self-pay | Admitting: Gastroenterology

## 2020-12-07 DIAGNOSIS — I82493 Acute embolism and thrombosis of other specified deep vein of lower extremity, bilateral: Secondary | ICD-10-CM | POA: Diagnosis not present

## 2020-12-07 DIAGNOSIS — K922 Gastrointestinal hemorrhage, unspecified: Secondary | ICD-10-CM | POA: Diagnosis not present

## 2020-12-07 DIAGNOSIS — C250 Malignant neoplasm of head of pancreas: Secondary | ICD-10-CM | POA: Diagnosis not present

## 2020-12-07 LAB — SURGICAL PATHOLOGY

## 2020-12-07 LAB — COMPREHENSIVE METABOLIC PANEL
ALT: 25 U/L (ref 0–44)
AST: 31 U/L (ref 15–41)
Albumin: 1.8 g/dL — ABNORMAL LOW (ref 3.5–5.0)
Alkaline Phosphatase: 273 U/L — ABNORMAL HIGH (ref 38–126)
Anion gap: 4 — ABNORMAL LOW (ref 5–15)
BUN: 9 mg/dL (ref 8–23)
CO2: 21 mmol/L — ABNORMAL LOW (ref 22–32)
Calcium: 7.2 mg/dL — ABNORMAL LOW (ref 8.9–10.3)
Chloride: 105 mmol/L (ref 98–111)
Creatinine, Ser: 0.31 mg/dL — ABNORMAL LOW (ref 0.44–1.00)
GFR, Estimated: >60 mL/min (ref >60)
Glucose, Bld: 146 mg/dL — ABNORMAL HIGH (ref 70–99)
Potassium: 3.5 mmol/L (ref 3.5–5.1)
Sodium: 130 mmol/L — ABNORMAL LOW (ref 135–145)
Total Bilirubin: 1 mg/dL (ref 0.3–1.2)
Total Protein: 4.9 g/dL — ABNORMAL LOW (ref 6.5–8.1)

## 2020-12-07 LAB — CBC WITH DIFFERENTIAL/PLATELET
Abs Immature Granulocytes: 0.05 10*3/uL (ref 0.00–0.07)
Basophils Absolute: 0 10*3/uL (ref 0.0–0.1)
Basophils Relative: 0 %
Eosinophils Absolute: 0.1 10*3/uL (ref 0.0–0.5)
Eosinophils Relative: 1 %
HCT: 25.1 % — ABNORMAL LOW (ref 36.0–46.0)
Hemoglobin: 8.3 g/dL — ABNORMAL LOW (ref 12.0–15.0)
Immature Granulocytes: 1 %
Lymphocytes Relative: 10 %
Lymphs Abs: 0.8 10*3/uL (ref 0.7–4.0)
MCH: 29.2 pg (ref 26.0–34.0)
MCHC: 33.1 g/dL (ref 30.0–36.0)
MCV: 88.4 fL (ref 80.0–100.0)
Monocytes Absolute: 0.9 10*3/uL (ref 0.1–1.0)
Monocytes Relative: 12 %
Neutro Abs: 5.9 10*3/uL (ref 1.7–7.7)
Neutrophils Relative %: 76 %
Platelets: 109 10*3/uL — ABNORMAL LOW (ref 150–400)
RBC: 2.84 MIL/uL — ABNORMAL LOW (ref 3.87–5.11)
RDW: 14.8 % (ref 11.5–15.5)
WBC: 7.8 10*3/uL (ref 4.0–10.5)
nRBC: 0 % (ref 0.0–0.2)

## 2020-12-07 LAB — GLUCOSE, CAPILLARY
Glucose-Capillary: 209 mg/dL — ABNORMAL HIGH (ref 70–99)
Glucose-Capillary: 217 mg/dL — ABNORMAL HIGH (ref 70–99)
Glucose-Capillary: 257 mg/dL — ABNORMAL HIGH (ref 70–99)
Glucose-Capillary: 167 mg/dL — ABNORMAL HIGH (ref 70–99)

## 2020-12-07 LAB — HEPARIN LEVEL (UNFRACTIONATED): Heparin Unfractionated: <0.1 IU/mL — ABNORMAL LOW (ref 0.30–0.70)

## 2020-12-07 LAB — MAGNESIUM: Magnesium: 1.6 mg/dL — ABNORMAL LOW (ref 1.7–2.4)

## 2020-12-07 MED ORDER — POTASSIUM CHLORIDE CRYS ER 20 MEQ PO TBCR
40.0000 meq | EXTENDED_RELEASE_TABLET | Freq: Once | ORAL | Status: AC
  Administered 2020-12-07: 40 meq via ORAL
  Filled 2020-12-07: qty 2

## 2020-12-07 MED ORDER — HEPARIN (PORCINE) 25000 UT/250ML-% IV SOLN
1400.0000 [IU]/h | INTRAVENOUS | Status: DC
  Administered 2020-12-07: 800 [IU]/h via INTRAVENOUS
  Administered 2020-12-08 – 2020-12-09 (×2): 1400 [IU]/h via INTRAVENOUS
  Filled 2020-12-07 (×3): qty 250

## 2020-12-07 MED ORDER — MAGNESIUM SULFATE 2 GM/50ML IV SOLN
2.0000 g | Freq: Once | INTRAVENOUS | Status: AC
  Administered 2020-12-07: 2 g via INTRAVENOUS
  Filled 2020-12-07: qty 50

## 2020-12-07 NOTE — Progress Notes (Signed)
ANTICOAGULATION CONSULT NOTE - Initial Consult  Pharmacy Consult for IV heparin Indication: DVT  Allergies  Allergen Reactions   Cephalosporins Shortness Of Breath   Erythromycin Shortness Of Breath   Fire Ant Shortness Of Breath   Penicillins Anaphylaxis and Other (See Comments)    "25 years ago"    Patient Measurements: Height: 5\' 3"  (160 cm) Weight: 53.3 kg (117 lb 8.1 oz) IBW/kg (Calculated) : 52.4 Heparin Dosing Weight: 53.3 kg  Vital Signs: Temp: 97.8 F (36.6 C) (10/31 0532) Temp Source: Oral (10/31 0532) BP: 124/66 (10/31 0532) Pulse Rate: 71 (10/31 0532)  Labs: Recent Labs    12/05/20 0436 12/06/20 0307 12/07/20 0347  HGB 8.7* 8.2* 8.3*  HCT 27.1* 25.4* 25.1*  PLT 136* 112* 109*  CREATININE 0.45 0.38* 0.31*    Estimated Creatinine Clearance: 46.4 mL/min (A) (by C-G formula based on SCr of 0.31 mg/dL (L)).   Medical History: Past Medical History:  Diagnosis Date   Diabetes mellitus without complication ([place])    type 2   Dysrhythmia    a-fib 15 yrs ago per patient on 01/17/20   GERD (gastroesophageal reflux disease)    Hypercholesteremia    Hypertension    Pancreatic cancer (HCC)    Pancreatic mass    Pancreatitis    Hx   Seasonal allergies     Medications:  Scheduled:   bisacodyl  10 mg Oral Once   Chlorhexidine Gluconate Cloth  6 each Topical Daily   insulin aspart  0-5 Units Subcutaneous QHS   insulin aspart  0-9 Units Subcutaneous TID WC   pantoprazole  40 mg Oral Daily   potassium chloride  40 mEq Oral Once   sodium chloride flush  10-40 mL Intracatheter Q12H   sodium chloride flush  3 mL Intravenous Q12H   sucralfate  1 g Oral BID   Infusions:   magnesium sulfate bolus IVPB     PRN: acetaminophen **OR** acetaminophen, hydrALAZINE, labetalol, sodium chloride flush, traMADol  Assessment: 80 yo female with GIB, was on Lovenox PTA for new DVT, hx pancreatic cancer. Anticoagulation held for GIB, now to restart as IV UFH on 10/31  with no bolus to see how patient does with bleeding, etc  Goal of Therapy:  Heparin level 0.3-0.7 units/ml Monitor platelets by anticoagulation protocol: Yes   Plan:  NO IV heparin bolus Start IV heparin at rate of 800 units/hr Check heparin level 8 hours after start of IV heparin Daily CBC Monitor signs/symptoms of bleeding closely   Kara Mead 12/07/2020,8:29 AM

## 2020-12-07 NOTE — Progress Notes (Addendum)
[place] for IV heparin Indication: DVT  Allergies  Allergen Reactions   Cephalosporins Shortness Of Breath   Erythromycin Shortness Of Breath   Fire Ant Shortness Of Breath   Penicillins Anaphylaxis and Other (See Comments)    "25 years ago"    Patient Measurements: Height: 5\' 3"  (160 cm) Weight: 53.3 kg (117 lb 8.1 oz) IBW/kg (Calculated) : 52.4 Heparin Dosing Weight: 53.3 kg  Vital Signs: Temp: 98.3 F (36.8 C) (10/31 2011) Temp Source: Oral (10/31 2011) BP: 112/58 (10/31 2011) Pulse Rate: 72 (10/31 2011)  Labs: Recent Labs    12/05/20 0436 12/06/20 0307 12/07/20 0347 12/07/20 1951  HGB 8.7* 8.2* 8.3*  --   HCT 27.1* 25.4* 25.1*  --   PLT 136* 112* 109*  --   HEPARINUNFRC  --   --   --  <0.10*  CREATININE 0.45 0.38* 0.31*  --      Estimated Creatinine Clearance: 46.4 mL/min (A) (by C-G formula based on SCr of 0.31 mg/dL (L)).   Medications:  Scheduled:   bisacodyl  10 mg Oral Once   Chlorhexidine Gluconate Cloth  6 each Topical Daily   insulin aspart  0-5 Units Subcutaneous QHS   insulin aspart  0-9 Units Subcutaneous TID WC   pantoprazole  40 mg Oral Daily   sodium chloride flush  10-40 mL Intracatheter Q12H   sodium chloride flush  3 mL Intravenous Q12H   sucralfate  1 g Oral BID   Infusions:   heparin 800 Units/hr (12/07/20 1057)   PRN: acetaminophen **OR** acetaminophen, hydrALAZINE, labetalol, sodium chloride flush, traMADol  Assessment: 80 yo female with GIB, was on Lovenox PTA for new DVT, hx pancreatic cancer. Anticoagulation held for GIB, now to restart as IV UFH on 10/31 with no bolus to see how patient does with bleeding, etc  Today, 12/07/2020: Hgb low but stable; Plt low and dropping SCr at baseline (low) No new bleeding or infusion issues per RN  Goal of Therapy:  Heparin level 0.3-0.7 units/ml Monitor platelets by anticoagulation protocol: Yes   Plan:  Increase heparin to 1100 units/hr,  no bolus d/t recent bleeding Repeat heparin level in 8 hr Daily CBC Monitor signs/symptoms of bleeding closely   Nancy Hawkins A 12/07/2020,8:59 PM

## 2020-12-07 NOTE — Discharge Instructions (Addendum)
Per Dr. Benay Spice: Keep your appointments on 12/10/20 at [facility]. If schedule allows, we will add your first Gemzar/Abraxane chemotherapy that day. It will be given every 2 weeks. See medication sheets attached. Your Tramadol [facility] Continue your Lovenox 40 mg every 12 hours. **We look forward to seeing you on 12/10/20**

## 2020-12-07 NOTE — Progress Notes (Signed)
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 fib ~15 years ago.  She was last seen by oncology, Nancy Hawkins on 10/20/2020 after having completed SBRT approximately 2 months prior. She then returned on 10/25 for follow-up and was found to have Hgb 6.8 g/dL and was arranged to have blood transfusion (which would take place on 10/26).  She also underwent work-up for lower extremity edema noted on her 10/25 visit with a LE duplex.  This was performed on 12/01/2020 and revealed extensive bilateral lower extremity DVT with age-indeterminate clots.  After risks and benefits were discussed, she decided to start Lovenox.  She had her first dose approximately 1 PM on 12/01/2020 and her second dose around 11 AM on 12/02/2020.  When she presented for blood transfusions on 12/02/2020, she noticed having maroon stools with her bowel movements and mild amount of blood when wiping. Due to this, she was referred for direct admission and GI evaluation given concern for possible involvement from her GI tract related to underlying pancreatic cancer.   Assessment and Plan * GIB (gastrointestinal bleeding) - Lovenox started 10/25: 1st dose around 1 pm and 2nd dose 10/26 ~ 11am - s/p 2 units PRBC on 10/26 - GI consulted, appreciate assistance - CLN: 9 AVMs found, treated with APC and some with clips also - EGD: Gr I/II distal esophageal varices; diffuse moderate inflammation gastric antrum and prepylorus - continue PPI and carafate - trend H/H  DVT of lower extremity, bilateral ([place]) - Underwent work-up for asymmetric lower extremity edema on 12/01/2020 - Duplex shows:     Right: age indeterminate deep vein thrombosis involving the right femoral vein, right popliteal vein, right  peroneal veins, and right posterior tibial veins.    Left: age indeterminate deep vein thrombosis involving the left femoral vein, left popliteal vein, left posterior tibial veins, and left peroneal veins. - Lovenox started 10/25; only had 2 doses prior to admission - discussed with GI and oncology after EGD/CLN on 10/28: plan is to start heparin (no bolus) on Monday 10/31, (if tolerates anticoagulation then possibly Lovenox again); if does have re-bleeding then IVC to be further considered)  Primary cancer of head of pancreas Nancy Hawkins) - diagnosed after EUS Dec 2021 - s/p SBRT - follows with Nancy Hawkins - CT A/P on 10/27 shows new numerous hepatic lesions compatible with metastasis  -Evaluated by oncology during hospitalization as well.  Plan is for gemcitabine/Abraxane as outpatient  History of atrial fibrillation - Distant and remote history per patient, approximately 15 years ago - Not on anticoagulation - On Cardizem at home as part of blood pressure regimen  HTN (hypertension) - BP meds on hold in setting of GI bleed and persistently low/normal blood pressures - Use labetalol or hydralazine as needed  DMII (diabetes mellitus, type 2) (HCC) - last A1c 7.2 % on 03/05/20 - repeat A1c is 6.9% this admission  - SSI and CBG monitoring     Subjective:  No events overnight.  No further maroon bowel movements either.  Denies any significant abdominal pain.  Understands plan is for starting heparin drip today.  Objective Vitals:   12/06/20 0458 12/06/20 1343 12/06/20 2022 12/07/20 0532  BP: (!) 111/54 (!) 105/57 (!) 131/58 124/66  Pulse: 82  80 (!) 104 71  Resp: 16 18 16 16   Temp: 98.7 F (37.1 C) 98.9 F (37.2 C) 98.4 F (36.9 C) 97.8 F (36.6 C)  TempSrc: Oral Oral Oral Oral  SpO2: 98% 100% 100% 98%  Weight:      Height:       53.3 kg  Vital signs were reviewed and unremarkable.   Exam Physical Exam Constitutional:      Appearance: Normal appearance.  HENT:     Head:  Normocephalic and atraumatic.     Mouth/Throat:     Mouth: Mucous membranes are moist.  Eyes:     Extraocular Movements: Extraocular movements intact.  Cardiovascular:     Rate and Rhythm: Normal rate and regular rhythm.  Pulmonary:     Effort: Pulmonary effort is normal.     Breath sounds: Normal breath sounds.  Abdominal:     General: Bowel sounds are normal. There is no distension.     Palpations: Abdomen is soft.     Tenderness: There is no abdominal tenderness.  Musculoskeletal:        General: Normal range of motion.     Cervical back: Normal range of motion and neck supple.  Skin:    General: Skin is warm and dry.  Neurological:     General: No focal deficit present.     Mental Status: She is alert.  Psychiatric:        Mood and Affect: Mood normal.        Behavior: Behavior normal.     Labs / Other Information My review of labs, imaging, notes and other tests is significant for Hgb 8.3 g/dL     Disposition Plan: Status is: Inpatient  Remains inpatient appropriate because: treatment of above      Time spent: Greater than 50% of the 35 minute visit was spent in counseling/coordination of care for the patient as laid out in the A&P.  Nancy Dee, MD [facility] 12/07/2020, 1:33 PM

## 2020-12-07 NOTE — Progress Notes (Addendum)
HEMATOLOGY-ONCOLOGY PROGRESS NOTE  SUBJECTIVE: Nancy Hawkins reports that she is feeling better this morning.  She denies any recurrent bleeding.  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[place])   01/29/2020 Cancer Staging   Staging form: Exocrine Pancreas, AJCC 8th Edition - Clinical: Stage IB (cT2, cN0, cM0) - Signed by Ladell Pier, MD on 01/29/2020    03/23/2020 -  Chemotherapy    Patient is on Treatment Plan: PANCREAS MODIFIED FOLFIRINOX Q14D X 4 CYCLES        PHYSICAL EXAMINATION:  Vitals:   12/06/20 2022 12/07/20 0532  BP: (!) 131/58 124/66  Pulse: (!) 104 71  Resp: 16 16  Temp: 98.4 F (36.9 C) 97.8 F (36.6 C)  SpO2: 100% 98%   Filed Weights   12/02/20 1835  Weight: 53.3 kg    Intake/Output from previous day: 10/30 0701 - 10/31 0700 In: 480 [P.O.:480] Out: -   GENERAL: Chronically ill-appearing female, no distress SKIN: skin color, texture, turgor are normal, no rashes or significant lesions OROPHARYNX:no exudate, no erythema and lips, buccal mucosa, and tongue normal  LUNGS: clear to auscultation and percussion with normal breathing effort HEART: regular rate & rhythm and no murmurs.  Trace edema in the bilateral lower extremities ABDOMEN:abdomen soft, non-tender and normal bowel sounds NEURO: alert & oriented x 3 with fluent speech, no focal motor/sensory deficits  LABORATORY DATA:  I have reviewed the data as listed CMP Latest Ref Rng & Units 12/07/2020 12/06/2020 12/05/2020  Glucose 70 - 99 mg/dL 146(H) 171(H) 141(H)  BUN 8 - 23 mg/dL 9 9 11   Creatinine 0.44 - 1.00 mg/dL 0.31(L) 0.38(L) 0.45  Sodium 135 - 145 mmol/L 130(L) 133(L) 134(L)  Potassium 3.5 - 5.1 mmol/L 3.5 3.9 3.0(L)  Chloride 98 - 111 mmol/L 105 108 109  CO2 22 - 32 mmol/L 21(L) 21(L) 21(L)  Calcium 8.9 - 10.3 mg/dL 7.2(L) 7.1(L) 7.6(L)  Total Protein 6.5 - 8.1 g/dL 4.9(L) 4.9(L) 5.2(L)  Total  Bilirubin 0.3 - 1.2 mg/dL 1.0 1.0 1.0  Alkaline Phos 38 - 126 U/L 273(H) 244(H) 254(H)  AST 15 - 41 U/L 31 33 26  ALT 0 - 44 U/L 25 23 23     Lab Results  Component Value Date   WBC 7.8 12/07/2020   HGB 8.3 (L) 12/07/2020   HCT 25.1 (L) 12/07/2020   MCV 88.4 12/07/2020   PLT 109 (L) 12/07/2020   NEUTROABS 5.9 12/07/2020    CT ABDOMEN PELVIS W CONTRAST  Result Date: 12/03/2020 CLINICAL DATA:  Anemia, central abdominal pain. Known pancreatic cancer. EXAM: CT ABDOMEN AND PELVIS WITH CONTRAST TECHNIQUE: Multidetector CT imaging of the abdomen and pelvis was performed using the standard protocol following bolus administration of intravenous contrast. CONTRAST:  27mL OMNIPAQUE IOHEXOL 350 MG/ML SOLN COMPARISON:  06/15/2020 FINDINGS: Lower chest: Lung bases are clear. No effusions. Heart is normal size. Hepatobiliary: Numerous low-density masses with rim enhancement noted throughout the liver compatible with metastases. The largest is in the right hepatic dome measuring 3.9 cm. Biliary stent noted in the common bile duct with pneumobilia. Diffuse low-density throughout the liver compatible with fatty infiltration. Prior cholecystectomy. Pancreas: In the area of the prior mass in the pancreatic head is a low-density cystic area measuring 1.7 cm. This may reflect necrosis of the previously cyst in pancreatic head mass. Mild biliary ductal dilatation. Spleen: No focal abnormality.  Normal size. Adrenals/Urinary Tract: No hydronephrosis. No  renal or adrenal mass. Urinary bladder unremarkable. Stomach/Bowel: Left colonic diverticulosis. No active diverticulitis. Stomach and small bowel decompressed, unremarkable. Vascular/Lymphatic: Aortic atherosclerosis. No evidence of aneurysm or adenopathy. Reproductive: Prior hysterectomy.  No adnexal masses. Other: Small amount of free fluid in the abdomen and pelvis. No free air. Musculoskeletal: No acute bony abnormality. Stable mild chronic compression fractures in  the lower thoracic spine and upper lumbar spine. IMPRESSION: Numerous rim enhancing lesions throughout the liver measuring up to 4 cm compatible with metastases. These are new since prior study. Hepatic steatosis. Cystic appearance of the mass in the pancreatic head which could be related to necrosis. Biliary stent in place with pneumobilia. Small amount of free fluid in the abdomen or pelvis. Left colonic diverticulosis.  No active diverticulitis. Electronically Signed   By: Rolm Baptise M.D.   On: 12/03/2020 18:27   VAS [place] LOWER EXTREMITY VENOUS (DVT)  Result Date: 12/02/2020  Lower Venous DVT Study Patient Name:  Nancy Hawkins  Date of Exam:   12/01/2020 Medical Rec #: [id number]    Accession #:    [id number] Date of Birth: [birth year]     Patient Gender: F Patient Age:   17 years Exam Location:  [facility] Procedure:      VAS [place] LOWER EXTREMITY VENOUS (DVT) Referring Phys: Ned Card --------------------------------------------------------------------------------  Indications: Bilateral leg swelling and SOB x 3 weeks. Patient takes aspirin daily.  Risk Factors: Cancer Pancreatic. Comparison Study: None Performing Technologist: Alecia Mackin RVT, RDCS (AE), RDMS  Examination Guidelines: A complete evaluation includes B-mode imaging, spectral Doppler, color Doppler, and power Doppler as needed of all accessible portions of each vessel. Bilateral testing is considered an integral part of a complete examination. Limited examinations for reoccurring indications may be performed as noted. The reflux portion of the exam is performed with the patient in reverse Trendelenburg.  +---------+---------------+---------+-----------+---------------+--------------+ RIGHT    CompressibilityPhasicitySpontaneityProperties     Thrombus Aging +---------+---------------+---------+-----------+---------------+--------------+ CFV      Full           Yes      Yes                                       +---------+---------------+---------+-----------+---------------+--------------+ SFJ      Full           Yes      Yes                                      +---------+---------------+---------+-----------+---------------+--------------+ FV Prox  Full           Yes      Yes                                      +---------+---------------+---------+-----------+---------------+--------------+ FV Mid   Full           Yes      Yes                                      +---------+---------------+---------+-----------+---------------+--------------+ FV DistalPartial        Yes      Yes        seen in  Age                                                        duplicating    Indeterminate                                              vein only                     +---------+---------------+---------+-----------+---------------+--------------+ PFV      Full                                                             +---------+---------------+---------+-----------+---------------+--------------+ POP      Partial        Yes      No         with striationsAge                                                                       Indeterminate  +---------+---------------+---------+-----------+---------------+--------------+ PTV      None           No       No         rigid          Age                                                        w/compression  Indeterminate  +---------+---------------+---------+-----------+---------------+--------------+ PERO     None           No       No         rigid          Age                                                        w/compression  Indeterminate  +---------+---------------+---------+-----------+---------------+--------------+ Gastroc  Full                                                             +---------+---------------+---------+-----------+---------------+--------------+ GSV       Full           Yes      Yes                                      +---------+---------------+---------+-----------+---------------+--------------+   +---------+---------------+---------+-----------+---------------+--------------+  LEFT     CompressibilityPhasicitySpontaneityProperties     Thrombus Aging +---------+---------------+---------+-----------+---------------+--------------+ CFV      Full           Yes      Yes                                      +---------+---------------+---------+-----------+---------------+--------------+ SFJ      Full           Yes      Yes                                      +---------+---------------+---------+-----------+---------------+--------------+ FV Prox  Full           Yes      Yes                                      +---------+---------------+---------+-----------+---------------+--------------+ FV Mid   Partial        Yes      No         with striationsAge                                                                       Indeterminate  +---------+---------------+---------+-----------+---------------+--------------+ FV DistalPartial        Yes      No         with striationsAge                                                                       Indeterminate  +---------+---------------+---------+-----------+---------------+--------------+ PFV      Full                                                             +---------+---------------+---------+-----------+---------------+--------------+ POP      Partial        Yes      No         with striationsAge                                                                       Indeterminate  +---------+---------------+---------+-----------+---------------+--------------+ PTV      None           No       No  rigid          Age                                                        w/compression  Indeterminate   +---------+---------------+---------+-----------+---------------+--------------+ PERO     None           No       No         rigid          Age                                                        w/compression  Indeterminate  +---------+---------------+---------+-----------+---------------+--------------+ Gastroc  Full                                                             +---------+---------------+---------+-----------+---------------+--------------+ GSV      Full           Yes      Yes                                      +---------+---------------+---------+-----------+---------------+--------------+    Findings reported to [facility] in [place] 4:05 pm.  Summary: RIGHT: - Findings consistent with age indeterminate deep vein thrombosis involving the right femoral vein, right popliteal vein, right peroneal veins, and right posterior tibial veins.  LEFT: - Findings consistent with age indeterminate deep vein thrombosis involving the left femoral vein, left popliteal vein, left posterior tibial veins, and left peroneal veins.  *See table(s) above for measurements and observations. Electronically signed by Kathlyn Sacramento MD on 12/02/2020 at 9:28:36 AM.    Final     ASSESSMENT AND PLAN: Pancreas cancer, borderline resectable CT abdomen/pelvis-inflammation involving the mid body of the pancreas with peripancreatic fat stranding; possible 2.5 x 2.2 cm low-attenuation mass within the mid body of the pancreas, appears to abut the celiac axis as well as the splenic vein.   MRI 11/18/2019-extensive inflammatory fat stranding about the pancreas with a thinly septated multicystic fluid signal lesion of the anterior pancreatic neck measuring 3.2 x 1.7 cm most consistent with a pancreatic pseudocyst; numerous additional small fluid signal lesions throughout the pancreatic parenchyma, the remaining lesions subcentimeter; central portion of pancreatic duct dilated measuring up to 8  mm without obvious obstructing lesion identified; trace ascites; trace bilateral pleural effusions and associated atelectasis or consolidation.   Colonoscopy 11/20/2019-5 polyps (resected and retrieved- multiple tubular adenomas, negative for high-grade dysplasia); 2 colonic angioectasias; diverticulosis in the entire examined colon; nonbleeding nonthrombosed external and internal hemorrhoids.   EGD/EUS 11/20/2019-few white nummular lesions in the entire esophagus (esophagus biopsy with no significant pathologic findings, no fungal elements); hematin found in the entire examined stomach; gastritis (stomach biopsy showed gastropathy/gastritis); 14 mm semisessile polyp found in the posterior wall of the gastric antrum (hyperplastic polyp); a lesion was identified in  the pancreatic body staged T2 N0 MX.  FNA of the pancreas lesion showed benign reactive/reparative changes, prominent inflammation.   EUS procedure 01/20/2020-masslike region identified in the pancreatic head, stage T2 N0 MX.  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 CTs chest/pelvis 01/29/2020-no evidence of metastatic disease CT abdomen/pelvis 03/05/2020-increase in size of pancreas head/uncinate mass, marked narrowing of the adjacent portal vein, patchy consolidation in the right lower lung, mild intrahepatic and extrahepatic biliary ductal dilatation Cycle 1 FOLFOX 03/23/2020 Cycle 2 FOLFOX 04/06/2020, Emend added for delayed nausea after cycle 1 Cycle 3 FOLFIRINOX 04/20/2020 Cycle 4 FOLFIRINOX 05/04/2020, Udenyca Cycle 5 FOLFIRINOX 05/18/2020, Udenyca Cycle 6 FOLFIRINOX 06/02/2020, Udenyca CTs 06/15/2020- decreased size of pancreatic mass, still with portal vein involvement and extensive upper abdominal collaterals. 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  The upper third of the main bile duct and left and right hepatic ducts and all intrahepatic branches moderately dilated secondary to the stricture.  Biliary sphincterotomy performed.  Covered metal biliary stent placed into the common bile duct. 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 Capo appears stable.  She underwent EGD on 10/22 and was noted to have esophageal varices.  She currently has no signs of bleeding and her hemoglobin is stable.  Recommend initiation of a heparin drip without bolus and if she has no recurrent bleeding, will transition to Lovenox prior to discharge.  Her CT scan showed disease progression in her CA 19.9 has been rising.  We discussed initiation of gemcitabine/Abraxane as outpatient.  Adverse effects have been discussed patient including but not limited to peripheral neuropathy, myelosuppression, nausea and vomiting, alopecia.  The patient would like to proceed with a trial of chemotherapy.  She is aware that she has incurable disease.  She currently has an outpatient follow-up already scheduled in our office on 11/3 and we will try to see if we can administer her first cycle of gemcitabine/Abraxane on that date.  Recommendations: 1.  Begin heparin drip without bolus and transition to Lovenox if no signs of bleeding and hemoglobin remains stable. 2.  Monitor closely for recurrent bleeding 3.  Monitor CBC and transfuse for hemoglobin less than 8 4.  Will plan to initiate gemcitabine/Abraxane as an outpatient.  Anticipate patient will be in  hospital for another 1 to 2 days to be sure that she has.  Future Appointments  Date Time Provider [place]  12/10/2020  8:15 AM [facility] PHLEBOTOMIST [facility] None  12/10/2020  8:45 AM [facility] FLUSH ROOM [facility] None  12/10/2020  9:15 AM Owens Shark, NP [facility] None      LOS: 5 days   Mikey Bussing, DNP, AGPCNP-BC, AOCNP 12/07/20 Ms. Kristiansen was interviewed and examined.  I reviewed the endoscopy findings and communicated with the gastroenterology and medical services on 11/26/2020.  There is no further gross bleeding.  The hemoglobin has been stable for the past few days.  I recommend resuming heparin anticoagulation and transition to Lovenox if there is no bleeding after 24-36 hours of heparin.  I discussed the risk of anticoagulation therapy with Ms.  Uphoff.  She agrees to proceed.  She has progressive metastatic pancreas cancer.  She would like to proceed with gemcitabine/Abraxane chemotherapy.  I reviewed potential toxicities associated with the gemcitabine/Abraxane regimen including the chance of nausea, hematologic toxicity, and alopecia.  We discussed the rash, fever, and pneumonitis associated with gemcitabine.  We discussed the allergic reaction and neuropathy seen with Abraxane.  She agrees to proceed.  The plan is to begin gemcitabine/Abraxane as an outpatient.  I was present for greater than 50% of today's visit.  I performed medical decision making.

## 2020-12-07 NOTE — Discharge Planning (Signed)
Oncology Discharge Planning Note  [facility] Address: [address] [place], [place], [place] [address] Hours of Operation:  Nena Polio, Monday - Friday  Clinic Contact Information:  [phone]) [phone]  Oncology Care Team: Medical Oncologist:  Dr. [name]  Patient Details: Name:  Orpah, Nancy Hawkins MRN:   [id number] DOB:   [birth year] Reason for Current Admission: GIB (gastrointestinal bleeding)  Discharge Planning Narrative: Notification of admission received by Dr. Benay Spice for [name].  Discharge follow-up appointments for oncology are current and available on the AVS and MyChart. Working on getting chemotherapy scheduled for 12/10/20.   Upon discharge from the hospital, hematology/oncology's post discharge plan of care for the outpatient setting is: Begin every 2 week Gemzar and Abraxane on 12/10/20   [name] will be called within two business days after discharge to review hematology/oncology's plan of care for full understanding.    Outpatient Oncology Specific Care Only: Oncology appointment transportation needs addressed?:  not applicable Oncology medication management for symptom management addressed?:  no Chemo Alert Card reviewed?:  no Immunotherapy Alert Card reviewed?:  no These will be reviewed on 12/10/20 office visit.

## 2020-12-07 NOTE — Progress Notes (Signed)
DISCONTINUE ON PATHWAY REGIMEN - Pancreatic Adenocarcinoma     A cycle is every 14 days:     Oxaliplatin      Leucovorin      Irinotecan      Fluorouracil   **Always confirm dose/schedule in your pharmacy ordering system**  REASON: Disease Progression PRIOR TREATMENT: PANOS94: mFOLFIRINOX q14 Days x 4 Cycles TREATMENT RESPONSE: Unable to Evaluate  START ON PATHWAY REGIMEN - Pancreatic Adenocarcinoma     A cycle is every 28 days:     Nab-paclitaxel (protein bound)      Gemcitabine   **Always confirm dose/schedule in your pharmacy ordering system**  Patient Characteristics: Metastatic Disease, Second Line, MSS/pMMR or MSI Unknown, Fluoropyrimidine-Based Therapy First Line Therapeutic Status: Metastatic Disease Line of Therapy: Second Line Microsatellite/Mismatch Repair Status: Unknown Intent of Therapy: Non-Curative / Palliative Intent, Discussed with Patient

## 2020-12-08 ENCOUNTER — Other Ambulatory Visit: Payer: Self-pay | Admitting: *Deleted

## 2020-12-08 ENCOUNTER — Other Ambulatory Visit: Payer: Self-pay | Admitting: Oncology

## 2020-12-08 DIAGNOSIS — C250 Malignant neoplasm of head of pancreas: Secondary | ICD-10-CM | POA: Diagnosis not present

## 2020-12-08 DIAGNOSIS — K922 Gastrointestinal hemorrhage, unspecified: Secondary | ICD-10-CM | POA: Diagnosis not present

## 2020-12-08 DIAGNOSIS — I82493 Acute embolism and thrombosis of other specified deep vein of lower extremity, bilateral: Secondary | ICD-10-CM | POA: Diagnosis not present

## 2020-12-08 LAB — BASIC METABOLIC PANEL
Anion gap: 7 (ref 5–15)
BUN: 8 mg/dL (ref 8–23)
CO2: 20 mmol/L — ABNORMAL LOW (ref 22–32)
Calcium: 7.3 mg/dL — ABNORMAL LOW (ref 8.9–10.3)
Chloride: 103 mmol/L (ref 98–111)
Creatinine, Ser: 0.37 mg/dL — ABNORMAL LOW (ref 0.44–1.00)
GFR, Estimated: >60 mL/min (ref >60)
Glucose, Bld: 188 mg/dL — ABNORMAL HIGH (ref 70–99)
Potassium: 4 mmol/L (ref 3.5–5.1)
Sodium: 130 mmol/L — ABNORMAL LOW (ref 135–145)

## 2020-12-08 LAB — CBC
HCT: 25.1 % — ABNORMAL LOW (ref 36.0–46.0)
Hemoglobin: 8.1 g/dL — ABNORMAL LOW (ref 12.0–15.0)
MCH: 29.1 pg (ref 26.0–34.0)
MCHC: 32.3 g/dL (ref 30.0–36.0)
MCV: 90.3 fL (ref 80.0–100.0)
Platelets: 115 10*3/uL — ABNORMAL LOW (ref 150–400)
RBC: 2.78 MIL/uL — ABNORMAL LOW (ref 3.87–5.11)
RDW: 14.9 % (ref 11.5–15.5)
WBC: 5.9 10*3/uL (ref 4.0–10.5)
nRBC: 0 % (ref 0.0–0.2)

## 2020-12-08 LAB — GLUCOSE, CAPILLARY
Glucose-Capillary: 160 mg/dL — ABNORMAL HIGH (ref 70–99)
Glucose-Capillary: 163 mg/dL — ABNORMAL HIGH (ref 70–99)
Glucose-Capillary: 223 mg/dL — ABNORMAL HIGH (ref 70–99)
Glucose-Capillary: 211 mg/dL — ABNORMAL HIGH (ref 70–99)

## 2020-12-08 LAB — HEPARIN LEVEL (UNFRACTIONATED)
Heparin Unfractionated: 0.1 IU/mL — ABNORMAL LOW (ref 0.30–0.70)
Heparin Unfractionated: 0.33 IU/mL (ref 0.30–0.70)

## 2020-12-08 LAB — MAGNESIUM: Magnesium: 1.8 mg/dL (ref 1.7–2.4)

## 2020-12-08 MED ORDER — HEPARIN SOD (PORK) LOCK FLUSH 100 UNIT/ML IV SOLN
500.0000 [IU] | Freq: Once | INTRAVENOUS | Status: DC
  Filled 2020-12-08: qty 5

## 2020-12-08 MED ORDER — TRAMADOL HCL 50 MG PO TABS: 50.0000 mg | ORAL_TABLET | Freq: Four times a day (QID) | ORAL | 0 refills | Status: AC | PRN

## 2020-12-08 NOTE — Plan of Care (Signed)
  Problem: Activity: Goal: Risk for activity intolerance will decrease Outcome: Progressing   Problem: Nutrition: Goal: Adequate nutrition will be maintained Outcome: Progressing   Problem: Safety: Goal: Ability to remain free from injury will improve Outcome: Progressing   

## 2020-12-08 NOTE — Progress Notes (Signed)
[facility] for IV heparin Indication: DVT  Allergies  Allergen Reactions   Cephalosporins Shortness Of Breath   Erythromycin Shortness Of Breath   Fire Ant Shortness Of Breath   Penicillins Anaphylaxis and Other (See Comments)    "25 years ago"    Patient Measurements: Height: 5\' 3"  (160 cm) Weight: 53.3 kg (117 lb 8.1 oz) IBW/kg (Calculated) : 52.4 Heparin Dosing Weight: 53.3 kg  Vital Signs: Temp: 98.1 F (36.7 C) (11/01 0404) Temp Source: Oral (11/01 0404) BP: 100/46 (11/01 0404) Pulse Rate: 78 (11/01 0404)  Labs: Recent Labs    12/06/20 0307 12/07/20 0347 12/07/20 1951 12/08/20 0606  HGB 8.2* 8.3*  --  8.1*  HCT 25.4* 25.1*  --  25.1*  PLT 112* 109*  --  115*  HEPARINUNFRC  --   --  <0.10* 0.10*  CREATININE 0.38* 0.31*  --  0.37*     Estimated Creatinine Clearance: 46.4 mL/min (A) (by C-G formula based on SCr of 0.37 mg/dL (L)).   Medications:  Scheduled:   bisacodyl  10 mg Oral Once   Chlorhexidine Gluconate Cloth  6 each Topical Daily   insulin aspart  0-5 Units Subcutaneous QHS   insulin aspart  0-9 Units Subcutaneous TID WC   pantoprazole  40 mg Oral Daily   sodium chloride flush  10-40 mL Intracatheter Q12H   sodium chloride flush  3 mL Intravenous Q12H   sucralfate  1 g Oral BID   Infusions:   heparin 1,100 Units/hr (12/08/20 0646)   PRN: acetaminophen **OR** acetaminophen, hydrALAZINE, labetalol, sodium chloride flush, traMADol  Assessment: 80 yo female with GIB, was on Lovenox PTA for new DVT, hx pancreatic cancer. Anticoagulation held for GIB, now to restart as IV UFH on 10/31 with no bolus to see how patient does with bleeding, etc  Today, 12/08/2020: Heparin level SUBtherapeutic  Hgb low but stable; Plt low bu stable SCr at baseline (low) No new bleeding or infusion issues per RN  Goal of Therapy:  Heparin level 0.3-0.7 units/ml Monitor platelets by anticoagulation protocol: Yes   Plan:   Increase heparin from 1100 units/hr to 1400 units/hr, no bolus d/t recent bleeding Repeat heparin level in 8 hr Daily CBC Monitor signs/symptoms of bleeding closely Also noted that plan may perhaps be to change back to Lovenox today and discharge home- will follow up    Adrian Saran, PharmD, BCPS Secure Chat if ?s 12/08/2020 7:13 AM

## 2020-12-08 NOTE — Progress Notes (Signed)
ANTICOAGULATION CONSULT NOTE  Pharmacy Consult for IV heparin Indication: DVT   Patient Measurements: Height: 5\' 3"  (160 cm) Weight: 53.3 kg (117 lb 8.1 oz) IBW/kg (Calculated) : 52.4 Heparin Dosing Weight: 53.3 kg  Vital Signs: Temp: 97.9 F (36.6 C) (11/01 1303) Temp Source: Oral (11/01 1303) BP: 111/54 (11/01 1303) Pulse Rate: 81 (11/01 1303)  Labs: Recent Labs    12/06/20 0307 12/07/20 0347 12/07/20 1951 12/08/20 0606  HGB 8.2* 8.3*  --  8.1*  HCT 25.4* 25.1*  --  25.1*  PLT 112* 109*  --  115*  HEPARINUNFRC  --   --  <0.10* 0.10*  CREATININE 0.38* 0.31*  --  0.37*    Estimated Creatinine Clearance: 46.4 mL/min (A) (by C-G formula based on SCr of 0.37 mg/dL (L)).   Medications: Infusions:   heparin 1,400 Units/hr (12/08/20 1112)     Assessment: 80 yo female with GIB, was on Lovenox PTA for new DVT, hx pancreatic cancer. Anticoagulation held for GIB, now to restart as IV UFH on 10/31 with no bolus to see how patient does with bleeding, etc  Today, 12/08/2020: Heparin level 0.33, increased to therapeutic level No new bleeding or infusion issues per RN  Goal of Therapy:  Heparin level 0.3-0.7 units/ml Monitor platelets by anticoagulation protocol: Yes   Plan:  Continue heparin 1400 units/hr Confirmatory heparin level in 8 hr Daily CBC Monitor signs/symptoms of bleeding closely Follow up potential plan for discharge on Lovenox tomorrow if tolerating heparin drip at therapeutic levels.   Gretta Arab PharmD, BCPS Clinical Pharmacist [facility] main pharmacy [phone] 12/08/2020 4:03 PM

## 2020-12-08 NOTE — Care Management Important Message (Signed)
Important Message  Patient Details IM Letter given to the Patient. Name: Nancy Hawkins MRN: [id number] Date of Birth: Jan 02, 1941   Medicare Important Message Given:  Yes     Kerin Salen 12/08/2020, 2:24 PM

## 2020-12-08 NOTE — Progress Notes (Signed)
HEMATOLOGY-ONCOLOGY PROGRESS NOTE  SUBJECTIVE: Nancy Hawkins denies recurrent bleeding.  She wants to go home.  Heparin was started yesterday morning.  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[place])   01/29/2020 Cancer Staging   Staging form: Exocrine Pancreas, AJCC 8th Edition - Clinical: Stage IB (cT2, cN0, cM0) - Signed by Ladell Pier, MD on 01/29/2020    03/23/2020 - 07/03/2020 Chemotherapy   Patient is on Treatment Plan : PANCREAS Modified FOLFIRINOX q14d x 4 cycles     12/03/2020 -  Chemotherapy   Patient is on Treatment Plan : PANCREATIC Abraxane / Gemcitabine D1,8,15 q28d      PHYSICAL EXAMINATION:  Vitals:   12/08/20 0700 12/08/20 1303  BP: (!) 106/59 (!) 111/54  Pulse: 78 81  Resp: 20 18  Temp: 97.9 F (36.6 C) 97.9 F (36.6 C)  SpO2: 97% 100%   Filed Weights   12/02/20 1835  Weight: 117 lb 8.1 oz (53.3 kg)    Intake/Output from previous day: 10/31 0701 - 11/01 0700 In: 2072.1 [P.O.:1888; I.V.:184.1] Out: -   HEENT: The tongue is dry, no thrush ABDOMEN: Nontender, distended  Vascular: Mild edema of the right lower leg NEURO: alert & oriented x 3 with fluent speech, no focal motor/sensory deficits  LABORATORY DATA:  I have reviewed the data as listed CMP Latest Ref Rng & Units 12/08/2020 12/07/2020 12/06/2020  Glucose 70 - 99 mg/dL 188(H) 146(H) 171(H)  BUN 8 - 23 mg/dL 8 9 9   Creatinine 0.44 - 1.00 mg/dL 0.37(L) 0.31(L) 0.38(L)  Sodium 135 - 145 mmol/L 130(L) 130(L) 133(L)  Potassium 3.5 - 5.1 mmol/L 4.0 3.5 3.9  Chloride 98 - 111 mmol/L 103 105 108  CO2 22 - 32 mmol/L 20(L) 21(L) 21(L)  Calcium 8.9 - 10.3 mg/dL 7.3(L) 7.2(L) 7.1(L)  Total Protein 6.5 - 8.1 g/dL - 4.9(L) 4.9(L)  Total Bilirubin 0.3 - 1.2 mg/dL - 1.0 1.0  Alkaline Phos 38 - 126 U/L - 273(H) 244(H)  AST 15 - 41 U/L - 31 33  ALT 0 - 44 U/L - 25 23    Lab Results  Component Value Date   WBC 5.9  12/08/2020   HGB 8.1 (L) 12/08/2020   HCT 25.1 (L) 12/08/2020   MCV 90.3 12/08/2020   PLT 115 (L) 12/08/2020   NEUTROABS 5.9 12/07/2020    CT ABDOMEN PELVIS W CONTRAST  Result Date: 12/03/2020 CLINICAL DATA:  Anemia, central abdominal pain. Known pancreatic cancer. EXAM: CT ABDOMEN AND PELVIS WITH CONTRAST TECHNIQUE: Multidetector CT imaging of the abdomen and pelvis was performed using the standard protocol following bolus administration of intravenous contrast. CONTRAST:  23mL OMNIPAQUE IOHEXOL 350 MG/ML SOLN COMPARISON:  06/15/2020 FINDINGS: Lower chest: Lung bases are clear. No effusions. Heart is normal size. Hepatobiliary: Numerous low-density masses with rim enhancement noted throughout the liver compatible with metastases. The largest is in the right hepatic dome measuring 3.9 cm. Biliary stent noted in the common bile duct with pneumobilia. Diffuse low-density throughout the liver compatible with fatty infiltration. Prior cholecystectomy. Pancreas: In the area of the prior mass in the pancreatic head is a low-density cystic area measuring 1.7 cm. This may reflect necrosis of the previously cyst in pancreatic head mass. Mild biliary ductal dilatation. Spleen: No focal abnormality.  Normal size. Adrenals/Urinary Tract: No hydronephrosis. No renal or adrenal mass. Urinary bladder unremarkable. Stomach/Bowel: Left colonic diverticulosis. No active diverticulitis. Stomach and small bowel  decompressed, unremarkable. Vascular/Lymphatic: Aortic atherosclerosis. No evidence of aneurysm or adenopathy. Reproductive: Prior hysterectomy.  No adnexal masses. Other: Small amount of free fluid in the abdomen and pelvis. No free air. Musculoskeletal: No acute bony abnormality. Stable mild chronic compression fractures in the lower thoracic spine and upper lumbar spine. IMPRESSION: Numerous rim enhancing lesions throughout the liver measuring up to 4 cm compatible with metastases. These are new since prior study.  Hepatic steatosis. Cystic appearance of the mass in the pancreatic head which could be related to necrosis. Biliary stent in place with pneumobilia. Small amount of free fluid in the abdomen or pelvis. Left colonic diverticulosis.  No active diverticulitis. Electronically Signed   By: Rolm Baptise M.D.   On: 12/03/2020 18:27   VAS [place] LOWER EXTREMITY VENOUS (DVT)  Result Date: 12/02/2020  Lower Venous DVT Study Patient Name:  MARIUM RAGAN  Date of Exam:   12/01/2020 Medical Rec #: [id number]    Accession #:    [id number] Date of Birth: 09/03/1940     Patient Gender: F Patient Age:   80 years Exam Location:  [facility] Procedure:      VAS [place] LOWER EXTREMITY VENOUS (DVT) Referring Phys: Ned Card --------------------------------------------------------------------------------  Indications: Bilateral leg swelling and SOB x 3 weeks. Patient takes aspirin daily.  Risk Factors: Cancer Pancreatic. Comparison Study: None Performing Technologist: Alecia Mackin RVT, RDCS (AE), RDMS  Examination Guidelines: A complete evaluation includes B-mode imaging, spectral Doppler, color Doppler, and power Doppler as needed of all accessible portions of each vessel. Bilateral testing is considered an integral part of a complete examination. Limited examinations for reoccurring indications may be performed as noted. The reflux portion of the exam is performed with the patient in reverse Trendelenburg.  +---------+---------------+---------+-----------+---------------+--------------+ RIGHT    CompressibilityPhasicitySpontaneityProperties     Thrombus Aging +---------+---------------+---------+-----------+---------------+--------------+ CFV      Full           Yes      Yes                                      +---------+---------------+---------+-----------+---------------+--------------+ SFJ      Full           Yes      Yes                                       +---------+---------------+---------+-----------+---------------+--------------+ FV Prox  Full           Yes      Yes                                      +---------+---------------+---------+-----------+---------------+--------------+ FV Mid   Full           Yes      Yes                                      +---------+---------------+---------+-----------+---------------+--------------+ FV DistalPartial        Yes      Yes        seen in        Age  duplicating    Indeterminate                                              vein only                     +---------+---------------+---------+-----------+---------------+--------------+ PFV      Full                                                             +---------+---------------+---------+-----------+---------------+--------------+ POP      Partial        Yes      No         with striationsAge                                                                       Indeterminate  +---------+---------------+---------+-----------+---------------+--------------+ PTV      None           No       No         rigid          Age                                                        w/compression  Indeterminate  +---------+---------------+---------+-----------+---------------+--------------+ PERO     None           No       No         rigid          Age                                                        w/compression  Indeterminate  +---------+---------------+---------+-----------+---------------+--------------+ Gastroc  Full                                                             +---------+---------------+---------+-----------+---------------+--------------+ GSV      Full           Yes      Yes                                      +---------+---------------+---------+-----------+---------------+--------------+    +---------+---------------+---------+-----------+---------------+--------------+ LEFT     CompressibilityPhasicitySpontaneityProperties     Thrombus Aging +---------+---------------+---------+-----------+---------------+--------------+ CFV  Full           Yes      Yes                                      +---------+---------------+---------+-----------+---------------+--------------+ SFJ      Full           Yes      Yes                                      +---------+---------------+---------+-----------+---------------+--------------+ FV Prox  Full           Yes      Yes                                      +---------+---------------+---------+-----------+---------------+--------------+ FV Mid   Partial        Yes      No         with striationsAge                                                                       Indeterminate  +---------+---------------+---------+-----------+---------------+--------------+ FV DistalPartial        Yes      No         with striationsAge                                                                       Indeterminate  +---------+---------------+---------+-----------+---------------+--------------+ PFV      Full                                                             +---------+---------------+---------+-----------+---------------+--------------+ POP      Partial        Yes      No         with striationsAge                                                                       Indeterminate  +---------+---------------+---------+-----------+---------------+--------------+ PTV      None           No       No         rigid          Age  w/compression  Indeterminate  +---------+---------------+---------+-----------+---------------+--------------+ PERO     None           No       No         rigid          Age                                                         w/compression  Indeterminate  +---------+---------------+---------+-----------+---------------+--------------+ Gastroc  Full                                                             +---------+---------------+---------+-----------+---------------+--------------+ GSV      Full           Yes      Yes                                      +---------+---------------+---------+-----------+---------------+--------------+    Findings reported to [facility] in [place] 4:05 pm.  Summary: RIGHT: - Findings consistent with age indeterminate deep vein thrombosis involving the right femoral vein, right popliteal vein, right peroneal veins, and right posterior tibial veins.  LEFT: - Findings consistent with age indeterminate deep vein thrombosis involving the left femoral vein, left popliteal vein, left posterior tibial veins, and left peroneal veins.  *See table(s) above for measurements and observations. Electronically signed by Kathlyn Sacramento MD on 12/02/2020 at 9:28:36 AM.    Final     ASSESSMENT AND PLAN: Pancreas cancer, borderline resectable CT abdomen/pelvis-inflammation involving the mid body of the pancreas with peripancreatic fat stranding; possible 2.5 x 2.2 cm low-attenuation mass within the mid body of the pancreas, appears to abut the celiac axis as well as the splenic vein.   MRI 11/18/2019-extensive inflammatory fat stranding about the pancreas with a thinly septated multicystic fluid signal lesion of the anterior pancreatic neck measuring 3.2 x 1.7 cm most consistent with a pancreatic pseudocyst; numerous additional small fluid signal lesions throughout the pancreatic parenchyma, the remaining lesions subcentimeter; central portion of pancreatic duct dilated measuring up to 8 mm without obvious obstructing lesion identified; trace ascites; trace bilateral pleural effusions and associated atelectasis or consolidation.   Colonoscopy  11/20/2019-5 polyps (resected and retrieved- multiple tubular adenomas, negative for high-grade dysplasia); 2 colonic angioectasias; diverticulosis in the entire examined colon; nonbleeding nonthrombosed external and internal hemorrhoids.   EGD/EUS 11/20/2019-few white nummular lesions in the entire esophagus (esophagus biopsy with no significant pathologic findings, no fungal elements); hematin found in the entire examined stomach; gastritis (stomach biopsy showed gastropathy/gastritis); 14 mm semisessile polyp found in the posterior wall of the gastric antrum (hyperplastic polyp); a lesion was identified in the pancreatic body staged T2 N0 MX.  FNA of the pancreas lesion showed benign reactive/reparative changes, prominent inflammation.   EUS procedure 01/20/2020-masslike region identified in the pancreatic head, stage T2 N0 MX.  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 CTs chest/pelvis 01/29/2020-no evidence of metastatic disease CT abdomen/pelvis 03/05/2020-increase in size of pancreas head/uncinate mass, marked narrowing of  the adjacent portal vein, patchy consolidation in the right lower lung, mild intrahepatic and extrahepatic biliary ductal dilatation Cycle 1 FOLFOX 03/23/2020 Cycle 2 FOLFOX 04/06/2020, Emend added for delayed nausea after cycle 1 Cycle 3 FOLFIRINOX 04/20/2020 Cycle 4 FOLFIRINOX 05/04/2020, Udenyca Cycle 5 FOLFIRINOX 05/18/2020, Udenyca Cycle 6 FOLFIRINOX 06/02/2020, Udenyca CTs 06/15/2020- decreased size of pancreatic mass, still with portal vein involvement and extensive upper abdominal collaterals. Cycle 7 FOLFIRINOX 06/16/2020, Udenyca Cycle 8 FOLFIRINOX 07/01/2020, oxaliplatin held secondary to neuropathy, Udenyca SBRT 08/12/2020 - 08/21/2020, 5 fractions Pancreatitis October 2021 Diabetes October 2021 Isolated episode of atrial fibrillation approximately 15 years ago Hypertension COVID-19 infection Admission 03/05/2020 with  biliary obstruction-ERCP 03/09/2020, biliary obstruction secondary to known pancreatic head mass found in the lower third of the main duct.  The upper third of the main bile duct and left and right hepatic ducts and all intrahepatic branches moderately dilated secondary to the stricture.  Biliary sphincterotomy performed.  Covered metal biliary stent placed into the common bile duct. Port-A-Cath placement 03/11/2020, Dr. [name] Oxaliplatin neuropathy Hospital admission 12/02/2020-GI bleed Bilateral lower extremity DVTs 12/01/2020-Lovenox Lovenox held on hospital admission 12/02/2020 Heparin 12/07/2020 Esophageal varices  Ms. Asencio appears stable.  The hemoglobin is stable.  No report of recurrent bleeding.  I recommend resuming Lovenox today.  She would like to return to the cancer center as scheduled on 12/10/2020 with the plan to begin gemcitabine/Abraxane. Recommendations: 1.  Resume Lovenox, 40 mg twice daily-patient should have home supply 2.  Follow-up 12/10/2020 as scheduled for an office visit and gemcitabine/Abraxane chemotherapy 3.  Continue tramadol as needed for pain-we will refill the tramadol 4.  She is stable for discharge from an oncology standpoint  Future Appointments  Date Time Provider [place]  12/10/2020  8:15 AM [facility] PHLEBOTOMIST [facility] None  12/10/2020  8:45 AM [facility] FLUSH ROOM [facility] None  12/10/2020  9:15 AM Owens Shark, NP [facility] None  12/11/2020  8:30 AM [facility] CHAIR 1 [facility] None      LOS: 6 days   [name], MD 12/08/20

## 2020-12-08 NOTE — Progress Notes (Signed)
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 fib ~15 years ago.  She was last seen by oncology, Dr. Benay Spice on 10/20/2020 after having completed SBRT approximately 2 months prior. She then returned on 10/25 for follow-up and was found to have Hgb 6.8 g/dL and was arranged to have blood transfusion (which would take place on 10/26).  She also underwent work-up for lower extremity edema noted on her 10/25 visit with a LE duplex.  This was performed on 12/01/2020 and revealed extensive bilateral lower extremity DVT with age-indeterminate clots.  After risks and benefits were discussed, she decided to start Lovenox.  She had her first dose approximately 1 PM on 12/01/2020 and her second dose around 11 AM on 12/02/2020.  When she presented for blood transfusions on 12/02/2020, she noticed having maroon stools with her bowel movements and mild amount of blood when wiping. Due to this, she was referred for direct admission and GI evaluation given concern for possible involvement from her GI tract related to underlying pancreatic cancer.   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    Left: age indeterminate deep vein thrombosis involving the left femoral vein, left popliteal vein, left posterior tibial veins, and left peroneal veins. - Lovenox started 10/25; only had 2 doses prior to admission - discussed with GI and oncology after EGD/CLN on 10/28: plan is to start heparin (no bolus) on Monday 10/31, (if tolerates anticoagulation then possibly Lovenox again); if does have re-bleeding then IVC to be further considered) -Tolerating heparin drip, however subtherapeutic heparin levels, therefore will continue on only heparin drip today with target of therapeutic level, if no signs of bleeding then transition to Lovenox tomorrow and discharging home.  Has outpatient follow-up on Thursday with oncology  Primary cancer of head of pancreas [facility]) - diagnosed after EUS Dec 2021 - s/p SBRT - follows with Dr. Benay Spice - CT A/P on 10/27 shows new numerous hepatic lesions compatible with metastasis  -Evaluated by oncology during hospitalization as well.  Plan is for gemcitabine/Abraxane as outpatient  History of atrial fibrillation - Distant and remote history per patient, approximately 15 years ago - Not on anticoagulation - On Cardizem at home as part of blood pressure regimen - BP has remained low/normal and BP meds have been on hold; might not require full resumption of home meds at discharge  HTN (hypertension) - BP meds on hold in setting of GI bleed and persistently low/normal blood pressures - Use labetalol or hydralazine as needed - see afib  DMII (diabetes mellitus, type 2) ([facility]) - last A1c 7.2 % on 03/05/20 - repeat A1c  is 6.9% this admission  - SSI and CBG monitoring     Subjective:  No events overnight. Stools remain normal, no further signs of bleeding; discussed 1 more night to get heparin to therapeutic level and then see how she tolerates; if still okay then transition to Lovenox tomorrow and home. She is  agreeable with plan.   Objective Vitals:   12/07/20 2011 12/08/20 0404 12/08/20 0700 12/08/20 1303  BP: (!) 112/58 (!) 100/46 (!) 106/59 (!) 111/54  Pulse: 72 78 78 81  Resp: 20 16 20 18   Temp: 98.3 F (36.8 C) 98.1 F (36.7 C) 97.9 F (36.6 C) 97.9 F (36.6 C)  TempSrc: Oral Oral Oral Oral  SpO2: 100% 98% 97% 100%  Weight:      Height:       53.3 kg  Vital signs were reviewed and unremarkable.   Exam Physical Exam Constitutional:      Appearance: Normal appearance.  HENT:     Head: Normocephalic and atraumatic.     Mouth/Throat:     Mouth: Mucous membranes are moist.  Eyes:     Extraocular Movements: Extraocular movements intact.  Cardiovascular:     Rate and Rhythm: Normal rate and regular rhythm.  Pulmonary:     Effort: Pulmonary effort is normal.     Breath sounds: Normal breath sounds.  Abdominal:     General: Bowel sounds are normal. There is no distension.     Palpations: Abdomen is soft.     Tenderness: There is no abdominal tenderness.  Musculoskeletal:        General: Normal range of motion.     Cervical back: Normal range of motion and neck supple.  Skin:    General: Skin is warm and dry.  Neurological:     General: No focal deficit present.     Mental Status: She is alert.  Psychiatric:        Mood and Affect: Mood normal.        Behavior: Behavior normal.     Labs / Other Information My review of labs, imaging, notes and other tests is significant for Hgb 8.1 g/dL     Disposition Plan: Status is: Inpatient  Remains inpatient appropriate because: treatment of above      Time spent: Greater than 50% of the 35 minute visit was spent in counseling/coordination of care for the patient as laid out in the A&P.  Dwyane Dee, MD [facility] 12/08/2020, 1:32 PM

## 2020-12-08 NOTE — Telephone Encounter (Signed)
Refill for Tramadol called to [facility] at Dr. Gearldine Shown request.

## 2020-12-09 DIAGNOSIS — I82493 Acute embolism and thrombosis of other specified deep vein of lower extremity, bilateral: Secondary | ICD-10-CM | POA: Diagnosis not present

## 2020-12-09 DIAGNOSIS — C250 Malignant neoplasm of head of pancreas: Secondary | ICD-10-CM | POA: Diagnosis not present

## 2020-12-09 DIAGNOSIS — K31811 Angiodysplasia of stomach and duodenum with bleeding: Secondary | ICD-10-CM | POA: Diagnosis not present

## 2020-12-09 LAB — BASIC METABOLIC PANEL
Anion gap: 6 (ref 5–15)
BUN: 8 mg/dL (ref 8–23)
CO2: 22 mmol/L (ref 22–32)
Calcium: 7.5 mg/dL — ABNORMAL LOW (ref 8.9–10.3)
Chloride: 104 mmol/L (ref 98–111)
Creatinine, Ser: 0.32 mg/dL — ABNORMAL LOW (ref 0.44–1.00)
GFR, Estimated: >60 mL/min (ref >60)
Glucose, Bld: 145 mg/dL — ABNORMAL HIGH (ref 70–99)
Potassium: 3.7 mmol/L (ref 3.5–5.1)
Sodium: 132 mmol/L — ABNORMAL LOW (ref 135–145)

## 2020-12-09 LAB — CBC
HCT: 25.3 % — ABNORMAL LOW (ref 36.0–46.0)
Hemoglobin: 8.1 g/dL — ABNORMAL LOW (ref 12.0–15.0)
MCH: 28.4 pg (ref 26.0–34.0)
MCHC: 32 g/dL (ref 30.0–36.0)
MCV: 88.8 fL (ref 80.0–100.0)
Platelets: 132 10*3/uL — ABNORMAL LOW (ref 150–400)
RBC: 2.85 MIL/uL — ABNORMAL LOW (ref 3.87–5.11)
RDW: 15 % (ref 11.5–15.5)
WBC: 6 10*3/uL (ref 4.0–10.5)
nRBC: 0 % (ref 0.0–0.2)

## 2020-12-09 LAB — HEPARIN LEVEL (UNFRACTIONATED): Heparin Unfractionated: 0.36 IU/mL (ref 0.30–0.70)

## 2020-12-09 LAB — MAGNESIUM: Magnesium: 1.9 mg/dL (ref 1.7–2.4)

## 2020-12-09 LAB — GLUCOSE, CAPILLARY: Glucose-Capillary: 115 mg/dL — ABNORMAL HIGH (ref 70–99)

## 2020-12-09 MED ORDER — ENOXAPARIN SODIUM 40 MG/0.4ML IJ SOSY
40.0000 mg | PREFILLED_SYRINGE | Freq: Two times a day (BID) | INTRAMUSCULAR | Status: DC
  Administered 2020-12-09: 40 mg via SUBCUTANEOUS
  Filled 2020-12-09: qty 0.4

## 2020-12-09 MED ORDER — HEPARIN SOD (PORK) LOCK FLUSH 100 UNIT/ML IV SOLN
500.0000 [IU] | INTRAVENOUS | Status: AC | PRN
  Administered 2020-12-09: 500 [IU]
  Filled 2020-12-09: qty 5

## 2020-12-09 MED ORDER — SUCRALFATE 1 GM/10ML PO SUSP: 1.0000 g | Freq: Two times a day (BID) | ORAL | 0 refills | Status: DC

## 2020-12-09 MED ORDER — PANTOPRAZOLE SODIUM 40 MG PO TBEC: 40.0000 mg | DELAYED_RELEASE_TABLET | Freq: Every day | ORAL | 0 refills | Status: AC

## 2020-12-09 NOTE — Progress Notes (Signed)
ANTICOAGULATION CONSULT NOTE  Pharmacy Consult for IV heparin Indication: DVT   Patient Measurements: Height: 5\' 3"  (160 cm) Weight: 53.3 kg (117 lb 8.1 oz) IBW/kg (Calculated) : 52.4 Heparin Dosing Weight: 53.3 kg  Vital Signs: Temp: 97.8 F (36.6 C) (11/01 1933) Temp Source: Oral (11/01 1933) BP: 131/63 (11/01 1933) Pulse Rate: 75 (11/01 1933)  Labs: Recent Labs    12/06/20 0307 12/07/20 0347 12/07/20 1951 12/08/20 0606 12/08/20 1544 12/09/20 0059  HGB 8.2* 8.3*  --  8.1*  --   --   HCT 25.4* 25.1*  --  25.1*  --   --   PLT 112* 109*  --  115*  --   --   HEPARINUNFRC  --   --    < > 0.10* 0.33 0.36  CREATININE 0.38* 0.31*  --  0.37*  --   --    < > = values in this interval not displayed.    Estimated Creatinine Clearance: 46.4 mL/min (A) (by C-G formula based on SCr of 0.37 mg/dL (L)).   Medications: Infusions:   heparin 1,400 Units/hr (12/08/20 1112)     Assessment: 80 yo female with GIB, was on Lovenox PTA for new DVT, hx pancreatic cancer. Anticoagulation held for GIB, now to restart as IV UFH on 10/31 with no bolus to see how patient does with bleeding, etc  Today, 12/09/2020: Heparin level 0.36, therapeutic on IV heparin @ 41ml/hr No new bleeding or infusion issues per RN  Goal of Therapy:  Heparin level 0.3-0.7 units/ml Monitor platelets by anticoagulation protocol: Yes   Plan:  Continue heparin 1400 units/hr Daily CBC & heparin level  Monitor signs/symptoms of bleeding closely Follow up potential plan for discharge on Lovenox tomorrow if tolerating heparin drip at therapeutic levels.   Netta Cedars PharmD, BCPS Clinical Pharmacist [facility] main pharmacy [phone] 12/09/2020 1:34 AM

## 2020-12-09 NOTE — Progress Notes (Signed)
   12/09/20 1200  Mobility  Activity Refused mobility  Mobility Ambulated independently in room   Pt refused mobility today. Stated "the bathroom and I are attached right now". Hold mobility.   [place] Specialist Acute Rehab Services Office: [phone]

## 2020-12-09 NOTE — Plan of Care (Signed)

## 2020-12-09 NOTE — Discharge Summary (Signed)
DISCHARGE SUMMARY  Nancy Hawkins  MR#: [id number]  DOB:Oct 27, 1940  Date of Admission: 12/02/2020 Date of Discharge: 12/09/2020  Attending Physician:Gyselle Matthew Hennie Duos, MD  Patient's KGM:WNUU, Alexis Goodell., MD  Consults: Oncology GI  Disposition: D/C home    Follow-up Appts:  Follow-up Information     Ladell Pier, MD Follow up.   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 Fib HTN DM2  Initial presentation: 80yo with a history of pancreatic adenocarcinoma diagnosed 2021, DM2, HTN, and an isolated atrial fibrillation episode who was seen by her Oncologist 10/25 and found to have a hemoglobin of 6.8.  At the time she also complained of lower extremity edema and a Doppler noted extensive bilateral lower extremity DVTs, for which Lovenox was initiated.  On 10/26 she reported maroon-colored stools and was directly admitted.  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 Benay Spice in Oncology -CT 10/27  noted numerous hepatic lesions compatible with metastatic disease -has a follow-up appointment scheduled 12/10/2020   Remote history of transient A. Fib Does not require long-term medical therapy   HTN Medical therapy has been on hold in setting of GI bleeding and persistently low blood pressures - f/u as outpt  - cont to hold BP meds at time of d/c   DM2 A1c 6.9 - CBG well controlled during this admit   Allergies as of 12/09/2020       Reactions   Cephalosporins Shortness Of Breath   Erythromycin Shortness Of Breath   Fire Ant Shortness Of Breath   Penicillins Anaphylaxis, Other (See Comments)   "25 years ago"        Medication List     STOP taking these medications    diltiazem 120 MG 24 hr capsule Commonly known as: TIAZAC   famotidine 40 MG tablet Commonly known as: PEPCID   Klor-Con M20 20 MEQ tablet Generic drug: potassium chloride SA   telmisartan 40 MG tablet Commonly known as: MICARDIS       TAKE these medications    Accu-Chek Guide test strip Generic drug: glucose blood   Basaglar KwikPen 100 UNIT/ML Inject 12 Units into the skin daily. What changed:  how much to take when to take this additional instructions   enoxaparin 40 MG/0.4ML injection Commonly known as: LOVENOX Inject 0.4 mLs (40 mg total) into the skin every 12 (twelve) hours.   Ensure Max Protein Liqd Take 330 mLs (11 oz total) by mouth 2 (two) times daily with a meal. What changed:  when  to take this reasons to take this   fexofenadine 180 MG tablet Commonly known as: ALLEGRA Take 180 mg by mouth in the morning.   guaiFENesin 600 MG 12 hr tablet Commonly known as: MUCINEX Take 600 mg by mouth daily.   insulin aspart 100 UNIT/ML FlexPen Commonly known as: NOVOLOG Inject 3 Units into the skin 3 (three) times daily with meals. What changed:  how much to take when to take this additional instructions   lidocaine-prilocaine cream Commonly known as: EMLA Apply 1  application topically as needed. Apply over port 45 minutes prior to having port accessed for chemotherapy treatment.   lipase/protease/amylase 36000 UNITS Cpep capsule Commonly known as: Creon Take 1 capsule (36,000 Units total) by mouth 3 (three) times daily before meals. TAKE 1 CAPSULE (36,000 UNITS TOTAL) BY MOUTH 3 (THREE) TIMES DAILY BEFORE MEALS.   OCUVITE EYE HEALTH FORMULA PO Take 1 tablet by mouth daily.   pantoprazole 40 MG tablet Commonly known as: PROTONIX Take 1 tablet (40 mg total) by mouth daily. Start taking on: December 10, 2020   Probiotic Daily Caps Take 1 capsule by mouth daily.   sucralfate 1 GM/10ML suspension Commonly known as: CARAFATE Take 10 mLs (1 g total) by mouth 2 (two) times daily.   traMADol 50 MG tablet Commonly known as: ULTRAM Take 1 tablet (50 mg total) by mouth every 6 (six) hours as needed for moderate pain.   triamcinolone 55 MCG/ACT Aero nasal inhaler Commonly known as: NASACORT Place 2 sprays into the nose daily as needed (for congestion).   Vitamin B-12 2500 MCG Subl Place 2,500 mcg under the tongue daily.   Vitamin D3 25 MCG (1000 UT) Caps Take 1,000 Units by mouth daily.        Day of Discharge BP (!) 120/52 (BP Location: Left Arm)   Pulse 74   Temp 98.5 F (36.9 C) (Oral)   Resp 18   Ht 5\' 3"  (1.6 m)   Wt 53.3 kg   LMP  (LMP Unknown)   SpO2 98%   BMI 20.82 kg/m   Physical Exam: General: No acute respiratory distress Lungs: Clear to auscultation bilaterally without wheezes or crackles Cardiovascular: Regular rate and rhythm without murmur gallop or rub normal S1 and S2 Abdomen: Nontender, nondistended, soft, bowel sounds positive, no rebound, no ascites, no appreciable mass Extremities: No significant cyanosis, clubbing, or edema bilateral lower extremities  Basic Metabolic Panel: Recent Labs  Lab 12/05/20 0436 12/06/20 0307 12/07/20 0347 12/08/20 0606 12/09/20 0423  NA 134* 133* 130* 130* 132*  K 3.0* 3.9  3.5 4.0 3.7  CL 109 108 105 103 104  CO2 21* 21* 21* 20* 22  GLUCOSE 141* 171* 146* 188* 145*  BUN 11 9 9 8 8   CREATININE 0.45 0.38* 0.31* 0.37* 0.32*  CALCIUM 7.6* 7.1* 7.2* 7.3* 7.5*  MG 1.7 1.8 1.6* 1.8 1.9    Liver Function Tests: Recent Labs  Lab 12/03/20 0355 12/04/20 0031 12/05/20 0436 12/06/20 0307 12/07/20 0347  AST 26 34 26 33 31  ALT 23 26 23 23 25   ALKPHOS 277* 318* 254* 244* 273*  BILITOT 1.4* 1.1 1.0 1.0 1.0  PROT 5.5* 5.9* 5.2* 4.9* 4.9*  ALBUMIN 2.2* 2.3* 2.0* 1.9* 1.8*    CBC: Recent Labs  Lab 12/03/20 0355 12/03/20 1625 12/04/20 0031 12/05/20 0436 12/06/20 0307 12/07/20 0347 12/08/20 0606 12/09/20 0423  WBC 6.4  --  11.6* 10.6* 8.3 7.8 5.9 6.0  NEUTROABS 4.8  --  9.5* 8.9*  6.5 5.9  --   --   HGB 8.8*   < > 9.8* 8.7* 8.2* 8.3* 8.1* 8.1*  HCT 26.7*   < > 30.3* 27.1* 25.4* 25.1* 25.1* 25.3*  MCV 87.0  --  88.6 88.6 89.1 88.4 90.3 88.8  PLT 136*  --  191 136* 112* 109* 115* 132*   < > = values in this interval not displayed.    CBG: Recent Labs  Lab 12/08/20 0740 12/08/20 1215 12/08/20 1654 12/08/20 2138 12/09/20 0804  GLUCAP 160* 163* 223* 211* 115*    Recent Results (from the past 240 hour(s))  Resp Panel by RT-PCR (Flu A&B, Covid) Nasopharyngeal Swab     Status: None   Collection Time: 12/02/20 10:57 PM   Specimen: Nasopharyngeal Swab; Nasopharyngeal(NP) swabs in vial transport medium  Result Value Ref Range Status   SARS Coronavirus 2 by RT PCR NEGATIVE NEGATIVE Final    Comment: (NOTE) SARS-CoV-2 target nucleic acids are NOT DETECTED.  The SARS-CoV-2 RNA is generally detectable in upper respiratory specimens during the acute phase of infection. The lowest concentration of SARS-CoV-2 viral copies this assay can detect is 138 copies/mL. A negative result does not preclude SARS-Cov-2 infection and should not be used as the sole basis for treatment or other patient management decisions. A negative result may occur with  improper  specimen collection/handling, submission of specimen other than nasopharyngeal swab, presence of viral mutation(s) within the areas targeted by this assay, and inadequate number of viral copies(<138 copies/mL). A negative result must be combined with clinical observations, patient history, and epidemiological information. The expected result is Negative.  Fact Sheet for Patients:  EntrepreneurPulse.com.au  Fact Sheet for Healthcare Providers:  IncredibleEmployment.be  This test is no t yet approved or cleared by the [place] FDA and  has been authorized for detection and/or diagnosis of SARS-CoV-2 by FDA under an Emergency Use Authorization (EUA). This EUA will remain  in effect (meaning this test can be used) for the duration of the COVID-19 declaration under Section 564(b)(1) of the Act, 21 U.S.C.section 360bbb-3(b)(1), unless the authorization is terminated  or revoked sooner.       Influenza A by PCR NEGATIVE NEGATIVE Final   Influenza B by PCR NEGATIVE NEGATIVE Final    Comment: (NOTE) The Xpert Xpress SARS-CoV-2/FLU/RSV plus assay is intended as an aid in the diagnosis of influenza from Nasopharyngeal swab specimens and should not be used as a sole basis for treatment. Nasal washings and aspirates are unacceptable for Xpert Xpress SARS-CoV-2/FLU/RSV testing.  Fact Sheet for Patients: EntrepreneurPulse.com.au  Fact Sheet for Healthcare Providers: IncredibleEmployment.be  This test is not yet approved or cleared by the [place] FDA and has been authorized for detection and/or diagnosis of SARS-CoV-2 by FDA under an Emergency Use Authorization (EUA). This EUA will remain in effect (meaning this test can be used) for the duration of the COVID-19 declaration under Section 564(b)(1) of the Act, 21 U.S.C. section 360bbb-3(b)(1), unless the authorization is terminated or revoked.  Performed at  [facility], [place] [address], [place], [place] [address]       Time spent in discharge (includes decision making & examination of pt): 35 minutes  12/09/2020, 11:42 AM   Cherene Altes, MD [facility] Office  [phone]

## 2020-12-10 ENCOUNTER — Inpatient Hospital Stay: Payer: Medicare Other

## 2020-12-10 ENCOUNTER — Encounter: Payer: Self-pay | Admitting: Oncology

## 2020-12-10 ENCOUNTER — Other Ambulatory Visit: Payer: Self-pay

## 2020-12-10 ENCOUNTER — Inpatient Hospital Stay: Payer: Medicare Other | Attending: Nurse Practitioner | Admitting: Nurse Practitioner

## 2020-12-10 ENCOUNTER — Encounter: Payer: Self-pay | Admitting: Nurse Practitioner

## 2020-12-10 VITALS — BP 115/55 | HR 92 | Temp 97.6°F | Resp 18 | Wt 135.2 lb

## 2020-12-10 DIAGNOSIS — R410 Disorientation, unspecified: Secondary | ICD-10-CM | POA: Diagnosis not present

## 2020-12-10 DIAGNOSIS — M7989 Other specified soft tissue disorders: Secondary | ICD-10-CM | POA: Diagnosis not present

## 2020-12-10 DIAGNOSIS — Z86718 Personal history of other venous thrombosis and embolism: Secondary | ICD-10-CM | POA: Diagnosis not present

## 2020-12-10 DIAGNOSIS — Z8616 Personal history of COVID-19: Secondary | ICD-10-CM | POA: Insufficient documentation

## 2020-12-10 DIAGNOSIS — G620 Drug-induced polyneuropathy: Secondary | ICD-10-CM | POA: Diagnosis not present

## 2020-12-10 DIAGNOSIS — T451X5A Adverse effect of antineoplastic and immunosuppressive drugs, initial encounter: Secondary | ICD-10-CM | POA: Insufficient documentation

## 2020-12-10 DIAGNOSIS — R609 Edema, unspecified: Secondary | ICD-10-CM | POA: Insufficient documentation

## 2020-12-10 DIAGNOSIS — E8809 Other disorders of plasma-protein metabolism, not elsewhere classified: Secondary | ICD-10-CM | POA: Diagnosis not present

## 2020-12-10 DIAGNOSIS — I824Y3 Acute embolism and thrombosis of unspecified deep veins of proximal lower extremity, bilateral: Secondary | ICD-10-CM

## 2020-12-10 DIAGNOSIS — I10 Essential (primary) hypertension: Secondary | ICD-10-CM | POA: Insufficient documentation

## 2020-12-10 DIAGNOSIS — R6883 Chills (without fever): Secondary | ICD-10-CM | POA: Diagnosis not present

## 2020-12-10 DIAGNOSIS — R140 Abdominal distension (gaseous): Secondary | ICD-10-CM | POA: Insufficient documentation

## 2020-12-10 DIAGNOSIS — Z7901 Long term (current) use of anticoagulants: Secondary | ICD-10-CM | POA: Diagnosis not present

## 2020-12-10 DIAGNOSIS — Z8719 Personal history of other diseases of the digestive system: Secondary | ICD-10-CM | POA: Insufficient documentation

## 2020-12-10 DIAGNOSIS — C250 Malignant neoplasm of head of pancreas: Secondary | ICD-10-CM

## 2020-12-10 DIAGNOSIS — K644 Residual hemorrhoidal skin tags: Secondary | ICD-10-CM | POA: Insufficient documentation

## 2020-12-10 DIAGNOSIS — C251 Malignant neoplasm of body of pancreas: Secondary | ICD-10-CM | POA: Diagnosis present

## 2020-12-10 DIAGNOSIS — I82403 Acute embolism and thrombosis of unspecified deep veins of lower extremity, bilateral: Secondary | ICD-10-CM | POA: Diagnosis not present

## 2020-12-10 DIAGNOSIS — I8500 Esophageal varices without bleeding: Secondary | ICD-10-CM | POA: Insufficient documentation

## 2020-12-10 MED ORDER — HEPARIN SOD (PORK) LOCK FLUSH 100 UNIT/ML IV SOLN
500.0000 [IU] | Freq: Once | INTRAVENOUS | Status: AC | PRN
  Administered 2020-12-10: 500 [IU]

## 2020-12-10 MED ORDER — SODIUM CHLORIDE 0.9 % IV SOLN: Freq: Once | INTRAVENOUS | Status: AC

## 2020-12-10 MED ORDER — PACLITAXEL PROTEIN-BOUND CHEMO INJECTION 100 MG
100.0000 mg/m2 | Freq: Once | INTRAVENOUS | Status: AC
  Administered 2020-12-10: 150 mg via INTRAVENOUS
  Filled 2020-12-10: qty 30

## 2020-12-10 MED ORDER — SODIUM CHLORIDE 0.9 % IV SOLN
800.0000 mg/m2 | Freq: Once | INTRAVENOUS | Status: AC
  Administered 2020-12-10: 1216 mg via INTRAVENOUS
  Filled 2020-12-10: qty 10.52

## 2020-12-10 MED ORDER — PROCHLORPERAZINE MALEATE 10 MG PO TABS
10.0000 mg | ORAL_TABLET | Freq: Once | ORAL | Status: AC
  Administered 2020-12-10: 10 mg via ORAL
  Filled 2020-12-10: qty 1

## 2020-12-10 MED ORDER — SODIUM CHLORIDE 0.9% FLUSH
10.0000 mL | INTRAVENOUS | Status: DC | PRN
  Administered 2020-12-10: 10 mL

## 2020-12-10 NOTE — Progress Notes (Signed)
Pharmacist Chemotherapy Monitoring - Initial Assessment    Anticipated start date: 12/10/20   The following has been reviewed per standard work regarding the patient's treatment regimen: The patient's diagnosis, treatment plan and drug doses, and organ/hematologic function Lab orders and baseline tests specific to treatment regimen  The treatment plan start date, drug sequencing, and pre-medications Prior authorization status  Patient's documented medication list, including drug-drug interaction screen and prescriptions for anti-emetics and supportive care specific to the treatment regimen The drug concentrations, fluid compatibility, administration routes, and timing of the medications to be used The patient's access for treatment and lifetime cumulative dose history, if applicable  The patient's medication allergies and previous infusion related reactions, if applicable   Changes made to treatment plan:  N/A  Follow up needed:  N/A   Patrica Duel, RPH, 12/10/2020  9:03 AM

## 2020-12-10 NOTE — Progress Notes (Signed)
Pt recently released from hospital with ~20 lb weight gain due to fluid accumulation. MD ok to treat with baseline weight.

## 2020-12-10 NOTE — Patient Instructions (Signed)
Implanted Port Home Guide An implanted port is a device that is placed under the skin. It is usually placed in the chest. The device can be used to give IV medicine, to take blood, or for dialysis. You may have an implanted port if: You need IV medicine that would be irritating to the small veins in your hands or arms. You need IV medicines, such as antibiotics, for a long period of time. You need IV nutrition for a long period of time. You need dialysis. When you have a port, your health care provider can choose to use the port instead of veins in your arms for these procedures. You may have fewer limitations when using a port than you would if you used other types of long-term IVs, and you will likely be able to return to normal activities after your incision heals. An implanted port has two main parts: Reservoir. The reservoir is the part where a needle is inserted to give medicines or draw blood. The reservoir is round. After it is placed, it appears as a small, raised area under your skin. Catheter. The catheter is a thin, flexible tube that connects the reservoir to a vein. Medicine that is inserted into the reservoir goes into the catheter and then into the vein. How is my port accessed? To access your port: A numbing cream may be placed on the skin over the port site. Your health care provider will put on a mask and sterile gloves. The skin over your port will be cleaned carefully with a germ-killing soap and allowed to dry. Your health care provider will gently pinch the port and insert a needle into it. Your health care provider will check for a blood return to make sure the port is in the vein and is not clogged. If your port needs to remain accessed to get medicine continuously (constant infusion), your health care provider will place a clear bandage (dressing) over the needle site. The dressing and needle will need to be changed every week, or as told by your health care provider. What  is flushing? Flushing helps keep the port from getting clogged. Follow instructions from your health care provider about how and when to flush the port. Ports are usually flushed with saline solution or a medicine called heparin. The need for flushing will depend on how the port is used: If the port is only used from time to time to give medicines or draw blood, the port may need to be flushed: Before and after medicines have been given. Before and after blood has been drawn. As part of routine maintenance. Flushing may be recommended every 4-6 weeks. If a constant infusion is running, the port may not need to be flushed. Throw away any syringes in a disposal container that is meant for sharp items (sharps container). You can buy a sharps container from a pharmacy, or you can make one by using an empty hard plastic bottle with a cover. How long will my port stay implanted? The port can stay in for as long as your health care provider thinks it is needed. When it is time for the port to come out, a surgery will be done to remove it. The surgery will be similar to the procedure that was done to put the port in. Follow these instructions at home:  Flush your port as told by your health care provider. If you need an infusion over several days, follow instructions from your health care provider about how   to take care of your port site. Make sure you: Wash your hands with soap and water before you change your dressing. If soap and water are not available, use alcohol-based hand sanitizer. Change your dressing as told by your health care provider. Place any used dressings or infusion bags into a plastic bag. Throw that bag in the trash. Keep the dressing that covers the needle clean and dry. Do not get it wet. Do not use scissors or sharp objects near the tube. Keep the tube clamped, unless it is being used. Check your port site every day for signs of infection. Check for: Redness, swelling, or  pain. Fluid or blood. Pus or a bad smell. Protect the skin around the port site. Avoid wearing bra straps that rub or irritate the site. Protect the skin around your port from seat belts. Place a soft pad over your chest if needed. Bathe or shower as told by your health care provider. The site may get wet as long as you are not actively receiving an infusion. Return to your normal activities as told by your health care provider. Ask your health care provider what activities are safe for you. Carry a medical alert card or wear a medical alert bracelet at all times. This will let health care providers know that you have an implanted port in case of an emergency. Get help right away if: You have redness, swelling, or pain at the port site. You have fluid or blood coming from your port site. You have pus or a bad smell coming from the port site. You have a fever. Summary Implanted ports are usually placed in the chest for long-term IV access. Follow instructions from your health care provider about flushing the port and changing bandages (dressings). Take care of the area around your port by avoiding clothing that puts pressure on the area, and by watching for signs of infection. Protect the skin around your port from seat belts. Place a soft pad over your chest if needed. Get help right away if you have a fever or you have redness, swelling, pain, drainage, or a bad smell at the port site. This information is not intended to replace advice given to you by your health care provider. Make sure you discuss any questions you have with your health care provider. Document Revised: 04/15/2020 Document Reviewed: 06/10/2019 Elsevier Patient Education  2022 Elsevier Inc.  

## 2020-12-10 NOTE — Patient Instructions (Signed)
Nancy Hawkins  Discharge Instructions: Thank you for choosing [facility] to provide your oncology and hematology care.   If you have a lab appointment with the [facility], please go directly to the [facility] and check in at the registration area.   Wear comfortable clothing and clothing appropriate for easy access to any Portacath or PICC line.   We strive to give you quality time with your provider. You may need to reschedule your appointment if you arrive late (15 or more minutes).  Arriving late affects you and other patients whose appointments are after yours.  Also, if you miss three or more appointments without notifying the office, you may be dismissed from the clinic at the provider's discretion.      For prescription refill requests, have your pharmacy contact our office and allow 72 hours for refills to be completed.    Today you received the following chemotherapy and/or immunotherapy agents Gemcitabine, Paclitaxel-albumin.       To help prevent nausea and vomiting after your treatment, we encourage you to take your nausea medication as directed.  BELOW ARE SYMPTOMS THAT SHOULD BE REPORTED IMMEDIATELY: *FEVER GREATER THAN 100.4 F (38 C) OR HIGHER *CHILLS OR SWEATING *NAUSEA AND VOMITING THAT IS NOT CONTROLLED WITH YOUR NAUSEA MEDICATION *UNUSUAL SHORTNESS OF BREATH *UNUSUAL BRUISING OR BLEEDING *URINARY PROBLEMS (pain or burning when urinating, or frequent urination) *BOWEL PROBLEMS (unusual diarrhea, constipation, pain near the anus) TENDERNESS IN MOUTH AND THROAT WITH OR WITHOUT PRESENCE OF ULCERS (sore throat, sores in mouth, or a toothache) UNUSUAL RASH, SWELLING OR PAIN  UNUSUAL VAGINAL DISCHARGE OR ITCHING   Items with * indicate a potential emergency and should be followed up as soon as possible or go to the Emergency Department if any problems should occur.  Please show the CHEMOTHERAPY ALERT CARD or IMMUNOTHERAPY ALERT  CARD at check-in to the Emergency Department and triage nurse.  Should you have questions after your visit or need to cancel or reschedule your appointment, please contact [facility]  Dept: [phone]  and follow the prompts.  Office hours are 8:00 a.m. to 4:30 p.m. Monday - Friday. Please note that voicemails left after 4:00 p.m. may not be returned until the following business day.  We are closed weekends and major holidays. You have access to a nurse at all times for urgent questions. Please call the main number to the clinic Dept: [phone] and follow the prompts.   For any non-urgent questions, you may also contact your provider using MyChart. We now offer e-Visits for anyone 37 and older to request care online for non-urgent symptoms. For details visit [email or web address].   Also download the MyChart app! Go to the app store, search "MyChart", open the app, select [facility], and log in with your MyChart username and password.  Due to Covid, a mask is required upon entering the hospital/clinic. If you do not have a mask, one will be given to you upon arrival. For doctor visits, patients may have 1 support person aged 53 or older with them. For treatment visits, patients cannot have anyone with them due to current Covid guidelines and our immunocompromised population.   Gemcitabine injection What is this medication? GEMCITABINE (jem SYE ta been) is a chemotherapy drug. This medicine is used to treat many types of cancer like breast cancer, lung cancer, pancreatic cancer, and ovarian cancer. This medicine may be used for other purposes; ask your health care  provider or pharmacist if you have questions. COMMON BRAND NAME(S): Gemzar, Infugem What should I tell my care team before I take this medication? They need to know if you have any of these conditions: blood disorders infection kidney disease liver disease lung or breathing disease, like  asthma recent or ongoing radiation therapy an unusual or allergic reaction to gemcitabine, other chemotherapy, other medicines, foods, dyes, or preservatives pregnant or trying to get pregnant breast-feeding How should I use this medication? This drug is given as an infusion into a vein. It is administered in a hospital or clinic by a specially trained health care professional. Talk to your pediatrician regarding the use of this medicine in children. Special care may be needed. Overdosage: If you think you have taken too much of this medicine contact a poison control center or emergency room at once. NOTE: This medicine is only for you. Do not share this medicine with others. What if I miss a dose? It is important not to miss your dose. Call your doctor or health care professional if you are unable to keep an appointment. What may interact with this medication? medicines to increase blood counts like filgrastim, pegfilgrastim, sargramostim some other chemotherapy drugs like cisplatin vaccines Talk to your doctor or health care professional before taking any of these medicines: acetaminophen aspirin ibuprofen ketoprofen naproxen This list may not describe all possible interactions. Give your health care provider a list of all the medicines, herbs, non-prescription drugs, or dietary supplements you use. Also tell them if you smoke, drink alcohol, or use illegal drugs. Some items may interact with your medicine. What should I watch for while using this medication? Visit your doctor for checks on your progress. This drug may make you feel generally unwell. This is not uncommon, as chemotherapy can affect healthy cells as well as cancer cells. Report any side effects. Continue your course of treatment even though you feel ill unless your doctor tells you to stop. In some cases, you may be given additional medicines to help with side effects. Follow all directions for their use. Call your doctor  or health care professional for advice if you get a fever, chills or sore throat, or other symptoms of a cold or flu. Do not treat yourself. This drug decreases your body's ability to fight infections. Try to avoid being around people who are sick. This medicine may increase your risk to bruise or bleed. Call your doctor or health care professional if you notice any unusual bleeding. Be careful brushing and flossing your teeth or using a toothpick because you may get an infection or bleed more easily. If you have any dental work done, tell your dentist you are receiving this medicine. Avoid taking products that contain aspirin, acetaminophen, ibuprofen, naproxen, or ketoprofen unless instructed by your doctor. These medicines may hide a fever. Do not become pregnant while taking this medicine or for 6 months after stopping it. Women should inform their doctor if they wish to become pregnant or think they might be pregnant. Men should not father a child while taking this medicine and for 3 months after stopping it. There is a potential for serious side effects to an unborn child. Talk to your health care professional or pharmacist for more information. Do not breast-feed an infant while taking this medicine or for at least 1 week after stopping it. Men should inform their doctors if they wish to father a child. This medicine may lower sperm counts. Talk with your doctor or  health care professional if you are concerned about your fertility. What side effects may I notice from receiving this medication? Side effects that you should report to your doctor or health care professional as soon as possible: allergic reactions like skin rash, itching or hives, swelling of the face, lips, or tongue breathing problems pain, redness, or irritation at site where injected signs and symptoms of a dangerous change in heartbeat or heart rhythm like chest pain; dizziness; fast or irregular heartbeat; palpitations; feeling  faint or lightheaded, falls; breathing problems signs of decreased platelets or bleeding - bruising, pinpoint red spots on the skin, black, tarry stools, blood in the urine signs of decreased red blood cells - unusually weak or tired, feeling faint or lightheaded, falls signs of infection - fever or chills, cough, sore throat, pain or difficulty passing urine signs and symptoms of kidney injury like trouble passing urine or change in the amount of urine signs and symptoms of liver injury like dark yellow or brown urine; general ill feeling or flu-like symptoms; light-colored stools; loss of appetite; nausea; right upper belly pain; unusually weak or tired; yellowing of the eyes or skin swelling of ankles, feet, hands Side effects that usually do not require medical attention (report to your doctor or health care professional if they continue or are bothersome): constipation diarrhea hair loss loss of appetite nausea rash vomiting This list may not describe all possible side effects. Call your doctor for medical advice about side effects. You may report side effects to FDA at 1-800-FDA-1088. Where should I keep my medication? This drug is given in a hospital or clinic and will not be stored at home. NOTE: This sheet is a summary. It may not cover all possible information. If you have questions about this medicine, talk to your doctor, pharmacist, or health care provider.  2022 Elsevier/Gold Standard (2017-04-19 18:06:11)  Nanoparticle Albumin-Bound Paclitaxel injection What is this medication? NANOPARTICLE ALBUMIN-BOUND PACLITAXEL (Na no PAHR ti kuhl al BYOO muhn-bound PAK li TAX el) is a chemotherapy drug. It targets fast dividing cells, like cancer cells, and causes these cells to die. This medicine is used to treat advanced breast cancer, lung cancer, and pancreatic cancer. This medicine may be used for other purposes; ask your health care provider or pharmacist if you have  questions. COMMON BRAND NAME(S): Abraxane What should I tell my care team before I take this medication? They need to know if you have any of these conditions: kidney disease liver disease low blood counts, like low white cell, platelet, or red cell counts lung or breathing disease, like asthma tingling of the fingers or toes, or other nerve disorder an unusual or allergic reaction to paclitaxel, albumin, other chemotherapy, other medicines, foods, dyes, or preservatives pregnant or trying to get pregnant breast-feeding How should I use this medication? This drug is given as an infusion into a vein. It is administered in a hospital or clinic by a specially trained health care professional. Talk to your pediatrician regarding the use of this medicine in children. Special care may be needed. Overdosage: If you think you have taken too much of this medicine contact a poison control center or emergency room at once. NOTE: This medicine is only for you. Do not share this medicine with others. What if I miss a dose? It is important not to miss your dose. Call your doctor or health care professional if you are unable to keep an appointment. What may interact with this medication? This medicine may  interact with the following medications: antiviral medicines for hepatitis, HIV or AIDS certain antibiotics like erythromycin and clarithromycin certain medicines for fungal infections like ketoconazole and itraconazole certain medicines for seizures like carbamazepine, phenobarbital, phenytoin gemfibrozil nefazodone rifampin St. John's wort This list may not describe all possible interactions. Give your health care provider a list of all the medicines, herbs, non-prescription drugs, or dietary supplements you use. Also tell them if you smoke, drink alcohol, or use illegal drugs. Some items may interact with your medicine. What should I watch for while using this medication? Your condition will be  monitored carefully while you are receiving this medicine. You will need important blood work done while you are taking this medicine. This medicine can cause serious allergic reactions. If you experience allergic reactions like skin rash, itching or hives, swelling of the face, lips, or tongue, tell your doctor or health care professional right away. In some cases, you may be given additional medicines to help with side effects. Follow all directions for their use. This drug may make you feel generally unwell. This is not uncommon, as chemotherapy can affect healthy cells as well as cancer cells. Report any side effects. Continue your course of treatment even though you feel ill unless your doctor tells you to stop. Call your doctor or health care professional for advice if you get a fever, chills or sore throat, or other symptoms of a cold or flu. Do not treat yourself. This drug decreases your body's ability to fight infections. Try to avoid being around people who are sick. This medicine may increase your risk to bruise or bleed. Call your doctor or health care professional if you notice any unusual bleeding. Be careful brushing and flossing your teeth or using a toothpick because you may get an infection or bleed more easily. If you have any dental work done, tell your dentist you are receiving this medicine. Avoid taking products that contain aspirin, acetaminophen, ibuprofen, naproxen, or ketoprofen unless instructed by your doctor. These medicines may hide a fever. Do not become pregnant while taking this medicine or for 6 months after stopping it. Women should inform their doctor if they wish to become pregnant or think they might be pregnant. Men should not father a child while taking this medicine or for 3 months after stopping it. There is a potential for serious side effects to an unborn child. Talk to your health care professional or pharmacist for more information. Do not breast-feed an  infant while taking this medicine or for 2 weeks after stopping it. This medicine may interfere with the ability to get pregnant or to father a child. You should talk to your doctor or health care professional if you are concerned about your fertility. What side effects may I notice from receiving this medication? Side effects that you should report to your doctor or health care professional as soon as possible: allergic reactions like skin rash, itching or hives, swelling of the face, lips, or tongue breathing problems changes in vision fast, irregular heartbeat low blood pressure mouth sores pain, tingling, numbness in the hands or feet signs of decreased platelets or bleeding - bruising, pinpoint red spots on the skin, black, tarry stools, blood in the urine signs of decreased red blood cells - unusually weak or tired, feeling faint or lightheaded, falls signs of infection - fever or chills, cough, sore throat, pain or difficulty passing urine signs and symptoms of liver injury like dark yellow or brown urine; general ill feeling  or flu-like symptoms; light-colored stools; loss of appetite; nausea; right upper belly pain; unusually weak or tired; yellowing of the eyes or skin swelling of the ankles, feet, hands unusually slow heartbeat Side effects that usually do not require medical attention (report to your doctor or health care professional if they continue or are bothersome): diarrhea hair loss loss of appetite nausea, vomiting tiredness This list may not describe all possible side effects. Call your doctor for medical advice about side effects. You may report side effects to FDA at 1-800-FDA-1088. Where should I keep my medication? This drug is given in a hospital or clinic and will not be stored at home. NOTE: This sheet is a summary. It may not cover all possible information. If you have questions about this medicine, talk to your doctor, pharmacist, or health care provider.   2022 Elsevier/Gold Standard (2016-09-27 13:03:45)

## 2020-12-10 NOTE — Progress Notes (Signed)
Patient presents for treatment. RN assessment completed along with the following:  Labs/vitals reviewed - Yes, and within treatment parameters.  Per Dr. Benay Spice, ok to treat with CBC from 12/09/20 and CMP from 12/07/20 Weight within 10% of previous measurement - No, provider and pharmacy notified of weight change. Oncology Treatment Attestation completed for current therapy- Yes, on date 12/07/20 Informed consent completed and reflects current therapy/intent - Yes, on date 12/10/20             Provider progress note reviewed - Yes, today's provider note was reviewed. Treatment/Antibody/Supportive plan reviewed - Yes, and there are no adjustments needed for today's treatment. S&H and other orders reviewed - Yes, and there are no additional orders identified. Previous treatment date reviewed - Yes, and the appropriate amount of time has elapsed between treatments. Clinic Hand Off Received from - Ned Card, NP  Patient to proceed with treatment.

## 2020-12-10 NOTE — Progress Notes (Signed)
[facility] OFFICE PROGRESS NOTE   Diagnosis: Pancreas cancer  INTERVAL HISTORY:   [name] returns as scheduled.  She was discharged from the hospital yesterday.  She is seen today prior to proceeding with cycle 1 gemcitabine/Abraxane.  She notes abdominal distention and leg swelling.  She is having frequent formed stools.  She denies bleeding.  No nausea or vomiting.  Objective:  Vital signs in last 24 hours:  Blood pressure (!) 115/55, pulse 92, temperature 97.6 F (36.4 C), temperature source Temporal, resp. rate 18, weight 135 lb 3.2 oz (61.3 kg), SpO2 100 %.    HEENT: No thrush or ulcers. Resp: Lungs clear bilaterally. Cardio: Regular rate and rhythm. GI: Abdomen is distended, soft.  Mild generalized tenderness. Vascular: Pitting edema throughout both legs. Neuro: Alert and oriented. Port-A-Cath without erythema.  Lab Results:  Lab Results  Component Value Date   WBC 6.0 12/09/2020   HGB 8.1 (L) 12/09/2020   HCT 25.3 (L) 12/09/2020   MCV 88.8 12/09/2020   PLT 132 (L) 12/09/2020   NEUTROABS 5.9 12/07/2020    Imaging:  No results found.  Medications: I have reviewed the patient's current medications.  Assessment/Plan: Pancreas cancer, borderline resectable CT abdomen/pelvis-inflammation involving the mid body of the pancreas with peripancreatic fat stranding; possible 2.5 x 2.2 cm low-attenuation mass within the mid body of the pancreas, appears to abut the celiac axis as well as the splenic vein.   MRI 11/18/2019-extensive inflammatory fat stranding about the pancreas with a thinly septated multicystic fluid signal lesion of the anterior pancreatic neck measuring 3.2 x 1.7 cm most consistent with a pancreatic pseudocyst; numerous additional small fluid signal lesions throughout the pancreatic parenchyma, the remaining lesions subcentimeter; central portion of pancreatic duct dilated measuring up to 8 mm without obvious obstructing lesion  identified; trace ascites; trace bilateral pleural effusions and associated atelectasis or consolidation.   Colonoscopy 11/20/2019-5 polyps (resected and retrieved- multiple tubular adenomas, negative for high-grade dysplasia); 2 colonic angioectasias; diverticulosis in the entire examined colon; nonbleeding nonthrombosed external and internal hemorrhoids.   EGD/EUS 11/20/2019-few white nummular lesions in the entire esophagus (esophagus biopsy with no significant pathologic findings, no fungal elements); hematin found in the entire examined stomach; gastritis (stomach biopsy showed gastropathy/gastritis); 14 mm semisessile polyp found in the posterior wall of the gastric antrum (hyperplastic polyp); a lesion was identified in the pancreatic body staged T2 N0 MX.  FNA of the pancreas lesion showed benign reactive/reparative changes, prominent inflammation.   EUS procedure 01/20/2020-masslike region identified in the pancreatic head, stage T2 N0 MX.  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 CTs chest/pelvis 01/29/2020-no evidence of metastatic disease CT abdomen/pelvis 03/05/2020-increase in size of pancreas head/uncinate mass, marked narrowing of the adjacent portal vein, patchy consolidation in the right lower lung, mild intrahepatic and extrahepatic biliary ductal dilatation Cycle 1 FOLFOX 03/23/2020 Cycle 2 FOLFOX 04/06/2020, Emend added for delayed nausea after cycle 1 Cycle 3 FOLFIRINOX 04/20/2020 Cycle 4 FOLFIRINOX 05/04/2020, Udenyca Cycle 5 FOLFIRINOX 05/18/2020, Udenyca Cycle 6 FOLFIRINOX 06/02/2020, Udenyca CTs 06/15/2020- decreased size of pancreatic mass, still with portal vein involvement and extensive upper abdominal collaterals. 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  The upper third of the main bile duct and left and right hepatic ducts and all intrahepatic branches moderately dilated secondary to the stricture.  Biliary sphincterotomy performed.  Covered metal biliary stent placed into the common bile duct. 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 appears stable.  She was discharged from the hospital yesterday following admission for GI bleed, need for anticoagulation due to recent diagnosis bilateral lower extremity DVTs.  She has resumed Lovenox.  She denies recurrent bleeding.  She is scheduled to begin treatment today with gemcitabine/Abraxane.  Potential toxicities again reviewed.  She agrees to proceed.  CBC and chemistry panel/basic metabolic panel from the recent hospitalization reviewed.  Labs adequate to proceed as above.  Weight is up significantly due to edema.  The edema is likely due to a combination of DVTs, hypoalbuminemia, fluids received in the hospital.  She will elevate and wear compression stockings, work on nutrition.  She will return for lab, follow-up, cycle 2 gemcitabine/Abraxane in 2 weeks.  She will contact the office in the interim with any problems.  We specifically discussed signs/symptoms suggestive of progressive anemia, bleeding.    Ned Card ANP/GNP-BC   12/10/2020  9:02 AM

## 2020-12-11 ENCOUNTER — Telehealth: Payer: Self-pay

## 2020-12-11 ENCOUNTER — Ambulatory Visit: Payer: Medicare Other

## 2020-12-11 NOTE — Telephone Encounter (Signed)
Called patient for first time chemo follow-up.  Patient states she "felt some chills" last evening, but is feeling better today.  Patient denied nausea and states she has been able to drink a protein shake today.  Instructed patient to call office with any questions or concerns.  Patient verbalized understanding.

## 2020-12-13 ENCOUNTER — Encounter: Payer: Self-pay | Admitting: Oncology

## 2020-12-14 ENCOUNTER — Other Ambulatory Visit: Payer: Self-pay

## 2020-12-14 ENCOUNTER — Inpatient Hospital Stay (HOSPITAL_COMMUNITY)
Admission: EM | Admit: 2020-12-14 | Discharge: 2020-12-22 | DRG: 070 | Disposition: A | Discharge status: Hospice | Payer: Medicare Other | Source: Ambulatory Visit | Attending: Internal Medicine | Admitting: Internal Medicine

## 2020-12-14 ENCOUNTER — Encounter (HOSPITAL_COMMUNITY): Payer: Self-pay | Admitting: Family Medicine

## 2020-12-14 ENCOUNTER — Inpatient Hospital Stay: Payer: Medicare Other

## 2020-12-14 ENCOUNTER — Emergency Department (HOSPITAL_COMMUNITY): Payer: Medicare Other

## 2020-12-14 ENCOUNTER — Telehealth: Payer: Self-pay

## 2020-12-14 ENCOUNTER — Inpatient Hospital Stay (HOSPITAL_BASED_OUTPATIENT_CLINIC_OR_DEPARTMENT_OTHER): Payer: Medicare Other | Admitting: Nurse Practitioner

## 2020-12-14 VITALS — BP 92/66 | HR 80 | Temp 98.1°F | Resp 18 | Ht 63.0 in

## 2020-12-14 DIAGNOSIS — E860 Dehydration: Secondary | ICD-10-CM | POA: Diagnosis present

## 2020-12-14 DIAGNOSIS — R6510 Systemic inflammatory response syndrome (SIRS) of non-infectious origin without acute organ dysfunction: Secondary | ICD-10-CM | POA: Diagnosis not present

## 2020-12-14 DIAGNOSIS — N179 Acute kidney failure, unspecified: Secondary | ICD-10-CM | POA: Diagnosis present

## 2020-12-14 DIAGNOSIS — R54 Age-related physical debility: Secondary | ICD-10-CM | POA: Diagnosis present

## 2020-12-14 DIAGNOSIS — C787 Secondary malignant neoplasm of liver and intrahepatic bile duct: Secondary | ICD-10-CM | POA: Diagnosis present

## 2020-12-14 DIAGNOSIS — I82443 Acute embolism and thrombosis of tibial vein, bilateral: Secondary | ICD-10-CM | POA: Diagnosis present

## 2020-12-14 DIAGNOSIS — I480 Paroxysmal atrial fibrillation: Secondary | ICD-10-CM

## 2020-12-14 DIAGNOSIS — E119 Type 2 diabetes mellitus without complications: Secondary | ICD-10-CM | POA: Diagnosis present

## 2020-12-14 DIAGNOSIS — K219 Gastro-esophageal reflux disease without esophagitis: Secondary | ICD-10-CM | POA: Diagnosis present

## 2020-12-14 DIAGNOSIS — I82493 Acute embolism and thrombosis of other specified deep vein of lower extremity, bilateral: Secondary | ICD-10-CM | POA: Diagnosis not present

## 2020-12-14 DIAGNOSIS — Z9049 Acquired absence of other specified parts of digestive tract: Secondary | ICD-10-CM

## 2020-12-14 DIAGNOSIS — Z66 Do not resuscitate: Secondary | ICD-10-CM | POA: Diagnosis present

## 2020-12-14 DIAGNOSIS — I82433 Acute embolism and thrombosis of popliteal vein, bilateral: Secondary | ICD-10-CM | POA: Diagnosis present

## 2020-12-14 DIAGNOSIS — E871 Hypo-osmolality and hyponatremia: Secondary | ICD-10-CM

## 2020-12-14 DIAGNOSIS — I9589 Other hypotension: Secondary | ICD-10-CM | POA: Diagnosis not present

## 2020-12-14 DIAGNOSIS — C250 Malignant neoplasm of head of pancreas: Secondary | ICD-10-CM

## 2020-12-14 DIAGNOSIS — Z7982 Long term (current) use of aspirin: Secondary | ICD-10-CM

## 2020-12-14 DIAGNOSIS — I82403 Acute embolism and thrombosis of unspecified deep veins of lower extremity, bilateral: Secondary | ICD-10-CM | POA: Diagnosis present

## 2020-12-14 DIAGNOSIS — J9811 Atelectasis: Secondary | ICD-10-CM | POA: Diagnosis present

## 2020-12-14 DIAGNOSIS — Z6823 Body mass index (BMI) 23.0-23.9, adult: Secondary | ICD-10-CM

## 2020-12-14 DIAGNOSIS — Z881 Allergy status to other antibiotic agents status: Secondary | ICD-10-CM

## 2020-12-14 DIAGNOSIS — I82453 Acute embolism and thrombosis of peroneal vein, bilateral: Secondary | ICD-10-CM | POA: Diagnosis present

## 2020-12-14 DIAGNOSIS — Z7901 Long term (current) use of anticoagulants: Secondary | ICD-10-CM

## 2020-12-14 DIAGNOSIS — Z515 Encounter for palliative care: Secondary | ICD-10-CM

## 2020-12-14 DIAGNOSIS — D6481 Anemia due to antineoplastic chemotherapy: Secondary | ICD-10-CM | POA: Diagnosis present

## 2020-12-14 DIAGNOSIS — E43 Unspecified severe protein-calorie malnutrition: Secondary | ICD-10-CM | POA: Insufficient documentation

## 2020-12-14 DIAGNOSIS — Z20822 Contact with and (suspected) exposure to covid-19: Secondary | ICD-10-CM | POA: Diagnosis present

## 2020-12-14 DIAGNOSIS — D638 Anemia in other chronic diseases classified elsewhere: Secondary | ICD-10-CM | POA: Diagnosis present

## 2020-12-14 DIAGNOSIS — Z880 Allergy status to penicillin: Secondary | ICD-10-CM

## 2020-12-14 DIAGNOSIS — Z86718 Personal history of other venous thrombosis and embolism: Secondary | ICD-10-CM

## 2020-12-14 DIAGNOSIS — E8809 Other disorders of plasma-protein metabolism, not elsewhere classified: Secondary | ICD-10-CM | POA: Diagnosis present

## 2020-12-14 DIAGNOSIS — D6959 Other secondary thrombocytopenia: Secondary | ICD-10-CM | POA: Diagnosis not present

## 2020-12-14 DIAGNOSIS — E861 Hypovolemia: Secondary | ICD-10-CM | POA: Diagnosis present

## 2020-12-14 DIAGNOSIS — G9341 Metabolic encephalopathy: Principal | ICD-10-CM | POA: Diagnosis present

## 2020-12-14 DIAGNOSIS — R609 Edema, unspecified: Secondary | ICD-10-CM

## 2020-12-14 DIAGNOSIS — R601 Generalized edema: Secondary | ICD-10-CM | POA: Diagnosis present

## 2020-12-14 DIAGNOSIS — Z794 Long term (current) use of insulin: Secondary | ICD-10-CM

## 2020-12-14 DIAGNOSIS — K121 Other forms of stomatitis: Secondary | ICD-10-CM | POA: Diagnosis present

## 2020-12-14 DIAGNOSIS — I82413 Acute embolism and thrombosis of femoral vein, bilateral: Secondary | ICD-10-CM | POA: Diagnosis not present

## 2020-12-14 DIAGNOSIS — E872 Acidosis, unspecified: Secondary | ICD-10-CM | POA: Diagnosis not present

## 2020-12-14 DIAGNOSIS — D696 Thrombocytopenia, unspecified: Secondary | ICD-10-CM | POA: Diagnosis not present

## 2020-12-14 DIAGNOSIS — Z90710 Acquired absence of both cervix and uterus: Secondary | ICD-10-CM

## 2020-12-14 DIAGNOSIS — R531 Weakness: Secondary | ICD-10-CM | POA: Diagnosis not present

## 2020-12-14 DIAGNOSIS — K2970 Gastritis, unspecified, without bleeding: Secondary | ICD-10-CM | POA: Diagnosis present

## 2020-12-14 DIAGNOSIS — R8281 Pyuria: Secondary | ICD-10-CM | POA: Diagnosis present

## 2020-12-14 DIAGNOSIS — G9340 Encephalopathy, unspecified: Secondary | ICD-10-CM | POA: Diagnosis not present

## 2020-12-14 DIAGNOSIS — R571 Hypovolemic shock: Secondary | ICD-10-CM

## 2020-12-14 DIAGNOSIS — E7800 Pure hypercholesterolemia, unspecified: Secondary | ICD-10-CM | POA: Diagnosis present

## 2020-12-14 DIAGNOSIS — T451X5A Adverse effect of antineoplastic and immunosuppressive drugs, initial encounter: Secondary | ICD-10-CM | POA: Diagnosis present

## 2020-12-14 DIAGNOSIS — Z79899 Other long term (current) drug therapy: Secondary | ICD-10-CM

## 2020-12-14 LAB — URINALYSIS, COMPLETE (UACMP) WITH MICROSCOPIC
Bacteria, UA: NONE SEEN
Bilirubin Urine: NEGATIVE
Glucose, UA: NEGATIVE mg/dL
Ketones, ur: NEGATIVE mg/dL
Nitrite: NEGATIVE
Protein, ur: 30 mg/dL — AB
Specific Gravity, Urine: 1.02 (ref 1.005–1.030)
WBC, UA: >50 WBC/hpf (ref 0–5)
pH: 6 (ref 5.0–8.0)

## 2020-12-14 LAB — CBC
HCT: 25.7 % — ABNORMAL LOW (ref 36.0–46.0)
Hemoglobin: 8.5 g/dL — ABNORMAL LOW (ref 12.0–15.0)
MCH: 28.1 pg (ref 26.0–34.0)
MCHC: 33.1 g/dL (ref 30.0–36.0)
MCV: 85.1 fL (ref 80.0–100.0)
Platelets: 63 10*3/uL — ABNORMAL LOW (ref 150–400)
RBC: 3.02 MIL/uL — ABNORMAL LOW (ref 3.87–5.11)
RDW: 15.9 % — ABNORMAL HIGH (ref 11.5–15.5)
WBC: 6.6 10*3/uL (ref 4.0–10.5)
nRBC: 0 % (ref 0.0–0.2)

## 2020-12-14 LAB — BASIC METABOLIC PANEL
Anion gap: 10 (ref 5–15)
BUN: 28 mg/dL — ABNORMAL HIGH (ref 8–23)
CO2: 18 mmol/L — ABNORMAL LOW (ref 22–32)
Calcium: 7.5 mg/dL — ABNORMAL LOW (ref 8.9–10.3)
Chloride: 98 mmol/L (ref 98–111)
Creatinine, Ser: 0.66 mg/dL (ref 0.44–1.00)
GFR, Estimated: >60 mL/min (ref >60)
Glucose, Bld: 193 mg/dL — ABNORMAL HIGH (ref 70–99)
Potassium: 4 mmol/L (ref 3.5–5.1)
Sodium: 126 mmol/L — ABNORMAL LOW (ref 135–145)

## 2020-12-14 LAB — CMP (CANCER CENTER ONLY)
ALT: 26 U/L (ref 0–44)
AST: 32 U/L (ref 15–41)
Albumin: 1.9 g/dL — ABNORMAL LOW (ref 3.5–5.0)
Alkaline Phosphatase: 213 U/L — ABNORMAL HIGH (ref 38–126)
Anion gap: 11 (ref 5–15)
BUN: 30 mg/dL — ABNORMAL HIGH (ref 8–23)
CO2: 19 mmol/L — ABNORMAL LOW (ref 22–32)
Calcium: 7.3 mg/dL — ABNORMAL LOW (ref 8.9–10.3)
Chloride: 97 mmol/L — ABNORMAL LOW (ref 98–111)
Creatinine: 0.53 mg/dL (ref 0.44–1.00)
GFR, Estimated: >60 mL/min (ref >60)
Glucose, Bld: 186 mg/dL — ABNORMAL HIGH (ref 70–99)
Potassium: 3.8 mmol/L (ref 3.5–5.1)
Sodium: 127 mmol/L — ABNORMAL LOW (ref 135–145)
Total Bilirubin: 2.8 mg/dL — ABNORMAL HIGH (ref 0.3–1.2)
Total Protein: 4.9 g/dL — ABNORMAL LOW (ref 6.5–8.1)

## 2020-12-14 LAB — PROTIME-INR
INR: 1.5 — ABNORMAL HIGH (ref 0.8–1.2)
Prothrombin Time: 17.8 seconds — ABNORMAL HIGH (ref 11.4–15.2)

## 2020-12-14 LAB — CBC WITH DIFFERENTIAL (CANCER CENTER ONLY)
Abs Immature Granulocytes: 0.1 10*3/uL — ABNORMAL HIGH (ref 0.00–0.07)
Basophils Absolute: 0.1 10*3/uL (ref 0.0–0.1)
Basophils Relative: 1 %
Eosinophils Absolute: 0 10*3/uL (ref 0.0–0.5)
Eosinophils Relative: 0 %
HCT: 23.7 % — ABNORMAL LOW (ref 36.0–46.0)
Hemoglobin: 7.9 g/dL — ABNORMAL LOW (ref 12.0–15.0)
Immature Granulocytes: 1 %
Lymphocytes Relative: 2 %
Lymphs Abs: 0.2 10*3/uL — ABNORMAL LOW (ref 0.7–4.0)
MCH: 27.2 pg (ref 26.0–34.0)
MCHC: 33.3 g/dL (ref 30.0–36.0)
MCV: 81.7 fL (ref 80.0–100.0)
Monocytes Absolute: 0.1 10*3/uL (ref 0.1–1.0)
Monocytes Relative: 1 %
Neutro Abs: 9 10*3/uL — ABNORMAL HIGH (ref 1.7–7.7)
Neutrophils Relative %: 95 %
Platelet Count: 79 10*3/uL — ABNORMAL LOW (ref 150–400)
RBC: 2.9 MIL/uL — ABNORMAL LOW (ref 3.87–5.11)
RDW: 15.9 % — ABNORMAL HIGH (ref 11.5–15.5)
WBC Count: 9.4 10*3/uL (ref 4.0–10.5)
nRBC: 0 % (ref 0.0–0.2)

## 2020-12-14 LAB — SAMPLE TO BLOOD BANK

## 2020-12-14 LAB — TROPONIN I (HIGH SENSITIVITY)
Troponin I (High Sensitivity): 21 ng/L — ABNORMAL HIGH (ref <18)
Troponin I (High Sensitivity): 15 ng/L (ref <18)

## 2020-12-14 LAB — CBG MONITORING, ED: Glucose-Capillary: 202 mg/dL — ABNORMAL HIGH (ref 70–99)

## 2020-12-14 LAB — I-STAT VENOUS BLOOD GAS, ED
Acid-base deficit: 4 mmol/L — ABNORMAL HIGH (ref 0.0–2.0)
Bicarbonate: 19.1 mmol/L — ABNORMAL LOW (ref 20.0–28.0)
Calcium, Ion: 0.97 mmol/L — ABNORMAL LOW (ref 1.15–1.40)
HCT: 27 % — ABNORMAL LOW (ref 36.0–46.0)
Hemoglobin: 9.2 g/dL — ABNORMAL LOW (ref 12.0–15.0)
O2 Saturation: 99 %
Potassium: 4.1 mmol/L (ref 3.5–5.1)
Sodium: 126 mmol/L — ABNORMAL LOW (ref 135–145)
TCO2: 20 mmol/L — ABNORMAL LOW (ref 22–32)
pCO2, Ven: 27.9 mmHg — ABNORMAL LOW (ref 44.0–60.0)
pH, Ven: 7.443 — ABNORMAL HIGH (ref 7.250–7.430)
pO2, Ven: 130 mmHg — ABNORMAL HIGH (ref 32.0–45.0)

## 2020-12-14 LAB — RESP PANEL BY RT-PCR (FLU A&B, COVID) ARPGX2
Influenza A by PCR: NEGATIVE
Influenza B by PCR: NEGATIVE
SARS Coronavirus 2 by RT PCR: NEGATIVE

## 2020-12-14 LAB — BRAIN NATRIURETIC PEPTIDE: B Natriuretic Peptide: 257.9 pg/mL — ABNORMAL HIGH (ref 0.0–100.0)

## 2020-12-14 LAB — AMMONIA
Ammonia: 84 umol/L — ABNORMAL HIGH (ref 9–35)
Ammonia: 32 umol/L (ref 9–35)

## 2020-12-14 LAB — LACTIC ACID, PLASMA
Lactic Acid, Venous: 2.6 mmol/L (ref 0.5–1.9)
Lactic Acid, Venous: 2.7 mmol/L (ref 0.5–1.9)

## 2020-12-14 IMAGING — DX DG CHEST 2V
2 series · 2 of 2 positions shown · non-contrast
Comparison: [DATE]

CLINICAL DATA: Weakness. Slurred speech. Lethargy. Currently
undergoing chemotherapy for pancreatic cancer.

EXAM:
CHEST - 2 VIEW

[chest lat]
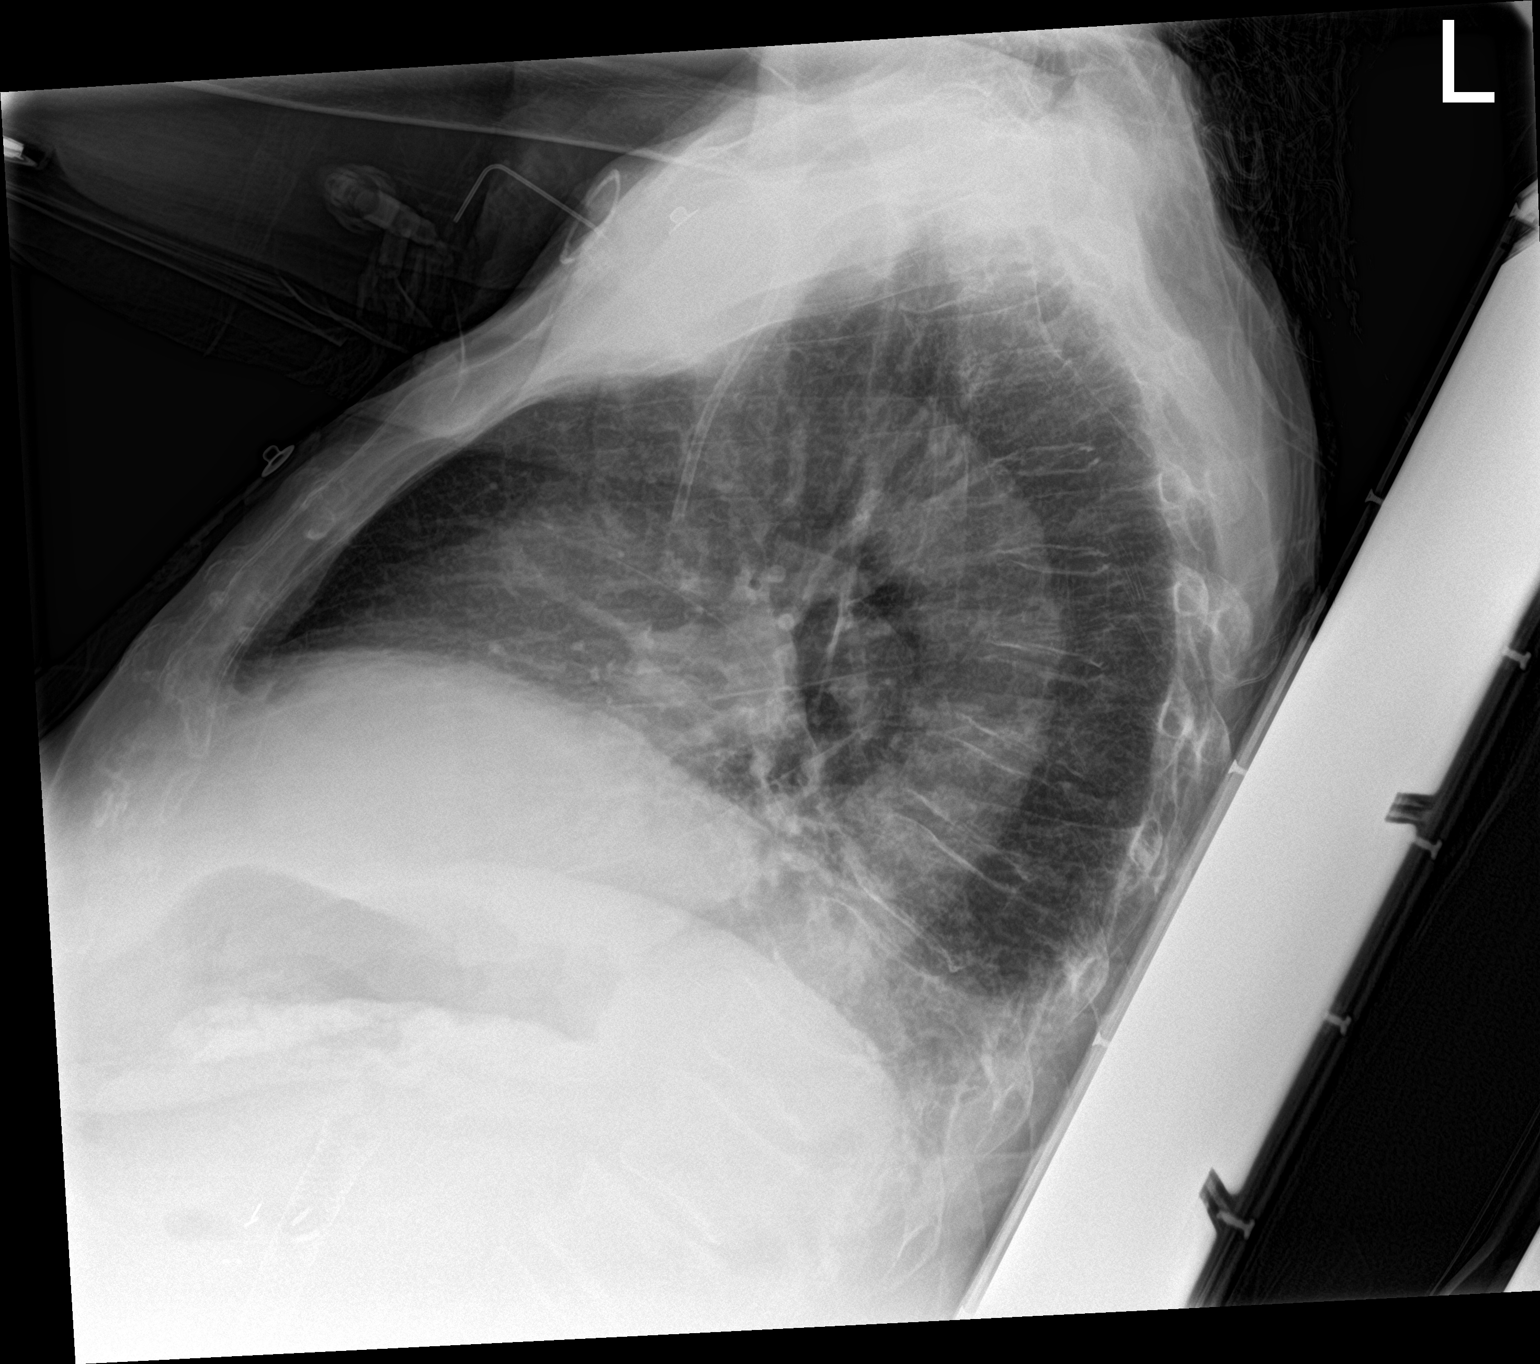

[chest ap]
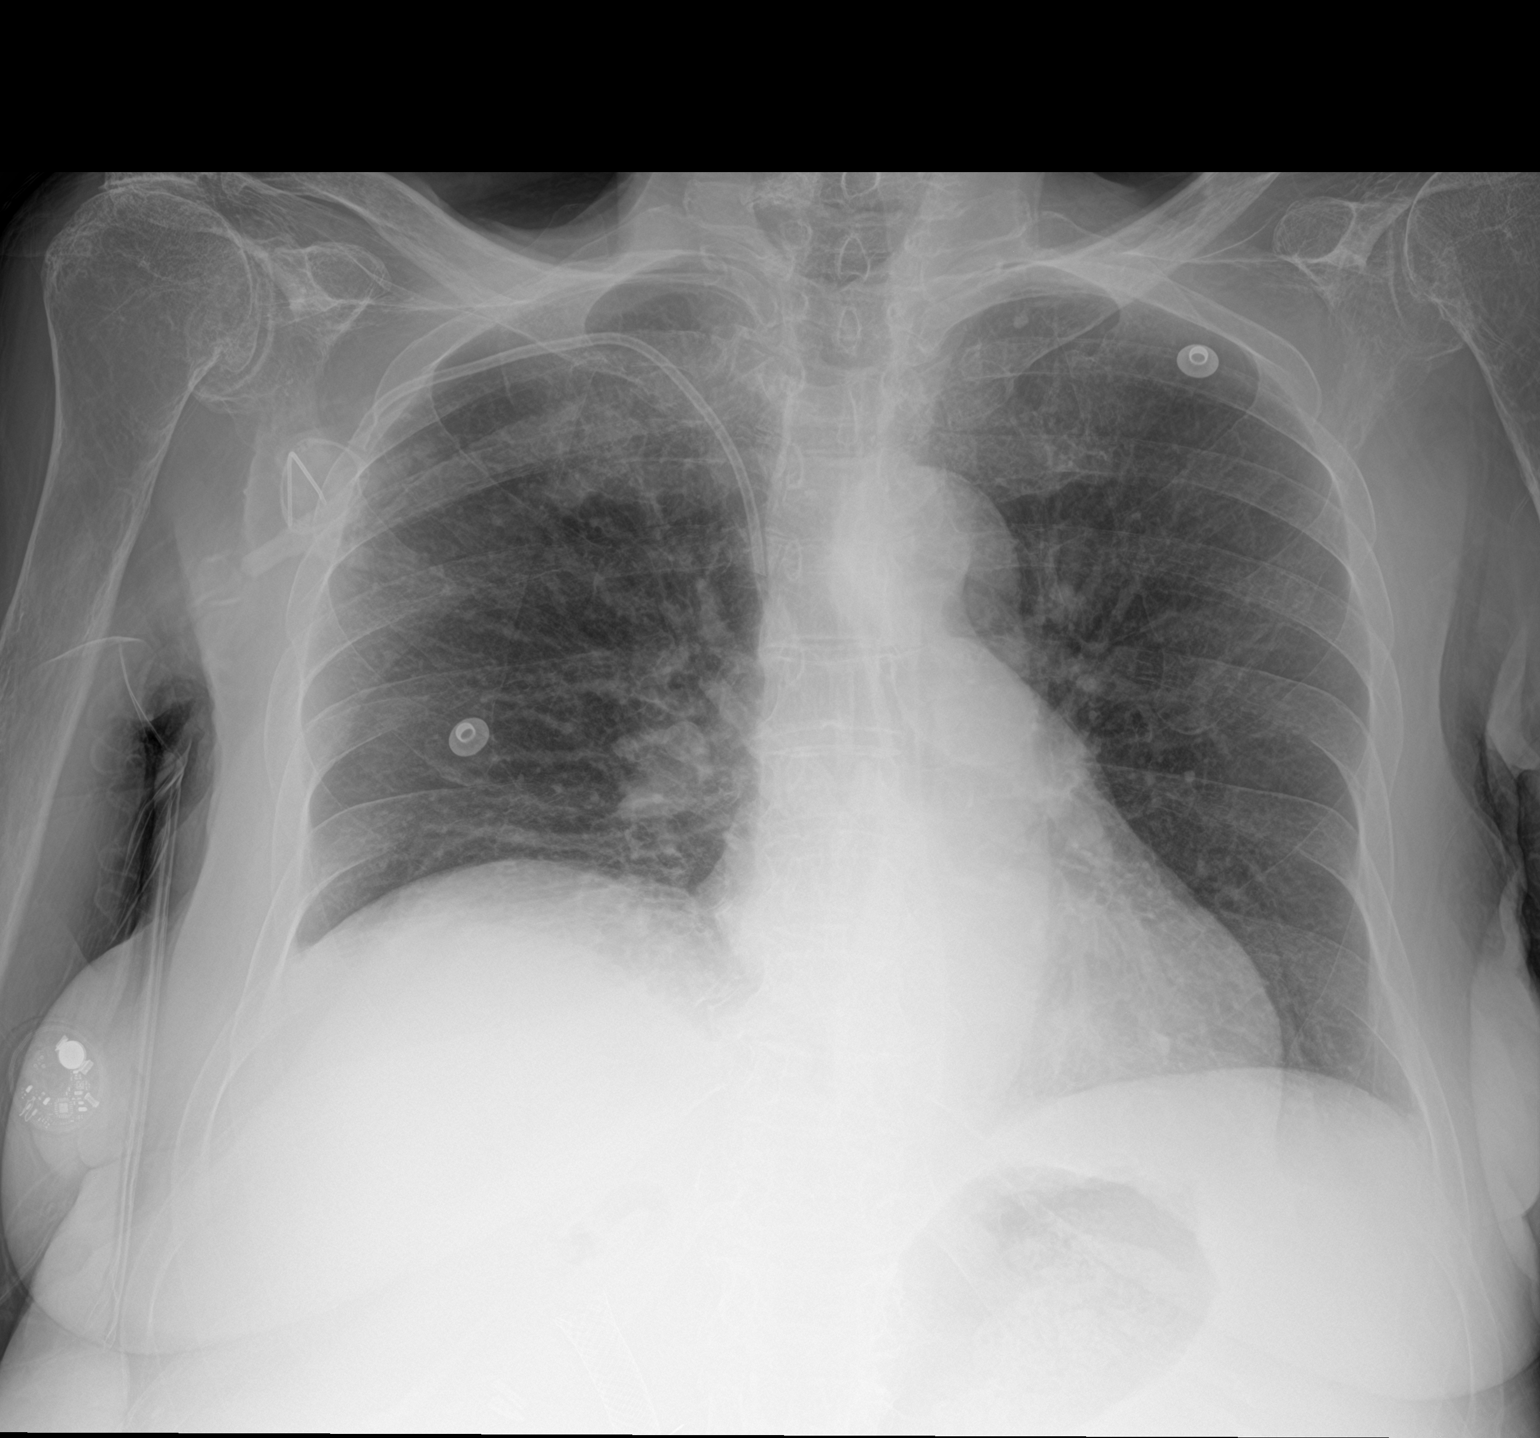

[2 of 2 positions shown; findings below may reference images not displayed]

FINDINGS: Normal sized heart. Mildly progressive elevation of the right
hemidiaphragm with mild right basilar atelectasis and possible small
right pleural effusion. Stable right subclavian porta catheter with
its tip in the proximal superior vena cava. Diffuse osteopenia.
Upper abdominal surgical clips and biliary stent.
IMPRESSION: Mildly progressive elevation of the right hemidiaphragm with mild
right basilar atelectasis and possible small right pleural effusion.

## 2020-12-14 IMAGING — CT CT HEAD W/O CM
3 series · 16 of 47 positions shown, 19 images · non-contrast
Comparison: CT [DATE].

CLINICAL DATA: Neuro deficit, acute, stroke suspected

EXAM:
CT HEAD WITHOUT CONTRAST
TECHNIQUE: Contiguous axial images were obtained from the base of the skull
through the vertex without intravenous contrast.

[Series 4: head 5.0 h30s · axial · 0.40mm/px · z∈[-11,+114]mm · 10 of 31 slices shown, 13 images]
[im 3/31  brain]
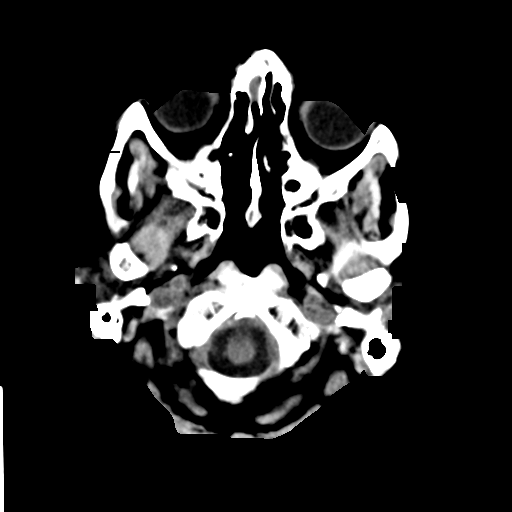
[im 3/31  bone]
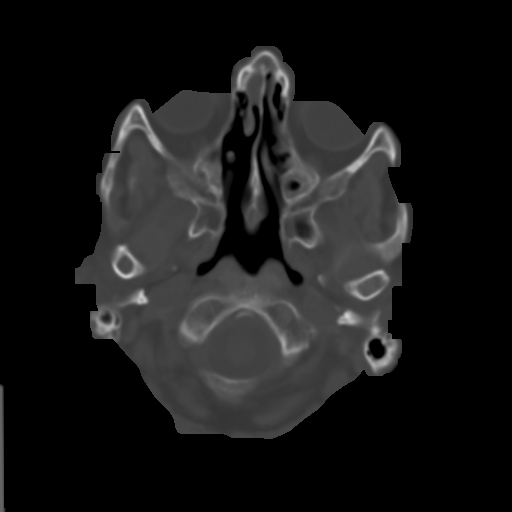
[im 6/31  brain]
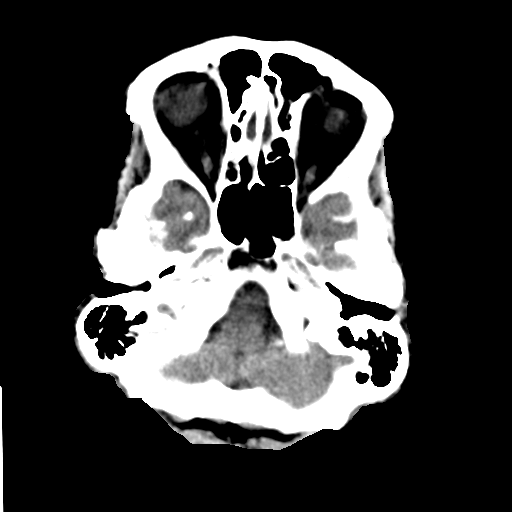
[im 9/31  brain]
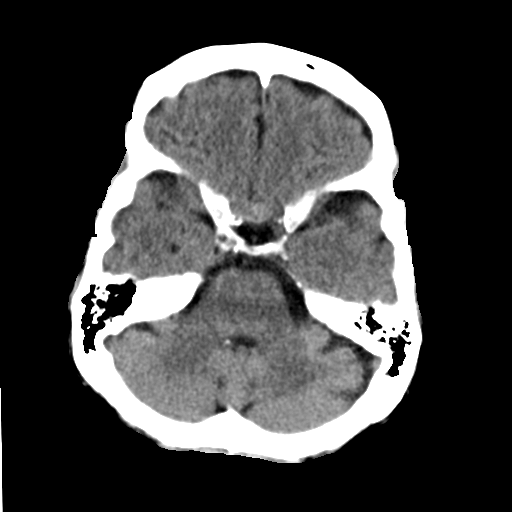
[im 11/31  brain]
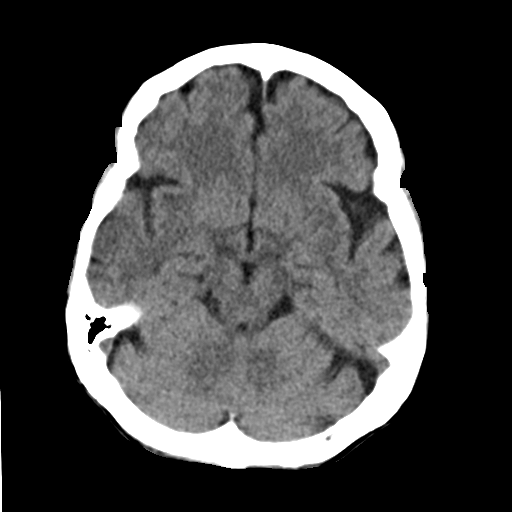
[im 14/31  brain]
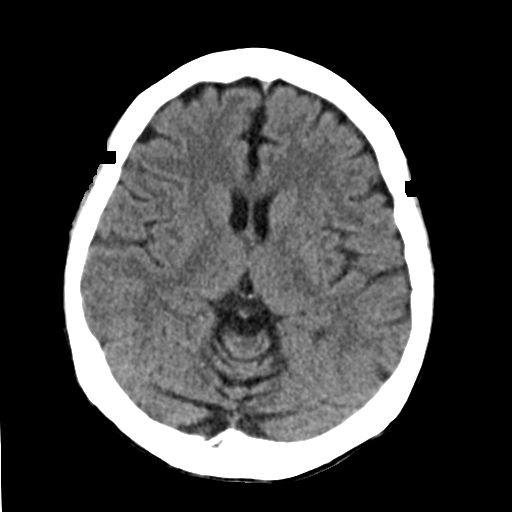
[im 14/31  bone]
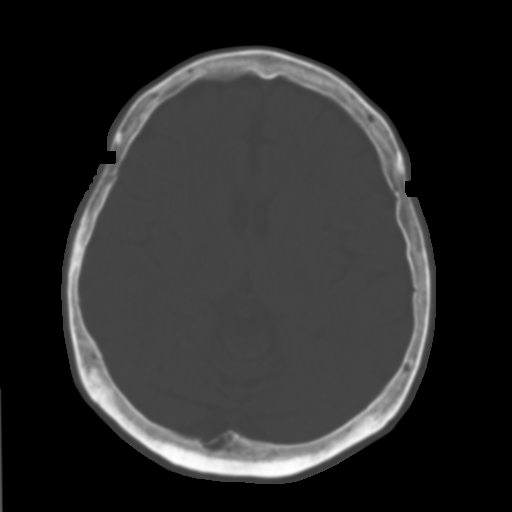
[im 17/31  brain]
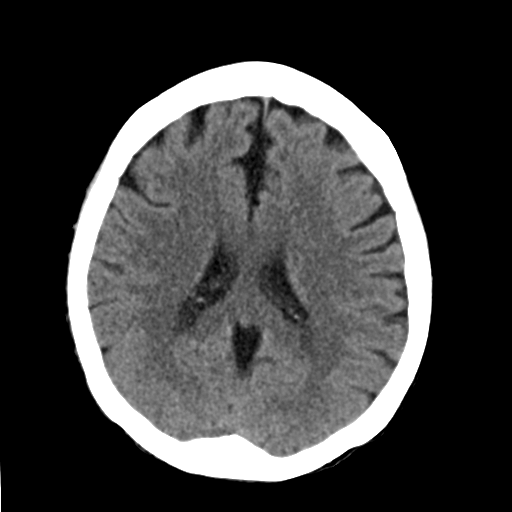
[im 20/31  brain]
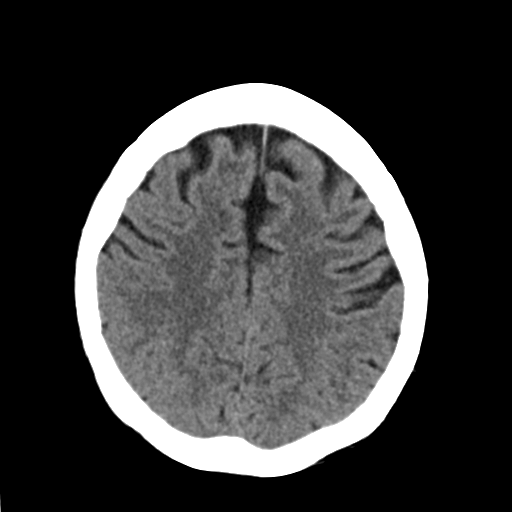
[im 23/31  brain]
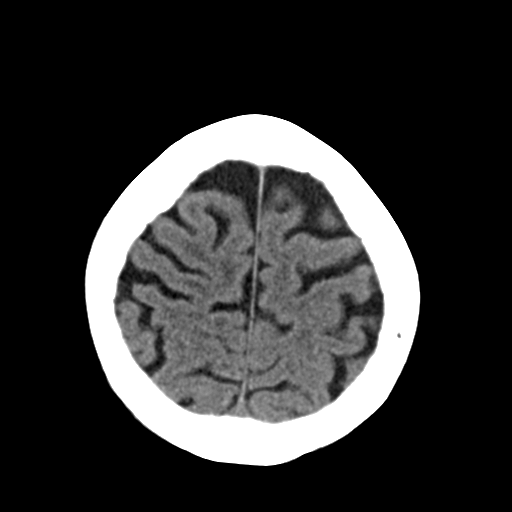
[im 25/31  brain]
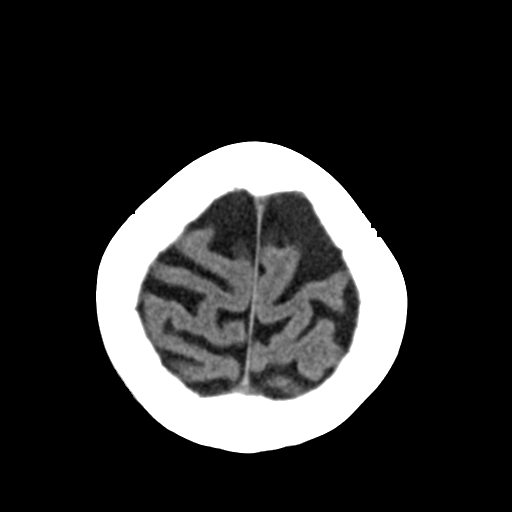
[im 25/31  bone]
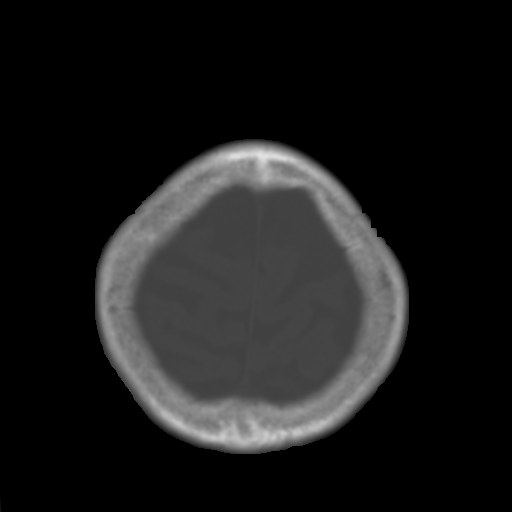
[im 28/31  brain]
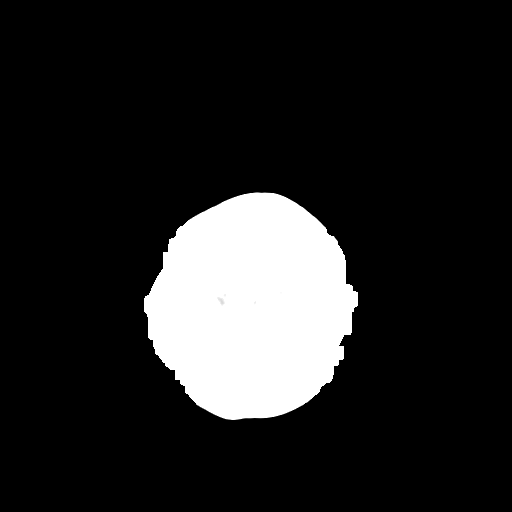

[Series 6: head 3.0 mpr cor · coronal · 0.31mm/px · 3 of 62 slices shown]
[im 21/62  brain]
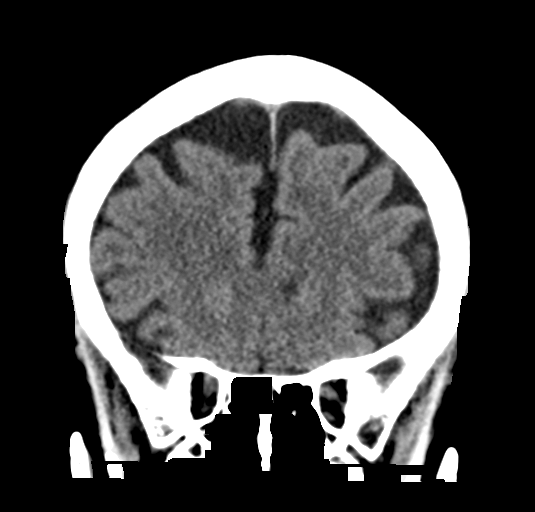
[im 28/62  brain]
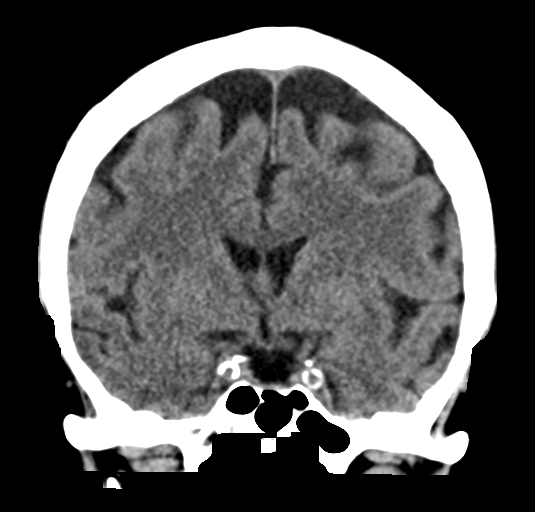
[im 34/62  brain]
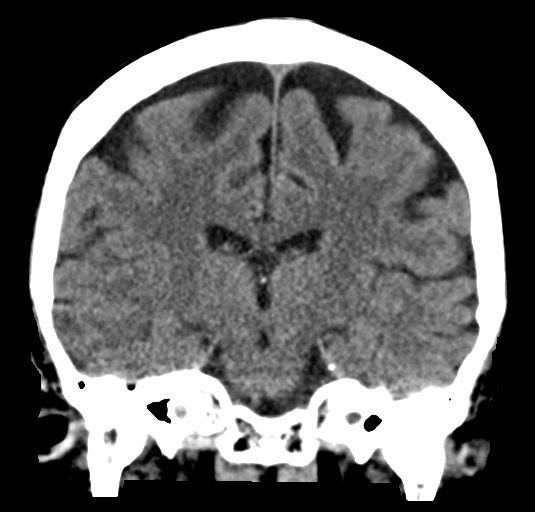

[Series 7: head 3.0 mpr sag · sagittal · 0.32mm/px · 3 of 53 slices shown]
[im 18/53  brain]
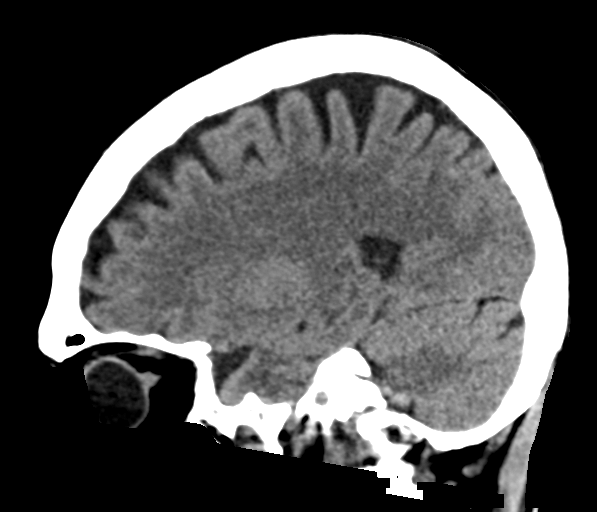
[im 27/53  brain]
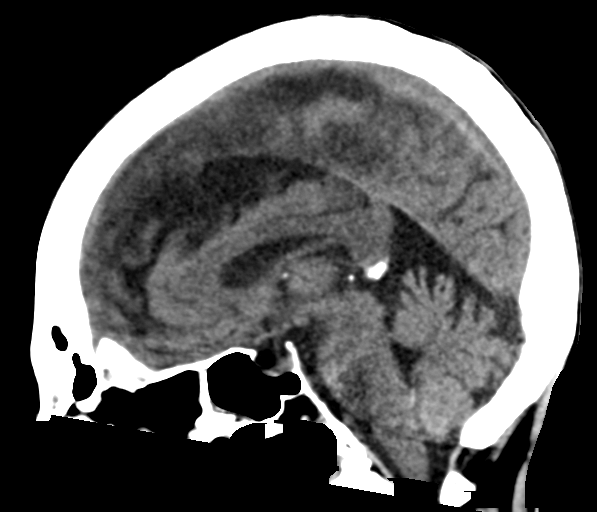
[im 35/53  brain]
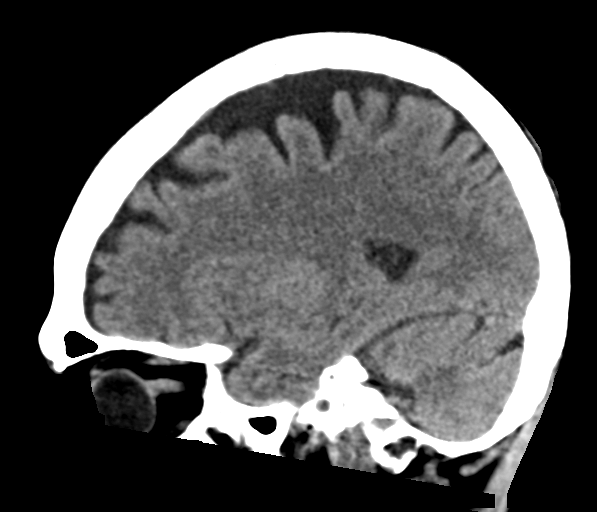

[16 of 47 positions shown; findings below may reference images not displayed]

FINDINGS: Brain: No evidence of acute intracranial hemorrhage or extra-axial
collection.No evidence of mass lesion/concern mass effect.The
ventricles are normal in size.Scattered subcortical and
periventricular white matter hypodensities, nonspecific but likely
sequela of chronic small vessel ischemic disease.

Vascular: No hyperdense vessel or unexpected calcification.

Skull: Normal. Negative for fracture or focal lesion.

Sinuses/Orbits: No acute finding.

Other: None.
IMPRESSION: No acute intracranial abnormality. Mild chronic small vessel
ischemic changes.

## 2020-12-14 MED ORDER — SODIUM CHLORIDE 0.9% FLUSH
3.0000 mL | Freq: Two times a day (BID) | INTRAVENOUS | Status: DC
  Administered 2020-12-15 – 2020-12-22 (×13): 3 mL via INTRAVENOUS

## 2020-12-14 MED ORDER — LACTATED RINGERS IV SOLN: INTRAVENOUS | Status: DC

## 2020-12-14 MED ORDER — ONDANSETRON HCL 4 MG/2ML IJ SOLN: 4.0000 mg | Freq: Four times a day (QID) | INTRAMUSCULAR | Status: DC | PRN

## 2020-12-14 MED ORDER — METRONIDAZOLE 500 MG/100ML IV SOLN
500.0000 mg | Freq: Two times a day (BID) | INTRAVENOUS | Status: DC
  Administered 2020-12-15 – 2020-12-20 (×11): 500 mg via INTRAVENOUS
  Filled 2020-12-14 (×11): qty 100

## 2020-12-14 MED ORDER — SODIUM CHLORIDE 0.9 % IV SOLN
2.0000 g | Freq: Three times a day (TID) | INTRAVENOUS | Status: DC
  Administered 2020-12-15 – 2020-12-20 (×14): 2 g via INTRAVENOUS
  Filled 2020-12-14 (×17): qty 2

## 2020-12-14 MED ORDER — INSULIN ASPART 100 UNIT/ML IJ SOLN
0.0000 [IU] | INTRAMUSCULAR | Status: DC
  Administered 2020-12-15 (×2): 1 [IU] via SUBCUTANEOUS
  Administered 2020-12-15: 2 [IU] via SUBCUTANEOUS
  Administered 2020-12-15: 1 [IU] via SUBCUTANEOUS
  Administered 2020-12-15 – 2020-12-16 (×2): 2 [IU] via SUBCUTANEOUS
  Administered 2020-12-16 – 2020-12-17 (×4): 1 [IU] via SUBCUTANEOUS
  Administered 2020-12-17: 2 [IU] via SUBCUTANEOUS
  Administered 2020-12-17: 1 [IU] via SUBCUTANEOUS
  Administered 2020-12-18: 2 [IU] via SUBCUTANEOUS
  Administered 2020-12-18 (×2): 1 [IU] via SUBCUTANEOUS
  Administered 2020-12-18: 3 [IU] via SUBCUTANEOUS
  Administered 2020-12-18 – 2020-12-19 (×2): 1 [IU] via SUBCUTANEOUS
  Administered 2020-12-19: 3 [IU] via SUBCUTANEOUS
  Administered 2020-12-19 (×2): 2 [IU] via SUBCUTANEOUS
  Administered 2020-12-19: 1 [IU] via SUBCUTANEOUS
  Administered 2020-12-20 (×2): 2 [IU] via SUBCUTANEOUS
  Administered 2020-12-20: 1 [IU] via SUBCUTANEOUS
  Administered 2020-12-20: 3 [IU] via SUBCUTANEOUS
  Administered 2020-12-20: 2 [IU] via SUBCUTANEOUS
  Administered 2020-12-21 (×2): 1 [IU] via SUBCUTANEOUS
  Administered 2020-12-21: 3 [IU] via SUBCUTANEOUS
  Administered 2020-12-21: 2 [IU] via SUBCUTANEOUS
  Administered 2020-12-22: 1 [IU] via SUBCUTANEOUS
  Administered 2020-12-22: 2 [IU] via SUBCUTANEOUS
  Administered 2020-12-22 (×2): 1 [IU] via SUBCUTANEOUS

## 2020-12-14 MED ORDER — ONDANSETRON HCL 4 MG PO TABS: 4.0000 mg | ORAL_TABLET | Freq: Four times a day (QID) | ORAL | Status: DC | PRN

## 2020-12-14 MED ORDER — SUCRALFATE 1 GM/10ML PO SUSP
1.0000 g | Freq: Two times a day (BID) | ORAL | Status: DC
  Administered 2020-12-15 – 2020-12-22 (×15): 1 g via ORAL
  Filled 2020-12-14 (×16): qty 10

## 2020-12-14 MED ORDER — LACTATED RINGERS IV BOLUS
500.0000 mL | Freq: Once | INTRAVENOUS | Status: AC
  Administered 2020-12-14: 500 mL via INTRAVENOUS

## 2020-12-14 MED ORDER — SODIUM CHLORIDE 0.9 % IV SOLN
2.0000 g | Freq: Once | INTRAVENOUS | Status: AC
  Administered 2020-12-14: 2 g via INTRAVENOUS
  Filled 2020-12-14: qty 2

## 2020-12-14 MED ORDER — ALBUMIN HUMAN 25 % IV SOLN
50.0000 g | Freq: Four times a day (QID) | INTRAVENOUS | Status: DC
  Administered 2020-12-15: 50 g via INTRAVENOUS
  Filled 2020-12-14 (×2): qty 200

## 2020-12-14 MED ORDER — LACTATED RINGERS IV SOLN: INTRAVENOUS | Status: AC

## 2020-12-14 MED ORDER — ACETAMINOPHEN 650 MG RE SUPP: 650.0000 mg | Freq: Four times a day (QID) | RECTAL | Status: DC | PRN

## 2020-12-14 MED ORDER — VANCOMYCIN HCL 1000 MG/200ML IV SOLN
1000.0000 mg | INTRAVENOUS | Status: DC
  Administered 2020-12-15: 1000 mg via INTRAVENOUS
  Filled 2020-12-14: qty 200

## 2020-12-14 MED ORDER — LACTATED RINGERS IV BOLUS (SEPSIS)
500.0000 mL | Freq: Once | INTRAVENOUS | Status: AC
  Administered 2020-12-15: 500 mL via INTRAVENOUS

## 2020-12-14 MED ORDER — ACETAMINOPHEN 325 MG PO TABS: 650.0000 mg | ORAL_TABLET | Freq: Four times a day (QID) | ORAL | Status: DC | PRN

## 2020-12-14 MED ORDER — PANTOPRAZOLE SODIUM 40 MG PO TBEC
40.0000 mg | DELAYED_RELEASE_TABLET | Freq: Every day | ORAL | Status: DC
  Administered 2020-12-15 – 2020-12-22 (×8): 40 mg via ORAL
  Filled 2020-12-14 (×8): qty 1

## 2020-12-14 MED ORDER — LACTATED RINGERS IV BOLUS: 1000.0000 mL | Freq: Once | INTRAVENOUS | Status: DC

## 2020-12-14 MED ORDER — ENOXAPARIN SODIUM 40 MG/0.4ML IJ SOSY
40.0000 mg | PREFILLED_SYRINGE | Freq: Two times a day (BID) | INTRAMUSCULAR | Status: DC
  Administered 2020-12-14 – 2020-12-15 (×2): 40 mg via SUBCUTANEOUS
  Filled 2020-12-14 (×2): qty 0.4

## 2020-12-14 MED ORDER — METRONIDAZOLE 500 MG/100ML IV SOLN
500.0000 mg | Freq: Once | INTRAVENOUS | Status: AC
  Administered 2020-12-14: 500 mg via INTRAVENOUS
  Filled 2020-12-14: qty 100

## 2020-12-14 MED ORDER — VANCOMYCIN HCL 1000 MG/200ML IV SOLN
1000.0000 mg | Freq: Once | INTRAVENOUS | Status: AC
  Administered 2020-12-14: 1000 mg via INTRAVENOUS
  Filled 2020-12-14: qty 200

## 2020-12-14 MED ORDER — LACTATED RINGERS IV BOLUS
1800.0000 mL | Freq: Once | INTRAVENOUS | Status: AC
  Administered 2020-12-14: 1800 mL via INTRAVENOUS

## 2020-12-14 NOTE — H&P (Addendum)
History and Physical    DOMINIQUE RESSEL WUJ:[id number] DOB: Jan 14, 1941 DOA: 12/14/2020  PCP: Lilian Coma., MD   Patient coming from: Home   Chief Complaint: Fatigue, confusion, not eating or drinking   HPI: Nancy Hawkins is a pleasant 80 y.o. 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  She is accompanied by her daughter who assists with the history.  The patient received cycle 1 of gemcitabine and Abraxane on 12/10/2020 and had some mild fatigue and loss of appetite initially, but has continued to become progressively fatigued and generally weak.  She has not been eating or drinking, unable to ambulate, and becoming confused over the past couple days.  She has had some loose stool but no vomiting and no complaints.  Patient was seen at the cancer center for this and directed to the ED for further evaluation.  In the emergency department, she is becoming more alert with IV fluids and denies any chest pain, cough, dysuria, or new abdominal pain.  She complains of swelling involving all extremities for the past several days and her daughter notes a significant recent weight gain.  ED Course: Upon arrival to the ED, patient is found to be afebrile, saturating well on room air, tachycardic to the 130s, and with blood pressure in the 95A to 21H systolic.  EKG features atrial fibrillation with rate 118.  Head CT negative for acute intracranial abnormality.  Chest x-ray with mild progression in right hemidiaphragm elevation with mild right basilar atelectasis and question of small right pleural effusion.  Chemistry panel notable for glucose under 93, sodium 126, and  elevated BUN to creatinine ratio.  Blood cultures were collected in the emergency department, 2.5 L of LR was administered, and the patient was started on vancomycin, Azactam, and Flagyl.  Review of Systems:  All other systems reviewed and apart from HPI, are negative.  Past Medical History:  Diagnosis Date   Diabetes mellitus without complication ([place])    type 2   Dysrhythmia    a-fib 15 yrs ago per patient on 01/17/20   GERD (gastroesophageal reflux disease)    Hypercholesteremia    Hypertension    Pancreatic cancer ([place])    Pancreatic mass    Pancreatitis    Hx   Seasonal allergies     Past Surgical History:  Procedure Laterality Date   ABDOMINAL HYSTERECTOMY     BILIARY DILATION  03/09/2020   Procedure: BILIARY DILATION;  Surgeon: Rush Landmark Telford Nab., MD;  Location: [place];  Service: Gastroenterology;;   BILIARY STENT PLACEMENT  03/09/2020   Procedure: BILIARY STENT PLACEMENT;  Surgeon: Irving Copas., MD;  [place]  Service: Gastroenterology;;   BIOPSY  11/20/2019   Procedure: BIOPSY;  Surgeon: Irving Copas., MD;  Location: [place];  Service: Gastroenterology;;   BIOPSY  07/23/2020   Procedure: BIOPSY;  Surgeon: Irving Copas., MD;  Location: [place];  Service: Gastroenterology;;   BIOPSY  12/04/2020   Procedure: BIOPSY;  Surgeon: Irving Copas., MD;  Location: [facility];  Service: Gastroenterology;;   CHOLECYSTECTOMY     COLONOSCOPY WITH PROPOFOL N/A 11/20/2019   Procedure: COLONOSCOPY WITH PROPOFOL;  Surgeon: Irving Copas., MD;  Location: [place];  Service: Gastroenterology;  Laterality: N/A;   COLONOSCOPY WITH PROPOFOL N/A 12/04/2020   Procedure: COLONOSCOPY WITH PROPOFOL;  Surgeon: Rush Landmark Telford Nab., MD;  Location: [facility];  Service: Gastroenterology;  Laterality: N/A;   ENDOSCOPIC MUCOSAL RESECTION  11/20/2019   Procedure: ENDOSCOPIC MUCOSAL RESECTION;  Surgeon:  Rush Landmark, Telford Nab., MD;  Location: [facility];  Service: Gastroenterology;;   ENDOSCOPIC RETROGRADE CHOLANGIOPANCREATOGRAPHY (ERCP) WITH PROPOFOL N/A 03/07/2020   Procedure: ENDOSCOPIC RETROGRADE CHOLANGIOPANCREATOGRAPHY (ERCP) WITH PROPOFOL;  Surgeon: Carol Ada, MD;  Location: [place];  Service: Endoscopy;  Laterality: N/A;   ERCP N/A 03/09/2020   Procedure: ENDOSCOPIC RETROGRADE CHOLANGIOPANCREATOGRAPHY (ERCP);  Surgeon: Irving Copas., MD;  Location: [place];  Service: Gastroenterology;  Laterality: N/A;   ESOPHAGOGASTRODUODENOSCOPY N/A 07/23/2020   Procedure: ESOPHAGOGASTRODUODENOSCOPY (EGD);  Surgeon: Irving Copas., MD;  Location: [place];  Service: Gastroenterology;  Laterality: N/A;   ESOPHAGOGASTRODUODENOSCOPY N/A 12/04/2020   Procedure: ESOPHAGOGASTRODUODENOSCOPY (EGD);  Surgeon: Irving Copas., MD;  Location[facility];  Service: Gastroenterology;  Laterality: N/A;   ESOPHAGOGASTRODUODENOSCOPY (EGD) WITH PROPOFOL N/A 11/20/2019   Procedure: ESOPHAGOGASTRODUODENOSCOPY (EGD) WITH PROPOFOL;  Surgeon: Rush Landmark Telford Nab., MD;  Location: [place];  Service: Gastroenterology;  Laterality: N/A;   ESOPHAGOGASTRODUODENOSCOPY (EGD) WITH PROPOFOL N/A 01/20/2020   Procedure: ESOPHAGOGASTRODUODENOSCOPY (EGD) WITH PROPOFOL;  Surgeon: Rush Landmark Telford Nab., MD;  Location: [place];  Service: Gastroenterology;  Laterality: N/A;   EUS N/A 01/20/2020   Procedure: UPPER ENDOSCOPIC ULTRASOUND (EUS) RADIAL;  Surgeon: Irving Copas., MD;  Location: [place];  Service: Gastroenterology;  Laterality: N/A;   EUS N/A 07/23/2020   Procedure: UPPER ENDOSCOPIC ULTRASOUND (EUS) RADIAL;  Surgeon: Irving Copas., MD;  Location: [place];  Service: Gastroenterology;  Laterality: N/A;   FIDUCIAL MARKER PLACEMENT N/A 07/23/2020   Procedure: FIDUCIAL MARKER PLACEMENT;  Surgeon: Irving Copas., MD;  Location: [place];   Service: Gastroenterology;  Laterality: N/A;   FINE NEEDLE ASPIRATION  11/20/2019   Procedure: FINE NEEDLE ASPIRATION (FNA) LINEAR;  Surgeon: Irving Copas., MD;  Location: [place];  Service: Gastroenterology;;   FINE NEEDLE ASPIRATION BIOPSY  01/20/2020   Procedure: FINE NEEDLE ASPIRATION BIOPSY;  Surgeon: Irving Copas., MD;  Location: [place];  Service: Gastroenterology;;   HEMOSTASIS CLIP PLACEMENT  11/20/2019   Procedure: HEMOSTASIS CLIP PLACEMENT;  Surgeon: Irving Copas., MD;  Location: [place];  Service: Gastroenterology;;   HEMOSTASIS CLIP PLACEMENT  12/04/2020   Procedure: HEMOSTASIS CLIP PLACEMENT;  Surgeon: Irving Copas., MD;  Location: [facility];  Service: Gastroenterology;;   HOT HEMOSTASIS N/A 12/04/2020   Procedure: HOT HEMOSTASIS (ARGON PLASMA COAGULATION/BICAP);  Surgeon: Irving Copas., MD;  Location: [facility];  Service: Gastroenterology;  Laterality: N/A;   NASAL SINUS SURGERY     POLYPECTOMY  11/20/2019   Procedure: POLYPECTOMY;  Surgeon: Mansouraty, Telford Nab., MD;  Location: [place];  Service: Gastroenterology;;   PORTACATH PLACEMENT N/A 03/11/2020   Procedure: INSERTION PORT-A-CATH;  Surgeon: Dwan Bolt, MD;  Location: [place];  Service: General;  Laterality: N/A;  LMA   SPHINCTEROTOMY  03/07/2020   Procedure: Joan [facility];  Surgeon: Carol Ada, MD;  Location: [place];  Service: Endoscopy;;   SPHINCTEROTOMY  03/09/2020   Procedure: Joan [facility]ans;  Surgeon: Mansouraty, Telford Nab., MD;  Location: [place];  Service: Gastroenterology;;   SUBMUCOSAL LIFTING INJECTION  11/20/2019   Procedure: SUBMUCOSAL LIFTING INJECTION;  Surgeon: Irving Copas., MD;  Location: [place];  Service: Gastroenterology;;   UPPER ESOPHAGEAL ENDOSCOPIC ULTRASOUND (EUS) N/A 11/20/2019   Procedure: UPPER  ESOPHAGEAL ENDOSCOPIC ULTRASOUND (EUS);  Surgeon: Irving Copas., MD;  Location: [place];  Service: Gastroenterology;  Laterality: N/A;    Social History:   reports that she has never smoked. She has never used smokeless tobacco. She reports current alcohol use. She reports that she does not use drugs.  Allergies  Allergen Reactions   Cephalosporins Shortness Of Breath   Erythromycin Shortness Of Breath   Fire Ant Shortness Of Breath   Penicillins Anaphylaxis and Other (See Comments)    "25 years ago"    Family History  Family history unknown: Yes     Prior to Admission medications   Medication Sig Start Date End Date Taking? Authorizing Provider  ACCU-CHEK GUIDE test strip  02/17/20   [provider]  Cholecalciferol (VITAMIN D3) 25 MCG (1000 UT) CAPS Take 1,000 Units by mouth daily.    [provider]  Cyanocobalamin (VITAMIN B-12) 2500 MCG SUBL Place 2,500 mcg under the tongue daily.    [provider]  enoxaparin (LOVENOX) 40 MG/0.4ML injection Inject 0.4 mLs (40 mg total) into the skin every 12 (twelve) hours. 12/01/20   Owens Shark, NP  Ensure Max Protein (ENSURE MAX PROTEIN) LIQD Take 330 mLs (11 oz total) by mouth 2 (two) times daily with a meal. Patient taking differently: Take 11 oz by mouth daily as needed (Supplement). 11/28/19   Geradine Girt, DO  fexofenadine (ALLEGRA) 180 MG tablet Take 180 mg by mouth in the morning.    [provider]  guaiFENesin (MUCINEX) 600 MG 12 hr tablet Take 600 mg by mouth daily.    [provider]  insulin aspart (NOVOLOG) 100 UNIT/ML FlexPen Inject 3 Units into the skin 3 (three) times daily with meals. Patient taking differently: Inject 0-3 Units into the skin 3 (three) times daily before meals. Sliding Scale 11/28/19   Geradine Girt, DO  Insulin Glargine (BASAGLAR KWIKPEN) 100 UNIT/ML Inject 12 Units into the skin daily. Patient taking differently: Inject 10-12 Units into the skin at bedtime. Sliding scale 11/28/19   Geradine Girt, DO  lidocaine-prilocaine (EMLA)  cream Apply 1 application topically as needed. Apply over port 45 minutes prior to having port accessed for chemotherapy treatment. 08/17/20   Ladell Pier, MD  lipase/protease/amylase (CREON) 36000 UNITS CPEP capsule Take 1 capsule (36,000 Units total) by mouth 3 (three) times daily before meals. TAKE 1 CAPSULE (36,000 UNITS TOTAL) BY MOUTH 3 (THREE) TIMES DAILY BEFORE MEALS. 09/14/20   Owens Shark, NP  Multiple Vitamins-Minerals (OCUVITE EYE HEALTH FORMULA PO) Take 1 tablet by mouth daily.    [provider]  pantoprazole (PROTONIX) 40 MG tablet Take 1 tablet (40 mg total) by mouth daily. 12/10/20   Cherene Altes, MD  Probiotic Product (PROBIOTIC DAILY) CAPS Take 1 capsule by mouth daily.    [provider]  sucralfate (CARAFATE) 1 GM/10ML suspension Take 10 mLs (1 g total) by mouth 2 (two) times daily. 12/09/20   Cherene Altes, MD  telmisartan (MICARDIS) 40 MG tablet Take 40 mg by mouth daily. 12/11/20   [provider]  traMADol (ULTRAM) 50 MG tablet Take 1 tablet (50 mg total) by mouth every 6 (six) hours as needed for moderate pain. 12/08/20   Ladell Pier, MD  triamcinolone (NASACORT) 55 MCG/ACT AERO nasal inhaler Place 2 sprays into the nose daily as needed (for congestion).    [provider]    Physical Exam: Vitals:   12/14/20 1450 12/14/20  1915 12/14/20 1930 12/14/20 1950  BP: (!) 90/54 (!) 85/61 (!) 91/53 (!) 99/59  Pulse: (!) 124 (!) 136 (!) 117 (!) 114  Resp: 18 16 11 18   Temp: (!) 97.5 F (36.4 C)     TempSrc: Oral     SpO2:  99% 98% 98%    Constitutional: No respiratory distres, calm  Eyes: PERTLA, lids and conjunctivae normal ENMT: Mucous membranes are dry, cracked. Posterior pharynx clear of any exudate or lesions.   Neck: supple, no masses  Respiratory: no wheezing, no crackles. No accessory muscle use.  Cardiovascular: Rate ~120 and irregularly irregular. Edema involving all extremities.  Abdomen: No distension, soft,  no guarding or rebound pain. Bowel sounds active.  Musculoskeletal: no clubbing / cyanosis. No joint deformity upper and lower extremities.   Skin: no significant rashes, lesions, ulcers. Warm, dry, well-perfused. Neurologic: CN 2-12 grossly intact, mild dysarthria. Moving all extremities with symmetric strength. Sleeping, wakes to voice and oriented to person, place, and situation.  Psychiatric: Very pleasant. Cooperative.    Labs and Imaging on Admission: I have personally reviewed following labs and imaging studies  CBC: Recent Labs  Lab 12/08/20 0606 12/09/20 0423 12/14/20 1340 12/14/20 1635 12/14/20 1810  WBC 5.9 6.0 9.4 6.6  --   NEUTROABS  --   --  9.0*  --   --   HGB 8.1* 8.1* 7.9* 8.5* 9.2*  HCT 25.1* 25.3* 23.7* 25.7* 27.0*  MCV 90.3 88.8 81.7 85.1  --   PLT 115* 132* 79* 63*  --    Basic Metabolic Panel: Recent Labs  Lab 12/08/20 0606 12/09/20 0423 12/14/20 1340 12/14/20 1635 12/14/20 1810  NA 130* 132* 127* 126* 126*  K 4.0 3.7 3.8 4.0 4.1  CL 103 104 97* 98  --   CO2 20* 22 19* 18*  --   GLUCOSE 188* 145* 186* 193*  --   BUN 8 8 30* 28*  --   CREATININE 0.37* 0.32* 0.53 0.66  --   CALCIUM 7.3* 7.5* 7.3* 7.5*  --   MG 1.8 1.9  --   --   --    GFR: Estimated Creatinine Clearance: 46.4 mL/min (by C-G formula based on SCr of 0.66 mg/dL). Liver Function Tests: Recent Labs  Lab 12/14/20 1340  AST 32  ALT 26  ALKPHOS 213*  BILITOT 2.8*  PROT 4.9*  ALBUMIN 1.9*   No results for input(s): LIPASE, AMYLASE in the last 168 hours. Recent Labs  Lab 12/14/20 1340 12/14/20 1635  AMMONIA 84* 32   Coagulation Profile: Recent Labs  Lab 12/14/20 1635  INR 1.5*   Cardiac Enzymes: No results for input(s): CKTOTAL, CKMB, CKMBINDEX, TROPONINI in the last 168 hours. BNP (last 3 results) No results for input(s): PROBNP in the last 8760 hours. HbA1C: No results for input(s): HGBA1C in the last 72 hours. CBG: Recent Labs  Lab 12/08/20 1215 12/08/20 1654  12/08/20 2138 12/09/20 0804 12/14/20 1540  GLUCAP 163* 223* 211* 115* 202*   Lipid Profile: No results for input(s): CHOL, HDL, LDLCALC, TRIG, CHOLHDL, LDLDIRECT in the last 72 hours. Thyroid Function Tests: No results for input(s): TSH, T4TOTAL, FREET4, T3FREE, THYROIDAB in the last 72 hours. Anemia Panel: No results for input(s): VITAMINB12, FOLATE, FERRITIN, TIBC, IRON, RETICCTPCT in the last 72 hours. Urine analysis:    Component Value Date/Time   COLORURINE YELLOW 12/14/2020 2100   APPEARANCEUR CLEAR 12/14/2020 2100   LABSPEC 1.020 12/14/2020 2100   PHURINE 6.0 12/14/2020 2100   GLUCOSEU  NEGATIVE 12/14/2020 2100   HGBUR MODERATE (A) 12/14/2020 2100   BILIRUBINUR NEGATIVE 12/14/2020 2100   BILIRUBINUR small (A) 11/17/2019 0906   KETONESUR NEGATIVE 12/14/2020 2100   PROTEINUR 30 (A) 12/14/2020 2100   UROBILINOGEN 0.2 11/17/2019 0906   NITRITE NEGATIVE 12/14/2020 2100   LEUKOCYTESUR LARGE (A) 12/14/2020 2100   Sepsis Labs: @LABRCNTIP (procalcitonin:4,lacticidven:4) ) Recent Results (from the past 240 hour(s))  Resp Panel by RT-PCR (Flu A&B, Covid) Nasopharyngeal Swab     Status: None   Collection Time: 12/14/20  3:44 PM   Specimen: Nasopharyngeal Swab; Nasopharyngeal(NP) swabs in vial transport medium  Result Value Ref Range Status   SARS Coronavirus 2 by RT PCR NEGATIVE NEGATIVE Final    Comment: (NOTE) SARS-CoV-2 target nucleic acids are NOT DETECTED.  The SARS-CoV-2 RNA is generally detectable in upper respiratory specimens during the acute phase of infection. The lowest concentration of SARS-CoV-2 viral copies this assay can detect is 138 copies/mL. A negative result does not preclude SARS-Cov-2 infection and should not be used as the sole basis for treatment or other patient management decisions. A negative result may occur with  improper specimen collection/handling, submission of specimen other than nasopharyngeal swab, presence of viral mutation(s) within  the areas targeted by this assay, and inadequate number of viral copies(<138 copies/mL). A negative result must be combined with clinical observations, patient history, and epidemiological information. The expected result is Negative.  Fact Sheet for Patients:  EntrepreneurPulse.com.au  Fact Sheet for Healthcare Providers:  IncredibleEmployment.be  This test is no t yet approved or cleared by the [place] FDA and  has been authorized for detection and/or diagnosis of SARS-CoV-2 by FDA under an Emergency Use Authorization (EUA). This EUA will remain  in effect (meaning this test can be used) for the duration of the COVID-19 declaration under Section 564(b)(1) of the Act, 21 U.S.C.section 360bbb-3(b)(1), unless the authorization is terminated  or revoked sooner.       Influenza A by PCR NEGATIVE NEGATIVE Final   Influenza B by PCR NEGATIVE NEGATIVE Final    Comment: (NOTE) The Xpert Xpress SARS-CoV-2/FLU/RSV plus assay is intended as an aid in the diagnosis of influenza from Nasopharyngeal swab specimens and should not be used as a sole basis for treatment. Nasal washings and aspirates are unacceptable for Xpert Xpress SARS-CoV-2/FLU/RSV testing.  Fact Sheet for Patients: EntrepreneurPulse.com.au  Fact Sheet for Healthcare Providers: IncredibleEmployment.be  This test is not yet approved or cleared by the [place] FDA and has been authorized for detection and/or diagnosis of SARS-CoV-2 by FDA under an Emergency Use Authorization (EUA). This EUA will remain in effect (meaning this test can be used) for the duration of the COVID-19 declaration under Section 564(b)(1) of the Act, 21 U.S.C. section 360bbb-3(b)(1), unless the authorization is terminated or revoked.  Performed at [facility], [place] [address], [place], [place] [address]      Radiological Exams on Admission: DG Chest 2  View  Result Date: 12/14/2020 CLINICAL DATA:  Weakness. Slurred speech. Lethargy. Currently undergoing chemotherapy for pancreatic cancer. EXAM: CHEST - 2 VIEW COMPARISON:  03/11/2020 FINDINGS: Normal sized heart. Mildly progressive elevation of the right hemidiaphragm with mild right basilar atelectasis and possible small right pleural effusion. Stable right subclavian porta catheter with its tip in the proximal superior vena cava. Diffuse osteopenia. Upper abdominal surgical clips and biliary stent. IMPRESSION: Mildly progressive elevation of the right hemidiaphragm with mild right basilar atelectasis and possible small right pleural effusion. Electronically Signed  By: Claudie Revering M.D.   On: 12/14/2020 15:59   CT Head Wo Contrast  Result Date: 12/14/2020 CLINICAL DATA:  Neuro deficit, acute, stroke suspected EXAM: CT HEAD WITHOUT CONTRAST TECHNIQUE: Contiguous axial images were obtained from the base of the skull through the vertex without intravenous contrast. COMPARISON:  CT 03/05/2020. FINDINGS: Brain: No evidence of acute intracranial hemorrhage or extra-axial collection.No evidence of mass lesion/concern mass effect.The ventricles are normal in size.Scattered subcortical and periventricular white matter hypodensities, nonspecific but likely sequela of chronic small vessel ischemic disease. Vascular: No hyperdense vessel or unexpected calcification. Skull: Normal. Negative for fracture or focal lesion. Sinuses/Orbits: No acute finding. Other: None. IMPRESSION: No acute intracranial abnormality. Mild chronic small vessel ischemic changes. Electronically Signed   By: Maurine Simmering M.D.   On: 12/14/2020 16:11    EKG: Independently reviewed. Atrial fibrillation, rate 118.   Assessment/Plan   1. Acute encephalopathy  - Presents with 3 days of not eating or drinking and progressive somnolence, general weakness, and confusion  - Head CT negative and ammonia normal  - She is improving in ED with IVF,  now awake and answering questions appropriately but remains lethargic  - Discussed DDx with patient and family, plan to continue empiric abx, IVF and albumin, suportive care, monitor electrolytes    2. Hypotension; lactic acidosis  - Antihypertensives stopped during recent admission d/t low BP but pt thinks she's still taking ARB  - SBP 80s-90s in ED in setting of recent anorexia and loose stools; lactic acid 2.6  - Blood cultures collected in ED, she was fluid-resuscitation with LR, and empiric abx started - No obvious infectious source identified, no apparent bleeding, no chest pain  - Continue antibiotics for now, start albumin, trend lactate   3. Atrial fibrillation with RVR  - In a fib with rate 110-130s in ED, appears improved with IVF  - She has hx of a fib several yrs ago  - Consider amiodarone if remains rapid despite IVF and albumin    4. Anemia; thrombocytopenia  - Hgb appears stable at 8.5 with no overt bleeding - Platelets down to 63k, possibly from chemo  - Type and screen, monitor closely    ADDENDUM: Hgb down to 6.2 am after admission. No apparent bleeding, most likely dilutional after aggressive fluid-resuscitation. Plan to transfuse 1 unit and then repeat CBC.    5. Hyponatremia  - Serum sodium is 126 on admission, down from 132 on 12/09/20  - She reports recent anorexia and loose stools but is edematous  - She was fluid-resuscitated with LR in ED in setting of hypotension, will follow serial chem panels    6. Anasarca  - Pt edematous, daughter reports recent swelling and 20 lb wt gain   - Likely multifactorial with contributions for hypoalbuminemia, gemcitabine and Abraxane, rapid a fib/possible CHF  - She was aggressively fluid-resuscitated in ED in setting of hypotension, now receiving albumin  - Consider diuresing when BP improves   7. DVT  - Continue Lovenox, monitor Hgb and platelets   8. Pancreatic cancer  - Diagnosed December 2021, s/p SBRT and modified  FOLFIRINOX   - Had cycle 1 of gemcitabine and Abraxane on 12/10/20   9. IDDM  - Continue CBG checks and insulin   10. Gastritis  - On EGD from 12/04/20  - Continue daily PPI and twice-daily Carafate   DVT prophylaxis: Treatment-dose Lovenox  Code Status: DNR, confirmed with patient and her daughter in ED  Level of Care:  Level of care: Progressive Family Communication: Daughter updated at bedside  Disposition Plan:  Patient is from: Home  Anticipated d/c is to: TBD Anticipated d/c date is: 12/18/20 Patient currently: pending rule-out sepsis, improvement in mental status, BP, and HR Consults called: None  Admission status: Inpatient     Vianne Bulls, MD [facility]  12/14/2020, 10:25 PM

## 2020-12-14 NOTE — ED Provider Notes (Signed)
[facility] EMERGENCY DEPARTMENT Provider Note   CSN: [id number] Arrival date & time: 12/14/20  1434     History Chief Complaint  Patient presents with   Weakness    Nancy Hawkins is a 80 y.o. female.  The history is provided by a relative and medical records. The history is limited by the condition of the patient.  Weakness  80 year old female with PMH significant for type II DM, atrial fibrillation on anticoagulation, HLD, HTN, pancreatic cancer on chemotherapy, and others as below who presents to the ED from the cancer center accompanied by her family with complaints of altered mental status.  Patient was reportedly behaving normally until Saturday night, when she was noted to have progressive generalized weakness.  She has not had any falls or recent injuries or illnesses.  She woke up this morning at 0400 and was notably confused per family.  Her neurologic baseline is A&O x3, conversant, and with full ROM of all 4 extremities.  She has never had a stroke and has no known neurologic deficits.  Family denies any known fevers or chills, or vomiting, chest pain or shortness of breath, or any other complaints before this.  She was noted at the cancer center to be weak and confused from baseline and was sent over for further care.  She is confused on arrival and unable to provide further history.  Patient repeatedly states when asked why she is here, "my heart."  Will not further elaborate what this refers to. Had first dose of chemotherapy last week.  Reason for transfer to the ED communicated from oncology office as per other provider's note.  Past Medical History:  Diagnosis Date   Diabetes mellitus without complication ([place])    type 2   Dysrhythmia    a-fib 15 yrs ago per patient on 01/17/20   GERD (gastroesophageal reflux disease)    Hypercholesteremia    Hypertension    Pancreatic cancer [facility])    Pancreatic mass    Pancreatitis    Hx   Seasonal allergies      Patient Active Problem List   Diagnosis Date Noted   Acute encephalopathy 12/14/2020   Hyponatremia 12/14/2020   Thrombocytopenia ([place]) 12/14/2020   SIRS (systemic inflammatory response syndrome) ([place]) 12/14/2020   AKI (acute kidney injury) ([place]) 12/14/2020   Anasarca 12/14/2020   HTN (hypertension) 12/02/2020   Paroxysmal atrial fibrillation with RVR ([place]) 12/02/2020   DVT of lower extremity, bilateral ([place]) 12/02/2020   Port-A-Cath in place 04/06/2020   Abdominal pain 03/05/2020   DMII (diabetes mellitus, type 2) ([place]) 03/05/2020   Protein malnutrition ([place]) 03/05/2020   Transaminitis 03/05/2020   Hyperbilirubinemia 03/05/2020   Primary cancer of head of pancreas ([place]) 01/29/2020   Nausea & vomiting    Acute pancreatitis    Abnormal CT scan, colon    DKA (diabetic ketoacidosis) ([place]) 11/17/2019   Pancreatic mass 11/17/2019   A-fib ([place]) 06/29/2012    Past Surgical History:  Procedure Laterality Date   ABDOMINAL HYSTERECTOMY     BILIARY DILATION  03/09/2020   Procedure: BILIARY DILATION;  Surgeon: Irving Copas., MD;  Location: [place];  Service: Gastroenterology;;   BILIARY STENT PLACEMENT  03/09/2020   Procedure: BILIARY STENT PLACEMENT;  Surgeon: Irving Copas., MD;  Location: [place];  Service: Gastroenterology;;   BIOPSY  11/20/2019   Procedure: BIOPSY;  Surgeon: Irving Copas., MD;  Location: [facility] ENDOSCOPY;  Service: Gastroenterology;;   BIOPSY  07/23/2020   Procedure:  BIOPSY;  Surgeon: Irving Copas., MD;  Location: [place];  Service: Gastroenterology;;   BIOPSY  12/04/2020   Procedure: BIOPSY;  Surgeon: Irving Copas., MD;  Location: [facility];  Service: Gastroenterology;;   CHOLECYSTECTOMY     COLONOSCOPY WITH PROPOFOL N/A 11/20/2019   Procedure: COLONOSCOPY WITH PROPOFOL;  Surgeon: Irving Copas., MD;  Location: [place];  Service: Gastroenterology;  Laterality: N/A;   COLONOSCOPY WITH  PROPOFOL N/A 12/04/2020   Procedure: COLONOSCOPY WITH PROPOFOL;  Surgeon: Rush Landmark Telford Nab., MD;  Location: [facility] ENDOSCOPY;  Service: Gastroenterology;  Laterality: N/A;   ENDOSCOPIC MUCOSAL RESECTION  11/20/2019   Procedure: ENDOSCOPIC MUCOSAL RESECTION;  Surgeon: Rush Landmark, Telford Nab., MD;  Location: [facility] ENDOSCOPY;  Service: Gastroenterology;;   ENDOSCOPIC RETROGRADE CHOLANGIOPANCREATOGRAPHY (ERCP) WITH PROPOFOL N/A 03/07/2020   Procedure: ENDOSCOPIC RETROGRADE CHOLANGIOPANCREATOGRAPHY (ERCP) WITH PROPOFOL;  Surgeon: Carol Ada, MD;  Location: [place];  Service: Endoscopy;  Laterality: N/A;   ERCP N/A 03/09/2020   Procedure: ENDOSCOPIC RETROGRADE CHOLANGIOPANCREATOGRAPHY (ERCP);  Surgeon: Irving Copas., MD;  Location: [place];  Service: Gastroenterology;  Laterality: N/A;   ESOPHAGOGASTRODUODENOSCOPY N/A 07/23/2020   Procedure: ESOPHAGOGASTRODUODENOSCOPY (EGD);  Surgeon: Irving Copas., MD;  Location: [place];  Service: Gastroenterology;  Laterality: N/A;   ESOPHAGOGASTRODUODENOSCOPY N/A 12/04/2020   Procedure: ESOPHAGOGASTRODUODENOSCOPY (EGD);  Surgeon: Irving Copas., MD;  Location: [facility];  Service: Gastroenterology;  Laterality: N/A;   ESOPHAGOGASTRODUODENOSCOPY (EGD) WITH PROPOFOL N/A 11/20/2019   Procedure: ESOPHAGOGASTRODUODENOSCOPY (EGD) WITH PROPOFOL;  Surgeon: Rush Landmark Telford Nab., MD;  Location: [place];  Service: Gastroenterology;  Laterality: N/A;   ESOPHAGOGASTRODUODENOSCOPY (EGD) WITH PROPOFOL N/A 01/20/2020   Procedure: ESOPHAGOGASTRODUODENOSCOPY (EGD) WITH PROPOFOL;  Surgeon: Rush Landmark Telford Nab., MD;  Location: [place];  Service: Gastroenterology;  Laterality: N/A;   EUS N/A 01/20/2020   Procedure: UPPER ENDOSCOPIC ULTRASOUND (EUS) RADIAL;  Surgeon: Irving Copas., MD;  Location: [place];  Service: Gastroenterology;  Laterality: N/A;   EUS N/A 07/23/2020   Procedure: UPPER ENDOSCOPIC ULTRASOUND  (EUS) RADIAL;  Surgeon: Irving Copas., MD;  Location: [place];  Service: Gastroenterology;  Laterality: N/A;   FIDUCIAL MARKER PLACEMENT N/A 07/23/2020   Procedure: FIDUCIAL MARKER PLACEMENT;  Surgeon: Irving Copas., MD;  Location: [place];  Service: Gastroenterology;  Laterality: N/A;   FINE NEEDLE ASPIRATION  11/20/2019   Procedure: FINE NEEDLE ASPIRATION (FNA) LINEAR;  Surgeon: Irving Copas., MD;  Location: [place];  Service: Gastroenterology;;   FINE NEEDLE ASPIRATION BIOPSY  01/20/2020   Procedure: FINE NEEDLE ASPIRATION BIOPSY;  Surgeon: Irving Copas., MD;  Location: [place];  Service: Gastroenterology;;   HEMOSTASIS CLIP PLACEMENT  11/20/2019   Procedure: HEMOSTASIS CLIP PLACEMENT;  Surgeon: Irving Copas., MD;  Location: [place];  Service: Gastroenterology;;   HEMOSTASIS CLIP PLACEMENT  12/04/2020   Procedure: HEMOSTASIS CLIP PLACEMENT;  Surgeon: Irving Copas., MD;  Location: [facility] ENDOSCOPY;  Service: Gastroenterology;;   HOT HEMOSTASIS N/A 12/04/2020   Procedure: HOT HEMOSTASIS (ARGON PLASMA COAGULATION/BICAP);  Surgeon: Irving Copas., MD;  Location: [facility];  Service: Gastroenterology;  Laterality: N/A;   NASAL SINUS SURGERY     POLYPECTOMY  11/20/2019   Procedure: POLYPECTOMY;  Surgeon: Mansouraty, Telford Nab., MD;  Location: [facility] ENDOSCOPY;  Service: Gastroenterology;;   PORTACATH PLACEMENT N/A 03/11/2020   Procedure: INSERTION PORT-A-CATH;  Surgeon: Dwan Bolt, MD;  Location: [place];  Service: General;  Laterality: N/A;  LMA   SPHINCTEROTOMY  03/07/2020   Procedure: Joan Mayans;  Surgeon: Carol Ada, MD;  Location: [facility] ENDOSCOPY;  Service: Endoscopy;;   SPHINCTEROTOMY  03/09/2020   Procedure: SPHINCTEROTOMY;  Surgeon: Mansouraty, Telford Nab., MD;  Location: [place];  Service: Gastroenterology;;   SUBMUCOSAL LIFTING INJECTION  11/20/2019   Procedure: SUBMUCOSAL LIFTING INJECTION;   Surgeon: Irving Copas., MD;  Location: [place];  Service: Gastroenterology;;   UPPER ESOPHAGEAL ENDOSCOPIC ULTRASOUND (EUS) N/A 11/20/2019   Procedure: UPPER ESOPHAGEAL ENDOSCOPIC ULTRASOUND (EUS);  Surgeon: Irving Copas., MD;  Location: [place];  Service: Gastroenterology;  Laterality: N/A;     OB History   No obstetric history on file.     Family History  Family history unknown: Yes    Social History   Tobacco Use   Smoking status: Never   Smokeless tobacco: Never  Vaping Use   Vaping Use: Never used  Substance Use Topics   Alcohol use: Yes    Comment: occasional wine   Drug use: Never    Home Medications Prior to Admission medications   Medication Sig Start Date End Date Taking? Authorizing Provider  ACCU-CHEK GUIDE test strip  02/17/20   [provider]  Cholecalciferol (VITAMIN D3) 25 MCG (1000 UT) CAPS Take 1,000 Units by mouth daily.    [provider]  Cyanocobalamin (VITAMIN B-12) 2500 MCG SUBL Place 2,500 mcg under the tongue daily.    [provider]  enoxaparin (LOVENOX) 40 MG/0.4ML injection Inject 0.4 mLs (40 mg total) into the skin every 12 (twelve) hours. 12/01/20   Owens Shark, NP  Ensure Max Protein (ENSURE MAX PROTEIN) LIQD Take 330 mLs (11 oz total) by mouth 2 (two) times daily with a meal. Patient taking differently: Take 11 oz by mouth daily as needed (Supplement). 11/28/19   Geradine Girt, DO  fexofenadine (ALLEGRA) 180 MG tablet Take 180 mg by mouth in the morning.    [provider]  guaiFENesin (MUCINEX) 600 MG 12 hr tablet Take 600 mg by mouth daily.    [provider]  insulin aspart (NOVOLOG) 100 UNIT/ML FlexPen Inject 3 Units into the skin 3 (three) times daily with meals. Patient taking differently: Inject 0-3 Units into the skin 3 (three) times daily before meals. Sliding Scale 11/28/19   Geradine Girt, DO  Insulin Glargine (BASAGLAR KWIKPEN) 100 UNIT/ML Inject 12  Units into the skin daily. Patient taking differently: Inject 10-12 Units into the skin at bedtime. Sliding scale 11/28/19   Geradine Girt, DO  lidocaine-prilocaine (EMLA) cream Apply 1 application topically as needed. Apply over port 45 minutes prior to having port accessed for chemotherapy treatment. 08/17/20   Ladell Pier, MD  lipase/protease/amylase (CREON) 36000 UNITS CPEP capsule Take 1 capsule (36,000 Units total) by mouth 3 (three) times daily before meals. TAKE 1 CAPSULE (36,000 UNITS TOTAL) BY MOUTH 3 (THREE) TIMES DAILY BEFORE MEALS. 09/14/20   Owens Shark, NP  Multiple Vitamins-Minerals (OCUVITE EYE HEALTH FORMULA PO) Take 1 tablet by mouth daily.    [provider]  pantoprazole (PROTONIX) 40 MG tablet Take 1 tablet (40 mg total) by mouth daily. 12/10/20   Cherene Altes, MD  Probiotic Product (PROBIOTIC DAILY) CAPS Take 1 capsule by mouth daily.    [provider]  sucralfate (CARAFATE) 1 GM/10ML suspension Take 10 mLs (1 g total) by mouth 2 (two) times daily. 12/09/20   Cherene Altes, MD  telmisartan (MICARDIS) 40 MG tablet Take 40 mg by mouth daily. 12/11/20   [provider]  traMADol (ULTRAM) 50 MG tablet  Take 1 tablet (50 mg total) by mouth every 6 (six) hours as needed for moderate pain. 12/08/20   Ladell Pier, MD  triamcinolone (NASACORT) 55 MCG/ACT AERO nasal inhaler Place 2 sprays into the nose daily as needed (for congestion).    [provider]    Allergies    Cephalosporins, Erythromycin, Fire ant, and Penicillins  Review of Systems   Review of Systems  Unable to perform ROS: Mental status change  Neurological:  Positive for weakness.   Physical Exam Updated Vital Signs BP (!) 99/59   Pulse (!) 114   Temp (!) 97.5 F (36.4 C) (Oral)   Resp 18   LMP  (LMP Unknown)   SpO2 98%   Physical Exam Vitals and nursing note reviewed.  Constitutional:      General: She is not in acute distress.    Appearance: She is  well-developed. She is not ill-appearing or diaphoretic.  HENT:     Head: Normocephalic and atraumatic.     Jaw: There is normal jaw occlusion.     Right Ear: External ear normal.     Left Ear: External ear normal.     Nose: Nose normal.     Mouth/Throat:     Mouth: Mucous membranes are dry.     Pharynx: Oropharynx is clear.  Eyes:     General: No scleral icterus.    Extraocular Movements: Extraocular movements intact.     Conjunctiva/sclera: Conjunctivae normal.     Pupils: Pupils are equal, round, and reactive to light.     Comments: Pupils 2 mm  Neck:     Trachea: Trachea and phonation normal.  Cardiovascular:     Rate and Rhythm: Normal rate and regular rhythm.     Pulses: Normal pulses.     Heart sounds: Normal heart sounds. No murmur heard. Pulmonary:     Effort: Pulmonary effort is normal. No respiratory distress.     Breath sounds: Normal breath sounds.  Abdominal:     General: There is no distension.     Palpations: Abdomen is soft.     Tenderness: There is abdominal tenderness. There is no guarding or rebound.  Musculoskeletal:     Cervical back: Normal range of motion and neck supple. No rigidity or tenderness. No spinous process tenderness or muscular tenderness.     Right lower leg: Edema present.     Left lower leg: Edema present.     Comments: LUE pitting edema, BLE 2+ pitting edema  Lymphadenopathy:     Cervical: No cervical adenopathy.  Skin:    General: Skin is warm and dry.     Capillary Refill: Capillary refill takes 2 to 3 seconds.     Coloration: Skin is pale.  Neurological:     Mental Status: She is disoriented and confused.     GCS: GCS eye subscore is 3. GCS verbal subscore is 4. GCS motor subscore is 6.     Cranial Nerves: Cranial nerves 2-12 are intact. No cranial nerve deficit, dysarthria or facial asymmetry.     Sensory: Sensation is intact.     Motor: Weakness present. No tremor, atrophy, abnormal muscle tone or seizure activity.      Comments: Patient follows commands in all extremities, notably weaker on LUE and LLE, 3/5 in strength.  5/5 in RLE and RUE.  Confused, oriented x0, deviation from normal baseline family at bedside.  No obvious cranial nerve deficits.    ED Results / Procedures / Treatments  Labs (all labs ordered are listed, but only abnormal results are displayed) Labs Reviewed  BASIC METABOLIC PANEL - Abnormal; Notable for the following components:      Result Value   Sodium 126 (*)    CO2 18 (*)    Glucose, Bld 193 (*)    BUN 28 (*)    Calcium 7.5 (*)    All other components within normal limits  CBC - Abnormal; Notable for the following components:   RBC 3.02 (*)    Hemoglobin 8.5 (*)    HCT 25.7 (*)    RDW 15.9 (*)    Platelets 63 (*)    All other components within normal limits  BRAIN NATRIURETIC PEPTIDE - Abnormal; Notable for the following components:   B Natriuretic Peptide 257.9 (*)    All other components within normal limits  URINALYSIS, COMPLETE (UACMP) WITH MICROSCOPIC - Abnormal; Notable for the following components:   Hgb urine dipstick MODERATE (*)    Protein, ur 30 (*)    Leukocytes,Ua LARGE (*)    All other components within normal limits  LACTIC ACID, PLASMA - Abnormal; Notable for the following components:   Lactic Acid, Venous 2.6 (*)    All other components within normal limits  LACTIC ACID, PLASMA - Abnormal; Notable for the following components:   Lactic Acid, Venous 2.7 (*)    All other components within normal limits  PROTIME-INR - Abnormal; Notable for the following components:   Prothrombin Time 17.8 (*)    INR 1.5 (*)    All other components within normal limits  CBG MONITORING, ED - Abnormal; Notable for the following components:   Glucose-Capillary 202 (*)    All other components within normal limits  I-STAT VENOUS BLOOD GAS, ED - Abnormal; Notable for the following components:   pH, Ven 7.443 (*)    pCO2, Ven 27.9 (*)    pO2, Ven 130.0 (*)     Bicarbonate 19.1 (*)    TCO2 20 (*)    Acid-base deficit 4.0 (*)    Sodium 126 (*)    Calcium, Ion 0.97 (*)    HCT 27.0 (*)    Hemoglobin 9.2 (*)    All other components within normal limits  TROPONIN I (HIGH SENSITIVITY) - Abnormal; Notable for the following components:   Troponin I (High Sensitivity) 21 (*)    All other components within normal limits  RESP PANEL BY RT-PCR (FLU A&B, COVID) ARPGX2  CULTURE, BLOOD (ROUTINE X 2)  CULTURE, BLOOD (ROUTINE X 2)  URINE CULTURE  AMMONIA  COMPREHENSIVE METABOLIC PANEL  MAGNESIUM  CBC  TSH  LACTIC ACID, PLASMA  TYPE AND SCREEN  TROPONIN I (HIGH SENSITIVITY)    EKG None  Radiology DG Chest 2 View  Result Date: 12/14/2020 CLINICAL DATA:  Weakness. Slurred speech. Lethargy. Currently undergoing chemotherapy for pancreatic cancer. EXAM: CHEST - 2 VIEW COMPARISON:  03/11/2020 FINDINGS: Normal sized heart. Mildly progressive elevation of the right hemidiaphragm with mild right basilar atelectasis and possible small right pleural effusion. Stable right subclavian porta catheter with its tip in the proximal superior vena cava. Diffuse osteopenia. Upper abdominal surgical clips and biliary stent. IMPRESSION: Mildly progressive elevation of the right hemidiaphragm with mild right basilar atelectasis and possible small right pleural effusion. Electronically Signed   By: Claudie Revering M.D.   On: 12/14/2020 15:59   CT Head Wo Contrast  Result Date: 12/14/2020 CLINICAL DATA:  Neuro deficit, acute, stroke suspected EXAM: CT HEAD WITHOUT CONTRAST TECHNIQUE: Contiguous  axial images were obtained from the base of the skull through the vertex without intravenous contrast. COMPARISON:  CT 03/05/2020. FINDINGS: Brain: No evidence of acute intracranial hemorrhage or extra-axial collection.No evidence of mass lesion/concern mass effect.The ventricles are normal in size.Scattered subcortical and periventricular white matter hypodensities, nonspecific but likely  sequela of chronic small vessel ischemic disease. Vascular: No hyperdense vessel or unexpected calcification. Skull: Normal. Negative for fracture or focal lesion. Sinuses/Orbits: No acute finding. Other: None. IMPRESSION: No acute intracranial abnormality. Mild chronic small vessel ischemic changes. Electronically Signed   By: Maurine Simmering M.D.   On: 12/14/2020 16:11    Procedures Procedures   Medications Ordered in ED Medications  aztreonam (AZACTAM) 2 g in sodium chloride 0.9 % 100 mL IVPB (has no administration in time range)  vancomycin (VANCOREADY) IVPB 1000 mg/200 mL (has no administration in time range)  lactated ringers bolus 500 mL (has no administration in time range)  albumin human 25 % solution 50 g (has no administration in time range)  lactated ringers infusion (has no administration in time range)  sucralfate (CARAFATE) 1 GM/10ML suspension 1 g (has no administration in time range)  pantoprazole (PROTONIX) EC tablet 40 mg (has no administration in time range)  enoxaparin (LOVENOX) injection 40 mg (has no administration in time range)  insulin aspart (novoLOG) injection 0-9 Units (has no administration in time range)  sodium chloride flush (NS) 0.9 % injection 3 mL (has no administration in time range)  acetaminophen (TYLENOL) tablet 650 mg (has no administration in time range)    Or  acetaminophen (TYLENOL) suppository 650 mg (has no administration in time range)  ondansetron (ZOFRAN) tablet 4 mg (has no administration in time range)    Or  ondansetron (ZOFRAN) injection 4 mg (has no administration in time range)  metroNIDAZOLE (FLAGYL) IVPB 500 mg (has no administration in time range)  lactated ringers bolus 500 mL (has no administration in time range)  lactated ringers bolus 1,800 mL (0 mLs Intravenous Stopped 12/14/20 2104)  aztreonam (AZACTAM) 2 g in sodium chloride 0.9 % 100 mL IVPB (0 g Intravenous Stopped 12/14/20 2034)  metroNIDAZOLE (FLAGYL) IVPB 500 mg (500 mg  Intravenous New Bag/Given 12/14/20 2100)  vancomycin (VANCOREADY) IVPB 1000 mg/200 mL (0 mg Intravenous Stopped 12/14/20 1814)    ED Course  I have reviewed the triage vital signs and the nursing notes.  Pertinent labs & imaging results that were available during my care of the patient were reviewed by me and considered in my medical decision making (see chart for details).    MDM Rules/Calculators/A&P                          SHAWNITA KRIZEK is a 80 y.o. female presenting with weakness. Initial VS sig for hypotension.  EKG interpretation: Atrial fibrillation with RVR, rate 118 bpm.  Normal intervals, no acute ischemia or infarct, low voltage.  Labs: VBG consistent with mixed metabolic and respiratory alkalosis.  BNP mildly elevated unknown baseline.  Initial lactic acid 2.6, repeat 2.7.  Initial troponin of 21, repeat 15.  Normocytic anemia near prior baseline, no leukocytosis.  Thrombocytopenia of 63 decreased from prior baseline of 79.  PT/INR elevated.  Hyponatremic at 126, decreased from prior and mildly hyperglycemic with elevated BUN.  Anion gap of 10.  Ammonia of 32.  Blood cultures and urine cultures pending.  UA with moderate hematuria and large leukocytes, negative nitrate and negative for bacteria.  COVID/flu  negative.  Imaging: CT head negative for acute intracranial findings. CXR with mildly elevated right hemidiaphragm, otherwise unremarkable. Imaging was reviewed by radiology and personally by me.  DDX considered: Sepsis, bacteremia, acute blood loss anemia, UTI, ACS, CVA, hemorrhagic stroke, trauma, pneumonia, neutropenia. History, examination, and objective data most consistent with hypovolemic shock with encephalopathy.  No obvious source of infection at this time, broad-spectrum antibiotics given as patient is immunocompromised with vitals consistent with sepsis.  Ammonia downtrending, low suspicion for hepatic encephalopathy.  UA does not appear to be infectious source, and CXR  without focal lobar opacity.  CT head without hemorrhagic stroke.  Weakness and mental status improving after fluid resuscitation, low suspicion for CVA at this time. Low suspicion for intracranial mets or mass as etiology of AMS.  Medications: Medications  aztreonam (AZACTAM) 2 g in sodium chloride 0.9 % 100 mL IVPB (has no administration in time range)  vancomycin (VANCOREADY) IVPB 1000 mg/200 mL (has no administration in time range)  lactated ringers bolus 500 mL (has no administration in time range)  albumin human 25 % solution 50 g (has no administration in time range)  lactated ringers infusion (has no administration in time range)  sucralfate (CARAFATE) 1 GM/10ML suspension 1 g (has no administration in time range)  pantoprazole (PROTONIX) EC tablet 40 mg (has no administration in time range)  enoxaparin (LOVENOX) injection 40 mg (has no administration in time range)  insulin aspart (novoLOG) injection 0-9 Units (has no administration in time range)  sodium chloride flush (NS) 0.9 % injection 3 mL (has no administration in time range)  acetaminophen (TYLENOL) tablet 650 mg (has no administration in time range)    Or  acetaminophen (TYLENOL) suppository 650 mg (has no administration in time range)  ondansetron (ZOFRAN) tablet 4 mg (has no administration in time range)    Or  ondansetron (ZOFRAN) injection 4 mg (has no administration in time range)  metroNIDAZOLE (FLAGYL) IVPB 500 mg (has no administration in time range)  lactated ringers bolus 500 mL (has no administration in time range)  lactated ringers bolus 1,800 mL (0 mLs Intravenous Stopped 12/14/20 2104)  aztreonam (AZACTAM) 2 g in sodium chloride 0.9 % 100 mL IVPB (0 g Intravenous Stopped 12/14/20 2034)  metroNIDAZOLE (FLAGYL) IVPB 500 mg (500 mg Intravenous New Bag/Given 12/14/20 2100)  vancomycin (VANCOREADY) IVPB 1000 mg/200 mL (0 mg Intravenous Stopped 12/14/20 1814)    Re-evaluated after fluid resuscitation, significantly  more alert and oriented x1.  Answering questions appropriately intermittently.  Following commands.  Hypotension improving and in no acute distress.  Weakness improving.  Patient anticoagulated, rate mildly tachycardic, but rate controlled not administered given tenuous BP.  Admitted to hospitalist in stable but serious condition.  Further management per accepting team.  Family understands and agrees with the plan updated at the bedside multiple times.   The plan for this patient was discussed with my attending physician, who voiced agreement and who oversaw evaluation and treatment of this patient.     Note: Estate manager/land agent was used in the creation of this note.  Final Clinical Impression(s) / ED Diagnoses Final diagnoses:  Hypovolemic shock [facility])    Rx / DC Orders ED Discharge Orders     None        Cherly Hensen, DO 12/14/20 2326    Lajean Saver, MD 12/14/20 2328

## 2020-12-14 NOTE — Progress Notes (Signed)
[place] OFFICE PROGRESS NOTE   Diagnosis: Pancreas cancer  INTERVAL HISTORY:   Nancy Hawkins completed cycle 1 gemcitabine/Abraxane 12/10/2020.  She presents today, accompanied by her daughter, with multiple complaints.  Her daughter provides the history.  She reports [name] began sleeping a lot and not eating 2 days ago.  She is very weak.  She is unable to ambulate.  She is confused.  No nausea or vomiting.  No diarrhea.  She had chills after the chemotherapy last week.  No fever.  No complaints of pain.  No known bleeding.  Speech has been difficult to understand.  Objective:  Vital signs in last 24 hours:  Blood pressure 92/66, pulse 80, temperature 98.1 F (36.7 C), temperature source Oral, resp. rate 18, height 5\' 3"  (1.6 m), SpO2 100 %.    HEENT: Lips are dry, cracked.  Sclera anicteric. Resp: Lungs clear bilaterally. Cardio: Irregular. GI: Abdomen soft and nontender. Vascular: Anasarca. Neuro: She is lethargic.  Confused.  Follows some commands.  Speech intermittently unintelligible.  Moves all extremities. Port-A-Cath without erythema.  Lab Results:  Lab Results  Component Value Date   WBC 6.0 12/09/2020   HGB 8.1 (L) 12/09/2020   HCT 25.3 (L) 12/09/2020   MCV 88.8 12/09/2020   PLT 132 (L) 12/09/2020   NEUTROABS 5.9 12/07/2020    Imaging:  No results found.  Medications: I have reviewed the patient's current medications.  Assessment/Plan: Pancreas cancer, borderline resectable CT abdomen/pelvis-inflammation involving the mid body of the pancreas with peripancreatic fat stranding; possible 2.5 x 2.2 cm low-attenuation mass within the mid body of the pancreas, appears to abut the celiac axis as well as the splenic vein.   MRI 11/18/2019-extensive inflammatory fat stranding about the pancreas with a thinly septated multicystic fluid signal lesion of the anterior pancreatic neck measuring 3.2 x 1.7 cm most consistent with a pancreatic  pseudocyst; numerous additional small fluid signal lesions throughout the pancreatic parenchyma, the remaining lesions subcentimeter; central portion of pancreatic duct dilated measuring up to 8 mm without obvious obstructing lesion identified; trace ascites; trace bilateral pleural effusions and associated atelectasis or consolidation.   Colonoscopy 11/20/2019-5 polyps (resected and retrieved- multiple tubular adenomas, negative for high-grade dysplasia); 2 colonic angioectasias; diverticulosis in the entire examined colon; nonbleeding nonthrombosed external and internal hemorrhoids.   EGD/EUS 11/20/2019-few white nummular lesions in the entire esophagus (esophagus biopsy with no significant pathologic findings, no fungal elements); hematin found in the entire examined stomach; gastritis (stomach biopsy showed gastropathy/gastritis); 14 mm semisessile polyp found in the posterior wall of the gastric antrum (hyperplastic polyp); a lesion was identified in the pancreatic body staged T2 N0 MX.  FNA of the pancreas lesion showed benign reactive/reparative changes, prominent inflammation.   EUS procedure 01/20/2020-masslike region identified in the pancreatic head, stage T2 N0 MX.  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 CTs chest/pelvis 01/29/2020-no evidence of metastatic disease CT abdomen/pelvis 03/05/2020-increase in size of pancreas head/uncinate mass, marked narrowing of the adjacent portal vein, patchy consolidation in the right lower lung, mild intrahepatic and extrahepatic biliary ductal dilatation Cycle 1 FOLFOX 03/23/2020 Cycle 2 FOLFOX 04/06/2020, Emend added for delayed nausea after cycle 1 Cycle 3 FOLFIRINOX 04/20/2020 Cycle 4 FOLFIRINOX 05/04/2020, Udenyca Cycle 5 FOLFIRINOX 05/18/2020, Udenyca Cycle 6 FOLFIRINOX 06/02/2020, Udenyca CTs 06/15/2020- decreased size of pancreatic mass, still with portal vein involvement and extensive upper  abdominal collaterals. 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 Cycle 1 gemcitabine/Abraxane 12/10/2020 Pancreatitis October 2021 Diabetes October 2021 Isolated episode of atrial fibrillation approximately 15 years ago Hypertension COVID-19 infection Admission 03/05/2020 with biliary obstruction-ERCP 03/09/2020, biliary obstruction secondary to known pancreatic head mass found in the lower third of the main duct.  The upper third of the main bile duct and left and right hepatic ducts and all intrahepatic branches moderately dilated secondary to the stricture.  Biliary sphincterotomy performed.  Covered metal biliary stent placed into the common bile duct. Port-A-Cath placement 03/11/2020, Dr. 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  She completed a cycle of gemcitabine/Abraxane 12/10/2020.  She presents today with altered mental status over the past 48 hours.  Dr. Benay Spice reviewed potential etiologies with her and her daughter including metastatic disease to the brain, stroke, hypercalcemia.  Port-A-Cath is being accessed, obtain labs to include CBC, chemistry panel, ammonia level.  We are making arrangements for ambulance transport to the emergency department.  Patient seen with Dr. Benay Spice.  Ned Card ANP/GNP-BC   12/14/2020  12:40 PM [name] was interviewed and examined.  She is now at day 5 following cycle 1 gemcitabine/Abraxane.  She developed altered mental status over the weekend.  This has persisted.  She appears  confused today.  We did not attempt to have her ambulate.  We discussed the differential diagnosis with Ms. Reilly and her daughter including a metabolic encephalopathy, CVA, and brain metastases.  She agrees to hospital admission for further evaluation.  She appears to have rapid atrial fibrillation.   We discussed CPR and ACLS.  She agrees to no CODE BLUE status.  I was present for greater than 50% of today's visit.  I performed medical decision making.  Julieanne Manson, MD

## 2020-12-14 NOTE — Telephone Encounter (Signed)
Called  and spoke with Hassan Rowan RN at [facility]  the primary nurse caring for this pt gave report and contact info to our office.

## 2020-12-14 NOTE — Progress Notes (Signed)
Pharmacy Antibiotic Note  Nancy Hawkins is a 80 y.o. female admitted on 12/14/2020 with sepsis.  Pharmacy has been consulted for Aztreonam and vancomycin dosing.  Of note, patient has a h/o pancreatic cancer on chemotherapy with gemcitabine/Abraxane s/p one cycle. She was noted to be weak and confused and sent to ED. WBC wnl, hypothermic with soft BP. SCr low   Plan: -Start Aztreonam 2 gm IV Q 8 hours -Vancomycin 1000 mg IV Q 24 hrs. Goal AUC 400-550. Expected AUC: 470 SCr used: 0.7  -Monitor CBC, renal fx, cultures and clinical progress -Vancomycin levels as indicated       Temp (24hrs), Avg:97.8 F (36.6 C), Min:97.5 F (36.4 C), Max:98.1 F (36.7 C)  Recent Labs  Lab 12/08/20 0606 12/09/20 0423 12/14/20 1340  WBC 5.9 6.0 9.4  CREATININE 0.37* 0.32* 0.53    Estimated Creatinine Clearance: 46.4 mL/min (by C-G formula based on SCr of 0.53 mg/dL).    Allergies  Allergen Reactions   Cephalosporins Shortness Of Breath   Erythromycin Shortness Of Breath   Fire Ant Shortness Of Breath   Penicillins Anaphylaxis and Other (See Comments)    "25 years ago"    Antimicrobials this admission: Aztreonam 11/7 >>  Vancomycin 11/7 >>   Dose adjustments this admission:   Microbiology results: 11/7 BCx:    Thank you for allowing pharmacy to be a part of this patient's care.  Albertina Parr, PharmD., BCPS, BCCCP Clinical Pharmacist Please refer to [facility] for unit-specific pharmacist

## 2020-12-14 NOTE — ED Provider Notes (Addendum)
Emergency Medicine Provider Triage Evaluation Note  Nancy Hawkins , a 80 y.o. female  was evaluated in the hallway.  She has history of pancreatic cancer and was sent here by her oncologist with concern for a stroke per husband.  Patient difficult to obtain a history from however reports she is here for her heart.  No pain or positive ROS.  Review of Systems  Positive:  Negative:   Physical Exam  BP (!) 90/54   Pulse (!) 124   Temp (!) 97.5 F (36.4 C) (Oral)   Resp 18   LMP  (LMP Unknown)  Gen:   Awake, no distress   Resp:  Normal effort  MSK:   Moves extremities without difficulty  Other:  3+ pitting edema in bilateral Hawkins extremities.  Slow to respond to questions. Left sided pronator drift.   Medical Decision Making  Medically screening exam initiated at 3:04 PM.  Appropriate orders placed.  Nancy Hawkins was informed that the remainder of the evaluation will be completed by another provider, this initial triage assessment does not replace that evaluation, and the importance of remaining in the ED until their evaluation is complete.  I was able to get in touch with her children who are in the emergency department currently.  They were sent over from heme-onc due to concern for a stroke.  They report that their mother usually is talkative and responds to commands and questions.  CT head ordered.  I spoke with the patient's oncologist, MD Nancy Hawkins, who stated that they sent the patient for further evaluation for potential stroke, brain metastases or overall altered mental status.  He reported that the patient has been declining since late last week.  He was able to get full range of motion in all extremities and some response during history taking however believes she needed a higher level of care.  He reports that she is a no code at this time.   Nancy Hawkins 12/14/20 1541    Nancy Lower, MD 12/15/20 [phone]

## 2020-12-14 NOTE — ED Triage Notes (Signed)
Pt here from [facility]. Family reports weakness, slurred speech and lethargy X3 days. Undergoing Chemotherapy. Notable edema throughout body. Transported to [facility] for CVA rule out.   BP 90/40 HR 130-140 100% RA Alert and oriented to self

## 2020-12-15 ENCOUNTER — Inpatient Hospital Stay (HOSPITAL_COMMUNITY): Payer: Medicare Other

## 2020-12-15 DIAGNOSIS — G9340 Encephalopathy, unspecified: Secondary | ICD-10-CM | POA: Diagnosis not present

## 2020-12-15 LAB — CBG MONITORING, ED
Glucose-Capillary: 165 mg/dL — ABNORMAL HIGH (ref 70–99)
Glucose-Capillary: 156 mg/dL — ABNORMAL HIGH (ref 70–99)
Glucose-Capillary: 146 mg/dL — ABNORMAL HIGH (ref 70–99)

## 2020-12-15 LAB — GLUCOSE, CAPILLARY
Glucose-Capillary: 129 mg/dL — ABNORMAL HIGH (ref 70–99)
Glucose-Capillary: 138 mg/dL — ABNORMAL HIGH (ref 70–99)
Glucose-Capillary: 146 mg/dL — ABNORMAL HIGH (ref 70–99)
Glucose-Capillary: 150 mg/dL — ABNORMAL HIGH (ref 70–99)

## 2020-12-15 LAB — COMPREHENSIVE METABOLIC PANEL
ALT: 26 U/L (ref 0–44)
AST: 42 U/L — ABNORMAL HIGH (ref 15–41)
Albumin: 1.8 g/dL — ABNORMAL LOW (ref 3.5–5.0)
Alkaline Phosphatase: 169 U/L — ABNORMAL HIGH (ref 38–126)
Anion gap: 8 (ref 5–15)
BUN: 26 mg/dL — ABNORMAL HIGH (ref 8–23)
CO2: 19 mmol/L — ABNORMAL LOW (ref 22–32)
Calcium: 7.3 mg/dL — ABNORMAL LOW (ref 8.9–10.3)
Chloride: 101 mmol/L (ref 98–111)
Creatinine, Ser: 0.53 mg/dL (ref 0.44–1.00)
GFR, Estimated: >60 mL/min (ref >60)
Glucose, Bld: 168 mg/dL — ABNORMAL HIGH (ref 70–99)
Potassium: 3.7 mmol/L (ref 3.5–5.1)
Sodium: 128 mmol/L — ABNORMAL LOW (ref 135–145)
Total Bilirubin: 2.8 mg/dL — ABNORMAL HIGH (ref 0.3–1.2)
Total Protein: 4.1 g/dL — ABNORMAL LOW (ref 6.5–8.1)

## 2020-12-15 LAB — CBC
HCT: 18 % — ABNORMAL LOW (ref 36.0–46.0)
HCT: 22.4 % — ABNORMAL LOW (ref 36.0–46.0)
Hemoglobin: 6.2 g/dL — CL (ref 12.0–15.0)
Hemoglobin: 7.6 g/dL — ABNORMAL LOW (ref 12.0–15.0)
MCH: 28.6 pg (ref 26.0–34.0)
MCH: 28 pg (ref 26.0–34.0)
MCHC: 34.4 g/dL (ref 30.0–36.0)
MCHC: 33.9 g/dL (ref 30.0–36.0)
MCV: 82.9 fL (ref 80.0–100.0)
MCV: 82.7 fL (ref 80.0–100.0)
Platelets: 41 10*3/uL — ABNORMAL LOW (ref 150–400)
Platelets: 37 10*3/uL — ABNORMAL LOW (ref 150–400)
RBC: 2.17 MIL/uL — ABNORMAL LOW (ref 3.87–5.11)
RBC: 2.71 MIL/uL — ABNORMAL LOW (ref 3.87–5.11)
RDW: 15.9 % — ABNORMAL HIGH (ref 11.5–15.5)
RDW: 15.4 % (ref 11.5–15.5)
WBC: 3.5 10*3/uL — ABNORMAL LOW (ref 4.0–10.5)
WBC: 3 10*3/uL — ABNORMAL LOW (ref 4.0–10.5)
nRBC: 0 % (ref 0.0–0.2)
nRBC: 0 % (ref 0.0–0.2)

## 2020-12-15 LAB — MAGNESIUM: Magnesium: 1.6 mg/dL — ABNORMAL LOW (ref 1.7–2.4)

## 2020-12-15 LAB — TSH: TSH: 0.924 u[IU]/mL (ref 0.350–4.500)

## 2020-12-15 LAB — PREPARE RBC (CROSSMATCH)

## 2020-12-15 LAB — LACTIC ACID, PLASMA: Lactic Acid, Venous: 2.1 mmol/L (ref 0.5–1.9)

## 2020-12-15 IMAGING — MR MR HEAD W/O CM
12 of 13 series · 44 of 48 positions shown · non-contrast
Comparison: CT head [DATE], no prior MRI

CLINICAL DATA: Stroke suspected

EXAM:
MRI HEAD WITHOUT CONTRAST
TECHNIQUE: Multiplanar, multiecho pulse sequences of the brain and surrounding
structures were obtained without intravenous contrast.

[Series 13: DWI · axial · 3.0mm · 0.88mm/px · z∈[-100,+34]mm · 9 of 92 slices shown (1 of 4)]
[im 1/92]
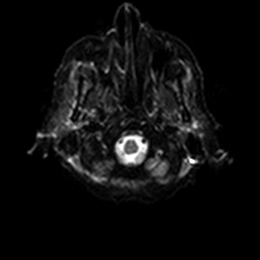
[im 12/92]
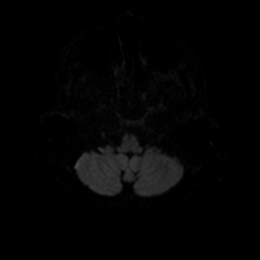
[im 23/92]
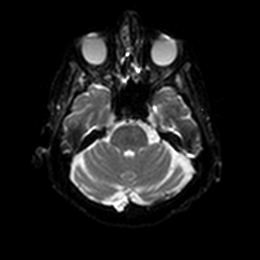
[im 35/92]
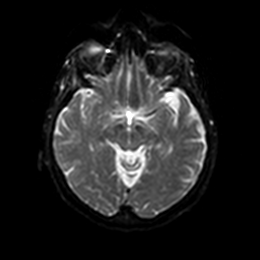
[im 46/92]
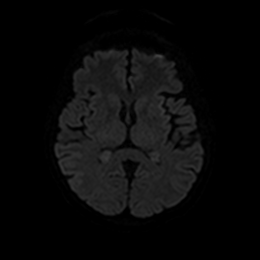
[im 57/92]
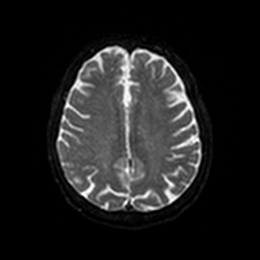
[im 69/92]
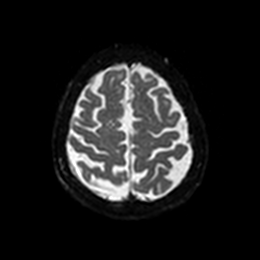
[im 80/92]
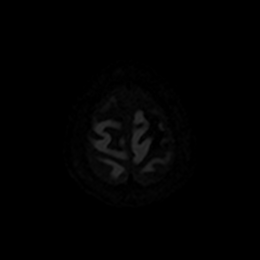
[im 92/92]
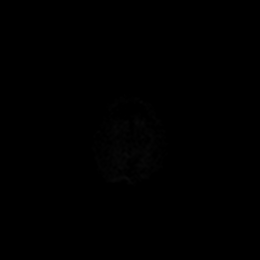

[Series 14: DWI · axial · 3.0mm · 0.88mm/px · z∈[-100,+34]mm · 4 of 44 slices shown (2 of 4)]
[im 1/44]
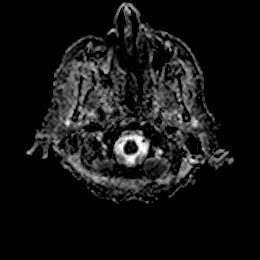
[im 15/44]
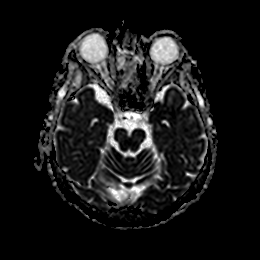
[im 29/44]
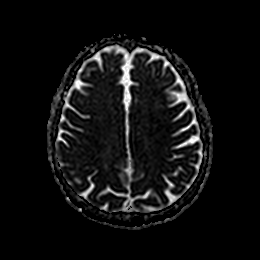
[im 44/44]
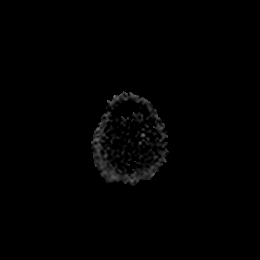

[Series 15: DWI · coronal · 4.0mm · 0.88mm/px · 5 of 60 slices shown (3 of 4)]
[im 1/60]
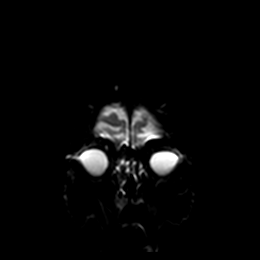
[im 15/60]
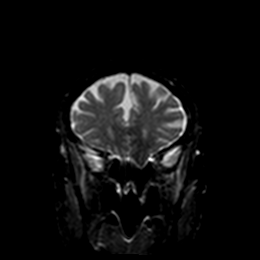
[im 30/60]
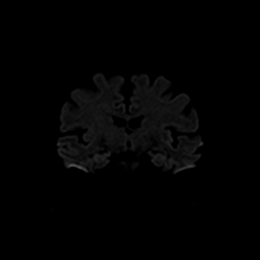
[im 45/60]
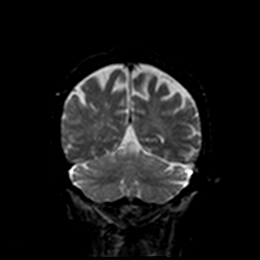
[im 60/60]
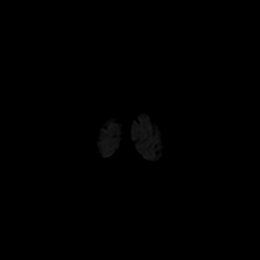

[Series 16: DWI · coronal · 4.0mm · 0.88mm/px · 2 of 28 slices shown (4 of 4)]
[im 1/28]
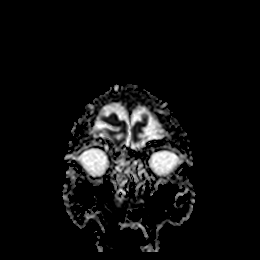
[im 28/28]
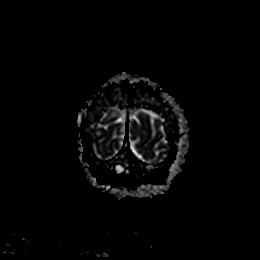

[Series 17: T1 · sagittal · 5.0mm · 0.75mm/px · 2 of 24 slices shown]
[im 1/24]
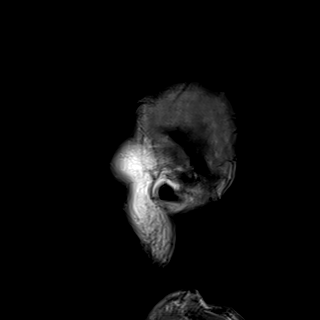
[im 24/24]
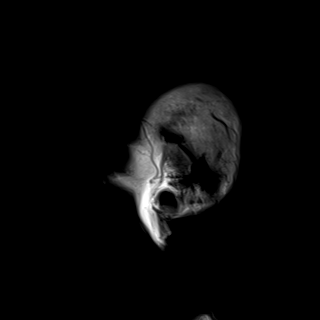

[Series 18: T2 · axial · 5.0mm · 0.72mm/px · z∈[-118,+23]mm · 2 of 25 slices shown (1 of 2)]
[im 1/25]
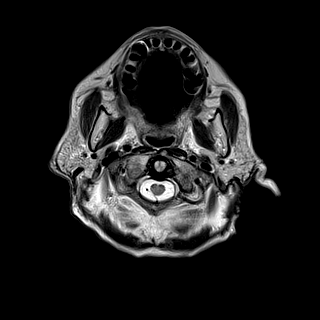
[im 25/25]
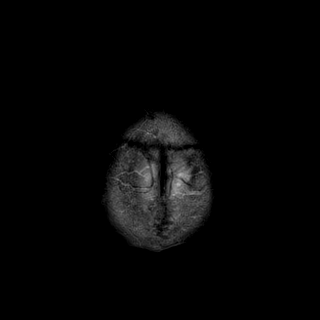

[Series 19: FLAIR · axial · 5.0mm · 0.45mm/px · z∈[-117,+24]mm · 2 of 25 slices shown]
[im 1/25]
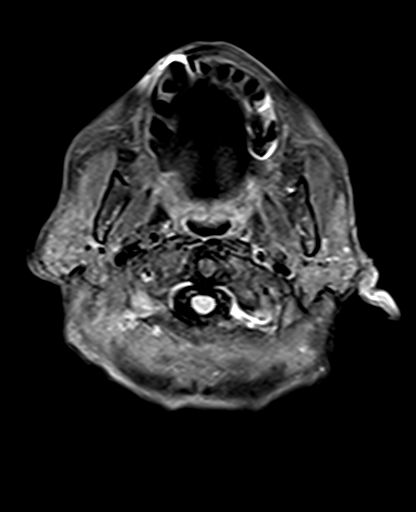
[im 25/25]
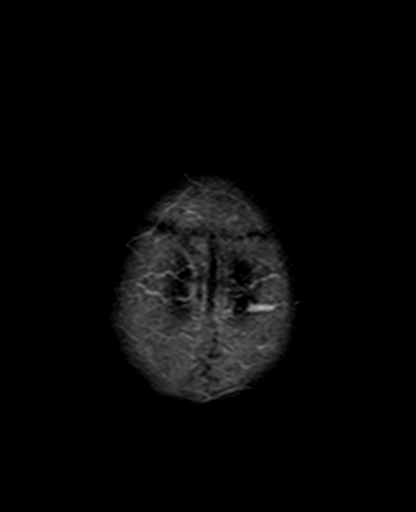

[Series 20: mag_images · axial · 3.0mm · 0.90mm/px · z∈[-109,+29]mm · 4 of 48 slices shown]
[im 1/48]
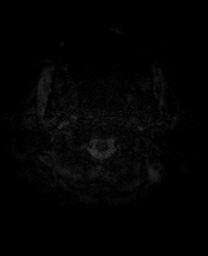
[im 16/48]
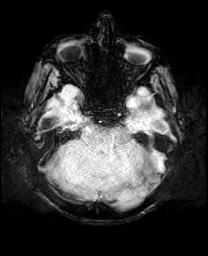
[im 32/48]
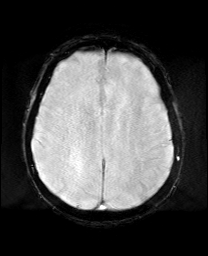
[im 48/48]
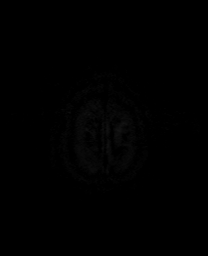

[Series 21: pha_images · axial · 3.0mm · 0.90mm/px · z∈[-109,+29]mm · 4 of 48 slices shown]
[im 1/48]
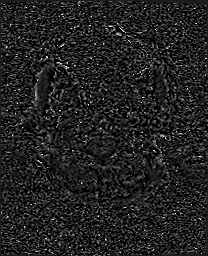
[im 16/48]
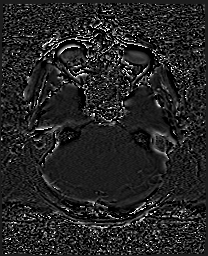
[im 32/48]
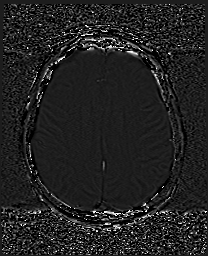
[im 48/48]
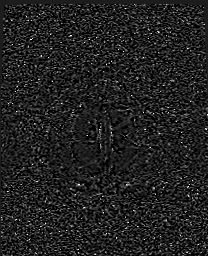

[Series 22: swi_images · axial · 3.0mm · 0.90mm/px · z∈[-109,+29]mm · 4 of 48 slices shown]
[im 1/48]
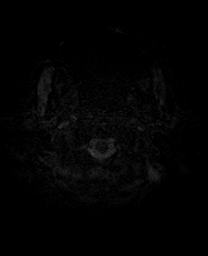
[im 16/48]
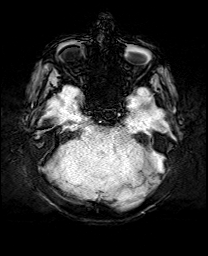
[im 32/48]
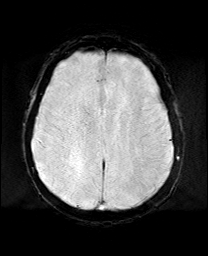
[im 48/48]
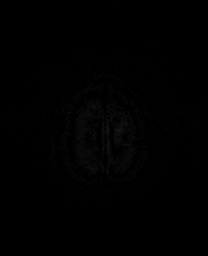

[Series 23: mip_images(sw) · axial · 24.0mm · 0.90mm/px · z∈[-99,+19]mm · 4 of 41 slices shown]
[im 1/41]
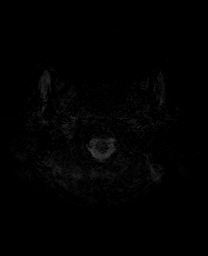
[im 14/41]
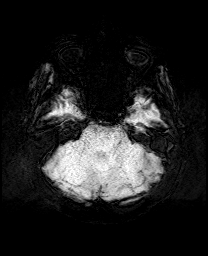
[im 27/41]
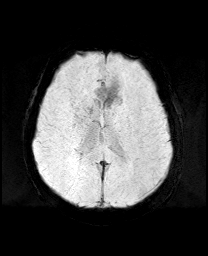
[im 41/41]
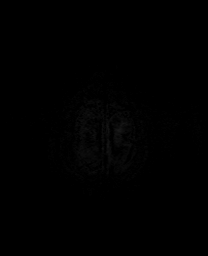

[Series 25: T2 · coronal · 5.0mm · 0.34mm/px · 2 of 27 slices shown (2 of 2)]
[im 1/27]
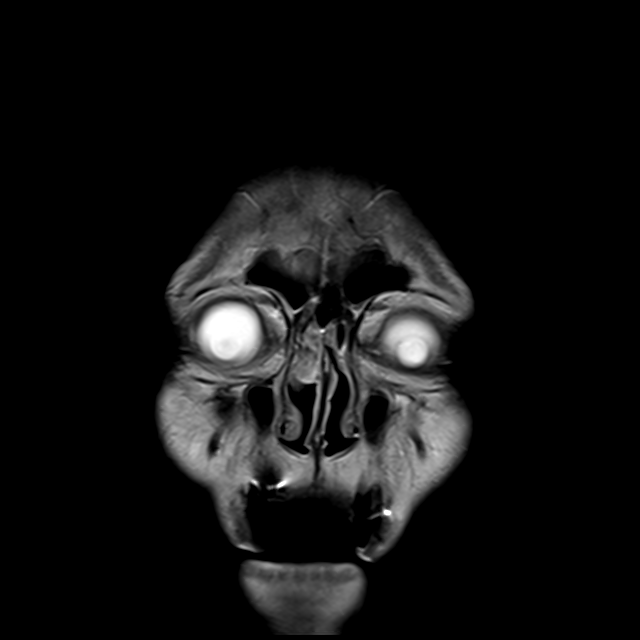
[im 27/27]
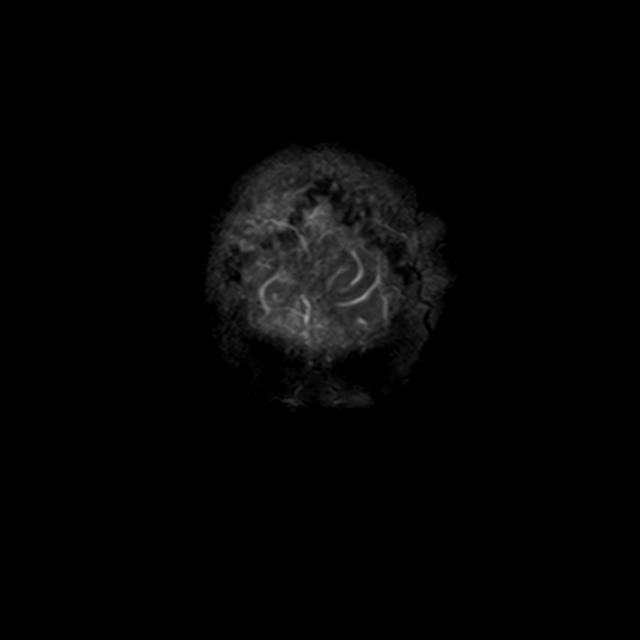

[44 of 48 positions shown; findings below may reference images not displayed]

FINDINGS: Brain: No restricted diffusion to suggest acute infarction. No acute
hemorrhage, mass, mass effect, or midline shift. No hydrocephalus or
extra-axial collection. Ventricles and sulci are within normal
limits for age.

Vascular: Normal flow voids.

Skull and upper cervical spine: Normal marrow signal.

Sinuses/Orbits: Postoperative changes in the sinuses, with mucosal
thickening in the ethmoid air cells. Status post bilateral lens
replacements.

Other: Trace fluid in bilateral mastoid air cells.
IMPRESSION: No acute intracranial process.

## 2020-12-15 MED ORDER — MAGIC MOUTHWASH
10.0000 mL | Freq: Four times a day (QID) | ORAL | Status: DC | PRN
  Administered 2020-12-16: 10 mL via ORAL
  Filled 2020-12-15 (×2): qty 10

## 2020-12-15 MED ORDER — TRIAMCINOLONE ACETONIDE 55 MCG/ACT NA AERO: 2.0000 | INHALATION_SPRAY | Freq: Every day | NASAL | Status: DC | PRN

## 2020-12-15 MED ORDER — ENSURE ENLIVE PO LIQD
237.0000 mL | Freq: Two times a day (BID) | ORAL | Status: DC
  Administered 2020-12-16 – 2020-12-22 (×10): 237 mL via ORAL
  Filled 2020-12-15: qty 237

## 2020-12-15 MED ORDER — ALBUMIN HUMAN 25 % IV SOLN: 50.0000 g | Freq: Once | INTRAVENOUS | Status: DC

## 2020-12-15 MED ORDER — MUSCLE RUB 10-15 % EX CREA
TOPICAL_CREAM | CUTANEOUS | Status: DC | PRN
  Filled 2020-12-15: qty 85

## 2020-12-15 MED ORDER — PANCRELIPASE (LIP-PROT-AMYL) 36000-114000 UNITS PO CPEP
36000.0000 [IU] | ORAL_CAPSULE | Freq: Three times a day (TID) | ORAL | Status: DC
  Administered 2020-12-16 – 2020-12-22 (×20): 36000 [IU] via ORAL
  Filled 2020-12-15 (×21): qty 1

## 2020-12-15 MED ORDER — MAGNESIUM SULFATE 2 GM/50ML IV SOLN
2.0000 g | Freq: Once | INTRAVENOUS | Status: AC
  Administered 2020-12-15: 2 g via INTRAVENOUS
  Filled 2020-12-15: qty 50

## 2020-12-15 MED ORDER — SODIUM CHLORIDE 0.9% IV SOLUTION: Freq: Once | INTRAVENOUS | Status: AC

## 2020-12-15 MED ORDER — TRAMADOL HCL 50 MG PO TABS
50.0000 mg | ORAL_TABLET | Freq: Four times a day (QID) | ORAL | Status: DC | PRN
  Administered 2020-12-15 – 2020-12-21 (×9): 50 mg via ORAL
  Filled 2020-12-15 (×9): qty 1

## 2020-12-15 MED ORDER — RISAQUAD PO CAPS
1.0000 | ORAL_CAPSULE | Freq: Every day | ORAL | Status: DC
  Administered 2020-12-16 – 2020-12-22 (×7): 1 via ORAL
  Filled 2020-12-15 (×8): qty 1

## 2020-12-15 MED ORDER — LORATADINE 10 MG PO TABS
10.0000 mg | ORAL_TABLET | Freq: Every day | ORAL | Status: DC
  Administered 2020-12-15 – 2020-12-22 (×8): 10 mg via ORAL
  Filled 2020-12-15 (×8): qty 1

## 2020-12-15 MED ORDER — WHITE PETROLATUM EX OINT
TOPICAL_OINTMENT | CUTANEOUS | Status: DC | PRN
  Administered 2020-12-16: 0.2 via TOPICAL
  Filled 2020-12-15: qty 28.35

## 2020-12-15 MED ORDER — CHLORHEXIDINE GLUCONATE CLOTH 2 % EX PADS
6.0000 | MEDICATED_PAD | Freq: Every day | CUTANEOUS | Status: DC
  Administered 2020-12-15 – 2020-12-22 (×8): 6 via TOPICAL

## 2020-12-15 NOTE — Progress Notes (Addendum)
PROGRESS NOTE    Nancy Hawkins   NGE:[id number]  DOB: 07-May-1940  PCP: Lilian Coma., MD    DOA: 12/14/2020 LOS: 1    Brief Narrative / Hospital Course to Date:   Nancy Hawkins is a pleasant 80 y.o. female with medical history significant for pancreatic cancer on chemotherapy, remote atrial fibrillation, bilateral lower extremity DVTs on Lovenox, recent GI bleeding, insulin-dependent diabetes mellitus, and history of hypertension with antihypertensive stopped during recent admission for low blood pressures, presented to the ED on 12/14/20 with lethargy, confusion, and anorexia.  Daughter reported progressive generalized weakness and fatigue since getting chemotherapy on 12/10/2020.  Too weak to even ambulate.  Nancy Hawkins seen at the cancer center and directed to the ED for further evaluation.     In the ED, patient afebrile, in A-fib RVR with HR as high as 130s, and BP in the 40N to 02V systolic.   Head CT negative for acute intracranial abnormality.  Chest x-ray with mild progression in right hemidiaphragm elevation with mild right basilar atelectasis and question of small right pleural effusion.   Labs notable for sodium 126, and elevated BUN to creatinine ratio.  Blood cultures were collected in the emergency department, given 2.5 L LR, and started on vancomycin, Azactam, and Flagyl.   Admitted to [facility] service.  Assessment & Plan   Principal Problem:   Acute encephalopathy Active Problems:   Primary cancer of head of pancreas (HCC)   DMII (diabetes mellitus, type 2) (HCC)   Paroxysmal atrial fibrillation with RVR (HCC)   DVT of lower extremity, bilateral (HCC)   Hyponatremia   Thrombocytopenia (HCC)   SIRS (systemic inflammatory response syndrome) (HCC)   AKI (acute kidney injury) ([facility])   Anasarca   Acute metabolic encephalopathy -likely multifactorial due to poor p.o. intake possibly infection.  Head CT negative, ammonia level normal, no focal neurologic deficits. Patient presented  lethargic. -- Continue empiric antibiotics, IV fluids --Supportive care --Monitor replace electrolytes --Delirium precautions  Hypotension, lactic acidosis -suspect both due to dehydration given her poor p.o. intake, anorexia. Presented with BP systolic 25D-66Y, lactic acid 2.6. BP improved with fluid resuscitation and IV albumin on admission.  No obvious source of infection initially but UA with large leukocytes and pyuria -- Follow blood cultures --Urine culture pending --Continue empiric antibiotics --Trend lactic acid --Currently off IV fluids, resume if BP trending down, maintain MAP above 65  A. fib with RVR -with heart rates 110s-130s in the ED. Rates improved after IV fluid resuscitation.  Patient reports remote history of A. fib episode years ago. This episode likely triggered by dehydration and hypovolemia 11/8: Heart rate on monitor in the lower 100s occasionally 110's. -- Start amiodarone or Cardizem drip if BP will tolerate if HR sustaining 120's or above --Consider cardiology consult if persistent --Treat underlying condition/s as outlined  Hyponatremia - Na 126 on admission, down from 132 on 12/09/20.  Suspect due to anorexia and poor PO intake. Slightly improved with IV fluids. --Follow BMP's --Due to profound edema, hold off additional fluids for now --Encourage PO intake  Hypomagnesemia - replaced with 2g IV Mg. Monitor and replace as needed, target Mg>2 given Afib RVR.  Anemia / Thrombocytopenia - likely due to chemotherapy.  Initial Hbg was 8.5, later 6.2 (possibly dilution with IVF's, no overt signs of bleeding).  Transfused 1 unit pRBC's, Hbg improved to 7.6. --Follow CBC --Transfuse pRBC's if Hbg < 7 --Avoid heparin products and antiplatelets --Transfuse platelets if bleeding with Plt <  50k or if Plt < 10k --Oncology consulted  Anasarca / Hypoalbuminemia - Albumin 1.8.  Given IV albumin on admission.    DVT - Lovenox on hold due to  thrombocytopenia. --SCD's for prophylaxis  Pancreatic cancer - on chemotherapy.  Diagnosed in December 2021.  Status post SBRT and modified FOLFIRINOX. Had cycle 1 of gemcitabine and Abraxane on 12/10/20. --Continue Creon --Continue home Tramadol PRN --Oncology following  Oral ulcers - due to chemo.  On magic mouthwash and topical vaseline  Insulin-dependent DM - insulin coverage and CBG's. Adjust insulin for inpatient goal 140-180  Gastritis - seen on EGD 12/04/2020.   --Continue PPI and Carafate     Patient BMI: Body mass index is 23.94 kg/m.   DVT prophylaxis: SCD's due to thrombocytopenia   Diet:  Diet Orders (From admission, onward)     Start     Ordered   12/15/20 1115  Diet Carb Modified Fluid consistency: Thin; Room service appropriate? Yes  Diet effective now       Question Answer Comment  Diet-HS Snack? Nothing   Calorie Level Medium 1600-2000   Fluid consistency: Thin   Room service appropriate? Yes      12/15/20 1114              Code Status: DNR   Subjective 12/15/20    Nancy Hawkins seen in the ED holding for a bed.  Reports feeling a little better this morning.  Denies SOB, CP, F/C.  Has some abdominal pain without N/V.  Mild diarrhea prior to admission.  Increased swelling in legs.  No other acute complaints.    Disposition Plan & Communication   Status is: Inpatient  Remains inpatient appropriate because: severity of illness as outlined above.   Consults, Procedures, Significant Events   Consultants:  Oncology  Procedures:  none  Antimicrobials:  Anti-infectives (From admission, onward)    Start     Dose/Rate Route Frequency Ordered Stop   12/15/20 1700  vancomycin (VANCOREADY) IVPB 1000 mg/200 mL        1,000 mg 200 mL/hr over 60 Minutes Intravenous Every 24 hours 12/14/20 1616     12/15/20 0800  metroNIDAZOLE (FLAGYL) IVPB 500 mg        500 mg 100 mL/hr over 60 Minutes Intravenous Every 12 hours 12/14/20 2225 12/22/20 0759   12/14/20  2200  aztreonam (AZACTAM) 2 g in sodium chloride 0.9 % 100 mL IVPB        2 g 200 mL/hr over 30 Minutes Intravenous Every 8 hours 12/14/20 1616     12/14/20 1630  vancomycin (VANCOREADY) IVPB 1000 mg/200 mL        1,000 mg 200 mL/hr over 60 Minutes Intravenous  Once 12/14/20 1548 12/14/20 1814   12/14/20 1600  aztreonam (AZACTAM) 2 g in sodium chloride 0.9 % 100 mL IVPB        2 g 200 mL/hr over 30 Minutes Intravenous  Once 12/14/20 1548 12/14/20 2034   12/14/20 1600  metroNIDAZOLE (FLAGYL) IVPB 500 mg        500 mg 100 mL/hr over 60 Minutes Intravenous  Once 12/14/20 1548 12/14/20 2200         Micro    Objective   Vitals:   12/15/20 1252 12/15/20 1328 12/15/20 1353 12/15/20 1618  BP:  (!) 107/54 (!) 108/49 106/67  Pulse:  (!) 121 (!) 110 92  Resp:   17 20  Temp: 98 F (36.7 C) (!) 97.4 F (36.3 C)  (!)  97.3 F (36.3 C)  TempSrc: Oral Oral  Oral  SpO2:  99% 96% 97%  Weight:      Height:        Intake/Output Summary (Last 24 hours) at 12/15/2020 1735 Last data filed at 12/15/2020 1245 Gross per 24 hour  Intake 1362.74 ml  Output 300 ml  Net 1062.74 ml   Filed Weights   12/15/20 0419  Weight: 61.3 kg    Physical Exam:  General exam: awake, alert, no acute distress HEENT: moist mucus membranes, hearing grossly normal  Respiratory system: CTAB, no wheezes, rales or rhonchi, normal respiratory effort. Cardiovascular system: normal S1/S2, irregularly irregular 3+ pitting lower extremity edema.   Gastrointestinal system: soft, mild epigastric tenderness Central nervous system: A&O x4. no gross focal neurologic deficits, normal speech Skin: dry, intact, normal temperature Psychiatry: normal mood, congruent affect, judgement and insight appear normal  Labs   Data Reviewed: I have personally reviewed following labs and imaging studies  CBC: Recent Labs  Lab 12/09/20 0423 12/14/20 1340 12/14/20 1635 12/14/20 1810 12/15/20 0235 12/15/20 0945  WBC 6.0 9.4 6.6   --  3.5* 3.0*  NEUTROABS  --  9.0*  --   --   --   --   HGB 8.1* 7.9* 8.5* 9.2* 6.2* 7.6*  HCT 25.3* 23.7* 25.7* 27.0* 18.0* 22.4*  MCV 88.8 81.7 85.1  --  82.9 82.7  PLT 132* 79* 63*  --  41* 37*   Basic Metabolic Panel: Recent Labs  Lab 12/09/20 0423 12/14/20 1340 12/14/20 1635 12/14/20 1810 12/15/20 0235  NA 132* 127* 126* 126* 128*  K 3.7 3.8 4.0 4.1 3.7  CL 104 97* 98  --  101  CO2 22 19* 18*  --  19*  GLUCOSE 145* 186* 193*  --  168*  BUN 8 30* 28*  --  26*  CREATININE 0.32* 0.53 0.66  --  0.53  CALCIUM 7.5* 7.3* 7.5*  --  7.3*  MG 1.9  --   --   --  1.6*   GFR: Estimated Creatinine Clearance: 46.4 mL/min (by C-G formula based on SCr of 0.53 mg/dL). Liver Function Tests: Recent Labs  Lab 12/14/20 1340 12/15/20 0235  AST 32 42*  ALT 26 26  ALKPHOS 213* 169*  BILITOT 2.8* 2.8*  PROT 4.9* 4.1*  ALBUMIN 1.9* 1.8*   No results for input(s): LIPASE, AMYLASE in the last 168 hours. Recent Labs  Lab 12/14/20 1340 12/14/20 1635  AMMONIA 84* 32   Coagulation Profile: Recent Labs  Lab 12/14/20 1635  INR 1.5*   Cardiac Enzymes: No results for input(s): CKTOTAL, CKMB, CKMBINDEX, TROPONINI in the last 168 hours. BNP (last 3 results) No results for input(s): PROBNP in the last 8760 hours. HbA1C: No results for input(s): HGBA1C in the last 72 hours. CBG: Recent Labs  Lab 12/15/20 0230 12/15/20 0747 12/15/20 1127 12/15/20 1333 12/15/20 1622  GLUCAP 165* 156* 146* 129* 146*   Lipid Profile: No results for input(s): CHOL, HDL, LDLCALC, TRIG, CHOLHDL, LDLDIRECT in the last 72 hours. Thyroid Function Tests: Recent Labs    12/15/20 0235  TSH 0.924   Anemia Panel: No results for input(s): VITAMINB12, FOLATE, FERRITIN, TIBC, IRON, RETICCTPCT in the last 72 hours. Sepsis Labs: Recent Labs  Lab 12/14/20 1635 12/14/20 2000 12/15/20 0235  LATICACIDVEN 2.6* 2.7* 2.1*    Recent Results (from the past 240 hour(s))  Resp Panel by RT-PCR (Flu A&B, Covid)  Nasopharyngeal Swab     Status: None  Collection Time: 12/14/20  3:44 PM   Specimen: Nasopharyngeal Swab; Nasopharyngeal(NP) swabs in vial transport medium  Result Value Ref Range Status   SARS Coronavirus 2 by RT PCR NEGATIVE NEGATIVE Final    Comment: (NOTE) SARS-CoV-2 target nucleic acids are NOT DETECTED.  The SARS-CoV-2 RNA is generally detectable in upper respiratory specimens during the acute phase of infection. The lowest concentration of SARS-CoV-2 viral copies this assay can detect is 138 copies/mL. A negative result does not preclude SARS-Cov-2 infection and should not be used as the sole basis for treatment or other patient management decisions. A negative result may occur with  improper specimen collection/handling, submission of specimen other than nasopharyngeal swab, presence of viral mutation(s) within the areas targeted by this assay, and inadequate number of viral copies(<138 copies/mL). A negative result must be combined with clinical observations, patient history, and epidemiological information. The expected result is Negative.  Fact Sheet for Patients:  EntrepreneurPulse.com.au  Fact Sheet for Healthcare Providers:  IncredibleEmployment.be  This test is no t yet approved or cleared by the [place] FDA and  has been authorized for detection and/or diagnosis of SARS-CoV-2 by FDA under an Emergency Use Authorization (EUA). This EUA will remain  in effect (meaning this test can be used) for the duration of the COVID-19 declaration under Section 564(b)(1) of the Act, 21 U.S.C.section 360bbb-3(b)(1), unless the authorization is terminated  or revoked sooner.       Influenza A by PCR NEGATIVE NEGATIVE Final   Influenza B by PCR NEGATIVE NEGATIVE Final    Comment: (NOTE) The Xpert Xpress SARS-CoV-2/FLU/RSV plus assay is intended as an aid in the diagnosis of influenza from Nasopharyngeal swab specimens and should not be  used as a sole basis for treatment. Nasal washings and aspirates are unacceptable for Xpert Xpress SARS-CoV-2/FLU/RSV testing.  Fact Sheet for Patients: EntrepreneurPulse.com.au  Fact Sheet for Healthcare Providers: IncredibleEmployment.be  This test is not yet approved or cleared by the [place] FDA and has been authorized for detection and/or diagnosis of SARS-CoV-2 by FDA under an Emergency Use Authorization (EUA). This EUA will remain in effect (meaning this test can be used) for the duration of the COVID-19 declaration under Section 564(b)(1) of the Act, 21 U.S.C. section 360bbb-3(b)(1), unless the authorization is terminated or revoked.  Performed at [facility], [place] [address] [place], [place] [address]   Blood culture (routine x 2)     Status: None (Preliminary result)   Collection Time: 12/14/20  4:30 PM   Specimen: BLOOD  Result Value Ref Range Status   Specimen Description BLOOD RIGHT ANTECUBITAL  Final   Special Requests   Final    BOTTLES DRAWN AEROBIC AND ANAEROBIC Blood Culture adequate volume   Culture   Final    NO GROWTH < 24 HOURS Performed at [facility], [place] [address] [place], [place] [address]    Report Status PENDING  Incomplete  Blood culture (routine x 2)     Status: None (Preliminary result)   Collection Time: 12/14/20  4:35 PM   Specimen: BLOOD  Result Value Ref Range Status   Specimen Description BLOOD RIGHT ANTECUBITAL  Final   Special Requests   Final    BOTTLES DRAWN AEROBIC AND ANAEROBIC Blood Culture adequate volume   Culture   Final    NO GROWTH < 24 HOURS Performed at [facility], [place] [address] [place], [place] [address]    Report Status PENDING  Incomplete  Imaging Studies   DG Chest 2 View  Result Date: 12/14/2020 CLINICAL DATA:  Weakness. Slurred speech. Lethargy. Currently undergoing chemotherapy for pancreatic cancer. EXAM: CHEST - 2 VIEW COMPARISON:   03/11/2020 FINDINGS: Normal sized heart. Mildly progressive elevation of the right hemidiaphragm with mild right basilar atelectasis and possible small right pleural effusion. Stable right subclavian porta catheter with its tip in the proximal superior vena cava. Diffuse osteopenia. Upper abdominal surgical clips and biliary stent. IMPRESSION: Mildly progressive elevation of the right hemidiaphragm with mild right basilar atelectasis and possible small right pleural effusion. Electronically Signed   By: Claudie Revering M.D.   On: 12/14/2020 15:59   CT Head Wo Contrast  Result Date: 12/14/2020 CLINICAL DATA:  Neuro deficit, acute, stroke suspected EXAM: CT HEAD WITHOUT CONTRAST TECHNIQUE: Contiguous axial images were obtained from the base of the skull through the vertex without intravenous contrast. COMPARISON:  CT 03/05/2020. FINDINGS: Brain: No evidence of acute intracranial hemorrhage or extra-axial collection.No evidence of mass lesion/concern mass effect.The ventricles are normal in size.Scattered subcortical and periventricular white matter hypodensities, nonspecific but likely sequela of chronic small vessel ischemic disease. Vascular: No hyperdense vessel or unexpected calcification. Skull: Normal. Negative for fracture or focal lesion. Sinuses/Orbits: No acute finding. Other: None. IMPRESSION: No acute intracranial abnormality. Mild chronic small vessel ischemic changes. Electronically Signed   By: Maurine Simmering M.D.   On: 12/14/2020 16:11     Medications   Scheduled Meds:  Chlorhexidine Gluconate Cloth  6 each Topical Daily   [START ON 12/16/2020] feeding supplement  237 mL Oral BID BM   insulin aspart  0-9 Units Subcutaneous Q4H   pantoprazole  40 mg Oral Daily   sodium chloride flush  3 mL Intravenous Q12H   sucralfate  1 g Oral BID WC   Continuous Infusions:  aztreonam Stopped (12/15/20 1013)   metronidazole Stopped (12/15/20 0856)   vancomycin         LOS: 1 day    Time spent: 30  minutes    Ezekiel Slocumb, DO [facility]  12/15/2020, 5:35 PM      If 7PM-7AM, please contact night-coverage. How to contact the [facility] Attending or Consulting provider [place] or covering provider during after hours [place], for this patient?    Check the care team in [facility] and look for a) attending/consulting [facility] provider listed and b) the [facility] team listed Log into [email or web address] and use 's universal password to access. If you do not have the password, please contact the hospital operator. Locate the [facility] provider you are looking for under [facility] and page to a number that you can be directly reached. If you still have difficulty reaching the provider, please page the [facility] (Director on Call) for the Hospitalists listed on amion for assistance.

## 2020-12-15 NOTE — ED Notes (Signed)
Provided oral care for pt- pt's mouth and lips very dry. Lips red and cracking. Gave oral gel and lip moisturizer.

## 2020-12-15 NOTE — Progress Notes (Addendum)
IP PROGRESS NOTE  Subjective:   Nancy Hawkins is more alert today.  She reports feeling better.  Objective: Vital signs in last 24 hours: Blood pressure (!) 107/54, pulse (!) 121, temperature (!) 97.4 F (36.3 C), temperature source Oral, resp. rate 16, height 5\' 3"  (1.6 m), weight 135 lb 2.3 oz (61.3 kg), SpO2 99 %.  Intake/Output from previous day: 11/07 0701 - 11/08 0700 In: 1047.7 [I.V.:398.9; IV Piggyback:648.9] Out: 300 [Urine:300]  Physical Exam:  HEENT: The tongue is dry.  Ulcerations at the lips and lateral sides of the tongue, no thrush Lungs: Clear anteriorly Cardiac: Irregular Abdomen: Distended with ascites, nontender Extremities: Pitting edema in the arms and legs Neurologic: Alert and oriented, follows commands, moves all extremities to command  Portacath/PICC-without erythema  Lab Results: Recent Labs    12/15/20 0235 12/15/20 0945  WBC 3.5* 3.0*  HGB 6.2* 7.6*  HCT 18.0* 22.4*  PLT 41* 37*    BMET Recent Labs    12/14/20 1635 12/14/20 1810 12/15/20 0235  NA 126* 126* 128*  K 4.0 4.1 3.7  CL 98  --  101  CO2 18*  --  19*  GLUCOSE 193*  --  168*  BUN 28*  --  26*  CREATININE 0.66  --  0.53  CALCIUM 7.5*  --  7.3*    Lab Results  Component Value Date   CAN199 11,154 (H) 12/01/2020    Studies/Results: DG Chest 2 View  Result Date: 12/14/2020 CLINICAL DATA:  Weakness. Slurred speech. Lethargy. Currently undergoing chemotherapy for pancreatic cancer. EXAM: CHEST - 2 VIEW COMPARISON:  03/11/2020 FINDINGS: Normal sized heart. Mildly progressive elevation of the right hemidiaphragm with mild right basilar atelectasis and possible small right pleural effusion. Stable right subclavian porta catheter with its tip in the proximal superior vena cava. Diffuse osteopenia. Upper abdominal surgical clips and biliary stent. IMPRESSION: Mildly progressive elevation of the right hemidiaphragm with mild right basilar atelectasis and possible small right pleural  effusion. Electronically Signed   By: Claudie Revering M.D.   On: 12/14/2020 15:59   CT Head Wo Contrast  Result Date: 12/14/2020 CLINICAL DATA:  Neuro deficit, acute, stroke suspected EXAM: CT HEAD WITHOUT CONTRAST TECHNIQUE: Contiguous axial images were obtained from the base of the skull through the vertex without intravenous contrast. COMPARISON:  CT 03/05/2020. FINDINGS: Brain: No evidence of acute intracranial hemorrhage or extra-axial collection.No evidence of mass lesion/concern mass effect.The ventricles are normal in size.Scattered subcortical and periventricular white matter hypodensities, nonspecific but likely sequela of chronic small vessel ischemic disease. Vascular: No hyperdense vessel or unexpected calcification. Skull: Normal. Negative for fracture or focal lesion. Sinuses/Orbits: No acute finding. Other: None. IMPRESSION: No acute intracranial abnormality. Mild chronic small vessel ischemic changes. Electronically Signed   By: Maurine Simmering M.D.   On: 12/14/2020 16:11    Medications: I have reviewed the patient's current medications.  Assessment/Plan:  Pancreas cancer, borderline resectable CT abdomen/pelvis-inflammation involving the mid body of the pancreas with peripancreatic fat stranding; possible 2.5 x 2.2 cm low-attenuation mass within the mid body of the pancreas, appears to abut the celiac axis as well as the splenic vein.   MRI 11/18/2019-extensive inflammatory fat stranding about the pancreas with a thinly septated multicystic fluid signal lesion of the anterior pancreatic neck measuring 3.2 x 1.7 cm most consistent with a pancreatic pseudocyst; numerous additional small fluid signal lesions throughout the pancreatic parenchyma, the remaining lesions subcentimeter; central portion of pancreatic duct dilated measuring up to 8 mm without  obvious obstructing lesion identified; trace ascites; trace bilateral pleural effusions and associated atelectasis or consolidation.    Colonoscopy 11/20/2019-5 polyps (resected and retrieved- multiple tubular adenomas, negative for high-grade dysplasia); 2 colonic angioectasias; diverticulosis in the entire examined colon; nonbleeding nonthrombosed external and internal hemorrhoids.   EGD/EUS 11/20/2019-few white nummular lesions in the entire esophagus (esophagus biopsy with no significant pathologic findings, no fungal elements); hematin found in the entire examined stomach; gastritis (stomach biopsy showed gastropathy/gastritis); 14 mm semisessile polyp found in the posterior wall of the gastric antrum (hyperplastic polyp); a lesion was identified in the pancreatic body staged T2 N0 MX.  FNA of the pancreas lesion showed benign reactive/reparative changes, prominent inflammation.   EUS procedure 01/20/2020-masslike region identified in the pancreatic head, stage T2 N0 MX.  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 CTs chest/pelvis 01/29/2020-no evidence of metastatic disease CT abdomen/pelvis 03/05/2020-increase in size of pancreas head/uncinate mass, marked narrowing of the adjacent portal vein, patchy consolidation in the right lower lung, mild intrahepatic and extrahepatic biliary ductal dilatation Cycle 1 FOLFOX 03/23/2020 Cycle 2 FOLFOX 04/06/2020, Emend added for delayed nausea after cycle 1 Cycle 3 FOLFIRINOX 04/20/2020 Cycle 4 FOLFIRINOX 05/04/2020, Udenyca Cycle 5 FOLFIRINOX 05/18/2020, Udenyca Cycle 6 FOLFIRINOX 06/02/2020, Udenyca CTs 06/15/2020- decreased size of pancreatic mass, still with portal vein involvement and extensive upper abdominal collaterals. 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 Cycle 1 gemcitabine/Abraxane  12/10/2020 Pancreatitis October 2021 Diabetes October 2021 Isolated episode of atrial fibrillation approximately 15 years ago, recurrent atrial fibrillation 12/14/2020 Hypertension COVID-19 infection Admission 03/05/2020 with biliary obstruction-ERCP 03/09/2020, biliary obstruction secondary to known pancreatic head mass found in the lower third of the main duct.  The upper third of the main bile duct and left and right hepatic ducts and all intrahepatic branches moderately dilated secondary to the stricture.  Biliary sphincterotomy performed.  Covered metal biliary stent placed into the common bile duct. Port-A-Cath placement 03/11/2020, Dr. [name] Oxaliplatin neuropathy Hospital admission 12/02/2020-GI bleed Bilateral lower extremity DVTs 12/01/2020-Lovenox Lovenox held on hospital admission 12/02/2020 Heparin 12/07/2020 Lovenox resumed Lovenox held 12/15/2020 secondary to thrombocytopenia Esophageal varices Admission 12/14/2020 with altered mental status and dehydration Mouth/lip ulcers-secondary to dehydration versus mucositis from chemotherapy   Nancy Hawkins was admitted yesterday with altered mental status.  She appears improved today.  The etiology of the acute presentation is unclear, though she may have urosepsis.  There was significant pyuria on 12/14/2020.  She has pancytopenia, now at day 7 following cycle 1 gemcitabine/Abraxane.  There is been no report of recurrent gross bleeding.  The anemia is secondary to chemotherapy, chronic disease, and potentially ongoing GI bleeding.  Ms. Mossa has anasarca secondary to hypoalbuminemia.  Recommendations: Continue intravenous antibiotics, follow-up cultures Management of atrial fibrillation per the medical service Vaseline and Magic mouthwash for the oral ulcers Check white cell differential count and platelet count 12/16/2020 Hold Lovenox due to the thrombocytopenia      LOS: 1 day   [name], MD   12/15/2020, 1:37 PM

## 2020-12-15 NOTE — Plan of Care (Signed)
  Problem: Health Behavior/Discharge Planning: Goal: Ability to manage health-related needs will improve Outcome: Not Progressing   Problem: Clinical Measurements: Goal: Ability to maintain clinical measurements within normal limits will improve Outcome: Not Progressing   Problem: Activity: Goal: Risk for activity intolerance will decrease Outcome: Not Progressing   Problem: Nutrition: Goal: Adequate nutrition will be maintained Outcome: Not Progressing   Problem: Coping: Goal: Level of anxiety will decrease Outcome: Not Progressing   Problem: Elimination: Goal: Will not experience complications related to bowel motility Outcome: Not Progressing Goal: Will not experience complications related to urinary retention Outcome: Not Progressing   Problem: Pain Managment: Goal: General experience of comfort will improve Outcome: Not Progressing   Problem: Safety: Goal: Ability to remain free from injury will improve Outcome: Not Progressing   Problem: Skin Integrity: Goal: Risk for impaired skin integrity will decrease Outcome: Not Progressing

## 2020-12-15 NOTE — ED Notes (Signed)
MD notified and responded of hgb of 6.2 and systolic pressures in the 80's.

## 2020-12-16 ENCOUNTER — Encounter: Payer: Self-pay | Admitting: Oncology

## 2020-12-16 DIAGNOSIS — G9340 Encephalopathy, unspecified: Secondary | ICD-10-CM | POA: Diagnosis not present

## 2020-12-16 LAB — COMPREHENSIVE METABOLIC PANEL
ALT: 31 U/L (ref 0–44)
AST: 55 U/L — ABNORMAL HIGH (ref 15–41)
Albumin: 1.6 g/dL — ABNORMAL LOW (ref 3.5–5.0)
Alkaline Phosphatase: 186 U/L — ABNORMAL HIGH (ref 38–126)
Anion gap: 7 (ref 5–15)
BUN: 21 mg/dL (ref 8–23)
CO2: 20 mmol/L — ABNORMAL LOW (ref 22–32)
Calcium: 7 mg/dL — ABNORMAL LOW (ref 8.9–10.3)
Chloride: 102 mmol/L (ref 98–111)
Creatinine, Ser: 0.49 mg/dL (ref 0.44–1.00)
GFR, Estimated: >60 mL/min (ref >60)
Glucose, Bld: 123 mg/dL — ABNORMAL HIGH (ref 70–99)
Potassium: 3.2 mmol/L — ABNORMAL LOW (ref 3.5–5.1)
Sodium: 129 mmol/L — ABNORMAL LOW (ref 135–145)
Total Bilirubin: 2.9 mg/dL — ABNORMAL HIGH (ref 0.3–1.2)
Total Protein: 3.9 g/dL — ABNORMAL LOW (ref 6.5–8.1)

## 2020-12-16 LAB — GLUCOSE, CAPILLARY
Glucose-Capillary: 94 mg/dL (ref 70–99)
Glucose-Capillary: 95 mg/dL (ref 70–99)
Glucose-Capillary: 124 mg/dL — ABNORMAL HIGH (ref 70–99)
Glucose-Capillary: 166 mg/dL — ABNORMAL HIGH (ref 70–99)
Glucose-Capillary: 174 mg/dL — ABNORMAL HIGH (ref 70–99)

## 2020-12-16 LAB — TYPE AND SCREEN
ABO/RH(D): A POS
Antibody Screen: NEGATIVE
Unit division: 0

## 2020-12-16 LAB — BPAM RBC
Blood Product Expiration Date: 202211212359
ISSUE DATE / TIME: 202211080402
Unit Type and Rh: 6200

## 2020-12-16 LAB — MAGNESIUM: Magnesium: 1.8 mg/dL (ref 1.7–2.4)

## 2020-12-16 LAB — LACTIC ACID, PLASMA: Lactic Acid, Venous: 2 mmol/L (ref 0.5–1.9)

## 2020-12-16 MED ORDER — METOPROLOL SUCCINATE ER 25 MG PO TB24
12.5000 mg | ORAL_TABLET | Freq: Every day | ORAL | Status: DC
  Administered 2020-12-17 – 2020-12-22 (×6): 12.5 mg via ORAL
  Filled 2020-12-16 (×7): qty 1

## 2020-12-16 MED ORDER — LACTATED RINGERS IV SOLN: INTRAVENOUS | Status: DC

## 2020-12-16 MED ORDER — MIDODRINE HCL 5 MG PO TABS: 5.0000 mg | ORAL_TABLET | Freq: Two times a day (BID) | ORAL | Status: DC

## 2020-12-16 MED ORDER — MIDODRINE HCL 5 MG PO TABS
5.0000 mg | ORAL_TABLET | Freq: Two times a day (BID) | ORAL | Status: DC
  Administered 2020-12-16 – 2020-12-18 (×5): 5 mg via ORAL
  Filled 2020-12-16 (×5): qty 1

## 2020-12-16 MED ORDER — POTASSIUM CHLORIDE CRYS ER 20 MEQ PO TBCR
40.0000 meq | EXTENDED_RELEASE_TABLET | Freq: Once | ORAL | Status: AC
  Administered 2020-12-16: 40 meq via ORAL
  Filled 2020-12-16: qty 2

## 2020-12-16 MED ORDER — MAGNESIUM SULFATE 2 GM/50ML IV SOLN
2.0000 g | Freq: Once | INTRAVENOUS | Status: AC
  Administered 2020-12-16: 2 g via INTRAVENOUS
  Filled 2020-12-16: qty 50

## 2020-12-16 MED ORDER — VANCOMYCIN HCL 1500 MG/300ML IV SOLN
1500.0000 mg | INTRAVENOUS | Status: DC
  Filled 2020-12-16: qty 300

## 2020-12-16 MED ORDER — LACTATED RINGERS IV BOLUS
250.0000 mL | Freq: Once | INTRAVENOUS | Status: AC
Start: 2020-12-16 — End: 2020-12-16
  Administered 2020-12-16: 250 mL via INTRAVENOUS

## 2020-12-16 MED ORDER — LACTATED RINGERS IV BOLUS
500.0000 mL | Freq: Once | INTRAVENOUS | Status: AC
  Administered 2020-12-16: 500 mL via INTRAVENOUS

## 2020-12-16 NOTE — Progress Notes (Addendum)
   12/16/20 0419  Provider Notification  Provider Name/Title Donia Pounds  Date Provider Notified 12/16/20  Time Provider Notified [phone]  Notification Type  (Secure Chat)  Notification Reason Critical result  Test performed and critical result Platelets  Date Critical Result Received 12/16/20  Time Critical Result Received 0415  Provider response No new orders  Date of Provider Response 12/16/20  Time of Provider Response 0420  New orders noted to address K+ 3.2 and magnesium level.

## 2020-12-16 NOTE — Progress Notes (Signed)
   12/16/20 2000  Assess: MEWS Score  Temp 98.2 F (36.8 C)  BP (!) 77/45  Pulse Rate (!) 112  ECG Heart Rate (!) 115  Resp 10  SpO2 98 %  O2 Device Room Air  Assess: MEWS Score  MEWS Temp 0  MEWS Systolic 2  MEWS Pulse 2  MEWS RR 1  MEWS LOC 0  MEWS Score 5  MEWS Score Color Red  Assess: if the MEWS score is Yellow or Red  Were vital signs taken at a resting state? Yes  Focused Assessment No change from prior assessment  Early Detection of Sepsis Score *See Row Information* High  MEWS guidelines implemented *See Row Information* Yes  Treat  MEWS Interventions Other (Comment) (paged on call provider)  Pain Scale 0-10  Pain Score 0  Take Vital Signs  Increase Vital Sign Frequency  Red: Q 1hr X 4 then Q 4hr X 4, if remains red, continue Q 4hrs  Escalate  MEWS: Escalate Red: discuss with charge nurse/RN and provider, consider discussing with RRT  Notify: Charge Nurse/RN  Name of Charge Nurse/RN Notified Paige,RN  Date Charge Nurse/RN Notified 12/16/20  Time Charge Nurse/RN Notified 2020  Notify: Provider  Provider Name/Title Donia Pounds  Date Provider Notified 12/16/20  Time Provider Notified 2130  Notification Type Page (secure chat)  Notification Reason Change in status  Provider response See new orders  Date of Provider Response 12/16/20  Time of Provider Response 2125

## 2020-12-16 NOTE — Progress Notes (Signed)
Pharmacy Antibiotic Note  Nancy Hawkins is a 79 y.o. female admitted on 12/14/2020 with sepsis.  Pharmacy has been consulted for Aztreonam and vancomycin dosing.  Of note, patient has a h/o pancreatic cancer on chemotherapy with gemcitabine/Abraxane s/p one cycle. She was noted to be weak and confused and sent to ED. WBC wnl, hypothermic with soft BP. SCr low  Scr decreased to 0.5, adjust vancomycin dosing for AUC of 400-550  Plan: -Start Aztreonam 2 gm IV Q 8 hours -Vancomycin 1500 mg IV Q 24 hrs. Goal AUC 400-550. Expected AUC: 443 SCr used: 0.5  -Monitor CBC, renal fx, cultures and clinical progress -Vancomycin levels as indicated    Height: 5\' 3"  (160 cm) Weight: 71.2 kg (156 lb 15.5 oz) IBW/kg (Calculated) : 52.4  Temp (24hrs), Avg:97.6 F (36.4 C), Min:97.1 F (36.2 C), Max:98.4 F (36.9 C)  Recent Labs  Lab 12/14/20 1340 12/14/20 1635 12/14/20 2000 12/15/20 0235 12/15/20 0945 12/16/20 0256  WBC 9.4 6.6  --  3.5* 3.0* 1.8*  CREATININE 0.53 0.66  --  0.53  --  0.49  LATICACIDVEN  --  2.6* 2.7* 2.1*  --  2.0*     Estimated Creatinine Clearance: 53 mL/min (by C-G formula based on SCr of 0.49 mg/dL).    Allergies  Allergen Reactions   Cephalosporins Shortness Of Breath   Erythromycin Shortness Of Breath   Fire Ant Shortness Of Breath   Penicillins Anaphylaxis and Other (See Comments)    "25 years ago"    Antimicrobials this admission: Aztreonam 11/7 >>  Vancomycin 11/7 >>   Dose adjustments this admission:   Microbiology results: 11/7 BCx:    Lacrecia Delval A. Levada Dy, PharmD, BCPS, FNKF Clinical Pharmacist [place] Please utilize Amion for appropriate phone number to reach the unit pharmacist ([place])

## 2020-12-16 NOTE — Progress Notes (Signed)
IP PROGRESS NOTE  Subjective:   Nancy Hawkins feels better again today.  Her mouth is less sore.  No pain.  Objective: Vital signs in last 24 hours: Blood pressure 93/61, pulse (!) 102, temperature (!) 97.2 F (36.2 C), temperature source Oral, resp. rate 15, height 5\' 3"  (1.6 m), weight 156 lb 15.5 oz (71.2 kg), SpO2 98 %.  Intake/Output from previous day: 11/08 0701 - 11/09 0700 In: 549.9 [P.O.:120; Blood:315; IV Piggyback:114.9] Out: 350 [Urine:350]  Physical Exam:  HEENT: Tongue and lip ulcers appear to be healing.  No thrush. Lungs: Decreased breath sounds at the posterior base bilaterally, no respiratory distress Cardiac: Irregular, tachycardia Abdomen: Distended with ascites, nontender Extremities: Pitting edema in the arms and legs Neurologic: Alert and oriented, follows commands  Portacath/PICC-without erythema  Lab Results: Recent Labs    12/15/20 0945 12/16/20 0256  WBC 3.0* 1.8*  HGB 7.6* 7.8*  HCT 22.4* 22.5*  PLT 37* 26*    BMET Recent Labs    12/15/20 0235 12/16/20 0256  NA 128* 129*  K 3.7 3.2*  CL 101 102  CO2 19* 20*  GLUCOSE 168* 123*  BUN 26* 21  CREATININE 0.53 0.49  CALCIUM 7.3* 7.0*    Lab Results  Component Value Date   YYT035 11,154 (H) 12/01/2020    Studies/Results: DG Chest 2 View  Result Date: 12/14/2020 CLINICAL DATA:  Weakness. Slurred speech. Lethargy. Currently undergoing chemotherapy for pancreatic cancer. EXAM: CHEST - 2 VIEW COMPARISON:  03/11/2020 FINDINGS: Normal sized heart. Mildly progressive elevation of the right hemidiaphragm with mild right basilar atelectasis and possible small right pleural effusion. Stable right subclavian porta catheter with its tip in the proximal superior vena cava. Diffuse osteopenia. Upper abdominal surgical clips and biliary stent. IMPRESSION: Mildly progressive elevation of the right hemidiaphragm with mild right basilar atelectasis and possible small right pleural effusion. Electronically  Signed   By: Claudie Revering M.D.   On: 12/14/2020 15:59   CT Head Wo Contrast  Result Date: 12/14/2020 CLINICAL DATA:  Neuro deficit, acute, stroke suspected EXAM: CT HEAD WITHOUT CONTRAST TECHNIQUE: Contiguous axial images were obtained from the base of the skull through the vertex without intravenous contrast. COMPARISON:  CT 03/05/2020. FINDINGS: Brain: No evidence of acute intracranial hemorrhage or extra-axial collection.No evidence of mass lesion/concern mass effect.The ventricles are normal in size.Scattered subcortical and periventricular white matter hypodensities, nonspecific but likely sequela of chronic small vessel ischemic disease. Vascular: No hyperdense vessel or unexpected calcification. Skull: Normal. Negative for fracture or focal lesion. Sinuses/Orbits: No acute finding. Other: None. IMPRESSION: No acute intracranial abnormality. Mild chronic small vessel ischemic changes. Electronically Signed   By: Maurine Simmering M.D.   On: 12/14/2020 16:11   MR BRAIN WO CONTRAST  Result Date: 12/15/2020 CLINICAL DATA:  Stroke suspected EXAM: MRI HEAD WITHOUT CONTRAST TECHNIQUE: Multiplanar, multiecho pulse sequences of the brain and surrounding structures were obtained without intravenous contrast. COMPARISON:  CT head 12/14/2020, no prior MRI FINDINGS: Brain: No restricted diffusion to suggest acute infarction. No acute hemorrhage, mass, mass effect, or midline shift. No hydrocephalus or extra-axial collection. Ventricles and sulci are within normal limits for age. Vascular: Normal flow voids. Skull and upper cervical spine: Normal marrow signal. Sinuses/Orbits: Postoperative changes in the sinuses, with mucosal thickening in the ethmoid air cells. Status post bilateral lens replacements. Other: Trace fluid in bilateral mastoid air cells. IMPRESSION: No acute intracranial process. Electronically Signed   By: Merilyn Baba M.D.   On: 12/15/2020 19:59  Medications: I have reviewed the patient's current  medications.  Assessment/Plan:  Pancreas cancer, borderline resectable CT abdomen/pelvis-inflammation involving the mid body of the pancreas with peripancreatic fat stranding; possible 2.5 x 2.2 cm low-attenuation mass within the mid body of the pancreas, appears to abut the celiac axis as well as the splenic vein.   MRI 11/18/2019-extensive inflammatory fat stranding about the pancreas with a thinly septated multicystic fluid signal lesion of the anterior pancreatic neck measuring 3.2 x 1.7 cm most consistent with a pancreatic pseudocyst; numerous additional small fluid signal lesions throughout the pancreatic parenchyma, the remaining lesions subcentimeter; central portion of pancreatic duct dilated measuring up to 8 mm without obvious obstructing lesion identified; trace ascites; trace bilateral pleural effusions and associated atelectasis or consolidation.   Colonoscopy 11/20/2019-5 polyps (resected and retrieved- multiple tubular adenomas, negative for high-grade dysplasia); 2 colonic angioectasias; diverticulosis in the entire examined colon; nonbleeding nonthrombosed external and internal hemorrhoids.   EGD/EUS 11/20/2019-few white nummular lesions in the entire esophagus (esophagus biopsy with no significant pathologic findings, no fungal elements); hematin found in the entire examined stomach; gastritis (stomach biopsy showed gastropathy/gastritis); 14 mm semisessile polyp found in the posterior wall of the gastric antrum (hyperplastic polyp); a lesion was identified in the pancreatic body staged T2 N0 MX.  FNA of the pancreas lesion showed benign reactive/reparative changes, prominent inflammation.   EUS procedure 01/20/2020-masslike region identified in the pancreatic head, stage T2 N0 MX.  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 CTs chest/pelvis 01/29/2020-no evidence of metastatic disease CT abdomen/pelvis 03/05/2020-increase in size of  pancreas head/uncinate mass, marked narrowing of the adjacent portal vein, patchy consolidation in the right lower lung, mild intrahepatic and extrahepatic biliary ductal dilatation Cycle 1 FOLFOX 03/23/2020 Cycle 2 FOLFOX 04/06/2020, Emend added for delayed nausea after cycle 1 Cycle 3 FOLFIRINOX 04/20/2020 Cycle 4 FOLFIRINOX 05/04/2020, Udenyca Cycle 5 FOLFIRINOX 05/18/2020, Udenyca Cycle 6 FOLFIRINOX 06/02/2020, Udenyca CTs 06/15/2020- decreased size of pancreatic mass, still with portal vein involvement and extensive upper abdominal collaterals. 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 Cycle 1 gemcitabine/Abraxane 12/10/2020 Pancreatitis October 2021 Diabetes October 2021 Isolated episode of atrial fibrillation approximately 15 years ago, recurrent atrial fibrillation 12/14/2020 Hypertension COVID-19 infection Admission 03/05/2020 with biliary obstruction-ERCP 03/09/2020, biliary obstruction secondary to known pancreatic head mass found in the lower third of the main duct.  The upper third of the main bile duct and left and right hepatic ducts and all intrahepatic branches moderately dilated secondary to the stricture.  Biliary sphincterotomy performed.  Covered metal biliary stent placed into the common bile duct. Port-A-Cath placement 03/11/2020, Dr. [name] Oxaliplatin neuropathy Hospital admission 12/02/2020-GI bleed Bilateral lower extremity DVTs 12/01/2020-Lovenox Lovenox held on hospital admission 12/02/2020 Heparin 12/07/2020 Lovenox resumed Lovenox held 12/15/2020 secondary to thrombocytopenia Esophageal varices Admission 12/14/2020 with altered mental status and dehydration Mouth/lip ulcers-secondary to dehydration versus mucositis from chemotherapy   Ms.  Hawkins was admitted with altered mental status.  Her mental status has returned to baseline.  Her clinical presentation may have been related to sepsis.  She had significant pyuria.  No other source of infection has been identified. She has progressive pancytopenia nivolumab D8 following cycle 1 gemcitabine/Abraxane.  The leukopenia and thrombocytopenia are secondary to chemotherapy and possibly sepsis.  She was recently diagnosed with bilateral lower extremity deep vein  thromboses.  I placed Lovenox on hold secondary to the severe thrombocytopenia.   Nancy Hawkins has anasarca secondary to hypoalbuminemia.  Recommendations: Continue intravenous antibiotics, follow-up cultures Management of atrial fibrillation per the medical service Monitor CBC daily, no indication for transfusion therapy today Hold Lovenox due to the thrombocytopenia      LOS: 2 days   [name], MD   12/16/2020, 7:47 AM

## 2020-12-16 NOTE — Progress Notes (Signed)
Per Jamison Neighbor, nursing may access and use port a cath for infusions/IV antibiotics.

## 2020-12-16 NOTE — Progress Notes (Signed)
[facility] night cross cover note:  As requested by my day hospitalist colleague, I followed up on this patient in the setting of concern conveyed to the day hospitalist by day shift RN on the floor that the patient may be exhibiting evidence of "facial paralysis".   The patient was admitted by the night hospitalist on the evening of 12/14/2020 into the morning of 12/15/20 for acute encephalopathy, generalized weakness in the setting of suspected impacts from recent chemotherapy medication.  When the patient was seen by the rounding hospitalist this morning, no acute focal neurologic deficits, including no facial paralysis, were appreciated.  Patient remained boarding in the ED until around 1800 this evening, during which time no facial paralysis noted by ED staff.  Upon arriving on the floor for pcu, inheriting daytime floor RN was concerned for potential facial paralysis, and notified rounding hospitalist of this concern, prompting MRI brain to be ordered. MRI brain subsequently showed no evidence of acute process.  This is in addition to noncontrast CT head that was performed in ED on 12/14/2020 as component of work-up for presenting acute encephalopathy, and demonstrated no evidence of acute intracranial process, including no evidence of intracranial hemorrhage.   I evaluated the patient at bedside, noting no overt facial asymmetry, with cranial nerves appearing to be intact, and no evidence of overt acute focal neurologic deficit.  Also discussed case with the patient's overnight RN, who conveyed that she had not noted any facial asymmetry at any point during the shift.    Babs Bertin, DO Hospitalist

## 2020-12-16 NOTE — Progress Notes (Signed)
   12/16/20 0009  Assess: MEWS Score  Temp 98.4 F (36.9 C)  BP (!) 93/53  Pulse Rate (!) 108  ECG Heart Rate (!) 115  Resp 13  SpO2 97 %  O2 Device Room Air  Assess: MEWS Score  MEWS Temp 0  MEWS Systolic 1  MEWS Pulse 2  MEWS RR 1  MEWS LOC 0  MEWS Score 4  MEWS Score Color Red  Assess: if the MEWS score is Yellow or Red  Were vital signs taken at a resting state? Yes  Focused Assessment No change from prior assessment  Early Detection of Sepsis Score *See Row Information* High  MEWS guidelines implemented *See Row Information* Yes  Treat  MEWS Interventions Administered prn meds/treatments  Pain Scale 0-10  Pain Score Asleep  Take Vital Signs  Increase Vital Sign Frequency  Red: Q 1hr X 4 then Q 4hr X 4, if remains red, continue Q 4hrs  Escalate  MEWS: Escalate Red: discuss with charge nurse/RN and provider, consider discussing with RRT  Notify: Provider  Provider Name/Title Jamison Neighbor  Date Provider Notified 12/16/20  Time Provider Notified 0014  Notification Type Page  Notification Reason Other (Comment) (HYPOTENSION, TACHYCARDIA)  Provider response See new orders  Date of Provider Response 12/16/20  Time of Provider Response [phone]

## 2020-12-16 NOTE — Progress Notes (Signed)
Pt has lost PIV access, IV team has made several attempts with U/S machine to place another one. Pt has been receiving IV fluids and antibiotics via PIV. Jamison Neighbor notified for use of porta cath for IV infusions/medications. Awaiting response.

## 2020-12-16 NOTE — Progress Notes (Signed)
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  Daughter reported progressive generalized weakness and fatigue since getting chemotherapy on 12/10/2020.  Too weak to even ambulate.  Pt seen at the cancer center and directed to the ED for further evaluation.     In the ED, patient afebrile, in A-fib RVR with HR as high as 130s, and BP in the 53G to 99M systolic.   Head CT negative for acute intracranial abnormality.  Chest x-ray with mild progression in right hemidiaphragm elevation with mild right basilar atelectasis and question of small right pleural effusion.   Labs notable for sodium 126, and elevated BUN to creatinine ratio.  UA noted pyuria, blood cultures were collected in the emergency department, given 2.5 L LR, and started on vancomycin, Azactam, and Flagyl.   Admitted to [facility] service.  Assessment & Plan   Acute metabolic encephalopathy - -Likely multifactorial, possibly dehydration,?  UTI -no overt symptoms but pyuria noted on urinalysis  -Head CT negative, ammonia level normal, no focal neurologic deficits. 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  Diagnosed in December 2021.  Status post SBRT and modified FOLFIRINOX. Had cycle 1 of gemcitabine and Abraxane on 12/10/20. --Continue Creon -Oncology following, will discuss prognosis/plan -Overall prognosis appears to be very poor also has severe hypoalbuminemia with third spacing -Ca 19-9 went to 11k from 1.5K 1 month ago, indicating disease progression  A. fib with RVR - -Add low-dose Toprol with midodrine as blood pressure tolerates  Hyponatremia -  -Improving with hydration, has a component of chronic hyponatremia to  Hypomagnesemia - replaced   Anemia / Thrombocytopenia - likely due to chemotherapy.  -Oncology following  Anasarca / Hypoalbuminemia - Albumin 1.8.  Given IV albumin on admission.   -Discontinue IV fluids  Oral ulcers - due to chemo.  On magic mouthwash and topical vaseline  Insulin-dependent DM - insulin coverage and CBG's. Adjust insulin for inpatient goal 140-180  Gastritis - seen on EGD 12/04/2020.   --Continue PPI and Carafate   DVT prophylaxis: Place and maintain sequential compression device Start: 12/15/20 2000SCD's due to thrombocytopenia    Code Status: DNR  Family communication: No family at bedside, called and updated daughter Truman Hayward Disposition: Will ask PT OT to evaluate, may need rehab  Subjective 12/16/20    -Feels tired, reports feeling a little better compared to yesterday, has chronic abdominal pain, denies any nausea or vomiting, denies dyspnea, has increased swelling in her arms and legs   Disposition Plan & Communication   Status is: Inpatient  Remains inpatient appropriate because: severity of illness as outlined above.   Consults, Procedures, Significant Events   Consultants:  Oncology  Procedures:  none  Antimicrobials:  Anti-infectives (From admission, onward)    Start     Dose/Rate Route Frequency Ordered  Stop   12/16/20 2000  vancomycin (VANCOREADY) IVPB 1500 mg/300 mL        1,500 mg 150 mL/hr over 120 Minutes Intravenous Every 24 hours 12/16/20 1040     12/15/20 1700  vancomycin (VANCOREADY) IVPB 1000 mg/200 mL  Status:  Discontinued        1,000 mg 200 mL/hr over 60 Minutes Intravenous Every 24 hours 12/14/20 1616 12/16/20 1040   12/15/20 0800  metroNIDAZOLE (FLAGYL) IVPB 500 mg        500 mg 100 mL/hr over 60 Minutes Intravenous Every 12 hours 12/14/20 2225 12/22/20 0759   12/14/20 2200  aztreonam (AZACTAM) 2 g in sodium chloride 0.9 % 100 mL IVPB        2 g 200 mL/hr over 30 Minutes Intravenous Every 8 hours 12/14/20 1616     12/14/20 1630  vancomycin (VANCOREADY) IVPB 1000 mg/200 mL        1,000 mg 200 mL/hr over 60 Minutes Intravenous  Once 12/14/20 1548 12/14/20 1814   12/14/20 1600  aztreonam (AZACTAM) 2 g in sodium chloride 0.9 % 100 mL IVPB        2 g 200 mL/hr over 30 Minutes Intravenous  Once 12/14/20 1548 12/14/20 2034   12/14/20 1600  metroNIDAZOLE (FLAGYL) IVPB 500 mg        500 mg 100 mL/hr over 60 Minutes Intravenous  Once 12/14/20 1548 12/14/20 2200         Micro    Objective   Vitals:   12/16/20 1121 12/16/20 1152 12/16/20 1200 12/16/20 1331  BP: (!) 90/54 (!) 74/47 (!) 86/58 (!) 83/63  Pulse:    (!) 110  Resp: 10 10 12 12   Temp: (!) 97.5 F (36.4 C)     TempSrc: Oral     SpO2: 99% 99%  98%  Weight:      Height:        Intake/Output Summary (Last 24 hours) at 12/16/2020 1427 Last data filed at 12/16/2020 0958 Gross per 24 hour  Intake 237.92 ml  Output 350 ml  Net -112.08 ml   Filed Weights   12/15/20 0419 12/16/20 0433  Weight: 61.3 kg 71.2 kg   Gen: Chronically ill ill elderly female laying in bed, awake alert oriented to self and place HEENT: no JVD Lungs: Decreased breath sounds bases CVS: S1S2/RRR Abd: soft, Non tender, non distended, BS present Extremities: 2 plus edema, third spacing in upper and lower extremities Skin: no new  rashes on exposed skin   Labs   Data Reviewed: I have personally reviewed following labs and imaging studies  CBC: Recent Labs  Lab 12/14/20 1340 12/14/20 1635 12/14/20 1810 12/15/20 0235 12/15/20 0945 12/16/20 0256  WBC 9.4 6.6  --  3.5* 3.0* 1.8*  NEUTROABS 9.0*  --   --   --   --  1.3*  HGB 7.9* 8.5* 9.2* 6.2* 7.6* 7.8*  HCT 23.7* 25.7* 27.0* 18.0* 22.4* 22.5*  MCV 81.7 85.1  --  82.9 82.7 82.7  PLT 79* 63*  --  41* 37* 26*   Basic Metabolic Panel: Recent Labs  Lab 12/14/20 1340 12/14/20 1635 12/14/20 1810 12/15/20 0235 12/16/20 0256  NA 127* 126* 126* 128* 129*  K 3.8 4.0 4.1 3.7 3.2*  CL 97* 98  --  101 102  CO2 19* 18*  --  19* 20*  GLUCOSE  186* 193*  --  168* 123*  BUN 30* 28*  --  26* 21  CREATININE 0.53 0.66  --  0.53 0.49  CALCIUM 7.3* 7.5*  --  7.3* 7.0*  MG  --   --   --  1.6* 1.8   GFR: Estimated Creatinine Clearance: 53 mL/min (by C-G formula based on SCr of 0.49 mg/dL). Liver Function Tests: Recent Labs  Lab 12/14/20 1340 12/15/20 0235 12/16/20 0256  AST 32 42* 55*  ALT 26 26 31   ALKPHOS 213* 169* 186*  BILITOT 2.8* 2.8* 2.9*  PROT 4.9* 4.1* 3.9*  ALBUMIN 1.9* 1.8* 1.6*   No results for input(s): LIPASE, AMYLASE in the last 168 hours. Recent Labs  Lab 12/14/20 1340 12/14/20 1635  AMMONIA 84* 32   Coagulation Profile: Recent Labs  Lab 12/14/20 1635  INR 1.5*   Cardiac Enzymes: No results for input(s): CKTOTAL, CKMB, CKMBINDEX, TROPONINI in the last 168 hours. BNP (last 3 results) No results for input(s): PROBNP in the last 8760 hours. HbA1C: No results for input(s): HGBA1C in the last 72 hours. CBG: Recent Labs  Lab 12/15/20 2034 12/15/20 2356 12/16/20 0428 12/16/20 0751 12/16/20 1130  GLUCAP 150* 138* 94 95 124*   Lipid Profile: No results for input(s): CHOL, HDL, LDLCALC, TRIG, CHOLHDL, LDLDIRECT in the last 72 hours. Thyroid Function Tests: Recent Labs    12/15/20 0235  TSH 0.924   Anemia Panel: No results  for input(s): VITAMINB12, FOLATE, FERRITIN, TIBC, IRON, RETICCTPCT in the last 72 hours. Sepsis Labs: Recent Labs  Lab 12/14/20 1635 12/14/20 2000 12/15/20 0235 12/16/20 0256  LATICACIDVEN 2.6* 2.7* 2.1* 2.0*    Recent Results (from the past 240 hour(s))  Resp Panel by RT-PCR (Flu A&B, Covid) Nasopharyngeal Swab     Status: None   Collection Time: 12/14/20  3:44 PM   Specimen: Nasopharyngeal Swab; Nasopharyngeal(NP) swabs in vial transport medium  Result Value Ref Range Status   SARS Coronavirus 2 by RT PCR NEGATIVE NEGATIVE Final    Comment: (NOTE) SARS-CoV-2 target nucleic acids are NOT DETECTED.  The SARS-CoV-2 RNA is generally detectable in upper respiratory specimens during the acute phase of infection. The lowest concentration of SARS-CoV-2 viral copies this assay can detect is 138 copies/mL. A negative result does not preclude SARS-Cov-2 infection and should not be used as the sole basis for treatment or other patient management decisions. A negative result may occur with  improper specimen collection/handling, submission of specimen other than nasopharyngeal swab, presence of viral mutation(s) within the areas targeted by this assay, and inadequate number of viral copies(<138 copies/mL). A negative result must be combined with clinical observations, patient history, and epidemiological information. The expected result is Negative.  Fact Sheet for Patients:  EntrepreneurPulse.com.au  Fact Sheet for Healthcare Providers:  IncredibleEmployment.be  This test is no t yet approved or cleared by the [place] FDA and  has been authorized for detection and/or diagnosis of SARS-CoV-2 by FDA under an Emergency Use Authorization (EUA). This EUA will remain  in effect (meaning this test can be used) for the duration of the COVID-19 declaration under Section 564(b)(1) of the Act, 21 U.S.C.section 360bbb-3(b)(1), unless the authorization is  terminated  or revoked sooner.       Influenza A by PCR NEGATIVE NEGATIVE Final   Influenza B by PCR NEGATIVE NEGATIVE Final    Comment: (NOTE) The Xpert Xpress SARS-CoV-2/FLU/RSV plus assay is intended as an aid in the diagnosis of influenza from Nasopharyngeal swab specimens and  should not be used as a sole basis for treatment. Nasal washings and aspirates are unacceptable for Xpert Xpress SARS-CoV-2/FLU/RSV testing.  Fact Sheet for Patients: EntrepreneurPulse.com.au  Fact Sheet for Healthcare Providers: IncredibleEmployment.be  This test is not yet approved or cleared by the [place] FDA and has been authorized for detection and/or diagnosis of SARS-CoV-2 by FDA under an Emergency Use Authorization (EUA). This EUA will remain in effect (meaning this test can be used) for the duration of the COVID-19 declaration under Section 564(b)(1) of the Act, 21 U.S.C. section 360bbb-3(b)(1), unless the authorization is terminated or revoked.  Performed at [facility], [place] [address], [place], [place] [address]   Blood culture (routine x 2)     Status: None (Preliminary result)   Collection Time: 12/14/20  4:30 PM   Specimen: BLOOD  Result Value Ref Range Status   Specimen Description BLOOD RIGHT ANTECUBITAL  Final   Special Requests   Final    BOTTLES DRAWN AEROBIC AND ANAEROBIC Blood Culture adequate volume   Culture   Final    NO GROWTH 2 DAYS Performed at [facility], [place] [address], [place], [place] [address]    Report Status PENDING  Incomplete  Blood culture (routine x 2)     Status: None (Preliminary result)   Collection Time: 12/14/20  4:35 PM   Specimen: BLOOD  Result Value Ref Range Status   Specimen Description BLOOD RIGHT ANTECUBITAL  Final   Special Requests   Final    BOTTLES DRAWN AEROBIC AND ANAEROBIC Blood Culture adequate volume   Culture   Final    NO GROWTH 2 DAYS Performed at [facility],  [place] [address], [place],  [address]    Report Status PENDING  Incomplete      Imaging Studies   DG Chest 2 View  Result Date: 12/14/2020 CLINICAL DATA:  Weakness. Slurred speech. Lethargy. Currently undergoing chemotherapy for pancreatic cancer. EXAM: CHEST - 2 VIEW COMPARISON:  03/11/2020 FINDINGS: Normal sized heart. Mildly progressive elevation of the right hemidiaphragm with mild right basilar atelectasis and possible small right pleural effusion. Stable right subclavian porta catheter with its tip in the proximal superior vena cava. Diffuse osteopenia. Upper abdominal surgical clips and biliary stent. IMPRESSION: Mildly progressive elevation of the right hemidiaphragm with mild right basilar atelectasis and possible small right pleural effusion. Electronically Signed   By: Claudie Revering M.D.   On: 12/14/2020 15:59   CT Head Wo Contrast  Result Date: 12/14/2020 CLINICAL DATA:  Neuro deficit, acute, stroke suspected EXAM: CT HEAD WITHOUT CONTRAST TECHNIQUE: Contiguous axial images were obtained from the base of the skull through the vertex without intravenous contrast. COMPARISON:  CT 03/05/2020. FINDINGS: Brain: No evidence of acute intracranial hemorrhage or extra-axial collection.No evidence of mass lesion/concern mass effect.The ventricles are normal in size.Scattered subcortical and periventricular white matter hypodensities, nonspecific but likely sequela of chronic small vessel ischemic disease. Vascular: No hyperdense vessel or unexpected calcification. Skull: Normal. Negative for fracture or focal lesion. Sinuses/Orbits: No acute finding. Other: None. IMPRESSION: No acute intracranial abnormality. Mild chronic small vessel ischemic changes. Electronically Signed   By: Maurine Simmering M.D.   On: 12/14/2020 16:11   MR BRAIN WO CONTRAST  Result Date: 12/15/2020 CLINICAL DATA:  Stroke suspected EXAM: MRI HEAD WITHOUT CONTRAST TECHNIQUE: Multiplanar, multiecho pulse sequences of the brain and  surrounding structures were obtained without intravenous contrast. COMPARISON:  CT head 12/14/2020, no prior MRI FINDINGS: Brain: No restricted  diffusion to suggest acute infarction. No acute hemorrhage, mass, mass effect, or midline shift. No hydrocephalus or extra-axial collection. Ventricles and sulci are within normal limits for age. Vascular: Normal flow voids. Skull and upper cervical spine: Normal marrow signal. Sinuses/Orbits: Postoperative changes in the sinuses, with mucosal thickening in the ethmoid air cells. Status post bilateral lens replacements. Other: Trace fluid in bilateral mastoid air cells. IMPRESSION: No acute intracranial process. Electronically Signed   By: Merilyn Baba M.D.   On: 12/15/2020 19:59     Medications   Scheduled Meds:  acidophilus  1 capsule Oral Daily   Chlorhexidine Gluconate Cloth  6 each Topical Daily   feeding supplement  237 mL Oral BID BM   insulin aspart  0-9 Units Subcutaneous Q4H   lipase/protease/amylase  36,000 Units Oral TID AC   loratadine  10 mg Oral Daily   metoprolol succinate  12.5 mg Oral Daily   midodrine  5 mg Oral BID WC   pantoprazole  40 mg Oral Daily   sodium chloride flush  3 mL Intravenous Q12H   sucralfate  1 g Oral BID WC   Continuous Infusions:  aztreonam 2 g (12/16/20 0913)   metronidazole 500 mg (12/16/20 1336)   vancomycin         LOS: 2 days    Time spent: 35 minutes    Domenic Polite, MD [facility]  12/16/2020, 2:27 PM

## 2020-12-16 NOTE — Progress Notes (Signed)
Pt's HR has improved, low 100s, however BP remains low, last BP 87/49. Pt is now yellow MEWS. James Daniels-NP notified, received new order for additional IV bolus with continuous LR. Pt alert and verbally responsive when approached by staff.

## 2020-12-17 ENCOUNTER — Telehealth: Payer: Self-pay

## 2020-12-17 DIAGNOSIS — E43 Unspecified severe protein-calorie malnutrition: Secondary | ICD-10-CM | POA: Insufficient documentation

## 2020-12-17 DIAGNOSIS — G9340 Encephalopathy, unspecified: Secondary | ICD-10-CM | POA: Diagnosis not present

## 2020-12-17 LAB — CBC WITH DIFFERENTIAL/PLATELET
Abs Immature Granulocytes: 0.09 10*3/uL — ABNORMAL HIGH (ref 0.00–0.07)
Abs Immature Granulocytes: 0.1 10*3/uL — ABNORMAL HIGH (ref 0.00–0.07)
Basophils Absolute: 0 10*3/uL (ref 0.0–0.1)
Basophils Absolute: 0 10*3/uL (ref 0.0–0.1)
Basophils Relative: 1 %
Basophils Relative: 1 %
Eosinophils Absolute: 0 10*3/uL (ref 0.0–0.5)
Eosinophils Absolute: 0.1 10*3/uL (ref 0.0–0.5)
Eosinophils Relative: 1 %
Eosinophils Relative: 5 %
HCT: 22.5 % — ABNORMAL LOW (ref 36.0–46.0)
HCT: 24.4 % — ABNORMAL LOW (ref 36.0–46.0)
Hemoglobin: 7.8 g/dL — ABNORMAL LOW (ref 12.0–15.0)
Hemoglobin: 8.4 g/dL — ABNORMAL LOW (ref 12.0–15.0)
Immature Granulocytes: 5 %
Immature Granulocytes: 7 %
Lymphocytes Relative: 13 %
Lymphocytes Relative: 23 %
Lymphs Abs: 0.2 10*3/uL — ABNORMAL LOW (ref 0.7–4.0)
Lymphs Abs: 0.3 10*3/uL — ABNORMAL LOW (ref 0.7–4.0)
MCH: 28.7 pg (ref 26.0–34.0)
MCH: 28.4 pg (ref 26.0–34.0)
MCHC: 34.7 g/dL (ref 30.0–36.0)
MCHC: 34.4 g/dL (ref 30.0–36.0)
MCV: 82.7 fL (ref 80.0–100.0)
MCV: 82.4 fL (ref 80.0–100.0)
Monocytes Absolute: 0.2 10*3/uL (ref 0.1–1.0)
Monocytes Absolute: 0.3 10*3/uL (ref 0.1–1.0)
Monocytes Relative: 8 %
Monocytes Relative: 23 %
Neutro Abs: 1.3 10*3/uL — ABNORMAL LOW (ref 1.7–7.7)
Neutro Abs: 0.6 10*3/uL — ABNORMAL LOW (ref 1.7–7.7)
Neutrophils Relative %: 72 %
Neutrophils Relative %: 41 %
Platelets: 26 10*3/uL — CL (ref 150–400)
Platelets: 20 10*3/uL — CL (ref 150–400)
RBC: 2.72 MIL/uL — ABNORMAL LOW (ref 3.87–5.11)
RBC: 2.96 MIL/uL — ABNORMAL LOW (ref 3.87–5.11)
RDW: 15.7 % — ABNORMAL HIGH (ref 11.5–15.5)
RDW: 15.9 % — ABNORMAL HIGH (ref 11.5–15.5)
WBC: 1.8 10*3/uL — ABNORMAL LOW (ref 4.0–10.5)
WBC: 1.4 10*3/uL — CL (ref 4.0–10.5)
nRBC: 0 % (ref 0.0–0.2)
nRBC: 0 % (ref 0.0–0.2)

## 2020-12-17 LAB — GLUCOSE, CAPILLARY
Glucose-Capillary: 116 mg/dL — ABNORMAL HIGH (ref 70–99)
Glucose-Capillary: 139 mg/dL — ABNORMAL HIGH (ref 70–99)
Glucose-Capillary: 146 mg/dL — ABNORMAL HIGH (ref 70–99)
Glucose-Capillary: 172 mg/dL — ABNORMAL HIGH (ref 70–99)
Glucose-Capillary: 94 mg/dL (ref 70–99)

## 2020-12-17 LAB — COMPREHENSIVE METABOLIC PANEL
ALT: 38 U/L (ref 0–44)
AST: 55 U/L — ABNORMAL HIGH (ref 15–41)
Albumin: 1.7 g/dL — ABNORMAL LOW (ref 3.5–5.0)
Alkaline Phosphatase: 245 U/L — ABNORMAL HIGH (ref 38–126)
Anion gap: 5 (ref 5–15)
BUN: 20 mg/dL (ref 8–23)
CO2: 20 mmol/L — ABNORMAL LOW (ref 22–32)
Calcium: 7.1 mg/dL — ABNORMAL LOW (ref 8.9–10.3)
Chloride: 106 mmol/L (ref 98–111)
Creatinine, Ser: 0.44 mg/dL (ref 0.44–1.00)
GFR, Estimated: >60 mL/min (ref >60)
Glucose, Bld: 131 mg/dL — ABNORMAL HIGH (ref 70–99)
Potassium: 3.5 mmol/L (ref 3.5–5.1)
Sodium: 131 mmol/L — ABNORMAL LOW (ref 135–145)
Total Bilirubin: 2 mg/dL — ABNORMAL HIGH (ref 0.3–1.2)
Total Protein: 4 g/dL — ABNORMAL LOW (ref 6.5–8.1)

## 2020-12-17 LAB — CBC
HCT: 25.3 % — ABNORMAL LOW (ref 36.0–46.0)
Hemoglobin: 8.5 g/dL — ABNORMAL LOW (ref 12.0–15.0)
MCH: 28.3 pg (ref 26.0–34.0)
MCHC: 33.6 g/dL (ref 30.0–36.0)
MCV: 84.3 fL (ref 80.0–100.0)
Platelets: 22 10*3/uL — CL (ref 150–400)
RBC: 3 MIL/uL — ABNORMAL LOW (ref 3.87–5.11)
RDW: 16.2 % — ABNORMAL HIGH (ref 11.5–15.5)
WBC: 1.6 10*3/uL — ABNORMAL LOW (ref 4.0–10.5)
nRBC: 0 % (ref 0.0–0.2)

## 2020-12-17 LAB — TYPE AND SCREEN
ABO/RH(D): A POS
Antibody Screen: NEGATIVE

## 2020-12-17 MED ORDER — ALBUMIN HUMAN 25 % IV SOLN
12.5000 g | Freq: Once | INTRAVENOUS | Status: AC
  Administered 2020-12-17: 12.5 g via INTRAVENOUS
  Filled 2020-12-17: qty 50

## 2020-12-17 MED ORDER — ADULT MULTIVITAMIN W/MINERALS CH
1.0000 | ORAL_TABLET | Freq: Every day | ORAL | Status: DC
  Administered 2020-12-17 – 2020-12-22 (×6): 1 via ORAL
  Filled 2020-12-17 (×6): qty 1

## 2020-12-17 NOTE — Evaluation (Signed)
Occupational Therapy Evaluation Patient Details Name: Nancy Hawkins MRN: [id number] DOB: [birth year] Today's Date: 12/17/2020   History of Present Illness 80 yo female presents to ED with weakness, slurred speech, AMS. Pt with pancreatic cancer, undergoing chemo. CT head negative for acute intracranial findings. Chest x-ray with mild progression in right hemidiaphragm elevation with mild right basilar atelectasis and question of small right pleural effusion. Workup for AME. Other PMH includes PMH significant for type II DM, atrial fibrillation on anticoagulation, bilat LE DVTs, HLD, HTN.   Clinical Impression   Pt admitted with the above diagnoses and presents with below problem list. Pt will benefit from continued acute OT to address the below listed deficits and maximize independence with basic ADLs prior to d/c to venue below. At baseline (>2 weeks ago), pt was independent with basic ADLs, walked household distances. Pt limited today by fatigue. Presents with generalized weakness, decreased activity, tolerance, impaired balance and increased edema (weeping edema in BUE). Spouse present throughout session, son arrived towards end of session. Pt currently needs up to max A +2 for LB ADLs, mod A +2 for bed mobility, max A for UB ADLs. Pt lived with a few family members and the plan is to return home at d/c. Will follow acutely and update equipment and d/c recommendations as needed.        Recommendations for follow up therapy are one component of a multi-disciplinary discharge planning process, led by the attending physician.  Recommendations may be updated based on patient status, additional functional criteria and insurance authorization.   Follow Up Recommendations  Home health OT    Assistance Recommended at Discharge Frequent or constant Supervision/Assistance  Functional Status Assessment  Patient has had a recent decline in their functional status and demonstrates the ability to make  significant improvements in function in a reasonable and predictable amount of time.  Equipment Recommendations  BSC/3in1;Other (comment) (to be further assessed next session)    Recommendations for Other Services       Precautions / Restrictions Precautions Precautions: Fall Restrictions Weight Bearing Restrictions: No      Mobility Bed Mobility Overal bed mobility: Needs Assistance Bed Mobility: Supine to Sit;Sit to Supine     Supine to sit: Mod assist;+2 for physical assistance Sit to supine: Mod assist;+2 for physical assistance   General bed mobility comments: HOB elevated. Assist largely for trunk elevation and to pivot hips to full EOB position.    Transfers                   General transfer comment: unable to assess d/t fatigue      Balance Overall balance assessment: Needs assistance Sitting-balance support: Bilateral upper extremity supported;Feet supported Sitting balance-Leahy Scale: Poor Sitting balance - Comments: initially BUE and min steadying support. improved to single UE support and min guard.                                   ADL either performed or assessed with clinical judgement   ADL Overall ADL's : Needs assistance/impaired Eating/Feeding: Set up;Bed level Eating/Feeding Details (indicate cue type and reason): able to drink Ensure with setup at bed level Grooming: Moderate assistance;Bed level   Upper Body Bathing: Maximal assistance;Sitting   Lower Body Bathing: Maximal assistance;+2 for safety/equipment;Sitting/lateral leans   Upper Body Dressing : Maximal assistance;Sitting   Lower Body Dressing: Maximal assistance;+2 for safety/equipment;Sitting/lateral leans  General ADL Comments: Pt completed bed mobility, sat EOB several minutes then back to supine position. BUE and BLE elevated on pillows.     Vision         Perception     Praxis      Pertinent Vitals/Pain Pain Assessment:  No/denies pain     Hand Dominance Right   Extremity/Trunk Assessment Upper Extremity Assessment Upper Extremity Assessment: RUE deficits/detail;LUE deficits/detail;Generalized weakness RUE Deficits / Details: weeping edema RUE Coordination: decreased fine motor;decreased gross motor LUE Deficits / Details: weeping edema LUE Coordination: decreased fine motor;decreased gross motor   Lower Extremity Assessment Lower Extremity Assessment: Defer to PT evaluation       Communication Communication Communication: No difficulties   Cognition Arousal/Alertness: Awake/alert Behavior During Therapy: WFL for tasks assessed/performed Overall Cognitive Status: Within Functional Limits for tasks assessed                                 General Comments: slow processing     General Comments  spouse present and involved throughout session. Son present at end of session.    Exercises     Shoulder Instructions      Home Living Family/patient expects to be discharged to:: Private residence Living Arrangements: Spouse/significant other;Children Available Help at Discharge: Family;Available 24 hours/day (daughter works from home, husband is retired, son works) Type of Home: [employer] Access: Stairs to enter Technical brewer of Steps: 2 Entrance Stairs-Rails: Right;Left (depending on entrance) Home Layout: Two level;Able to live on main level with bedroom/bathroom     Bathroom Shower/Tub: Occupational psychologist: Standard     Home Equipment: Shower seat - built in;Grab bars - tub/shower;Toilet riser          Prior Functioning/Environment Prior Level of Function : Needs assist             Mobility Comments: > 2 weeks ago was walking household distances ADLs Comments: > 2 weeks ago independent with basic ADLs        OT Problem List: Decreased strength;Decreased activity tolerance;Impaired balance (sitting and/or standing);Decreased knowledge  of use of DME or AE;Decreased knowledge of precautions;Cardiopulmonary status limiting activity;Pain;Increased edema;Impaired UE functional use      OT Treatment/Interventions: Self-care/ADL training;Therapeutic exercise;Energy conservation;DME and/or AE instruction;Therapeutic activities;Patient/family education;Balance training    OT Goals(Current goals can be found in the care plan section) Acute Rehab OT Goals Patient Stated Goal: home OT Goal Formulation: With patient/family Time For Goal Achievement: 12/23/2020 Potential to Achieve Goals: Fair  OT Frequency: Min 2X/week   Barriers to D/C:            Co-evaluation PT/OT/SLP Co-Evaluation/Treatment: Yes Reason for Co-Treatment: For patient/therapist safety;To address functional/ADL transfers   OT goals addressed during session: ADL's and self-care;Strengthening/ROM      AM-PAC OT "6 Clicks" Daily Activity     Outcome Measure Help from another person eating meals?: A Little Help from another person taking care of personal grooming?: A Lot Help from another person toileting, which includes using toliet, bedpan, or urinal?: Total Help from another person bathing (including washing, rinsing, drying)?: A Lot Help from another person to put on and taking off regular upper body clothing?: A Lot Help from another person to put on and taking off regular lower body clothing?: Total 6 Click Score: 11   End of Session Nurse Communication: Other (comment) (weeping edema. spouse reports pt has not received a  meal tray)  Activity Tolerance: Patient limited by fatigue Patient left: in bed;with call bell/phone within reach;with bed alarm set;with family/visitor present;with SCD's reapplied  OT Visit Diagnosis: Unsteadiness on feet (R26.81);Other abnormalities of gait and mobility (R26.89);Muscle weakness (generalized) (M62.81);Pain                Time: 1211-1242 OT Time Calculation (min): 31 min Charges:  OT General Charges $OT Visit: 1  Visit OT Evaluation $OT Eval Moderate Complexity: [place], OT Acute Rehabilitation Services Pager: [phone] Office: [phone]   Hortencia Pilar 12/17/2020, 1:06 PM

## 2020-12-17 NOTE — Progress Notes (Signed)
Initial Nutrition Assessment  DOCUMENTATION CODES:  Severe malnutrition in context of chronic illness  INTERVENTION:  -Ensure Enlive po BID, each supplement provides 350 kcal and 20 grams of protein -MVI with minerals daily  NUTRITION DIAGNOSIS:  Severe Malnutrition related to chronic illness (cancer) as evidenced by severe fat depletion, severe muscle depletion  GOAL:  Patient will meet greater than or equal to 90% of their needs  MONITOR:  PO intake, Supplement acceptance, Labs, Weight trends, I & O's  REASON FOR ASSESSMENT:  Malnutrition Screening Tool    ASSESSMENT:  Pt with PMH significant for 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  Per MD, encephalopathy likely multifactorial, possibly dehydration, ? UTI. MD notes pt's prognosis is overall very poor. Third spacing noted. MD recommending PMT consultation.   Pt providing limited responses to RD questions. Pt c/o progressive generalized weakness and fatigue since getting chemotherapy on 12/10/2020 in addition to very poor appetite. Denies N/V. Pt being followed by cancer center. Pt does have oral ulcers, using magic mouthwash and topical vaseline. Pt agreeable to use of oral nutrition supplements.   Weight history reviewed. No weight loss observed at this time, however, pt with deep pitting edema/third spacing which is likely masking both weight loss/changes and/or potential muscle/fat depletions. Given severe edema, will utilize 52.5 kg to estimated nutritional needs at this time as pt's weight history has been consistent between 52-53 kg up until 11/03.  PO Intake: 75% x 1 recorded meal   No UOP documented x24 hours I/O: +2067ml since admit  Deep pitting edema to BUE and BLE per RN assessment  Medications: Scheduled Meds:  acidophilus  1 capsule Oral Daily   Chlorhexidine Gluconate Cloth  6 each Topical Daily    feeding supplement  237 mL Oral BID BM   insulin aspart  0-9 Units Subcutaneous Q4H   lipase/protease/amylase  36,000 Units Oral TID AC   loratadine  10 mg Oral Daily   metoprolol succinate  12.5 mg Oral Daily   midodrine  5 mg Oral BID WC   multivitamin with minerals  1 tablet Oral Daily   pantoprazole  40 mg Oral Daily   sodium chloride flush  3 mL Intravenous Q12H   sucralfate  1 g Oral BID WC  Continuous Infusions:  aztreonam 2 g (12/17/20 1014)   metronidazole 500 mg (12/17/20 0819)   Labs: Recent Labs  Lab 12/15/20 0235 12/16/20 0256 12/17/20 0333  NA 128* 129* 131*  K 3.7 3.2* 3.5  CL 101 102 106  CO2 19* 20* 20*  BUN 26* 21 20  CREATININE 0.53 0.49 0.44  CALCIUM 7.3* 7.0* 7.1*  MG 1.6* 1.8  --   GLUCOSE 168* 123* 131*  CBGs: 94-174 x24 hours    NUTRITION - FOCUSED PHYSICAL EXAM:  Flowsheet Row Most Recent Value  Orbital Region Severe depletion  Upper Arm Region No depletion  Thoracic and Lumbar Region Severe depletion  Buccal Region Severe depletion  Temple Region Severe depletion  Clavicle Bone Region Severe depletion  Clavicle and Acromion Bone Region Severe depletion  Scapular Bone Region Severe depletion  Dorsal Hand No depletion  Patellar Region Mild depletion  Anterior Thigh Region Mild depletion  Posterior Calf Region Mild depletion  Edema (RD Assessment) Severe  [deep pitting edema to BUE and BLE]  Hair Reviewed  Eyes Reviewed  Mouth Reviewed  Skin Reviewed  Nails Reviewed      Diet Order:  Diet Order             Diet Carb Modified Fluid consistency: Thin; Room service appropriate? Yes  Diet effective now                  EDUCATION NEEDS:  No education needs have been identified at this time  Skin:  Skin Assessment: Reviewed RN Assessment  Last BM:  11/8  Height:  Ht Readings from Last 1 Encounters:  12/15/20 5\' 3"  (1.6 m)   Weight:  Wt Readings from Last 10 Encounters:  12/17/20 72 kg  12/10/20 61.3 kg  12/02/20 53.3  kg  12/01/20 52.4 kg  10/20/20 52.7 kg  09/14/20 52.1 kg  08/17/20 52.8 kg  07/27/20 52.7 kg  07/23/20 53.2 kg  07/07/20 52.6 kg   BMI:  Body mass index is 28.12 kg/m.  Estimated Nutritional Needs:  Kcal:  1400-1600 Protein:  70-80 grams Fluid:  >1.4L/d     Theone Stanley., MS, RD, LDN (she/her/hers) RD pager number and weekend/on-call pager number located in [place].

## 2020-12-17 NOTE — Care Management Important Message (Signed)
Important Message  Patient Details  Name: Nancy Hawkins MRN: [id number] Date of Birth: 1940/04/10   Medicare Important Message Given:  Yes     Jillene Wehrenberg [address] 12/17/2020, 2:53 PM

## 2020-12-17 NOTE — Progress Notes (Signed)
IP PROGRESS NOTE  Subjective:   [name] had hypotension again during the night.  No pain.  No new complaint.  Objective: Vital signs in last 24 hours: Blood pressure (!) 90/53, pulse (!) 125, temperature 97.9 F (36.6 C), temperature source Oral, resp. rate 12, height 5\' 3"  (1.6 m), weight 158 lb 11.7 oz (72 kg), SpO2 96 %.  Intake/Output from previous day: 11/09 0701 - 11/10 0700 In: 1107.8 [P.O.:240; I.V.:3; IV Piggyback:864.8] Out: -   Physical Exam:  HEENT: Bleeding at the lips, no thrush Lungs: Clear anteriorly Cardiac: Irregular, tachycardia Abdomen: Distended with ascites, nontender Extremities: Pitting edema in the arms and legs Neurologic: Alert and oriented, follows commands  Portacath/PICC-without erythema  Lab Results: Recent Labs    12/16/20 0256 12/17/20 0333  WBC 1.8* 1.6*  HGB 7.8* 8.5*  HCT 22.5* 25.3*  PLT 26* 22*    BMET Recent Labs    12/16/20 0256 12/17/20 0333  NA 129* 131*  K 3.2* 3.5  CL 102 106  CO2 20* 20*  GLUCOSE 123* 131*  BUN 21 20  CREATININE 0.49 0.44  CALCIUM 7.0* 7.1*    Lab Results  Component Value Date   CAN199 11,154 (H) 12/01/2020    Studies/Results: MR BRAIN WO CONTRAST  Result Date: 12/15/2020 CLINICAL DATA:  Stroke suspected EXAM: MRI HEAD WITHOUT CONTRAST TECHNIQUE: Multiplanar, multiecho pulse sequences of the brain and surrounding structures were obtained without intravenous contrast. COMPARISON:  CT head 12/14/2020, no prior MRI FINDINGS: Brain: No restricted diffusion to suggest acute infarction. No acute hemorrhage, mass, mass effect, or midline shift. No hydrocephalus or extra-axial collection. Ventricles and sulci are within normal limits for age. Vascular: Normal flow voids. Skull and upper cervical spine: Normal marrow signal. Sinuses/Orbits: Postoperative changes in the sinuses, with mucosal thickening in the ethmoid air cells. Status post bilateral lens replacements. Other: Trace fluid in bilateral  mastoid air cells. IMPRESSION: No acute intracranial process. Electronically Signed   By: Nancy Hawkins M.D.   On: 12/15/2020 19:59    Medications: I have reviewed the patient's current medications.  Assessment/Plan:  Pancreas cancer, borderline resectable CT abdomen/pelvis-inflammation involving the mid body of the pancreas with peripancreatic fat stranding; possible 2.5 x 2.2 cm low-attenuation mass within the mid body of the pancreas, appears to abut the celiac axis as well as the splenic vein.   MRI 11/18/2019-extensive inflammatory fat stranding about the pancreas with a thinly septated multicystic fluid signal lesion of the anterior pancreatic neck measuring 3.2 x 1.7 cm most consistent with a pancreatic pseudocyst; numerous additional small fluid signal lesions throughout the pancreatic parenchyma, the remaining lesions subcentimeter; central portion of pancreatic duct dilated measuring up to 8 mm without obvious obstructing lesion identified; trace ascites; trace bilateral pleural effusions and associated atelectasis or consolidation.   Colonoscopy 11/20/2019-5 polyps (resected and retrieved- multiple tubular adenomas, negative for high-grade dysplasia); 2 colonic angioectasias; diverticulosis in the entire examined colon; nonbleeding nonthrombosed external and internal hemorrhoids.   EGD/EUS 11/20/2019-few white nummular lesions in the entire esophagus (esophagus biopsy with no significant pathologic findings, no fungal elements); hematin found in the entire examined stomach; gastritis (stomach biopsy showed gastropathy/gastritis); 14 mm semisessile polyp found in the posterior wall of the gastric antrum (hyperplastic polyp); a lesion was identified in the pancreatic body staged T2 N0 MX.  FNA of the pancreas lesion showed benign reactive/reparative changes, prominent inflammation.   EUS procedure 01/20/2020-masslike region identified in the pancreatic head, stage T2 N0 MX.  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 CTs chest/pelvis 01/29/2020-no evidence of metastatic disease CT abdomen/pelvis 03/05/2020-increase in size of pancreas head/uncinate mass, marked narrowing of the adjacent portal vein, patchy consolidation in the right lower lung, mild intrahepatic and extrahepatic biliary ductal dilatation Cycle 1 FOLFOX 03/23/2020 Cycle 2 FOLFOX 04/06/2020, Emend added for delayed nausea after cycle 1 Cycle 3 FOLFIRINOX 04/20/2020 Cycle 4 FOLFIRINOX 05/04/2020, Udenyca Cycle 5 FOLFIRINOX 05/18/2020, Udenyca Cycle 6 FOLFIRINOX 06/02/2020, Udenyca CTs 06/15/2020- decreased size of pancreatic mass, still with portal vein involvement and extensive upper abdominal collaterals. 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 Cycle 1 gemcitabine/Abraxane 12/10/2020 Pancreatitis October 2021 Diabetes October 2021 Isolated episode of atrial fibrillation approximately 15 years ago, recurrent atrial fibrillation 12/14/2020 Hypertension COVID-19 infection Admission 03/05/2020 with biliary obstruction-ERCP 03/09/2020, biliary obstruction secondary to known pancreatic head mass found in the lower third of the main duct.  The upper third of the main bile duct and left and right hepatic ducts and all intrahepatic branches moderately dilated secondary to the stricture.  Biliary sphincterotomy performed.  Covered metal biliary stent placed into the common bile duct. Port-A-Cath placement 03/11/2020, Dr. Zenia Hawkins Oxaliplatin neuropathy Hospital admission 12/02/2020-GI bleed Bilateral lower extremity DVTs 12/01/2020-Lovenox Lovenox held on hospital admission 12/02/2020 Heparin 12/07/2020 Lovenox resumed Lovenox held 12/15/2020 secondary to  thrombocytopenia Esophageal varices Admission 12/14/2020 with altered mental status and dehydration Mouth/lip ulcers-secondary to dehydration versus mucositis from chemotherapy   [name] appears stable.  She is now at day 9 following cycle 1 gemcitabine/Abraxane.  The white count and platelets are stable today.  She has mild bleeding from the lips, no other bleeding.  Her mental status has returned to baseline.  No source for infection has been identified.  She had significant pyuria on admission.  A urine culture is pending.  The persistent hypotension may be related to atrial fibrillation and severe hypoalbuminemia.  [name] has metastatic pancreas cancer.  She understands no therapy will be curative.  She will not be a candidate for further chemotherapy unless her performance status improves.  We plan to assess her performance status over the next few days and make a decision on continuing treatment versus hospice care.  Recommendations: Continue intravenous antibiotics, follow-up cultures Management of atrial fibrillation and hypotension per the medical service Monitor CBC daily, no indication for transfusion therapy today, apply Vaseline to lips Hold Lovenox due to the thrombocytopenia I will return to see her again later today when her family members are present.      LOS: 3 days   [name], MD   12/17/2020, 1:38 PM

## 2020-12-17 NOTE — Progress Notes (Signed)
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  Daughter reported progressive generalized weakness and fatigue since getting chemotherapy on 12/10/2020.  Too weak to even ambulate.  Pt seen at the cancer center and directed to the ED for further evaluation.    In the ED, patient afebrile, in A-fib RVR with HR as high as 130s, and BP in the 65Y to 65K systolic.   Head CT negative for acute intracranial abnormality.  Chest x-ray with mild progression in right hemidiaphragm elevation with mild right basilar atelectasis .Labs notable for sodium 126, and elevated BUN to creatinine ratio.  UA noted pyuria, blood cultures were collected in the emergency department, given 2.5 L LR, and started on vancomycin, Azactam, and Flagyl.   Admitted to [facility] service.  Assessment & Plan   Acute metabolic encephalopathy - -Likely multifactorial, possibly dehydration,?  UTI -no overt symptoms but pyuria noted on urinalysis  -Head CT negative, ammonia level normal, no focal neurologic deficits. -Treated with empiric antibiotics, blood cultures are negative, influenza and COVID PCR negative -Discontinue IV fluids, discontinue vancomycin -Remains on IV aztreonam, urine cultures are still pending  Hypotension, lactic acidosis - -Also multifactorial, has chronic hypotension, worsened by A. 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  Diagnosed in December 2021.  Status post SBRT and modified FOLFIRINOX. Had cycle 1 of gemcitabine and Abraxane on 12/10/20. --Continue Creon -Oncology following -Overall prognosis appears to be very poor in the setting of her age, frailty, hemodynamic instability, severe hypoalbuminemia with third spacing in the background of progressive pancreatic CA -Ca 19-9 went to 11k from 1.5K 1 month ago, indicating disease progression -Dr.Sherrill to discuss with family regarding next plans..further Rx vs Hospice care  A. fib with RVR - -Added low-dose Toprol with midodrine as blood pressure tolerates  Hyponatremia -  -Improving with hydration, has a component of chronic hyponatremia to  Hypomagnesemia - replaced   Anemia / Thrombocytopenia - likely due to chemotherapy.  -Oncology following  Anasarca / Hypoalbuminemia - Albumin 1.6.  Given IV albumin multiple times this admission including overnight  -Poor prognosis  Oral ulcers - due to chemo.  On magic mouthwash and topical vaseline  Insulin-dependent DM - insulin coverage and CBG's. Adjust insulin for inpatient goal 140-180  Gastritis - seen on EGD 12/04/2020.   --Continue PPI and Carafate   DVT prophylaxis: Place and maintain sequential compression device Start: 12/15/20 2000SCD's due to thrombocytopenia    Code Status: DNR  Family communication: No family at bedside, called and updated daughter Truman Hayward Disposition: Will ask PT OT to evaluate, may need rehab  Subjective 12/17/20    -Feels tired, reports feeling a little better compared to yesterday, has chronic abdominal pain, denies any nausea or vomiting, denies dyspnea, has increased swelling in her arms and legs   Disposition Plan & Communication  Status is: Inpatient  Remains inpatient appropriate because: severity of illness as outlined  above.   Consults, Procedures, Significant Events   Consultants:  Oncology  Procedures:  none  Antimicrobials:  Anti-infectives (From admission, onward)    Start     Dose/Rate Route Frequency Ordered Stop   12/16/20 2000  vancomycin (VANCOREADY) IVPB 1500 mg/300 mL  Status:  Discontinued        1,500 mg 150 mL/hr over 120 Minutes Intravenous Every 24 hours 12/16/20 1040 12/16/20 1432   12/15/20 1700  vancomycin (VANCOREADY) IVPB 1000 mg/200 mL  Status:  Discontinued        1,000 mg 200 mL/hr over 60 Minutes Intravenous Every 24 hours 12/14/20 1616 12/16/20 1040   12/15/20 0800  metroNIDAZOLE (FLAGYL) IVPB 500 mg        500 mg 100 mL/hr over 60 Minutes Intravenous Every 12 hours 12/14/20 2225 12/22/20 0759   12/14/20 2200  aztreonam (AZACTAM) 2 g in sodium chloride 0.9 % 100 mL IVPB        2 g 200 mL/hr over 30 Minutes Intravenous Every 8 hours 12/14/20 1616     12/14/20 1630  vancomycin (VANCOREADY) IVPB 1000 mg/200 mL        1,000 mg 200 mL/hr over 60 Minutes Intravenous  Once 12/14/20 1548 12/14/20 1814   12/14/20 1600  aztreonam (AZACTAM) 2 g in sodium chloride 0.9 % 100 mL IVPB        2 g 200 mL/hr over 30 Minutes Intravenous  Once 12/14/20 1548 12/14/20 2034   12/14/20 1600  metroNIDAZOLE (FLAGYL) IVPB 500 mg        500 mg 100 mL/hr over 60 Minutes Intravenous  Once 12/14/20 1548 12/14/20 2200         Micro    Objective   Vitals:   12/17/20 0459 12/17/20 0633 12/17/20 0758 12/17/20 1012  BP:  (!) 84/58 (!) 88/60 (!) 90/53  Pulse:   (!) 114 (!) 125  Resp:   12   Temp:   97.9 F (36.6 C)   TempSrc:   Oral   SpO2:   96%   Weight: 72 kg     Height:        Intake/Output Summary (Last 24 hours) at 12/17/2020 1421 Last data filed at 12/17/2020 0431 Gross per 24 hour  Intake 1104.84 ml  Output --  Net 1104.84 ml   Filed Weights   12/15/20 0419 12/16/20 0433 12/17/20 0459  Weight: 61.3 kg 71.2 kg 72 kg   Gen: Chronically ill elderly female lying in  bed, awake alert oriented to self and place HEENT: No JVD CVS: S1-S2, irregularly irregular rhythm Lungs: Decreased breath sounds to bases Abdomen: Soft, nontender, nondistended, bowel sounds present Extremities: 2 plus edema, third spacing in upper and lower extremities Skin: no new rashes on exposed skin   Labs   Data Reviewed: I have personally reviewed following labs and imaging studies  CBC: Recent Labs  Lab 12/14/20 1340 12/14/20 1340 12/14/20 1635 12/14/20 1810 12/15/20 0235 12/15/20 0945 12/16/20 0256 12/17/20 0333  WBC 9.4  --  6.6  --  3.5* 3.0* 1.8* 1.6*  NEUTROABS 9.0*  --   --   --   --   --  1.3*  --   HGB 7.9*   < > 8.5* 9.2* 6.2* 7.6* 7.8* 8.5*  HCT 23.7*  --  25.7* 27.0* 18.0* 22.4* 22.5* 25.3*  MCV 81.7  --  85.1  --  82.9 82.7 82.7  84.3  PLT 79*  --  63*  --  41* 37* 26* 22*   < > = values in this interval not displayed.   Basic Metabolic Panel: Recent Labs  Lab 12/14/20 1340 12/14/20 1635 12/14/20 1810 12/15/20 0235 12/16/20 0256 12/17/20 0333  NA 127* 126* 126* 128* 129* 131*  K 3.8 4.0 4.1 3.7 3.2* 3.5  CL 97* 98  --  101 102 106  CO2 19* 18*  --  19* 20* 20*  GLUCOSE 186* 193*  --  168* 123* 131*  BUN 30* 28*  --  26* 21 20  CREATININE 0.53 0.66  --  0.53 0.49 0.44  CALCIUM 7.3* 7.5*  --  7.3* 7.0* 7.1*  MG  --   --   --  1.6* 1.8  --    GFR: Estimated Creatinine Clearance: 53.3 mL/min (by C-G formula based on SCr of 0.44 mg/dL). Liver Function Tests: Recent Labs  Lab 12/14/20 1340 12/15/20 0235 12/16/20 0256 12/17/20 0333  AST 32 42* 55* 55*  ALT 26 26 31  38  ALKPHOS 213* 169* 186* 245*  BILITOT 2.8* 2.8* 2.9* 2.0*  PROT 4.9* 4.1* 3.9* 4.0*  ALBUMIN 1.9* 1.8* 1.6* 1.7*   No results for input(s): LIPASE, AMYLASE in the last 168 hours. Recent Labs  Lab 12/14/20 1340 12/14/20 1635  AMMONIA 84* 32   Coagulation Profile: Recent Labs  Lab 12/14/20 1635  INR 1.5*   Cardiac Enzymes: No results for input(s): CKTOTAL, CKMB,  CKMBINDEX, TROPONINI in the last 168 hours. BNP (last 3 results) No results for input(s): PROBNP in the last 8760 hours. HbA1C: No results for input(s): HGBA1C in the last 72 hours. CBG: Recent Labs  Lab 12/16/20 1618 12/16/20 2022 12/17/20 0015 12/17/20 0410 12/17/20 0757  GLUCAP 166* 174* 146* 116* 94   Lipid Profile: No results for input(s): CHOL, HDL, LDLCALC, TRIG, CHOLHDL, LDLDIRECT in the last 72 hours. Thyroid Function Tests: Recent Labs    12/15/20 0235  TSH 0.924   Anemia Panel: No results for input(s): VITAMINB12, FOLATE, FERRITIN, TIBC, IRON, RETICCTPCT in the last 72 hours. Sepsis Labs: Recent Labs  Lab 12/14/20 1635 12/14/20 2000 12/15/20 0235 12/16/20 0256  LATICACIDVEN 2.6* 2.7* 2.1* 2.0*    Recent Results (from the past 240 hour(s))  Resp Panel by RT-PCR (Flu A&B, Covid) Nasopharyngeal Swab     Status: None   Collection Time: 12/14/20  3:44 PM   Specimen: Nasopharyngeal Swab; Nasopharyngeal(NP) swabs in vial transport medium  Result Value Ref Range Status   SARS Coronavirus 2 by RT PCR NEGATIVE NEGATIVE Final    Comment: (NOTE) SARS-CoV-2 target nucleic acids are NOT DETECTED.  The SARS-CoV-2 RNA is generally detectable in upper respiratory specimens during the acute phase of infection. The lowest concentration of SARS-CoV-2 viral copies this assay can detect is 138 copies/mL. A negative result does not preclude SARS-Cov-2 infection and should not be used as the sole basis for treatment or other patient management decisions. A negative result may occur with  improper specimen collection/handling, submission of specimen other than nasopharyngeal swab, presence of viral mutation(s) within the areas targeted by this assay, and inadequate number of viral copies(<138 copies/mL). A negative result must be combined with clinical observations, patient history, and epidemiological information. The expected result is Negative.  Fact Sheet for Patients:   EntrepreneurPulse.com.au  Fact Sheet for Healthcare Providers:  IncredibleEmployment.be  This test is no t yet approved or cleared by the [place] and  has been authorized  for detection and/or diagnosis of SARS-CoV-2 by FDA under an Emergency Use Authorization (EUA). This EUA will remain  in effect (meaning this test can be used) for the duration of the COVID-19 declaration under Section 564(b)(1) of the Act, 21 U.S.C.section 360bbb-3(b)(1), unless the authorization is terminated  or revoked sooner.       Influenza A by PCR NEGATIVE NEGATIVE Final   Influenza B by PCR NEGATIVE NEGATIVE Final    Comment: (NOTE) The Xpert Xpress SARS-CoV-2/FLU/RSV plus assay is intended as an aid in the diagnosis of influenza from Nasopharyngeal swab specimens and should not be used as a sole basis for treatment. Nasal washings and aspirates are unacceptable for Xpert Xpress SARS-CoV-2/FLU/RSV testing.  Fact Sheet for Patients: EntrepreneurPulse.com.au  Fact Sheet for Healthcare Providers: IncredibleEmployment.be  This test is not yet approved or cleared by the [place] FDA and has been authorized for detection and/or diagnosis of SARS-CoV-2 by FDA under an Emergency Use Authorization (EUA). This EUA will remain in effect (meaning this test can be used) for the duration of the COVID-19 declaration under Section 564(b)(1) of the Act, 21 U.S.C. section 360bbb-3(b)(1), unless the authorization is terminated or revoked.  Performed at [facility], [place] [address] [place], [place] [address]   Blood culture (routine x 2)     Status: None (Preliminary result)   Collection Time: 12/14/20  4:30 PM   Specimen: BLOOD  Result Value Ref Range Status   Specimen Description BLOOD RIGHT ANTECUBITAL  Final   Special Requests   Final    BOTTLES DRAWN AEROBIC AND ANAEROBIC Blood Culture adequate volume   Culture    Final    NO GROWTH 3 DAYS Performed at [facility], [place] [address] [place], [place] [address]    Report Status PENDING  Incomplete  Blood culture (routine x 2)     Status: None (Preliminary result)   Collection Time: 12/14/20  4:35 PM   Specimen: BLOOD  Result Value Ref Range Status   Specimen Description BLOOD RIGHT ANTECUBITAL  Final   Special Requests   Final    BOTTLES DRAWN AEROBIC AND ANAEROBIC Blood Culture adequate volume   Culture   Final    NO GROWTH 3 DAYS Performed at [facility], [place] [address] [place], [place] [address]    Report Status PENDING  Incomplete      Imaging Studies   MR BRAIN WO CONTRAST  Result Date: 12/15/2020 CLINICAL DATA:  Stroke suspected EXAM: MRI HEAD WITHOUT CONTRAST TECHNIQUE: Multiplanar, multiecho pulse sequences of the brain and surrounding structures were obtained without intravenous contrast. COMPARISON:  CT head 12/14/2020, no prior MRI FINDINGS: Brain: No restricted diffusion to suggest acute infarction. No acute hemorrhage, mass, mass effect, or midline shift. No hydrocephalus or extra-axial collection. Ventricles and sulci are within normal limits for age. Vascular: Normal flow voids. Skull and upper cervical spine: Normal marrow signal. Sinuses/Orbits: Postoperative changes in the sinuses, with mucosal thickening in the ethmoid air cells. Status post bilateral lens replacements. Other: Trace fluid in bilateral mastoid air cells. IMPRESSION: No acute intracranial process. Electronically Signed   By: Merilyn Baba M.D.   On: 12/15/2020 19:59     Medications   Scheduled Meds:  acidophilus  1 capsule Oral Daily   Chlorhexidine Gluconate Cloth  6 each Topical Daily   feeding supplement  237 mL Oral BID BM   insulin aspart  0-9 Units Subcutaneous Q4H   lipase/protease/amylase  36,000 Units Oral TID  AC   loratadine  10 mg Oral Daily   metoprolol succinate  12.5 mg Oral Daily   midodrine  5 mg Oral BID WC   multivitamin with  minerals  1 tablet Oral Daily   pantoprazole  40 mg Oral Daily   sodium chloride flush  3 mL Intravenous Q12H   sucralfate  1 g Oral BID WC   Continuous Infusions:  aztreonam 2 g (12/17/20 1014)   metronidazole 500 mg (12/17/20 0819)    LOS: 3 days    Time spent: 35 minutes  Domenic Polite, MD [facility]  12/17/2020, 2:21 PM

## 2020-12-17 NOTE — Evaluation (Signed)
Physical Therapy Evaluation Patient Details Name: Nancy Hawkins MRN: [id number] DOB: [birth year] Today's Date: 12/17/2020  History of Present Illness  79 yo female presents to ED with weakness, slurred speech, AMS. Pt with pancreatic cancer, undergoing chemo. CT head negative for acute intracranial findings. Chest x-ray with mild progression in right hemidiaphragm elevation with mild right basilar atelectasis and question of small right pleural effusion. Workup for AME. Other PMH includes PMH significant for type II DM, atrial fibrillation on anticoagulation, bilat LE DVTs, HLD, HTN.  Clinical Impression   Pt presents with debility, UE and LE distal edema, impaired sitting balance, mod-max difficulty performing bed mobility tasks, and decreased activity tolerance vs baseline. Pt to benefit from acute PT to address deficits. Pt requiring mod +2 assist for moving to and from EOB, declines OOB given significant fatigue. Pt and family express desire for pt to d/c home, unsure of plan (home with HHPT vs hospice), will continue to follow acutely until plan is determined. PT to progress mobility as tolerated.        Recommendations for follow up therapy are one component of a multi-disciplinary discharge planning process, led by the attending physician.  Recommendations may be updated based on patient status, additional functional criteria and insurance authorization.  Follow Up Recommendations Home health PT (vs home with hospice, pending family and MD decision)    Assistance Recommended at Discharge Frequent or constant Supervision/Assistance  Functional Status Assessment Patient has had a recent decline in their functional status and/or demonstrates limited ability to make significant improvements in function in a reasonable and predictable amount of time  Equipment Recommendations  Rolling Bade (2 wheels);BSC/3in1    Recommendations for Other Services       Precautions / Restrictions  Precautions Precautions: Fall Restrictions Weight Bearing Restrictions: No      Mobility  Bed Mobility Overal bed mobility: Needs Assistance Bed Mobility: Supine to Sit;Sit to Supine     Supine to sit: Mod assist;+2 for physical assistance;HOB elevated Sit to supine: Mod assist;+2 for physical assistance;HOB elevated   General bed mobility comments: assist for trunk elevation/lowering, LE lifting into/out of bed, scooting to/from EOB, and boost up in bed upon return to supine.    Transfers                   General transfer comment: unable to assess d/t fatigue    Ambulation/Gait                  Stairs            Wheelchair Mobility    Modified Rankin (Stroke Patients Only)       Balance Overall balance assessment: Needs assistance Sitting-balance support: Bilateral upper extremity supported;Feet supported Sitting balance-Leahy Scale: Poor Sitting balance - Comments: initially BUE and min steadying support. improved to single UE support and min guard.                                     Pertinent Vitals/Pain Pain Assessment: No/denies pain    Home Living Family/patient expects to be discharged to:: Private residence Living Arrangements: Spouse/significant other;Children Available Help at Discharge: Family;Available 24 hours/day (daughter works from home, husband is retired, son works) Type of Home: [employer] Access: Stairs to enter [place]: Right;Left (depending on entrance) Technical brewer of Steps: [place]: Two level;Able to live on main level with bedroom/bathroom  Home Equipment: Shower seat - built in;Grab bars - tub/shower;Toilet riser      Prior Function Prior Level of Function : Needs assist             Mobility Comments: > 2 weeks ago was walking household distances ADLs Comments: > 2 weeks ago independent with basic ADLs     Hand Dominance   Dominant Hand: Right     Extremity/Trunk Assessment   Upper Extremity Assessment Upper Extremity Assessment: Defer to OT evaluation RUE Deficits / Details: weeping edema RUE Coordination: decreased fine motor;decreased gross motor LUE Deficits / Details: weeping edema LUE Coordination: decreased fine motor;decreased gross motor    Lower Extremity Assessment Lower Extremity Assessment: Generalized weakness    Cervical / Trunk Assessment Cervical / Trunk Assessment: Kyphotic  Communication   Communication: No difficulties  Cognition Arousal/Alertness: Awake/alert Behavior During Therapy: WFL for tasks assessed/performed Overall Cognitive Status: Within Functional Limits for tasks assessed                                 General Comments: slow processing        General Comments General comments (skin integrity, edema, etc.): afib rhythm, HR 115-140 bpm; pt with weeping UE edema, RN aware.    Exercises     Assessment/Plan    PT Assessment Patient needs continued PT services  PT Problem List Decreased strength;Decreased mobility;Decreased safety awareness;Decreased activity tolerance;Decreased balance;Decreased knowledge of use of DME;Pain;Decreased skin integrity       PT Treatment Interventions DME instruction;Therapeutic activities;Gait training;Therapeutic exercise;Patient/family education;Balance training;Functional mobility training;Neuromuscular re-education;Stair training    PT Goals (Current goals can be found in the Care Plan section)  Acute Rehab PT Goals Patient Stated Goal: home PT Goal Formulation: With patient/family Time For Goal Achievement: 12/08/2020 Potential to Achieve Goals: Good    Frequency Min 3X/week   Barriers to discharge        Co-evaluation PT/OT/SLP Co-Evaluation/Treatment: Yes Reason for Co-Treatment: For patient/therapist safety;To address functional/ADL transfers PT goals addressed during session: Mobility/safety with mobility;Balance OT  goals addressed during session: ADL's and self-care;Strengthening/ROM       AM-PAC PT "6 Clicks" Mobility  Outcome Measure Help needed turning from your back to your side while in a flat bed without using bedrails?: A Lot Help needed moving from lying on your back to sitting on the side of a flat bed without using bedrails?: A Lot Help needed moving to and from a bed to a chair (including a wheelchair)?: Total Help needed standing up from a chair using your arms (e.g., wheelchair or bedside chair)?: Total Help needed to walk in hospital room?: Total Help needed climbing 3-5 steps with a railing? : Total 6 Click Score: 8    End of Session   Activity Tolerance: Patient limited by fatigue Patient left: in bed;with call bell/phone within reach;with bed alarm set;with family/visitor present;with SCD's reapplied Nurse Communication: Mobility status PT Visit Diagnosis: Other abnormalities of gait and mobility (R26.89);Muscle weakness (generalized) (M62.81)    Time: 3536-1443 PT Time Calculation (min) (ACUTE ONLY): 27 min   Charges:   PT Evaluation $PT Eval Low Complexity: 1 Low        Saba Gomm S, PT DPT Acute Rehabilitation Services Pager [phone]  Office [phone]   Roxine Caddy E Ruffin Pyo 12/17/2020, 2:08 PM

## 2020-12-17 NOTE — Telephone Encounter (Signed)
Pt's family called requesting to speak with Dr Benay Spice in reference to getting conflicting information about the Pt. Informed Pt's family that Dr. Benay Spice Is seeing Pt's and will give therm a return call after he's finished seeing Pt's also was informed by Dr. Benay Spice to let Family of Pt know that he will be doing rounds this afternoon and if they would like to see him he will be there. Spoke with pt's daughter who verbalized understanding.

## 2020-12-17 NOTE — Progress Notes (Signed)
X-cover note  Asked to see patient by bedside RN due to persistent hypotension.  Currently BP 77/47. Telemetry shows afib. HR 98-105.  Pt sleeping. Not tachypneic. RR 9. No distress  Review of chart shows pt with stage 4 metastatic pancreatic cancer, hx of chronic hypotension. Admitted on 111/08/2020 for UTI.  Urine cx pending. Blood cx negative to date.  Pt on midodrine. Also on toprol-xl for presumed rate control for her afib.  IV LR bolus and IV albumin ordered for hypotension.  HgB 7.8 g/dl. Hg was 9.3-9.9 in October 2022.  If hypotension continue despite IVF and IV albumin, will need to consider PRBC transfusion.  Given pt's overall poor prognosis, a palliative care consult/hospice consult may be appropriate.

## 2020-12-18 DIAGNOSIS — G9340 Encephalopathy, unspecified: Secondary | ICD-10-CM | POA: Diagnosis not present

## 2020-12-18 LAB — CBC WITH DIFFERENTIAL/PLATELET
Abs Immature Granulocytes: 0.57 10*3/uL — ABNORMAL HIGH (ref 0.00–0.07)
Basophils Absolute: 0.1 10*3/uL (ref 0.0–0.1)
Basophils Relative: 2 %
Eosinophils Absolute: 0.1 10*3/uL (ref 0.0–0.5)
Eosinophils Relative: 2 %
HCT: 30.4 % — ABNORMAL LOW (ref 36.0–46.0)
Hemoglobin: 10 g/dL — ABNORMAL LOW (ref 12.0–15.0)
Immature Granulocytes: 13 %
Lymphocytes Relative: 15 %
Lymphs Abs: 0.6 10*3/uL — ABNORMAL LOW (ref 0.7–4.0)
MCH: 27.9 pg (ref 26.0–34.0)
MCHC: 32.9 g/dL (ref 30.0–36.0)
MCV: 84.9 fL (ref 80.0–100.0)
Monocytes Absolute: 0.8 10*3/uL (ref 0.1–1.0)
Monocytes Relative: 20 %
Neutro Abs: 2.1 10*3/uL (ref 1.7–7.7)
Neutrophils Relative %: 48 %
Platelets: 36 10*3/uL — ABNORMAL LOW (ref 150–400)
RBC: 3.58 MIL/uL — ABNORMAL LOW (ref 3.87–5.11)
RDW: 16.3 % — ABNORMAL HIGH (ref 11.5–15.5)
Smear Review: NORMAL
WBC: 4.3 10*3/uL (ref 4.0–10.5)
nRBC: 0.9 % — ABNORMAL HIGH (ref 0.0–0.2)

## 2020-12-18 LAB — BASIC METABOLIC PANEL
Anion gap: 7 (ref 5–15)
BUN: 21 mg/dL (ref 8–23)
CO2: 20 mmol/L — ABNORMAL LOW (ref 22–32)
Calcium: 7.1 mg/dL — ABNORMAL LOW (ref 8.9–10.3)
Chloride: 107 mmol/L (ref 98–111)
Creatinine, Ser: 0.48 mg/dL (ref 0.44–1.00)
GFR, Estimated: >60 mL/min (ref >60)
Glucose, Bld: 143 mg/dL — ABNORMAL HIGH (ref 70–99)
Potassium: 3.8 mmol/L (ref 3.5–5.1)
Sodium: 134 mmol/L — ABNORMAL LOW (ref 135–145)

## 2020-12-18 LAB — GLUCOSE, CAPILLARY
Glucose-Capillary: 117 mg/dL — ABNORMAL HIGH (ref 70–99)
Glucose-Capillary: 149 mg/dL — ABNORMAL HIGH (ref 70–99)
Glucose-Capillary: 153 mg/dL — ABNORMAL HIGH (ref 70–99)
Glucose-Capillary: 177 mg/dL — ABNORMAL HIGH (ref 70–99)

## 2020-12-18 LAB — CORTISOL-AM, BLOOD: Cortisol - AM: 21 ug/dL (ref 6.7–22.6)

## 2020-12-18 MED ORDER — DIGOXIN 0.25 MG/ML IJ SOLN
0.1250 mg | Freq: Four times a day (QID) | INTRAMUSCULAR | Status: AC
  Administered 2020-12-18 (×2): 0.125 mg via INTRAVENOUS
  Filled 2020-12-18 (×2): qty 0.5

## 2020-12-18 MED ORDER — MIDODRINE HCL 5 MG PO TABS
10.0000 mg | ORAL_TABLET | Freq: Three times a day (TID) | ORAL | Status: DC
  Administered 2020-12-18 – 2020-12-22 (×13): 10 mg via ORAL
  Filled 2020-12-18 (×13): qty 2

## 2020-12-18 NOTE — Progress Notes (Signed)
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  Daughter reported progressive generalized weakness and fatigue since getting chemotherapy on 12/10/2020.  Too weak to even ambulate.  Pt seen at the cancer center and directed to the ED for further evaluation.    In the ED, patient afebrile, in A-fib RVR with HR as high as 130s, and BP in the 02V to 25D systolic.   Head CT negative for acute intracranial abnormality.  Chest x-ray with mild progression in right hemidiaphragm elevation with mild right basilar atelectasis .Labs notable for sodium 126, and elevated BUN to creatinine ratio.  UA noted pyuria, blood cultures were collected in the emergency department, given 2.5 L LR, and started on vancomycin, Azactam, and Flagyl.   Admitted to [facility] service.  Assessment & Plan   Acute metabolic encephalopathy - -Likely multifactorial, possibly dehydration,?  UTI -no overt symptoms but pyuria noted on urinalysis -Head CT negative, ammonia level normal, no focal neurologic deficits. -Mental status is improved -Treated with empiric antibiotics, blood cultures are negative, influenza and COVID PCR negative -Vancomycin discontinued, fluids stopped due to ongoing severe third spacing -Remains on IV aztreonam, urine cultures did not get sent unfortunately, has completed 5 days of aztreonam regardless  Hypotension, lactic acidosis - -Also multifactorial, has chronic hypotension, worsened by A. fib, third spacing, dehydration following chemotherapy and cancer progression -IV fluids  discontinued due to profound third spacing, started on midodrine, dose increased -Antibiotics as above, blood cultures are negative, unfortunately urine cultures sent but has completed 5. 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  Diagnosed in December 2021.  Status post SBRT and modified FOLFIRINOX. Had cycle 1 of gemcitabine and Abraxane on 12/10/20. --Continue Creon -Oncology following -Overall prognosis appears to be very poor in the setting of her age, frailty, hemodynamic instability, severe hypoalbuminemia with third spacing in the background of progressive pancreatic CA -Ca 19-9 went to 11k from 1.5K 1 month ago, indicating rapid disease progression -I do not think patient will be in any condition to tolerate further chemotherapy -Goals of care discussions ongoing per Dr. Benay Spice -anticipate need for Home Hospice soon  A. fib with RVR - -Added low-dose Toprol with midodrine as blood pressure tolerates -trial of low dose digoxin  Hyponatremia -  -Improving with hydration, has a component of chronic hyponatremia too  Hypomagnesemia - replaced   Anemia / Thrombocytopenia - likely due to chemotherapy, cancer -stable  Anasarca / Hypoalbuminemia - Albumin 1.6.  Given IV albumin multiple times this admission including overnight  -Poor prognosis  Oral ulcers - due to chemo.  On magic mouthwash and topical vaseline  Insulin-dependent DM - insulin coverage and CBG's. Adjust insulin for inpatient goal 140-180  Gastritis - seen on EGD 12/04/2020.   --Continue PPI and Carafate   DVT prophylaxis: SCD's due to thrombocytopenia    Code Status: DNR  Family communication: No family at bedside today, discussed with daughter and son at bedside yesterday Disposition: Anticipate need  for home hospice  Subjective 12/18/20    -Feels weak, tired, ate a few bites  of banana this morning today   Disposition Plan & Communication   Status is: Inpatient  Remains inpatient appropriate because: severity of illness as outlined above.   Consults, Procedures, Significant Events   Consultants:  Oncology  Procedures:  none  Antimicrobials:  Anti-infectives (From admission, onward)    Start     Dose/Rate Route Frequency Ordered Stop   12/16/20 2000  vancomycin (VANCOREADY) IVPB 1500 mg/300 mL  Status:  Discontinued        1,500 mg 150 mL/hr over 120 Minutes Intravenous Every 24 hours 12/16/20 1040 12/16/20 1432   12/15/20 1700  vancomycin (VANCOREADY) IVPB 1000 mg/200 mL  Status:  Discontinued        1,000 mg 200 mL/hr over 60 Minutes Intravenous Every 24 hours 12/14/20 1616 12/16/20 1040   12/15/20 0800  metroNIDAZOLE (FLAGYL) IVPB 500 mg        500 mg 100 mL/hr over 60 Minutes Intravenous Every 12 hours 12/14/20 2225 12/22/20 0759   12/14/20 2200  aztreonam (AZACTAM) 2 g in sodium chloride 0.9 % 100 mL IVPB        2 g 200 mL/hr over 30 Minutes Intravenous Every 8 hours 12/14/20 1616     12/14/20 1630  vancomycin (VANCOREADY) IVPB 1000 mg/200 mL        1,000 mg 200 mL/hr over 60 Minutes Intravenous  Once 12/14/20 1548 12/14/20 1814   12/14/20 1600  aztreonam (AZACTAM) 2 g in sodium chloride 0.9 % 100 mL IVPB        2 g 200 mL/hr over 30 Minutes Intravenous  Once 12/14/20 1548 12/14/20 2034   12/14/20 1600  metroNIDAZOLE (FLAGYL) IVPB 500 mg        500 mg 100 mL/hr over 60 Minutes Intravenous  Once 12/14/20 1548 12/14/20 2200       Objective   Vitals:   12/18/20 0800 12/18/20 0830 12/18/20 0930 12/18/20 1033  BP: 99/70 99/70 (!) 88/58 (!) 82/50  Pulse: (!) 125 (!) 123 (!) 127 (!) 120  Resp: 14 14 11 12   Temp: 99 F (37.2 C) 97.6 F (36.4 C) (!) 97 F (36.1 C) 97.7 F (36.5 C)  TempSrc: Oral Oral Oral Oral  SpO2: 99% 97%    Weight:      Height:        Intake/Output Summary (Last 24 hours) at 12/18/2020 1119 Last data filed  at 12/17/2020 2106 Gross per 24 hour  Intake 405.99 ml  Output 650 ml  Net -244.01 ml   Filed Weights   12/16/20 0433 12/17/20 0459 12/18/20 0500  Weight: 71.2 kg 72 kg (P) 72 kg   Chronically ill elderly female laying in bed, awake alert, oriented to self and place HEENT: No JVD CVS: S1-S2, irregularly irregular rhythm Lungs: Decreased breath sounds the bases Abdomen: Soft, nontender, nondistended, bowel sounds present Extremities: 2+ edema with third spacing in upper and lower extremities Skin: no new rashes on exposed skin   Labs   Data Reviewed: I have personally reviewed following labs and imaging studies  CBC: Recent Labs  Lab 12/14/20 1340 12/14/20 1340 12/14/20 1635 12/14/20 1810 12/15/20 0235 12/15/20 0945 12/16/20 0256 12/17/20 0333  WBC 9.4  --  6.6  --  3.5* 3.0* 1.8* 1.4*  1.6*  NEUTROABS 9.0*  --   --   --   --   --  1.3* 0.6*  HGB 7.9*   < >  8.5* 9.2* 6.2* 7.6* 7.8* 8.4*  8.5*  HCT 23.7*  --  25.7* 27.0* 18.0* 22.4* 22.5* 24.4*  25.3*  MCV 81.7  --  85.1  --  82.9 82.7 82.7 82.4  84.3  PLT 79*  --  63*  --  41* 37* 26* 20*  22*   < > = values in this interval not displayed.   Basic Metabolic Panel: Recent Labs  Lab 12/14/20 1635 12/14/20 1810 12/15/20 0235 12/16/20 0256 12/17/20 0333 12/18/20 0227  NA 126* 126* 128* 129* 131* 134*  K 4.0 4.1 3.7 3.2* 3.5 3.8  CL 98  --  101 102 106 107  CO2 18*  --  19* 20* 20* 20*  GLUCOSE 193*  --  168* 123* 131* 143*  BUN 28*  --  26* 21 20 21   CREATININE 0.66  --  0.53 0.49 0.44 0.48  CALCIUM 7.5*  --  7.3* 7.0* 7.1* 7.1*  MG  --   --  1.6* 1.8  --   --    GFR: Estimated Creatinine Clearance: 53.3 mL/min (by C-G formula based on SCr of 0.48 mg/dL). Liver Function Tests: Recent Labs  Lab 12/14/20 1340 12/15/20 0235 12/16/20 0256 12/17/20 0333  AST 32 42* 55* 55*  ALT 26 26 31  38  ALKPHOS 213* 169* 186* 245*  BILITOT 2.8* 2.8* 2.9* 2.0*  PROT 4.9* 4.1* 3.9* 4.0*  ALBUMIN 1.9* 1.8* 1.6*  1.7*   No results for input(s): LIPASE, AMYLASE in the last 168 hours. Recent Labs  Lab 12/14/20 1340 12/14/20 1635  AMMONIA 84* 32   Coagulation Profile: Recent Labs  Lab 12/14/20 1635  INR 1.5*   Cardiac Enzymes: No results for input(s): CKTOTAL, CKMB, CKMBINDEX, TROPONINI in the last 168 hours. BNP (last 3 results) No results for input(s): PROBNP in the last 8760 hours. HbA1C: No results for input(s): HGBA1C in the last 72 hours. CBG: Recent Labs  Lab 12/17/20 0757 12/17/20 2117 12/17/20 2353 12/18/20 0421 12/18/20 0851  GLUCAP 94 172* 139* 117* 149*   Lipid Profile: No results for input(s): CHOL, HDL, LDLCALC, TRIG, CHOLHDL, LDLDIRECT in the last 72 hours. Thyroid Function Tests: No results for input(s): TSH, T4TOTAL, FREET4, T3FREE, THYROIDAB in the last 72 hours.  Anemia Panel: No results for input(s): VITAMINB12, FOLATE, FERRITIN, TIBC, IRON, RETICCTPCT in the last 72 hours. Sepsis Labs: Recent Labs  Lab 12/14/20 1635 12/14/20 2000 12/15/20 0235 12/16/20 0256  LATICACIDVEN 2.6* 2.7* 2.1* 2.0*    Recent Results (from the past 240 hour(s))  Resp Panel by RT-PCR (Flu A&B, Covid) Nasopharyngeal Swab     Status: None   Collection Time: 12/14/20  3:44 PM   Specimen: Nasopharyngeal Swab; Nasopharyngeal(NP) swabs in vial transport medium  Result Value Ref Range Status   SARS Coronavirus 2 by RT PCR NEGATIVE NEGATIVE Final    Comment: (NOTE) SARS-CoV-2 target nucleic acids are NOT DETECTED.  The SARS-CoV-2 RNA is generally detectable in upper respiratory specimens during the acute phase of infection. The lowest concentration of SARS-CoV-2 viral copies this assay can detect is 138 copies/mL. A negative result does not preclude SARS-Cov-2 infection and should not be used as the sole basis for treatment or other patient management decisions. A negative result may occur with  improper specimen collection/handling, submission of specimen other than  nasopharyngeal swab, presence of viral mutation(s) within the areas targeted by this assay, and inadequate number of viral copies(<138 copies/mL). A negative result must be combined with clinical observations, patient history, and  epidemiological information. The expected result is Negative.  Fact Sheet for Patients:  EntrepreneurPulse.com.au  Fact Sheet for Healthcare Providers:  IncredibleEmployment.be  This test is no t yet approved or cleared by the [place] FDA and  has been authorized for detection and/or diagnosis of SARS-CoV-2 by FDA under an Emergency Use Authorization (EUA). This EUA will remain  in effect (meaning this test can be used) for the duration of the COVID-19 declaration under Section 564(b)(1) of the Act, 21 U.S.C.section 360bbb-3(b)(1), unless the authorization is terminated  or revoked sooner.       Influenza A by PCR NEGATIVE NEGATIVE Final   Influenza B by PCR NEGATIVE NEGATIVE Final    Comment: (NOTE) The Xpert Xpress SARS-CoV-2/FLU/RSV plus assay is intended as an aid in the diagnosis of influenza from Nasopharyngeal swab specimens and should not be used as a sole basis for treatment. Nasal washings and aspirates are unacceptable for Xpert Xpress SARS-CoV-2/FLU/RSV testing.  Fact Sheet for Patients: EntrepreneurPulse.com.au  Fact Sheet for Healthcare Providers: IncredibleEmployment.be  This test is not yet approved or cleared by the [place] FDA and has been authorized for detection and/or diagnosis of SARS-CoV-2 by FDA under an Emergency Use Authorization (EUA). This EUA will remain in effect (meaning this test can be used) for the duration of the COVID-19 declaration under Section 564(b)(1) of the Act, 21 U.S.C. section 360bbb-3(b)(1), unless the authorization is terminated or revoked.  Performed at [facility], [place] [address], [place], [place] [address]    Blood culture (routine x 2)     Status: None (Preliminary result)   Collection Time: 12/14/20  4:30 PM   Specimen: BLOOD  Result Value Ref Range Status   Specimen Description BLOOD RIGHT ANTECUBITAL  Final   Special Requests   Final    BOTTLES DRAWN AEROBIC AND ANAEROBIC Blood Culture adequate volume   Culture   Final    NO GROWTH 4 DAYS Performed at [facility], [place] [address], [place], [place] [address]    Report Status PENDING  Incomplete  Blood culture (routine x 2)     Status: None (Preliminary result)   Collection Time: 12/14/20  4:35 PM   Specimen: BLOOD  Result Value Ref Range Status   Specimen Description BLOOD RIGHT ANTECUBITAL  Final   Special Requests   Final    BOTTLES DRAWN AEROBIC AND ANAEROBIC Blood Culture adequate volume   Culture   Final    NO GROWTH 4 DAYS Performed at [facility], [place] [address] [place], [place] [address]    Report Status PENDING  Incomplete     Medications   Scheduled Meds:  acidophilus  1 capsule Oral Daily   Chlorhexidine Gluconate Cloth  6 each Topical Daily   feeding supplement  237 mL Oral BID BM   insulin aspart  0-9 Units Subcutaneous Q4H   lipase/protease/amylase  36,000 Units Oral TID AC   loratadine  10 mg Oral Daily   metoprolol succinate  12.5 mg Oral Daily   midodrine  10 mg Oral TID WC   multivitamin with minerals  1 tablet Oral Daily   pantoprazole  40 mg Oral Daily   sodium chloride flush  3 mL Intravenous Q12H   sucralfate  1 g Oral BID WC   Continuous Infusions:  aztreonam 2 g (12/18/20 1041)   metronidazole 500 mg (12/18/20 0821)    LOS: 4 days    Time spent: 35 minutes  Domenic Polite, MD [facility]  12/18/2020, 11:19 AM

## 2020-12-18 NOTE — Progress Notes (Signed)
IP PROGRESS NOTE  Subjective:   [name] appears unchanged.  No pain.  No bleeding.  Objective: Vital signs in last 24 hours: Blood pressure (!) 78/60, pulse (!) 120, temperature (!) 97.5 F (36.4 C), temperature source Oral, resp. rate 12, height 5\' 3"  (1.6 m), weight (P) 158 lb 11.7 oz (72 kg), SpO2 98 %.  Intake/Output from previous day: 11/10 0701 - 11/11 0700 In: 406 [P.O.:200; IV Piggyback:206] Out: 650 [Urine:650]  Physical Exam:  HEENT: Dried blood at the lower lip, oral cavity without bleeding or thrush Lungs: Clear anteriorly Cardiac: Irregular, tachycardia Abdomen: Distended with ascites, nontender Extremities: Pitting edema in the arms and legs Neurologic: Alert and oriented, follows commands  Portacath/PICC-without erythema  Lab Results: Recent Labs    12/16/20 0256 12/17/20 0333  WBC 1.8* 1.4*  1.6*  HGB 7.8* 8.4*  8.5*  HCT 22.5* 24.4*  25.3*  PLT 26* 20*  22*    BMET Recent Labs    12/17/20 0333 12/18/20 0227  NA 131* 134*  K 3.5 3.8  CL 106 107  CO2 20* 20*  GLUCOSE 131* 143*  BUN 20 21  CREATININE 0.44 0.48  CALCIUM 7.1* 7.1*    Lab Results  Component Value Date   [id number] 11,154 (H) 12/01/2020    Studies/Results: No results found.  Medications: I have reviewed the patient's current medications.  Assessment/Plan:  Pancreas cancer, borderline resectable CT abdomen/pelvis-inflammation involving the mid body of the pancreas with peripancreatic fat stranding; possible 2.5 x 2.2 cm low-attenuation mass within the mid body of the pancreas, appears to abut the celiac axis as well as the splenic vein.   MRI 11/18/2019-extensive inflammatory fat stranding about the pancreas with a thinly septated multicystic fluid signal lesion of the anterior pancreatic neck measuring 3.2 x 1.7 cm most consistent with a pancreatic pseudocyst; numerous additional small fluid signal lesions throughout the pancreatic parenchyma, the remaining lesions  subcentimeter; central portion of pancreatic duct dilated measuring up to 8 mm without obvious obstructing lesion identified; trace ascites; trace bilateral pleural effusions and associated atelectasis or consolidation.   Colonoscopy 11/20/2019-5 polyps (resected and retrieved- multiple tubular adenomas, negative for high-grade dysplasia); 2 colonic angioectasias; diverticulosis in the entire examined colon; nonbleeding nonthrombosed external and internal hemorrhoids.   EGD/EUS 11/20/2019-few white nummular lesions in the entire esophagus (esophagus biopsy with no significant pathologic findings, no fungal elements); hematin found in the entire examined stomach; gastritis (stomach biopsy showed gastropathy/gastritis); 14 mm semisessile polyp found in the posterior wall of the gastric antrum (hyperplastic polyp); a lesion was identified in the pancreatic body staged T2 N0 MX.  FNA of the pancreas lesion showed benign reactive/reparative changes, prominent inflammation.   EUS procedure 01/20/2020-masslike region identified in the pancreatic head, stage T2 N0 MX.  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 CTs chest/pelvis 01/29/2020-no evidence of metastatic disease CT abdomen/pelvis 03/05/2020-increase in size of pancreas head/uncinate mass, marked narrowing of the adjacent portal vein, patchy consolidation in the right lower lung, mild intrahepatic and extrahepatic biliary ductal dilatation Cycle 1 FOLFOX 03/23/2020 Cycle 2 FOLFOX 04/06/2020, Emend added for delayed nausea after cycle 1 Cycle 3 FOLFIRINOX 04/20/2020 Cycle 4 FOLFIRINOX 05/04/2020, Udenyca Cycle 5 FOLFIRINOX 05/18/2020, Udenyca Cycle 6 FOLFIRINOX 06/02/2020, Udenyca CTs 06/15/2020- decreased size of pancreatic mass, still with portal vein involvement and extensive upper abdominal collaterals. 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 Cycle 1 gemcitabine/Abraxane 12/10/2020 Pancreatitis October 2021 Diabetes October 2021 Isolated episode of atrial fibrillation approximately 15 years ago, recurrent atrial fibrillation 12/14/2020 Hypertension COVID-19 infection Admission 03/05/2020 with biliary obstruction-ERCP 03/09/2020, biliary obstruction secondary to known pancreatic head mass found in the lower third of the main duct.  The upper third of the main bile duct and left and right hepatic ducts and all intrahepatic branches moderately dilated secondary to the stricture.  Biliary sphincterotomy performed.  Covered metal biliary stent placed into the common bile duct. Port-A-Cath placement 03/11/2020, Dr. Zenia Hawkins Oxaliplatin neuropathy Hospital admission 12/02/2020-GI bleed Bilateral lower extremity DVTs 12/01/2020-Lovenox Lovenox held on hospital admission 12/02/2020 Heparin 12/07/2020 Lovenox resumed Lovenox held 12/15/2020 secondary to thrombocytopenia Esophageal varices Admission 12/14/2020 with altered mental status and dehydration Mouth/lip ulcers-secondary to dehydration versus mucositis from chemotherapy Pancytopenia with neutropenia/thrombocytopenia secondary to chemotherapy   [name] appears stable.  She is now at day 10 following cycle 1 gemcitabine/Abraxane.  The neutrophil count is lower today.  The platelets are stable.  I recommend transfusing platelets for a count of less than 10,000 or bleeding.  There is no indication for G-CSF at present.  Her mental status has returned to baseline.  No source for infection has been identified.  Blood cultures remain negative.  She is afebrile.  The elevated bilirubin and lactate are likely related to the metastatic pancreas cancer.  [name] has metastatic pancreas cancer.  She understands no therapy  will be curative.  She will not be a candidate for further chemotherapy unless her performance status improves.  We discussed the plan for home hospice care if her appetite and ambulation do not improve through the weekend.  I feel we should support her through the chemotherapy hematologic toxicity and then decide on hospice care.  I discussed the prognosis and disposition plans in detail with her husband and son at the bedside on 12/17/2020. Recommendations: Continue intravenous antibiotics, follow-up cultures Management of atrial fibrillation and hypotension per the medical service Monitor CBC/differential daily Hold Lovenox due to the thrombocytopenia Transfuse platelets for a count of less than 10,000 or bleeding Please call oncology as needed over the weekend.  I will check on her 12/21/2020.  Appreciate the care from Dr. Broadus John.      LOS: 4 days   [name], MD   12/18/2020, 3:49 PM

## 2020-12-19 DIAGNOSIS — G9340 Encephalopathy, unspecified: Secondary | ICD-10-CM | POA: Diagnosis not present

## 2020-12-19 LAB — COMPREHENSIVE METABOLIC PANEL
ALT: 28 U/L (ref 0–44)
AST: 29 U/L (ref 15–41)
Albumin: <1.5 g/dL — ABNORMAL LOW (ref 3.5–5.0)
Alkaline Phosphatase: 270 U/L — ABNORMAL HIGH (ref 38–126)
Anion gap: 6 (ref 5–15)
BUN: 22 mg/dL (ref 8–23)
CO2: 19 mmol/L — ABNORMAL LOW (ref 22–32)
Calcium: 7.1 mg/dL — ABNORMAL LOW (ref 8.9–10.3)
Chloride: 106 mmol/L (ref 98–111)
Creatinine, Ser: 0.51 mg/dL (ref 0.44–1.00)
GFR, Estimated: >60 mL/min (ref >60)
Glucose, Bld: 167 mg/dL — ABNORMAL HIGH (ref 70–99)
Potassium: 3.8 mmol/L (ref 3.5–5.1)
Sodium: 131 mmol/L — ABNORMAL LOW (ref 135–145)
Total Bilirubin: 1.6 mg/dL — ABNORMAL HIGH (ref 0.3–1.2)
Total Protein: 3.8 g/dL — ABNORMAL LOW (ref 6.5–8.1)

## 2020-12-19 LAB — CBC WITH DIFFERENTIAL/PLATELET
Abs Immature Granulocytes: 0.96 10*3/uL — ABNORMAL HIGH (ref 0.00–0.07)
Basophils Absolute: 0.1 10*3/uL (ref 0.0–0.1)
Basophils Relative: 1 %
Eosinophils Absolute: 0.1 10*3/uL (ref 0.0–0.5)
Eosinophils Relative: 3 %
HCT: 29.3 % — ABNORMAL LOW (ref 36.0–46.0)
Hemoglobin: 10 g/dL — ABNORMAL LOW (ref 12.0–15.0)
Immature Granulocytes: 18 %
Lymphocytes Relative: 16 %
Lymphs Abs: 0.9 10*3/uL (ref 0.7–4.0)
MCH: 28.8 pg (ref 26.0–34.0)
MCHC: 34.1 g/dL (ref 30.0–36.0)
MCV: 84.4 fL (ref 80.0–100.0)
Monocytes Absolute: 0.7 10*3/uL (ref 0.1–1.0)
Monocytes Relative: 13 %
Neutro Abs: 2.7 10*3/uL (ref 1.7–7.7)
Neutrophils Relative %: 49 %
Platelets: 41 10*3/uL — ABNORMAL LOW (ref 150–400)
RBC: 3.47 MIL/uL — ABNORMAL LOW (ref 3.87–5.11)
RDW: 16.6 % — ABNORMAL HIGH (ref 11.5–15.5)
Smear Review: DECREASED
WBC Morphology: INCREASED
WBC: 5.4 10*3/uL (ref 4.0–10.5)
nRBC: 0.6 % — ABNORMAL HIGH (ref 0.0–0.2)

## 2020-12-19 LAB — CULTURE, BLOOD (ROUTINE X 2)
Culture: NO GROWTH
Culture: NO GROWTH
Special Requests: ADEQUATE
Special Requests: ADEQUATE

## 2020-12-19 LAB — GLUCOSE, CAPILLARY
Glucose-Capillary: 139 mg/dL — ABNORMAL HIGH (ref 70–99)
Glucose-Capillary: 149 mg/dL — ABNORMAL HIGH (ref 70–99)
Glucose-Capillary: 150 mg/dL — ABNORMAL HIGH (ref 70–99)
Glucose-Capillary: 171 mg/dL — ABNORMAL HIGH (ref 70–99)
Glucose-Capillary: 176 mg/dL — ABNORMAL HIGH (ref 70–99)
Glucose-Capillary: 190 mg/dL — ABNORMAL HIGH (ref 70–99)
Glucose-Capillary: 208 mg/dL — ABNORMAL HIGH (ref 70–99)
Glucose-Capillary: 228 mg/dL — ABNORMAL HIGH (ref 70–99)

## 2020-12-19 MED ORDER — SODIUM CHLORIDE 0.9% FLUSH: 10.0000 mL | INTRAVENOUS | Status: DC | PRN

## 2020-12-19 MED ORDER — DIGOXIN 0.25 MG/ML IJ SOLN
0.1250 mg | Freq: Four times a day (QID) | INTRAMUSCULAR | Status: AC
  Administered 2020-12-19 (×2): 0.125 mg via INTRAVENOUS
  Filled 2020-12-19 (×2): qty 0.5

## 2020-12-19 NOTE — Progress Notes (Signed)
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  Daughter reported progressive generalized weakness and fatigue since getting chemotherapy on 12/10/2020.  Too weak to even ambulate.  Pt seen at the cancer center and directed to the ED for further evaluation.    In the ED, patient afebrile, in A-fib RVR with HR as high as 130s, and BP in the 18E to 99B systolic.   Head CT negative for acute intracranial abnormality.  Chest x-ray with mild progression in right hemidiaphragm elevation with mild right basilar atelectasis .Labs notable for sodium 126, and elevated BUN to creatinine ratio.  UA noted pyuria, blood cultures were collected in the emergency department, given 2.5 L LR, and started on vancomycin, Azactam, and Flagyl.   Admitted to [facility] service.  Assessment & Plan   Transient confusion, acute metabolic encephalopathy -Resolved -Likely multifactorial, possibly dehydration,?  UTI -no overt symptoms but pyuria noted on urinalysis -Head CT negative, ammonia level normal, no focal neurologic deficits. -Treated with empiric antibiotics, blood cultures are negative, influenza and COVID PCR negative -Vancomycin discontinued, fluids stopped due to ongoing severe third spacing -Remains on IV aztreonam, urine cultures did not get sent unfortunately, has completed 6 days of aztreonam regardless  Hypotension, lactic acidosis - -Also multifactorial, has chronic hypotension, worsened by A. 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  Diagnosed in December 2021.  Status post SBRT and modified FOLFIRINOX. Had cycle 1 of gemcitabine and Abraxane on 12/10/20. -Oncology following -Overall prognosis appears to be very poor in the setting of her age, frailty, hemodynamic instability, severe hypoalbuminemia with third spacing in the background of progressive pancreatic CA -Ca 19-9 went to 11k from 1.5K 1 month ago, indicating rapid disease progression -I doubt she will be able to tolerate further chemotherapy -Goals of care discussions ongoing per Dr. Benay Spice -anticipate need for Home Hospice soon, Dr. Benay Spice to reassess on Monday 11/2  A. fib with RVR - - low-dose Toprol with midodrine if blood pressure tolerates -Low-dose digoxin again today -Anticoagulation in the setting of severe thrombocytopenia  Hyponatremia -  -Improving with hydration, has a component of chronic hyponatremia too  Hypomagnesemia - replaced   Anemia / Thrombocytopenia - likely due to chemotherapy, cancer -stable  Anasarca / Hypoalbuminemia - Albumin 1.6.  Given IV albumin multiple times this admission including overnight  -Poor prognosis  Oral ulcers - due to chemo.  On magic mouthwash and topical vaseline  Insulin-dependent DM - insulin coverage and CBG's. Adjust insulin for inpatient goal 140-180  Gastritis - seen on EGD 12/04/2020.   --Continue PPI and Carafate   DVT prophylaxis: SCD's due to thrombocytopenia    Code Status: DNR  Family communication: No family at  bedside today, discussed with daughter and son at bedside  yesterday Disposition: Anticipate need for home hospice, pending reassessment by oncologist on Monday  Subjective 12/19/20    -Reports feeling weak and tired, had a few bites of applesauce this morning  Consults, Procedures, Significant Events   Consultants:  Oncology  Procedures:  none  Antimicrobials:  Anti-infectives (From admission, onward)    Start     Dose/Rate Route Frequency Ordered Stop   12/16/20 2000  vancomycin (VANCOREADY) IVPB 1500 mg/300 mL  Status:  Discontinued        1,500 mg 150 mL/hr over 120 Minutes Intravenous Every 24 hours 12/16/20 1040 12/16/20 1432   12/15/20 1700  vancomycin (VANCOREADY) IVPB 1000 mg/200 mL  Status:  Discontinued        1,000 mg 200 mL/hr over 60 Minutes Intravenous Every 24 hours 12/14/20 1616 12/16/20 1040   12/15/20 0800  metroNIDAZOLE (FLAGYL) IVPB 500 mg        500 mg 100 mL/hr over 60 Minutes Intravenous Every 12 hours 12/14/20 2225 12/22/20 0759   12/14/20 2200  aztreonam (AZACTAM) 2 g in sodium chloride 0.9 % 100 mL IVPB        2 g 200 mL/hr over 30 Minutes Intravenous Every 8 hours 12/14/20 1616     12/14/20 1630  vancomycin (VANCOREADY) IVPB 1000 mg/200 mL        1,000 mg 200 mL/hr over 60 Minutes Intravenous  Once 12/14/20 1548 12/14/20 1814   12/14/20 1600  aztreonam (AZACTAM) 2 g in sodium chloride 0.9 % 100 mL IVPB        2 g 200 mL/hr over 30 Minutes Intravenous  Once 12/14/20 1548 12/14/20 2034   12/14/20 1600  metroNIDAZOLE (FLAGYL) IVPB 500 mg        500 mg 100 mL/hr over 60 Minutes Intravenous  Once 12/14/20 1548 12/14/20 2200       Objective   Vitals:   12/19/20 0133 12/19/20 0358 12/19/20 0500 12/19/20 0514  BP: (!) 78/59 (!) 87/64  (!) 87/64  Pulse:  (!) 104  (!) 104  Resp:  12  13  Temp:  97.7 F (36.5 C)  97.7 F (36.5 C)  TempSrc:    Oral  SpO2:      Weight:   76.5 kg   Height:       No intake or output data in the 24 hours ending 12/19/20 1139  Filed Weights   12/17/20 0459 12/18/20 0500  12/19/20 0500  Weight: 72 kg (P) 72 kg 76.5 kg   Chronically ill elderly female, laying in bed, awake alert oriented to self and place HEENT: No JVD CVS: S1-S2, irregularly irregular rhythm  Lungs: Decreased breath sounds to bases Abdomen: Soft, nontender, mildly distended, bowel sounds present Extremities: 1-2+ edema with third spacing in upper and lower extremities  Skin: no new rashes on exposed skin   Labs   Data Reviewed: I have personally reviewed following labs and imaging studies  CBC: Recent Labs  Lab 12/14/20 1340 12/14/20 1635 12/15/20 0945 12/16/20 0256 12/17/20 0333 12/18/20 1518 12/19/20 0254  WBC 9.4   < > 3.0* 1.8* 1.4*  1.6* 4.3 5.4  NEUTROABS 9.0*  --   --  1.3* 0.6* 2.1 2.7  HGB 7.9*   < > 7.6* 7.8* 8.4*  8.5* 10.0* 10.0*  HCT 23.7*   < > 22.4* 22.5* 24.4*  25.3* 30.4* 29.3*  MCV 81.7   < > 82.7 82.7 82.4  84.3 84.9 84.4  PLT 79*   < > 37* 26* 20*  22* 36* 41*   < > = values in this interval not displayed.   Basic Metabolic Panel: Recent Labs  Lab 12/15/20 0235 12/16/20 0256 12/17/20 0333 12/18/20 0227 12/19/20 0254  NA 128* 129* 131* 134* 131*  K 3.7 3.2* 3.5 3.8 3.8  CL 101 102 106 107 106  CO2 19* 20* 20* 20* 19*  GLUCOSE 168* 123* 131* 143* 167*  BUN 26* 21 20 21 22   CREATININE 0.53 0.49 0.44 0.48 0.51  CALCIUM 7.3* 7.0* 7.1* 7.1* 7.1*  MG 1.6* 1.8  --   --   --    GFR: Estimated Creatinine Clearance: 54.9 mL/min (by C-G formula based on SCr of 0.51 mg/dL). Liver Function Tests: Recent Labs  Lab 12/14/20 1340 12/15/20 0235 12/16/20 0256 12/17/20 0333 12/19/20 0254  AST 32 42* 55* 55* 29  ALT 26 26 31  38 28  ALKPHOS 213* 169* 186* 245* 270*  BILITOT 2.8* 2.8* 2.9* 2.0* 1.6*  PROT 4.9* 4.1* 3.9* 4.0* 3.8*  ALBUMIN 1.9* 1.8* 1.6* 1.7* <1.5*   No results for input(s): LIPASE, AMYLASE in the last 168 hours. Recent Labs  Lab 12/14/20 1340 12/14/20 1635  AMMONIA 84* 32   Coagulation Profile: Recent Labs  Lab  12/14/20 1635  INR 1.5*   Cardiac Enzymes: No results for input(s): CKTOTAL, CKMB, CKMBINDEX, TROPONINI in the last 168 hours. BNP (last 3 results) No results for input(s): PROBNP in the last 8760 hours. HbA1C: No results for input(s): HGBA1C in the last 72 hours. CBG: Recent Labs  Lab 12/18/20 1542 12/18/20 2110 12/18/20 2359 12/19/20 0441 12/19/20 0813  GLUCAP 177* 208* 176* 139* 149*   Lipid Profile: No results for input(s): CHOL, HDL, LDLCALC, TRIG, CHOLHDL, LDLDIRECT in the last 72 hours. Thyroid Function Tests: No results for input(s): TSH, T4TOTAL, FREET4, T3FREE, THYROIDAB in the last 72 hours.  Anemia Panel: No results for input(s): VITAMINB12, FOLATE, FERRITIN, TIBC, IRON, RETICCTPCT in the last 72 hours. Sepsis Labs: Recent Labs  Lab 12/14/20 1635 12/14/20 2000 12/15/20 0235 12/16/20 0256  LATICACIDVEN 2.6* 2.7* 2.1* 2.0*    Recent Results (from the past 240 hour(s))  Resp Panel by RT-PCR (Flu A&B, Covid) Nasopharyngeal Swab     Status: None   Collection Time: 12/14/20  3:44 PM   Specimen: Nasopharyngeal Swab; Nasopharyngeal(NP) swabs in vial transport medium  Result Value Ref Range Status   SARS Coronavirus 2 by RT PCR NEGATIVE NEGATIVE Final    Comment: (NOTE) SARS-CoV-2 target nucleic acids are NOT DETECTED.  The SARS-CoV-2 RNA is generally detectable in upper respiratory specimens during the acute phase of infection. The lowest concentration of SARS-CoV-2 viral copies this assay can detect is 138 copies/mL. A negative result does not preclude SARS-Cov-2 infection and should not be used as the sole basis for treatment or other patient management decisions. A negative result may occur with  improper specimen collection/handling, submission of specimen other than nasopharyngeal swab, presence of viral mutation(s) within the areas targeted by this assay, and inadequate number of viral copies(<138 copies/mL). A negative result must be combined  with clinical observations, patient history, and epidemiological information. The expected result is Negative.  Fact Sheet for Patients:  EntrepreneurPulse.com.au  Fact Sheet for Healthcare Providers:  IncredibleEmployment.be  This test is no t yet approved or cleared by the [place] FDA and  has been authorized for detection and/or diagnosis of SARS-CoV-2 by FDA under an Emergency Use Authorization (EUA). This EUA  will remain  in effect (meaning this test can be used) for the duration of the COVID-19 declaration under Section 564(b)(1) of the Act, 21 U.S.C.section 360bbb-3(b)(1), unless the authorization is terminated  or revoked sooner.       Influenza A by PCR NEGATIVE NEGATIVE Final   Influenza B by PCR NEGATIVE NEGATIVE Final    Comment: (NOTE) The Xpert Xpress SARS-CoV-2/FLU/RSV plus assay is intended as an aid in the diagnosis of influenza from Nasopharyngeal swab specimens and should not be used as a sole basis for treatment. Nasal washings and aspirates are unacceptable for Xpert Xpress SARS-CoV-2/FLU/RSV testing.  Fact Sheet for Patients: EntrepreneurPulse.com.au  Fact Sheet for Healthcare Providers: IncredibleEmployment.be  This test is not yet approved or cleared by the [place] FDA and has been authorized for detection and/or diagnosis of SARS-CoV-2 by FDA under an Emergency Use Authorization (EUA). This EUA will remain in effect (meaning this test can be used) for the duration of the COVID-19 declaration under Section 564(b)(1) of the Act, 21 U.S.C. section 360bbb-3(b)(1), unless the authorization is terminated or revoked.  Performed at [facility], [place] [address], [place], [place] [address]   Blood culture (routine x 2)     Status: None   Collection Time: 12/14/20  4:30 PM   Specimen: BLOOD  Result Value Ref Range Status   Specimen Description BLOOD RIGHT ANTECUBITAL   Final   Special Requests   Final    BOTTLES DRAWN AEROBIC AND ANAEROBIC Blood Culture adequate volume   Culture   Final    NO GROWTH 5 DAYS Performed at [facility], [place] [address] [place], [place] [address]    Report Status 12/19/2020 FINAL  Final  Blood culture (routine x 2)     Status: None   Collection Time: 12/14/20  4:35 PM   Specimen: BLOOD  Result Value Ref Range Status   Specimen Description BLOOD RIGHT ANTECUBITAL  Final   Special Requests   Final    BOTTLES DRAWN AEROBIC AND ANAEROBIC Blood Culture adequate volume   Culture   Final    NO GROWTH 5 DAYS Performed at [facility], [place] [address], [place], [place] [address]    Report Status 12/19/2020 FINAL  Final     Medications   Scheduled Meds:  acidophilus  1 capsule Oral Daily   Chlorhexidine Gluconate Cloth  6 each Topical Daily   digoxin  0.125 mg Intravenous Q6H   feeding supplement  237 mL Oral BID BM   insulin aspart  0-9 Units Subcutaneous Q4H   lipase/protease/amylase  36,000 Units Oral TID AC   loratadine  10 mg Oral Daily   metoprolol succinate  12.5 mg Oral Daily   midodrine  10 mg Oral TID WC   multivitamin with minerals  1 tablet Oral Daily   pantoprazole  40 mg Oral Daily   sodium chloride flush  3 mL Intravenous Q12H   sucralfate  1 g Oral BID WC   Continuous Infusions:  aztreonam 200 mL/hr at 12/19/20 1114   metronidazole 500 mg (12/19/20 0906)    LOS: 5 days    Time spent: 25 minutes  Domenic Polite, MD [facility]  12/19/2020, 11:39 AM

## 2020-12-19 NOTE — Plan of Care (Signed)

## 2020-12-20 DIAGNOSIS — G9340 Encephalopathy, unspecified: Secondary | ICD-10-CM | POA: Diagnosis not present

## 2020-12-20 LAB — GLUCOSE, CAPILLARY
Glucose-Capillary: 144 mg/dL — ABNORMAL HIGH (ref 70–99)
Glucose-Capillary: 148 mg/dL — ABNORMAL HIGH (ref 70–99)
Glucose-Capillary: 150 mg/dL — ABNORMAL HIGH (ref 70–99)
Glucose-Capillary: 161 mg/dL — ABNORMAL HIGH (ref 70–99)
Glucose-Capillary: 164 mg/dL — ABNORMAL HIGH (ref 70–99)
Glucose-Capillary: 165 mg/dL — ABNORMAL HIGH (ref 70–99)
Glucose-Capillary: 207 mg/dL — ABNORMAL HIGH (ref 70–99)

## 2020-12-20 LAB — CBC
HCT: 29.8 % — ABNORMAL LOW (ref 36.0–46.0)
Hemoglobin: 9.8 g/dL — ABNORMAL LOW (ref 12.0–15.0)
MCH: 28.2 pg (ref 26.0–34.0)
MCHC: 32.9 g/dL (ref 30.0–36.0)
MCV: 85.6 fL (ref 80.0–100.0)
Platelets: 53 10*3/uL — ABNORMAL LOW (ref 150–400)
RBC: 3.48 MIL/uL — ABNORMAL LOW (ref 3.87–5.11)
RDW: 17 % — ABNORMAL HIGH (ref 11.5–15.5)
WBC: 10.7 10*3/uL — ABNORMAL HIGH (ref 4.0–10.5)
nRBC: 0.4 % — ABNORMAL HIGH (ref 0.0–0.2)

## 2020-12-20 LAB — BASIC METABOLIC PANEL
Anion gap: 6 (ref 5–15)
BUN: 24 mg/dL — ABNORMAL HIGH (ref 8–23)
CO2: 21 mmol/L — ABNORMAL LOW (ref 22–32)
Calcium: 7.3 mg/dL — ABNORMAL LOW (ref 8.9–10.3)
Chloride: 105 mmol/L (ref 98–111)
Creatinine, Ser: 0.53 mg/dL (ref 0.44–1.00)
GFR, Estimated: >60 mL/min (ref >60)
Glucose, Bld: 176 mg/dL — ABNORMAL HIGH (ref 70–99)
Potassium: 3.6 mmol/L (ref 3.5–5.1)
Sodium: 132 mmol/L — ABNORMAL LOW (ref 135–145)

## 2020-12-20 NOTE — Progress Notes (Addendum)
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  Daughter reported progressive generalized weakness and fatigue since getting chemotherapy on 12/10/2020.  Too weak to even ambulate.  Pt seen at the cancer center and directed to the ED for further evaluation.    In the ED, patient afebrile, in A-fib RVR with HR as high as 130s, and BP in the 88K to 80K systolic.   Head CT negative for acute intracranial abnormality.  Chest x-ray with mild progression in right hemidiaphragm elevation with mild right basilar atelectasis .Labs notable for sodium 126, and elevated BUN to creatinine ratio.  UA noted pyuria, blood cultures were collected in the emergency department, given 2.5 L LR, and started on vancomycin, Azactam, and Flagyl.   Admitted to [facility] service.  Assessment & Plan   Hypotension, lactic acidosis - Transient confusion, metabolic encephalopathy -Also multifactorial, has chronic hypotension, worsened by A. fib, severe hypoalbuminemia and third spacing, dehydration following chemotherapy and cancer progression, suspicion for infection is low -Treated with broad-spectrum antibiotics, blood cultures are negative, influenza and COVID PCR were negative as well, UA noted pyuria, urine culture unfortunately did not get sent -IV fluids discontinued due to profound third spacing, started on midodrine, dose increased -Completed 7 days of aztreonam, discontinue antibiotics today -Has received multiple fluid boluses and IV albumin several times this admission -Random cortisol is 21,  rules out adrenal insufficiency -BP is finally more stable, heart rate improving -PT eval requested  Stage IV pancreatic cancer, liver metastasis- on chemotherapy.  -Status post SBRT and modified FOLFIRINOX. -Had cycle 1 of gemcitabine and Abraxane on 12/10/20. -Oncology following -Overall prognosis appears to be very poor in the setting of her age, frailty, hemodynamic instability, severe hypoalbuminemia with third spacing, progressive pancreatic CA -Ca 19-9 went to 11k from 1.5K 1 month ago, indicating rapid disease progression -I have doubts about her ability to tolerate further chemo -Goals of care discussions ongoing per Dr. Benay Spice -anticipate need for Home Hospice soon, Dr. Benay Spice to reassess on Monday 11/2  A. fib with RVR - - low-dose Toprol if BP tolerates -Continue midodrine as well -Given low-dose digoxin and multiple occasions, heart rate better -No anticoagulation in the setting of severe thrombocytopenia  Hyponatremia -  -Improving with hydration, has a component of chronic hyponatremia too  Hypomagnesemia - replaced   Anemia / Thrombocytopenia - likely due to chemotherapy, cancer -Improving, counts recovering following chemo  Anasarca / Hypoalbuminemia - Albumin <1.5  Given IV albumin multiple times this admission including overnight  -Poor prognosis  Oral ulcers - due to chemo.  On magic mouthwash and topical vaseline  Insulin-dependent DM - insulin coverage and CBG's. Adjust insulin for inpatient goal 140-180  Gastritis - seen on EGD 12/04/2020.   --Continue PPI and Carafate   Severe protein calorie malnutrition  -in the background of very poor oral intake and progressive pancreatic cancer -Prognosis remains very poor  DVT prophylaxis: SCD's due to thrombocytopenia    Code Status: DNR  Family communication: No family at bedside, updated daughter previously Disposition: Anticipate need for home hospice, pending  reassessment by oncologist on  Monday  Subjective 12/20/20    -Feels okay but tired, per staff ate of few bites of applesauce only  Consults, Procedures, Significant Events   Consultants:  Oncology  Procedures:  none  Antimicrobials:  Anti-infectives (From admission, onward)    Start     Dose/Rate Route Frequency Ordered Stop   12/16/20 2000  vancomycin (VANCOREADY) IVPB 1500 mg/300 mL  Status:  Discontinued        1,500 mg 150 mL/hr over 120 Minutes Intravenous Every 24 hours 12/16/20 1040 12/16/20 1432   12/15/20 1700  vancomycin (VANCOREADY) IVPB 1000 mg/200 mL  Status:  Discontinued        1,000 mg 200 mL/hr over 60 Minutes Intravenous Every 24 hours 12/14/20 1616 12/16/20 1040   12/15/20 0800  metroNIDAZOLE (FLAGYL) IVPB 500 mg        500 mg 100 mL/hr over 60 Minutes Intravenous Every 12 hours 12/14/20 2225 12/22/20 0759   12/14/20 2200  aztreonam (AZACTAM) 2 g in sodium chloride 0.9 % 100 mL IVPB  Status:  Discontinued        2 g 200 mL/hr over 30 Minutes Intravenous Every 8 hours 12/14/20 1616 12/20/20 1045   12/14/20 1630  vancomycin (VANCOREADY) IVPB 1000 mg/200 mL        1,000 mg 200 mL/hr over 60 Minutes Intravenous  Once 12/14/20 1548 12/14/20 1814   12/14/20 1600  aztreonam (AZACTAM) 2 g in sodium chloride 0.9 % 100 mL IVPB        2 g 200 mL/hr over 30 Minutes Intravenous  Once 12/14/20 1548 12/14/20 2034   12/14/20 1600  metroNIDAZOLE (FLAGYL) IVPB 500 mg        500 mg 100 mL/hr over 60 Minutes Intravenous  Once 12/14/20 1548 12/14/20 2200       Objective   Vitals:   12/20/20 0019 12/20/20 0402 12/20/20 0447 12/20/20 0839  BP: (!) 116/39  (!) 100/34 (!) 101/59  Pulse: 62 (!) 59 60 88  Resp:   16 11  Temp:  97.7 F (36.5 C) 97.6 F (36.4 C)   TempSrc:  Oral Axillary   SpO2: 96%  97% 95%  Weight:  75.5 kg    Height:        Intake/Output Summary (Last 24 hours) at 12/20/2020 1045 Last data filed at 12/20/2020 9528 Gross per 24 hour  Intake 1596.28 ml  Output --  Net 1596.28  ml    Filed Weights   12/18/20 0500 12/19/20 0500 12/20/20 0402  Weight: (P) 72 kg 76.5 kg 75.5 kg   Chronically ill elderly female laying in bed, awake alert oriented to self and place HEENT: Positive JVD CVS: S1-S2, irregularly irregular rhythm Lungs: Decreased breath sounds to bases Abdomen: Soft, nontender, mildly distended, profound subcutaneous and edema noted in posterior abdomen and flanks Extremities: 2+ edema with third spacing in upper and lower extremities Skin: no new rashes on exposed skin   Labs   Data Reviewed: I have personally reviewed following labs and imaging studies  CBC: Recent Labs  Lab 12/14/20 1340 12/14/20 1635 12/16/20 0256 12/17/20 0333 12/18/20 1518 12/19/20 0254 12/20/20 0303  WBC 9.4   < > 1.8* 1.4*  1.6* 4.3 5.4 10.7*  NEUTROABS 9.0*  --  1.3* 0.6* 2.1 2.7  --   HGB 7.9*   < > 7.8* 8.4*  8.5* 10.0* 10.0* 9.8*  HCT 23.7*   < > 22.5* 24.4*  25.3* 30.4* 29.3* 29.8*  MCV 81.7   < >  82.7 82.4  84.3 84.9 84.4 85.6  PLT 79*   < > 26* 20*  22* 36* 41* 53*   < > = values in this interval not displayed.   Basic Metabolic Panel: Recent Labs  Lab 12/15/20 0235 12/16/20 0256 12/17/20 0333 12/18/20 0227 12/19/20 0254 12/20/20 0303  NA 128* 129* 131* 134* 131* 132*  K 3.7 3.2* 3.5 3.8 3.8 3.6  CL 101 102 106 107 106 105  CO2 19* 20* 20* 20* 19* 21*  GLUCOSE 168* 123* 131* 143* 167* 176*  BUN 26* 21 20 21 22  24*  CREATININE 0.53 0.49 0.44 0.48 0.51 0.53  CALCIUM 7.3* 7.0* 7.1* 7.1* 7.1* 7.3*  MG 1.6* 1.8  --   --   --   --    GFR: Estimated Creatinine Clearance: 54.5 mL/min (by C-G formula based on SCr of 0.53 mg/dL). Liver Function Tests: Recent Labs  Lab 12/14/20 1340 12/15/20 0235 12/16/20 0256 12/17/20 0333 12/19/20 0254  AST 32 42* 55* 55* 29  ALT 26 26 31  38 28  ALKPHOS 213* 169* 186* 245* 270*  BILITOT 2.8* 2.8* 2.9* 2.0* 1.6*  PROT 4.9* 4.1* 3.9* 4.0* 3.8*  ALBUMIN 1.9* 1.8* 1.6* 1.7* <1.5*   No results for  input(s): LIPASE, AMYLASE in the last 168 hours. Recent Labs  Lab 12/14/20 1340 12/14/20 1635  AMMONIA 84* 32   Coagulation Profile: Recent Labs  Lab 12/14/20 1635  INR 1.5*   Cardiac Enzymes: No results for input(s): CKTOTAL, CKMB, CKMBINDEX, TROPONINI in the last 168 hours. BNP (last 3 results) No results for input(s): PROBNP in the last 8760 hours. HbA1C: No results for input(s): HGBA1C in the last 72 hours. CBG: Recent Labs  Lab 12/19/20 2058 12/19/20 2324 12/20/20 0359 12/20/20 0513 12/20/20 0757  GLUCAP 228* 190* 150* 165* 144*   Lipid Profile: No results for input(s): CHOL, HDL, LDLCALC, TRIG, CHOLHDL, LDLDIRECT in the last 72 hours. Thyroid Function Tests: No results for input(s): TSH, T4TOTAL, FREET4, T3FREE, THYROIDAB in the last 72 hours.  Anemia Panel: No results for input(s): VITAMINB12, FOLATE, FERRITIN, TIBC, IRON, RETICCTPCT in the last 72 hours. Sepsis Labs: Recent Labs  Lab 12/14/20 1635 12/14/20 2000 12/15/20 0235 12/16/20 0256  LATICACIDVEN 2.6* 2.7* 2.1* 2.0*    Recent Results (from the past 240 hour(s))  Resp Panel by RT-PCR (Flu A&B, Covid) Nasopharyngeal Swab     Status: None   Collection Time: 12/14/20  3:44 PM   Specimen: Nasopharyngeal Swab; Nasopharyngeal(NP) swabs in vial transport medium  Result Value Ref Range Status   SARS Coronavirus 2 by RT PCR NEGATIVE NEGATIVE Final    Comment: (NOTE) SARS-CoV-2 target nucleic acids are NOT DETECTED.  The SARS-CoV-2 RNA is generally detectable in upper respiratory specimens during the acute phase of infection. The lowest concentration of SARS-CoV-2 viral copies this assay can detect is 138 copies/mL. A negative result does not preclude SARS-Cov-2 infection and should not be used as the sole basis for treatment or other patient management decisions. A negative result may occur with  improper specimen collection/handling, submission of specimen other than nasopharyngeal swab, presence of  viral mutation(s) within the areas targeted by this assay, and inadequate number of viral copies(<138 copies/mL). A negative result must be combined with clinical observations, patient history, and epidemiological information. The expected result is Negative.  Fact Sheet for Patients:  EntrepreneurPulse.com.au  Fact Sheet for Healthcare Providers:  IncredibleEmployment.be  This test is no t yet approved or cleared by the [place] FDA  and  has been authorized for detection and/or diagnosis of SARS-CoV-2 by FDA under an Emergency Use Authorization (EUA). This EUA will remain  in effect (meaning this test can be used) for the duration of the COVID-19 declaration under Section 564(b)(1) of the Act, 21 U.S.C.section 360bbb-3(b)(1), unless the authorization is terminated  or revoked sooner.       Influenza A by PCR NEGATIVE NEGATIVE Final   Influenza B by PCR NEGATIVE NEGATIVE Final    Comment: (NOTE) The Xpert Xpress SARS-CoV-2/FLU/RSV plus assay is intended as an aid in the diagnosis of influenza from Nasopharyngeal swab specimens and should not be used as a sole basis for treatment. Nasal washings and aspirates are unacceptable for Xpert Xpress SARS-CoV-2/FLU/RSV testing.  Fact Sheet for Patients: EntrepreneurPulse.com.au  Fact Sheet for Healthcare Providers: IncredibleEmployment.be  This test is not yet approved or cleared by the [place] FDA and has been authorized for detection and/or diagnosis of SARS-CoV-2 by FDA under an Emergency Use Authorization (EUA). This EUA will remain in effect (meaning this test can be used) for the duration of the COVID-19 declaration under Section 564(b)(1) of the Act, 21 U.S.C. section 360bbb-3(b)(1), unless the authorization is terminated or revoked.  Performed at [facility], [place] [address] [place], [place] [address]   Blood culture (routine x 2)      Status: None   Collection Time: 12/14/20  4:30 PM   Specimen: BLOOD  Result Value Ref Range Status   Specimen Description BLOOD RIGHT ANTECUBITAL  Final   Special Requests   Final    BOTTLES DRAWN AEROBIC AND ANAEROBIC Blood Culture adequate volume   Culture   Final    NO GROWTH 5 DAYS Performed at [facility], [place] [address] [place], [place] [address]    Report Status 12/19/2020 FINAL  Final  Blood culture (routine x 2)     Status: None   Collection Time: 12/14/20  4:35 PM   Specimen: BLOOD  Result Value Ref Range Status   Specimen Description BLOOD RIGHT ANTECUBITAL  Final   Special Requests   Final    BOTTLES DRAWN AEROBIC AND ANAEROBIC Blood Culture adequate volume   Culture   Final    NO GROWTH 5 DAYS Performed at [facility], [place] [address], [place], [place] [address]    Report Status 12/19/2020 FINAL  Final     Medications   Scheduled Meds:  acidophilus  1 capsule Oral Daily   Chlorhexidine Gluconate Cloth  6 each Topical Daily   feeding supplement  237 mL Oral BID BM   insulin aspart  0-9 Units Subcutaneous Q4H   lipase/protease/amylase  36,000 Units Oral TID AC   loratadine  10 mg Oral Daily   metoprolol succinate  12.5 mg Oral Daily   midodrine  10 mg Oral TID WC   multivitamin with minerals  1 tablet Oral Daily   pantoprazole  40 mg Oral Daily   sodium chloride flush  3 mL Intravenous Q12H   sucralfate  1 g Oral BID WC   Continuous Infusions:  metronidazole 500 mg (12/20/20 0852)    LOS: 6 days    Time spent: 25 minutes  Domenic Polite, MD [facility]  12/20/2020, 10:45 AM

## 2020-12-21 DIAGNOSIS — G9340 Encephalopathy, unspecified: Secondary | ICD-10-CM | POA: Diagnosis not present

## 2020-12-21 LAB — CBC
HCT: 31.4 % — ABNORMAL LOW (ref 36.0–46.0)
Hemoglobin: 10.2 g/dL — ABNORMAL LOW (ref 12.0–15.0)
MCH: 28.2 pg (ref 26.0–34.0)
MCHC: 32.5 g/dL (ref 30.0–36.0)
MCV: 86.7 fL (ref 80.0–100.0)
Platelets: 60 10*3/uL — ABNORMAL LOW (ref 150–400)
RBC: 3.62 MIL/uL — ABNORMAL LOW (ref 3.87–5.11)
RDW: 17.9 % — ABNORMAL HIGH (ref 11.5–15.5)
WBC: 11.5 10*3/uL — ABNORMAL HIGH (ref 4.0–10.5)
nRBC: 0.2 % (ref 0.0–0.2)

## 2020-12-21 LAB — GLUCOSE, CAPILLARY
Glucose-Capillary: 151 mg/dL — ABNORMAL HIGH (ref 70–99)
Glucose-Capillary: 131 mg/dL — ABNORMAL HIGH (ref 70–99)
Glucose-Capillary: 153 mg/dL — ABNORMAL HIGH (ref 70–99)
Glucose-Capillary: 147 mg/dL — ABNORMAL HIGH (ref 70–99)
Glucose-Capillary: 218 mg/dL — ABNORMAL HIGH (ref 70–99)

## 2020-12-21 LAB — BASIC METABOLIC PANEL
Anion gap: 5 (ref 5–15)
BUN: 20 mg/dL (ref 8–23)
CO2: 21 mmol/L — ABNORMAL LOW (ref 22–32)
Calcium: 7.3 mg/dL — ABNORMAL LOW (ref 8.9–10.3)
Chloride: 104 mmol/L (ref 98–111)
Creatinine, Ser: 0.49 mg/dL (ref 0.44–1.00)
GFR, Estimated: >60 mL/min (ref >60)
Glucose, Bld: 145 mg/dL — ABNORMAL HIGH (ref 70–99)
Potassium: 3.8 mmol/L (ref 3.5–5.1)
Sodium: 130 mmol/L — ABNORMAL LOW (ref 135–145)

## 2020-12-21 NOTE — Plan of Care (Signed)
  Problem: Activity: Goal: Risk for activity intolerance will decrease Outcome: Progressing   Problem: Coping: Goal: Level of anxiety will decrease Outcome: Progressing   Problem: Elimination: Goal: Will not experience complications related to bowel motility Outcome: Progressing   Problem: Pain Managment: Goal: General experience of comfort will improve Outcome: Progressing   

## 2020-12-21 NOTE — Progress Notes (Signed)
IP PROGRESS NOTE  Subjective:   Nancy Hawkins has no new complaint.  She is not ambulating.  Objective: Vital signs in last 24 hours: Blood pressure (!) 126/51, pulse 61, temperature (!) 97.5 F (36.4 C), temperature source Axillary, resp. rate 13, height 5\' 3"  (1.6 m), weight 166 lb 7.2 oz (75.5 kg), SpO2 95 %.  Intake/Output from previous day: 11/13 0701 - 11/14 0700 In: 720 [P.O.:720] Out: -   Physical Exam:  HEENT: No bleeding, the mouth is dry Lungs: Clear anteriorly Cardiac: Irregular Abdomen: Distended with ascites, nontender Extremities: Pitting edema in the arms and legs Neurologic: Alert and oriented, follows commands  Portacath/PICC-without erythema  Lab Results: Recent Labs    12/20/20 0303 12/21/20 0358  WBC 10.7* 11.5*  HGB 9.8* 10.2*  HCT 29.8* 31.4*  PLT 53* 60*    BMET Recent Labs    12/20/20 0303 12/21/20 0358  NA 132* 130*  K 3.6 3.8  CL 105 104  CO2 21* 21*  GLUCOSE 176* 145*  BUN 24* 20  CREATININE 0.53 0.49  CALCIUM 7.3* 7.3*    Lab Results  Component Value Date   [id number] 11,154 (H) 12/01/2020    Medications: I have reviewed the patient's current medications.  Assessment/Plan:  Pancreas cancer, borderline resectable CT abdomen/pelvis-inflammation involving the mid body of the pancreas with peripancreatic fat stranding; possible 2.5 x 2.2 cm low-attenuation mass within the mid body of the pancreas, appears to abut the celiac axis as well as the splenic vein.   MRI 11/18/2019-extensive inflammatory fat stranding about the pancreas with a thinly septated multicystic fluid signal lesion of the anterior pancreatic neck measuring 3.2 x 1.7 cm most consistent with a pancreatic pseudocyst; numerous additional small fluid signal lesions throughout the pancreatic parenchyma, the remaining lesions subcentimeter; central portion of pancreatic duct dilated measuring up to 8 mm without obvious obstructing lesion identified; trace ascites; trace  bilateral pleural effusions and associated atelectasis or consolidation.   Colonoscopy 11/20/2019-5 polyps (resected and retrieved- multiple tubular adenomas, negative for high-grade dysplasia); 2 colonic angioectasias; diverticulosis in the entire examined colon; nonbleeding nonthrombosed external and internal hemorrhoids.   EGD/EUS 11/20/2019-few white nummular lesions in the entire esophagus (esophagus biopsy with no significant pathologic findings, no fungal elements); hematin found in the entire examined stomach; gastritis (stomach biopsy showed gastropathy/gastritis); 14 mm semisessile polyp found in the posterior wall of the gastric antrum (hyperplastic polyp); a lesion was identified in the pancreatic body staged T2 N0 MX.  FNA of the pancreas lesion showed benign reactive/reparative changes, prominent inflammation.   EUS procedure 01/20/2020-masslike region identified in the pancreatic head, stage T2 N0 MX.  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 CTs chest/pelvis 01/29/2020-no evidence of metastatic disease CT abdomen/pelvis 03/05/2020-increase in size of pancreas head/uncinate mass, marked narrowing of the adjacent portal vein, patchy consolidation in the right lower lung, mild intrahepatic and extrahepatic biliary ductal dilatation Cycle 1 FOLFOX 03/23/2020 Cycle 2 FOLFOX 04/06/2020, Emend added for delayed nausea after cycle 1 Cycle 3 FOLFIRINOX 04/20/2020 Cycle 4 FOLFIRINOX 05/04/2020, Udenyca Cycle 5 FOLFIRINOX 05/18/2020, Udenyca Cycle 6 FOLFIRINOX 06/02/2020, Udenyca CTs 06/15/2020- decreased size of pancreatic mass, still with portal vein involvement and extensive upper abdominal collaterals. 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 Cycle 1 gemcitabine/Abraxane 12/10/2020 Pancreatitis October 2021 Diabetes October 2021 Isolated  episode of atrial fibrillation approximately 15 years ago, recurrent atrial fibrillation 12/14/2020 Hypertension COVID-19 infection Admission 03/05/2020 with biliary obstruction-ERCP 03/09/2020, biliary obstruction secondary to known pancreatic head mass found in the lower third of the main duct.  The upper third of the main bile duct and left and right hepatic ducts and all intrahepatic branches moderately dilated secondary to the stricture.  Biliary sphincterotomy performed.  Covered metal biliary stent placed into the common bile duct. 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  She is now at day 12 following cycle 1 gemcitabine/Abraxane.  The white count has recovered and the platelets are improved.  Her mental status has returned to baseline.  Hypotension has improved.  Nancy Hawkins continues to have a poor performance status.  I discussed disposition plans with her.  She does not appear to be a candidate for further chemotherapy at present.  She agrees to home hospice care.  Recommendations: Referral to [facility] hospice for home hospice care Management of atrial fibrillation and hypotension per the medical service Consider resuming Lovenox if the platelet count continues to improve Outpatient follow-up will be scheduled at the Cancer center     LOS: 7 days   [name], MD   12/21/2020, 11:47 AM

## 2020-12-21 NOTE — Progress Notes (Signed)
Physical Therapy Treatment Patient Details Name: Nancy Hawkins MRN: [id number] DOB: 1940/12/08 Today's Date: 12/21/2020   History of Present Illness 80 yo female presents to ED with weakness, slurred speech, AMS. Pt with pancreatic cancer, undergoing chemo. CT head negative for acute intracranial findings. Chest x-ray with mild progression in right hemidiaphragm elevation with mild right basilar atelectasis and question of small right pleural effusion. Workup for AME. Other PMH includes PMH significant for type II DM, atrial fibrillation on anticoagulation, bilat LE DVTs, HLD, HTN.    PT Comments    Continuing work on functional mobility and activity tolerance;  Session focused on taking a close look at activity tolerance with transfers/OOB activity; Pt increasingly fatigued during session, required mod A +2 for bed mobility, max-total A +2 for UB/LB ADLs and transfers. Pt's BP drops significantly when sitting unsupported (87/42) compared to supported in recliner (126/51) during session, pt denies pain. Pt repositioned with BUE and BLE supported;   Pt and spouse still considering d/c preferences, discussed benefits of HH vs. home with assistance, depending on pt goals and level of function. Discuss need for lift equipment at home, and ambulance transport home; [facility] has consulted for home hospice services -- can defer decision re: [facility] PT or not to them  Recommendations for follow up therapy are one component of a multi-disciplinary discharge planning process, led by the attending physician.  Recommendations may be updated based on patient status, additional functional criteria and insurance authorization.  Follow Up Recommendations  Home health PT (vs home with hospice, pending family and MD decision)     Assistance Recommended at Discharge Frequent or constant Supervision/Assistance  Equipment Recommendations  Rolling Kehres (2 wheels);BSC/3in1;Wheelchair (measurements PT);Wheelchair  cushion (measurements PT);Other (comment) (hoyer lift, ambualnce transport home)    Recommendations for Other Services       Precautions / Restrictions Precautions Precautions: Fall     Mobility  Bed Mobility Overal bed mobility: Needs Assistance Bed Mobility: Supine to Sit     Supine to sit: Mod assist;+2 for physical assistance;HOB elevated Sit to supine: Mod assist;+2 for physical assistance;HOB elevated   General bed mobility comments: Overall heavy moderate assist of 2, and use of bed pad to assist pt up to EOB    Transfers Overall transfer level: Needs assistance Equipment used: 2 person hand held assist Transfers: Sit to/from Stand;Bed to chair/wheelchair/BSC Sit to Stand: Total assist;+2 physical assistance;Max assist;From elevated surface   Squat pivot transfers: Max assist;Total assist;+2 physical assistance;From elevated surface       General transfer comment: Heavy transfer to stand, and tehn to chair on pt's L with 2 person Max/Total Assist; bil knees blocked for stability    Ambulation/Gait                   Stairs             Wheelchair Mobility    Modified Rankin (Stroke Patients Only)       Balance     Sitting balance-Leahy Scale: Fair       Standing balance-Leahy Scale: Zero                              Cognition Arousal/Alertness: Awake/alert Behavior During Therapy: WFL for tasks assessed/performed Overall Cognitive Status: Within Functional Limits for tasks assessed  General Comments: Pt overall fatigued during session        Exercises      General Comments General comments (skin integrity, edema, etc.):   12/21/20 1038 12/21/20 1047  Orthostatic Sitting  BP- Sitting (!) 87/42 126/51 (feet up in recliner, post transfer OOB to chair)  Pulse- Sitting 65 61        Pertinent Vitals/Pain Pain Assessment: No/denies pain    Home Living                           Prior Function            PT Goals (current goals can now be found in the care plan section) Acute Rehab PT Goals Patient Stated Goal: home PT Goal Formulation: With patient/family Time For Goal Achievement: 12/25/2020 Potential to Achieve Goals: Good Progress towards PT goals: Progressing toward goals    Frequency    Min 3X/week      PT Plan Current plan remains appropriate    Co-evaluation PT/OT/SLP Co-Evaluation/Treatment: Yes Reason for Co-Treatment: For patient/therapist safety;To address functional/ADL transfers PT goals addressed during session: Mobility/safety with mobility        AM-PAC PT "6 Clicks" Mobility   Outcome Measure  Help needed turning from your back to your side while in a flat bed without using bedrails?: A Lot Help needed moving from lying on your back to sitting on the side of a flat bed without using bedrails?: A Lot Help needed moving to and from a bed to a chair (including a wheelchair)?: Total Help needed standing up from a chair using your arms (e.g., wheelchair or bedside chair)?: Total Help needed to walk in hospital room?: Total Help needed climbing 3-5 steps with a railing? : Total 6 Click Score: 8    End of Session Equipment Utilized During Treatment: Gait belt Activity Tolerance: Patient limited by fatigue Patient left: in chair;with call bell/phone within reach;with chair alarm set Nurse Communication: Mobility status;Need for lift equipment;Other (comment) (and vitals, including BP drop with activity) PT Visit Diagnosis: Other abnormalities of gait and mobility (R26.89);Muscle weakness (generalized) (M62.81)     Time: 1026-1100 PT Time Calculation (min) (ACUTE ONLY): 34 min  Charges:  $Therapeutic Activity: 8-22 mins                     Roney Marion, PT  Acute Rehabilitation Services Pager [phone] Office [place] 12/21/2020, 4:19 PM

## 2020-12-21 NOTE — Progress Notes (Signed)
Occupational Therapy Treatment Patient Details Name: Nancy Hawkins MRN: [id number] DOB: December 08, 1940 Today's Date: 12/21/2020   History of present illness 80 yo female presents to ED with weakness, slurred speech, AMS. Pt with pancreatic cancer, undergoing chemo. CT head negative for acute intracranial findings. Chest x-ray with mild progression in right hemidiaphragm elevation with mild right basilar atelectasis and question of small right pleural effusion. Workup for AME. Other PMH includes PMH significant for type II DM, atrial fibrillation on anticoagulation, bilat LE DVTs, HLD, HTN.   OT comments  Pt increasingly fatigued during session, required mod A +2 for bed mobility, max-total A +2 for UB/LB ADLs and transfers. Pt's BP drops significantly when sitting unsupported (87/42) compared to supported in recliner (126/51) during session, pt denies pain. Pt repositioned with BUE and BLE supported, Pt and spouse still considering d/c preferences, discussed benefits of HH vs. home with assistance, depending on pt goals and level of function. Discuss need for lift equipment at home. Pt limited by impaired activity tolerance, balance, strength, and edema, will continue to follow acutely. Pt to benefit from d/c home with [facility].   Recommendations for follow up therapy are one component of a multi-disciplinary discharge planning process, led by the attending physician.  Recommendations may be updated based on patient status, additional functional criteria and insurance authorization.    Follow Up Recommendations  Home health OT    Assistance Recommended at Discharge Frequent or constant Supervision/Assistance  Equipment Recommendations  BSC/3in1;Tub/shower seat;Other (comment) (hoyer lift, dependent upon pt preferences for d/c home, currently pt does not have any DME at home per spouse report)    Recommendations for Other Services PT consult    Precautions / Restrictions Precautions Precautions:  Fall Restrictions Weight Bearing Restrictions: No       Mobility Bed Mobility Overal bed mobility: Needs Assistance Bed Mobility: Supine to Sit     Supine to sit: Mod assist;+2 for physical assistance;HOB elevated     General bed mobility comments: pt BP taken sitting unsupported EOB (87/42), improved to (126/51) after transfer and reclined in chair with feet elevated    Transfers Overall transfer level: Needs assistance   Transfers: Sit to/from Stand;Bed to chair/wheelchair/BSC Sit to Stand: Total assist;+2 physical assistance;Max assist;From elevated surface     Squat pivot transfers: Max assist;Total assist;+2 physical assistance;From elevated surface     General transfer comment: recommend use of maxi move for chair/BSC transfer     Balance Overall balance assessment: Needs assistance Sitting-balance support: Bilateral upper extremity supported;Feet supported Sitting balance-Leahy Scale: Fair     Standing balance support: Bilateral upper extremity supported;During functional activity Standing balance-Leahy Scale: Zero Standing balance comment: put unable to clear hips, requires knee block and support at hips to attempt standing                           ADL either performed or assessed with clinical judgement   ADL Overall ADL's : Needs assistance/impaired                 Upper Body Dressing : Maximal assistance;Sitting Upper Body Dressing Details (indicate cue type and reason): required assistance to lift BUE's through sleeves of gown Lower Body Dressing: Total assistance;Bed level;Maximal assistance Lower Body Dressing Details (indicate cue type and reason): able to lift toes upward when donning socks supine in bed Toilet Transfer: Total assistance;+2 for physical assistance;Cueing for safety Toilet Transfer Details (indicate cue type and reason): simulated toilet  transfer to recliner,                Extremity/Trunk Assessment Upper  Extremity Assessment Upper Extremity Assessment: Generalized weakness RUE Deficits / Details: weeping edema RUE Coordination: decreased fine motor;decreased gross motor LUE Deficits / Details: weeping edema LUE Coordination: decreased gross motor;decreased fine motor   Lower Extremity Assessment Lower Extremity Assessment: Defer to PT evaluation        Vision   Vision Assessment?: No apparent visual deficits   Perception Perception Perception: Within Functional Limits   Praxis Praxis Praxis: Not tested    Cognition Arousal/Alertness: Awake/alert Behavior During Therapy: WFL for tasks assessed/performed Overall Cognitive Status: Within Functional Limits for tasks assessed                                 General Comments: Pt overall fatigued during session          Exercises     Shoulder Instructions       General Comments      Pertinent Vitals/ Pain       Pain Assessment: No/denies pain  Home Living                                          Prior Functioning/Environment              Frequency  Min 2X/week        Progress Toward Goals  OT Goals(current goals can now be found in the care plan section)     Acute Rehab OT Goals Patient Stated Goal: return home OT Goal Formulation: With patient/family Time For Goal Achievement: 01/05/2021 Potential to Achieve Goals: Fair ADL Goals Pt Will Perform Grooming: with min guard assist;sitting Pt Will Perform Upper Body Dressing: with min guard assist;sitting;with set-up Pt Will Perform Lower Body Dressing: with min assist;sit to/from stand Pt Will Transfer to Toilet: with min assist;ambulating;stand pivot transfer;bedside commode Pt Will Perform Toileting - Clothing Manipulation and hygiene: with min assist;sitting/lateral leans;sit to/from stand Additional ADL Goal #1: Pt will complete bed mobility at min guard level to prepare for EOB/OOB ADLs.  Plan Discharge plan remains  appropriate;Frequency remains appropriate    Co-evaluation    PT/OT/SLP Co-Evaluation/Treatment: Yes Reason for Co-Treatment: For patient/therapist safety;To address functional/ADL transfers   OT goals addressed during session: ADL's and self-care      AM-PAC OT "6 Clicks" Daily Activity     Outcome Measure   Help from another person eating meals?: A Little Help from another person taking care of personal grooming?: A Lot Help from another person toileting, which includes using toliet, bedpan, or urinal?: Total Help from another person bathing (including washing, rinsing, drying)?: Total Help from another person to put on and taking off regular upper body clothing?: A Lot Help from another person to put on and taking off regular lower body clothing?: Total 6 Click Score: 10    End of Session Equipment Utilized During Treatment: Gait belt  OT Visit Diagnosis: Unsteadiness on feet (R26.81);Other abnormalities of gait and mobility (R26.89);Muscle weakness (generalized) (M62.81);Pain   Activity Tolerance Patient limited by fatigue   Patient Left in chair;with call bell/phone within reach;with nursing/sitter in room   Nurse Communication Mobility status        Time: 1026-1100 OT Time Calculation (min): 34 min  Charges: OT General  Charges $OT Visit: 1 Visit OT Treatments $Self Care/Home Management : 8-22 mins  Lynnda Child, OTD, OTR/L Acute Rehab [phone] - [place] 12/21/2020, 12:17 PM

## 2020-12-21 NOTE — Progress Notes (Signed)
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  Daughter reported progressive generalized weakness and fatigue since getting chemotherapy on 12/10/2020.  Too weak to even ambulate.  Pt seen at the cancer center and directed to the ED for further evaluation.    In the ED, patient afebrile, in A-fib RVR with HR as high as 130s, and BP in the 82X to 93Z systolic.   Head CT negative for acute intracranial abnormality.  Chest x-ray with mild progression in right hemidiaphragm elevation with mild right basilar atelectasis .Labs notable for sodium 126, and elevated BUN to creatinine ratio.  UA noted pyuria, blood cultures were collected in the emergency department, given 2.5 L LR, and started on vancomycin, Azactam, and Flagyl.   Admitted to [facility].  Assessment & Plan   Hypotension, lactic acidosis - Transient confusion, metabolic encephalopathy -Also multifactorial, has chronic hypotension, worsened by A. fib, severe hypoalbuminemia and third spacing, dehydration following chemotherapy and cancer progression, suspicion for infection is low -Treated with broad-spectrum antibiotics, blood cultures are negative, influenza and COVID PCR were negative as well, UA noted pyuria, urine culture unfortunately did not get sent -IV fluids discontinued due to profound third spacing, started on midodrine, dose increased -Completed 7 days of aztreonam, discontinue antibiotics today -Has received multiple fluid boluses and IV albumin several times this admission -Random cortisol is 21,  rules out adrenal insufficiency -BP is finally more stable, heart rate improving -PT eval requested, referral sent to home hospice by oncology today  Stage IV pancreatic cancer, liver metastasis- on chemotherapy.  -Status post SBRT and modified FOLFIRINOX. -Had cycle 1 of gemcitabine and Abraxane on 12/10/20. -Oncology following -Overall prognosis appears to be very poor in the setting of her age, frailty, hemodynamic instability, severe hypoalbuminemia with third spacing, progressive pancreatic CA -Ca 19-9 went to 11k from 1.5K 1 month ago, indicating rapid disease progression -I have doubts about her ability to tolerate further chemo -Goals of care discussions ongoing per Dr. Benay Spice -Dr. Benay Spice made referral to home hospice today  A. fib with RVR - - low-dose Toprol if BP tolerates -Continue midodrine as well -Given low-dose digoxin and multiple occasions, heart rate better -No anticoagulation in the setting of severe thrombocytopenia  Hyponatremia -  -Improving with hydration, has a component of chronic hyponatremia too  Hypomagnesemia - replaced   Anemia / Thrombocytopenia - likely due to chemotherapy, cancer -Improving, counts recovering following chemo  Anasarca / Hypoalbuminemia - Albumin <1.5  Given IV albumin multiple times this admission including overnight  -Poor prognosis  Oral ulcers - due to chemo.  On magic mouthwash and topical vaseline  Insulin-dependent DM - insulin coverage and CBG's. Adjust insulin for inpatient goal 140-180  Gastritis - seen on EGD 12/04/2020.   --Continue PPI and Carafate   Severe protein calorie malnutrition  -in the background of very poor oral intake and progressive pancreatic cancer -Prognosis remains very poor  DVT prophylaxis: SCD's due to thrombocytopenia    Code Status: DNR  Family communication: No family at bedside,  Disposition: Home with hospice services  Subjective 12/21/20    -Tired, ate a few bites for  breakfast only, has not been able to get out of bed yet  Consults, Procedures, Significant Events   Consultants:  Oncology  Procedures:  none  Antimicrobials:  Anti-infectives (From admission, onward)    Start     Dose/Rate Route Frequency Ordered Stop   12/16/20 2000  vancomycin (VANCOREADY) IVPB 1500 mg/300 mL  Status:  Discontinued        1,500 mg 150 mL/hr over 120 Minutes Intravenous Every 24 hours 12/16/20 1040 12/16/20 1432   12/15/20 1700  vancomycin (VANCOREADY) IVPB 1000 mg/200 mL  Status:  Discontinued        1,000 mg 200 mL/hr over 60 Minutes Intravenous Every 24 hours 12/14/20 1616 12/16/20 1040   12/15/20 0800  metroNIDAZOLE (FLAGYL) IVPB 500 mg  Status:  Discontinued        500 mg 100 mL/hr over 60 Minutes Intravenous Every 12 hours 12/14/20 2225 12/20/20 1117   12/14/20 2200  aztreonam (AZACTAM) 2 g in sodium chloride 0.9 % 100 mL IVPB  Status:  Discontinued        2 g 200 mL/hr over 30 Minutes Intravenous Every 8 hours 12/14/20 1616 12/20/20 1045   12/14/20 1630  vancomycin (VANCOREADY) IVPB 1000 mg/200 mL        1,000 mg 200 mL/hr over 60 Minutes Intravenous  Once 12/14/20 1548 12/14/20 1814   12/14/20 1600  aztreonam (AZACTAM) 2 g in sodium chloride 0.9 % 100 mL IVPB        2 g 200 mL/hr over 30 Minutes Intravenous  Once 12/14/20 1548 12/14/20 2034   12/14/20 1600  metroNIDAZOLE (FLAGYL) IVPB 500 mg        500 mg 100 mL/hr over 60 Minutes Intravenous  Once 12/14/20 1548 12/14/20 2200       Objective   Vitals:   12/21/20 0800 12/21/20 1038 12/21/20 1047 12/21/20 1200  BP:  (!) 87/42 (!) 126/51 (!) 117/58  Pulse: 63 65 61 61  Resp: 13     Temp:    (!) 97.5 F (36.4 C)  TempSrc:      SpO2: 97% 98% 95% 100%  Weight:      Height:        Intake/Output Summary (Last 24 hours) at 12/21/2020 1245 Last data filed at 12/20/2020 1900 Gross per 24 hour  Intake 240 ml  Output --  Net 240 ml    Filed Weights   12/18/20 0500 12/19/20 0500 12/20/20 0402   Weight: (P) 72 kg 76.5 kg 75.5 kg   Chronically ill elderly female laying in bed, awake alert oriented to self and place HEENT: Positive JVD CVS: S1-S2, regular rate rhythm Lungs: Decreased breath sounds to bases Abdomen: Soft, nontender, mildly distended, profound subcutaneous edema in posterior abdomen and flanks  Extremities: 2+ edema with third spacing in upper and lower extremities Skin: no new rashes on exposed skin   Labs   Data Reviewed: I have personally reviewed following labs and imaging studies  CBC: Recent Labs  Lab 12/14/20 1340 12/14/20 1635 12/16/20 0256 12/17/20 0333 12/18/20 1518 12/19/20 0254 12/20/20 0303 12/21/20 0358  WBC 9.4   < > 1.8* 1.4*  1.6* 4.3 5.4 10.7* 11.5*  NEUTROABS 9.0*  --  1.3* 0.6* 2.1 2.7  --   --   HGB 7.9*   < > 7.8* 8.4*  8.5* 10.0* 10.0* 9.8* 10.2*  HCT 23.7*   < > 22.5* 24.4*  25.3* 30.4*  29.3* 29.8* 31.4*  MCV 81.7   < > 82.7 82.4  84.3 84.9 84.4 85.6 86.7  PLT 79*   < > 26* 20*  22* 36* 41* 53* 60*   < > = values in this interval not displayed.   Basic Metabolic Panel: Recent Labs  Lab 12/15/20 0235 12/16/20 0256 12/17/20 0333 12/18/20 0227 12/19/20 0254 12/20/20 0303 12/21/20 0358  NA 128* 129* 131* 134* 131* 132* 130*  K 3.7 3.2* 3.5 3.8 3.8 3.6 3.8  CL 101 102 106 107 106 105 104  CO2 19* 20* 20* 20* 19* 21* 21*  GLUCOSE 168* 123* 131* 143* 167* 176* 145*  BUN 26* 21 20 21 22  24* 20  CREATININE 0.53 0.49 0.44 0.48 0.51 0.53 0.49  CALCIUM 7.3* 7.0* 7.1* 7.1* 7.1* 7.3* 7.3*  MG 1.6* 1.8  --   --   --   --   --    GFR: Estimated Creatinine Clearance: 54.5 mL/min (by C-G formula based on SCr of 0.49 mg/dL). Liver Function Tests: Recent Labs  Lab 12/14/20 1340 12/15/20 0235 12/16/20 0256 12/17/20 0333 12/19/20 0254  AST 32 42* 55* 55* 29  ALT 26 26 31  38 28  ALKPHOS 213* 169* 186* 245* 270*  BILITOT 2.8* 2.8* 2.9* 2.0* 1.6*  PROT 4.9* 4.1* 3.9* 4.0* 3.8*  ALBUMIN 1.9* 1.8* 1.6* 1.7* <1.5*   No  results for input(s): LIPASE, AMYLASE in the last 168 hours. Recent Labs  Lab 12/14/20 1340 12/14/20 1635  AMMONIA 84* 32   Coagulation Profile: Recent Labs  Lab 12/14/20 1635  INR 1.5*   Cardiac Enzymes: No results for input(s): CKTOTAL, CKMB, CKMBINDEX, TROPONINI in the last 168 hours. BNP (last 3 results) No results for input(s): PROBNP in the last 8760 hours. HbA1C: No results for input(s): HGBA1C in the last 72 hours. CBG: Recent Labs  Lab 12/20/20 2147 12/20/20 2347 12/21/20 0351 12/21/20 0811 12/21/20 1143  GLUCAP 161* 148* 151* 131* 153*   Lipid Profile: No results for input(s): CHOL, HDL, LDLCALC, TRIG, CHOLHDL, LDLDIRECT in the last 72 hours. Thyroid Function Tests: No results for input(s): TSH, T4TOTAL, FREET4, T3FREE, THYROIDAB in the last 72 hours.  Anemia Panel: No results for input(s): VITAMINB12, FOLATE, FERRITIN, TIBC, IRON, RETICCTPCT in the last 72 hours. Sepsis Labs: Recent Labs  Lab 12/14/20 1635 12/14/20 2000 12/15/20 0235 12/16/20 0256  LATICACIDVEN 2.6* 2.7* 2.1* 2.0*    Recent Results (from the past 240 hour(s))  Resp Panel by RT-PCR (Flu A&B, Covid) Nasopharyngeal Swab     Status: None   Collection Time: 12/14/20  3:44 PM   Specimen: Nasopharyngeal Swab; Nasopharyngeal(NP) swabs in vial transport medium  Result Value Ref Range Status   SARS Coronavirus 2 by RT PCR NEGATIVE NEGATIVE Final    Comment: (NOTE) SARS-CoV-2 target nucleic acids are NOT DETECTED.  The SARS-CoV-2 RNA is generally detectable in upper respiratory specimens during the acute phase of infection. The lowest concentration of SARS-CoV-2 viral copies this assay can detect is 138 copies/mL. A negative result does not preclude SARS-Cov-2 infection and should not be used as the sole basis for treatment or other patient management decisions. A negative result may occur with  improper specimen collection/handling, submission of specimen other than nasopharyngeal swab,  presence of viral mutation(s) within the areas targeted by this assay, and inadequate number of viral copies(<138 copies/mL). A negative result must be combined with clinical observations, patient history, and epidemiological information. The expected result is Negative.  Fact Sheet for Patients:  EntrepreneurPulse.com.au  Fact Sheet for Healthcare Providers:  IncredibleEmployment.be  This test is no t yet approved or cleared by the [place] FDA and  has been authorized for detection and/or diagnosis of SARS-CoV-2 by FDA under an Emergency Use Authorization (EUA). This EUA will remain  in effect (meaning this test can be used) for the duration of the COVID-19 declaration under Section 564(b)(1) of the Act, 21 U.S.C.section 360bbb-3(b)(1), unless the authorization is terminated  or revoked sooner.       Influenza A by PCR NEGATIVE NEGATIVE Final   Influenza B by PCR NEGATIVE NEGATIVE Final    Comment: (NOTE) The Xpert Xpress SARS-CoV-2/FLU/RSV plus assay is intended as an aid in the diagnosis of influenza from Nasopharyngeal swab specimens and should not be used as a sole basis for treatment. Nasal washings and aspirates are unacceptable for Xpert Xpress SARS-CoV-2/FLU/RSV testing.  Fact Sheet for Patients: EntrepreneurPulse.com.au  Fact Sheet for Healthcare Providers: IncredibleEmployment.be  This test is not yet approved or cleared by the [place] FDA and has been authorized for detection and/or diagnosis of SARS-CoV-2 by FDA under an Emergency Use Authorization (EUA). This EUA will remain in effect (meaning this test can be used) for the duration of the COVID-19 declaration under Section 564(b)(1) of the Act, 21 U.S.C. section 360bbb-3(b)(1), unless the authorization is terminated or revoked.  Performed at [facility], [place] [address], [place], [place] [address]   Blood culture  (routine x 2)     Status: None   Collection Time: 12/14/20  4:30 PM   Specimen: BLOOD  Result Value Ref Range Status   Specimen Description BLOOD RIGHT ANTECUBITAL  Final   Special Requests   Final    BOTTLES DRAWN AEROBIC AND ANAEROBIC Blood Culture adequate volume   Culture   Final    NO GROWTH 5 DAYS Performed at [facility], [place] [address], [place], [place] [address]    Report Status 12/19/2020 FINAL  Final  Blood culture (routine x 2)     Status: None   Collection Time: 12/14/20  4:35 PM   Specimen: BLOOD  Result Value Ref Range Status   Specimen Description BLOOD RIGHT ANTECUBITAL  Final   Special Requests   Final    BOTTLES DRAWN AEROBIC AND ANAEROBIC Blood Culture adequate volume   Culture   Final    NO GROWTH 5 DAYS Performed at [facility], [place] [address] [place], [place] [address]    Report Status 12/19/2020 FINAL  Final     Medications   Scheduled Meds:  acidophilus  1 capsule Oral Daily   Chlorhexidine Gluconate Cloth  6 each Topical Daily   feeding supplement  237 mL Oral BID BM   insulin aspart  0-9 Units Subcutaneous Q4H   lipase/protease/amylase  36,000 Units Oral TID AC   loratadine  10 mg Oral Daily   metoprolol succinate  12.5 mg Oral Daily   midodrine  10 mg Oral TID WC   multivitamin with minerals  1 tablet Oral Daily   pantoprazole  40 mg Oral Daily   sodium chloride flush  3 mL Intravenous Q12H   sucralfate  1 g Oral BID WC   Continuous Infusions:    LOS: 7 days    Time spent: 25 minutes  Domenic Polite, MD [facility]  12/21/2020, 12:45 PM

## 2020-12-21 NOTE — Progress Notes (Signed)
[facility] app in pt room is not working, no available computer on wheels

## 2020-12-22 DIAGNOSIS — G9340 Encephalopathy, unspecified: Secondary | ICD-10-CM | POA: Diagnosis not present

## 2020-12-22 LAB — GLUCOSE, CAPILLARY
Glucose-Capillary: 140 mg/dL — ABNORMAL HIGH (ref 70–99)
Glucose-Capillary: 149 mg/dL — ABNORMAL HIGH (ref 70–99)
Glucose-Capillary: 150 mg/dL — ABNORMAL HIGH (ref 70–99)
Glucose-Capillary: 152 mg/dL — ABNORMAL HIGH (ref 70–99)
Glucose-Capillary: 167 mg/dL — ABNORMAL HIGH (ref 70–99)

## 2020-12-22 MED ORDER — MORPHINE SULFATE (CONCENTRATE) 20 MG/ML PO SOLN: 5.0000 mg | ORAL | 0 refills | Status: AC | PRN

## 2020-12-22 MED ORDER — MIDODRINE HCL 10 MG PO TABS
10.0000 mg | ORAL_TABLET | Freq: Two times a day (BID) | ORAL | 0 refills | Status: AC
Start: 2020-12-22 — End: ?

## 2020-12-22 NOTE — Discharge Instructions (Signed)
All chemotherapy appointments have been cancelled with [place] at [facility]. Dr. Benay Spice has kept the office visit on 01/07/21 at 11:20 in case you still wish to see him in the office. You may [phone] option 2 if you need to cancel or would like to change the visit to telephone or video visit.  Call office with any questions or concerns.

## 2020-12-22 NOTE — Progress Notes (Signed)
[facility]:   Met with pt's daughter and son at bedside. Spoke to the spouse over phone who had gone home already during this meeting. Introduced them to hospice services and the interdisciplinary team approach. They are in agreement with the services and would like to proceed with plan to take pt home with hospice care. The pt has been d/c today. It was originally stated that they did not want hospital bed. After further discussion the pt has changed her mind. I have ordered a hospital bed and wheelchair to be delivered to home STAT by [facility]. Pt to go home by ambulance and the family will call when ambulance arrives to hospital for d/c and we will then send our nurse out this evening to help get them enrolled into services and meet the needs. Webb Silversmith RN [phone]

## 2020-12-22 NOTE — Discharge Summary (Signed)
Physician Discharge Summary  Nancy [place] GYI:[id number] DOB: [birth year] DOA: 12/14/2020  PCP: Lilian Coma., MD  Admit date: 12/14/2020 Discharge date: 12/22/2020  Time spent: 35 minutes  Recommendations for Outpatient Follow-up:  Discharged home with [facility] hospice for comfort focused care  Discharge Diagnoses:  Principal Problem:   Acute encephalopathy Metastatic pancreatic cancer Hypotension Severe hypoalbuminemia with third spacing A. fib RVR Severe protein calorie malnutrition   Primary cancer of head of pancreas ([place])   DMII (diabetes mellitus, type 2) (HCC)   Paroxysmal atrial fibrillation with RVR ([place])   DVT of lower extremity, bilateral (HCC)   Hyponatremia   Thrombocytopenia (HCC)   SIRS (systemic inflammatory response syndrome) ([place])   AKI (acute kidney injury) ([place])   Anasarca   Protein-calorie malnutrition, severe   Discharge Condition: Poor  Diet recommendation: Comfort feeds  Filed Weights   12/19/20 0500 12/20/20 0402 12/22/20 0600  Weight: 76.5 kg 75.5 kg 75.8 kg    History of present illness:  Nancy [place] is a chronically ill 80/F with history of metastatic pancreatic cancer on chemotherapy, atrial fibrillation, bilateral lower extremity DVTs on Lovenox, recent GI bleeding, insulin-dependent diabetes mellitus, chronic hypotension, presented to the ED on 12/14/20 with lethargy, confusion, and anorexia.  Daughter reported progressive generalized weakness and fatigue since getting chemotherapy on 12/10/2020.  Too weak to even ambulate.  Pt seen at the cancer center and directed to the ED for further evaluation.    In the ED, patient afebrile, in A-fib RVR with HR as high as 130s, and BP in the 35K to 09F systolic.   Head CT negative for acute intracranial abnormality.  Chest x-ray with mild progression in right hemidiaphragm elevation with mild right basilar atelectasis .Labs notable for sodium 126, and elevated BUN to creatinine ratio.  UA noted pyuria,  blood cultures were collected in the emergency department, given 2.5 L LR, and started on vancomycin, Azactam, and Flagyl.    Hospital Course:   Hypotension, lactic acidosis - Transient confusion, metabolic encephalopathy -multifactorial, has chronic hypotension, worsened by A. fib, severe hypoalbuminemia and third spacing, dehydration following chemotherapy and cancer progression, suspicion for infection is low -Treated with broad-spectrum antibiotics, blood cultures are negative, influenza and COVID PCR were negative as well, UA noted pyuria, urine culture unfortunately did not get sent -IV fluids discontinued due to profound third spacing, started on midodrine, dose increased -Completed 7 days of aztreonam, discontinued antibiotics yesterday -Has received multiple fluid boluses and IV albumin several times this admission -Random cortisol is 21, rules out adrenal insufficiency -BP is finally more stable, heart rate improving -She will be discharged home with hospice services today   Stage IV pancreatic cancer, liver metastasis- on chemotherapy.  -Status post SBRT and modified FOLFIRINOX. -Had cycle 1 of gemcitabine and Abraxane on 12/10/20. -Oncology following and followed her closely -Overall prognosis is very poor in the setting of her age, frailty, hemodynamic instability, severe hypoalbuminemia with third spacing, progressive pancreatic CA -Ca 19-9 went to 11k from 1.5K 1 month ago, indicating rapid disease progression -Do not think she would be able to tolerate any further chemotherapy, had goals of care discussions per her oncologist Dr. Benay Spice, he also felt she was too frail for further treatment, he recommended home hospice and made referral to [facility] hospice yesterday -She will be discharged home with hospice services   A. fib with RVR - -Treated with low-dose Toprol, digoxin on a few occasions and midodrine --No anticoagulation in the setting of severe thrombocytopenia -  Now  comfort care   Hyponatremia -  -Improving with hydration, has a component of chronic hyponatremia too   Hypomagnesemia - replaced    Anemia / Thrombocytopenia - likely due to chemotherapy, cancer -Improving, counts recovering following chemo   Anasarca / Hypoalbuminemia - Albumin <1.5  Given IV albumin multiple times this admission including overnight  -Poor prognosis   Insulin-dependent DM - insulin coverage and CBG's. Adjust insulin for inpatient goal 140-180   Gastritis - seen on EGD 12/04/2020.   --Continue PPI and Carafate    Severe protein calorie malnutrition  -in the background of very poor oral intake and progressive pancreatic cancer -Prognosis remains very poor  Discharge Exam: Vitals:   12/22/20 0742 12/22/20 0806  BP:  102/68  Pulse: 65   Resp: 13   Temp:    SpO2: 98%     Chronically ill elderly female laying in bed, awake alert oriented to self and place HEENT: Positive JVD CVS: S1-S2, regular rate rhythm Lungs: Decreased breath sounds to bases Abdomen: Soft, nontender, mildly distended, profound subcutaneous edema in posterior abdomen and flanks  Extremities: 2+ edema with third spacing in upper and lower extremities Skin: no new rashes on exposed skin    Discharge Instructions   Discharge Instructions     Increase activity slowly   Complete by: As directed       Allergies as of 12/22/2020       Reactions   Cephalosporins Shortness Of Breath   Erythromycin Shortness Of Breath   Fire Ant Shortness Of Breath   Penicillins Anaphylaxis, Other (See Comments)   "25 years ago"        Medication List     STOP taking these medications    Accu-Chek Guide test strip Generic drug: glucose blood   Basaglar KwikPen 100 UNIT/ML   enoxaparin 40 MG/0.4ML injection Commonly known as: LOVENOX   insulin aspart 100 UNIT/ML FlexPen Commonly known as: NOVOLOG   OCUVITE EYE HEALTH FORMULA PO   Probiotic Daily Caps   sucralfate 1 GM/10ML  suspension Commonly known as: CARAFATE   telmisartan 40 MG tablet Commonly known as: MICARDIS   Vitamin B-12 2500 MCG Subl   Vitamin D3 25 MCG (1000 UT) Caps       TAKE these medications    Ensure Max Protein Liqd Take 330 mLs (11 oz total) by mouth 2 (two) times daily with a meal. What changed:  when to take this reasons to take this   fexofenadine 180 MG tablet Commonly known as: ALLEGRA Take 180 mg by mouth in the morning.   guaiFENesin 600 MG 12 hr tablet Commonly known as: MUCINEX Take 600 mg by mouth daily.   lidocaine-prilocaine cream Commonly known as: EMLA Apply 1 application topically as needed. Apply over port 45 minutes prior to having port accessed for chemotherapy treatment.   lipase/protease/amylase 36000 UNITS Cpep capsule Commonly known as: Creon Take 1 capsule (36,000 Units total) by mouth 3 (three) times daily before meals. TAKE 1 CAPSULE (36,000 UNITS TOTAL) BY MOUTH 3 (THREE) TIMES DAILY BEFORE MEALS.   midodrine 10 MG tablet Commonly known as: PROAMATINE Take 1 tablet (10 mg total) by mouth 2 (two) times daily with a meal.   morphine 20 MG/ML concentrated solution Commonly known as: ROXANOL Take 0.25 mLs (5 mg total) by mouth every 4 (four) hours as needed for severe pain or moderate pain (air hunger).   pantoprazole 40 MG tablet Commonly known as: PROTONIX Take 1 tablet (40 mg total)  by mouth daily.   traMADol 50 MG tablet Commonly known as: ULTRAM Take 1 tablet (50 mg total) by mouth every 6 (six) hours as needed for moderate pain.   triamcinolone 55 MCG/ACT Aero nasal inhaler Commonly known as: NASACORT Place 2 sprays into the nose daily as needed (for congestion).       Allergies  Allergen Reactions   Cephalosporins Shortness Of Breath   Erythromycin Shortness Of Breath   Fire Ant Shortness Of Breath   Penicillins Anaphylaxis and Other (See Comments)    "25 years ago"      The results of significant diagnostics from this  hospitalization (including imaging, microbiology, ancillary and laboratory) are listed below for reference.    Significant Diagnostic Studies: DG Chest 2 View  Result Date: 12/14/2020 CLINICAL DATA:  Weakness. Slurred speech. Lethargy. Currently undergoing chemotherapy for pancreatic cancer. EXAM: CHEST - 2 VIEW COMPARISON:  03/11/2020 FINDINGS: Normal sized heart. Mildly progressive elevation of the right hemidiaphragm with mild right basilar atelectasis and possible small right pleural effusion. Stable right subclavian porta catheter with its tip in the proximal superior vena cava. Diffuse osteopenia. Upper abdominal surgical clips and biliary stent. IMPRESSION: Mildly progressive elevation of the right hemidiaphragm with mild right basilar atelectasis and possible small right pleural effusion. Electronically Signed   By: Claudie Revering M.D.   On: 12/14/2020 15:59   CT Head Wo Contrast  Result Date: 12/14/2020 CLINICAL DATA:  Neuro deficit, acute, stroke suspected EXAM: CT HEAD WITHOUT CONTRAST TECHNIQUE: Contiguous axial images were obtained from the base of the skull through the vertex without intravenous contrast. COMPARISON:  CT 03/05/2020. FINDINGS: Brain: No evidence of acute intracranial hemorrhage or extra-axial collection.No evidence of mass lesion/concern mass effect.The ventricles are normal in size.Scattered subcortical and periventricular white matter hypodensities, nonspecific but likely sequela of chronic small vessel ischemic disease. Vascular: No hyperdense vessel or unexpected calcification. Skull: Normal. Negative for fracture or focal lesion. Sinuses/Orbits: No acute finding. Other: None. IMPRESSION: No acute intracranial abnormality. Mild chronic small vessel ischemic changes. Electronically Signed   By: Maurine Simmering M.D.   On: 12/14/2020 16:11   MR BRAIN WO CONTRAST  Result Date: 12/15/2020 CLINICAL DATA:  Stroke suspected EXAM: MRI HEAD WITHOUT CONTRAST TECHNIQUE: Multiplanar,  multiecho pulse sequences of the brain and surrounding structures were obtained without intravenous contrast. COMPARISON:  CT head 12/14/2020, no prior MRI FINDINGS: Brain: No restricted diffusion to suggest acute infarction. No acute hemorrhage, mass, mass effect, or midline shift. No hydrocephalus or extra-axial collection. Ventricles and sulci are within normal limits for age. Vascular: Normal flow voids. Skull and upper cervical spine: Normal marrow signal. Sinuses/Orbits: Postoperative changes in the sinuses, with mucosal thickening in the ethmoid air cells. Status post bilateral lens replacements. Other: Trace fluid in bilateral mastoid air cells. IMPRESSION: No acute intracranial process. Electronically Signed   By: Merilyn Baba M.D.   On: 12/15/2020 19:59   CT ABDOMEN PELVIS W CONTRAST  Result Date: 12/03/2020 CLINICAL DATA:  Anemia, central abdominal pain. Known pancreatic cancer. EXAM: CT ABDOMEN AND PELVIS WITH CONTRAST TECHNIQUE: Multidetector CT imaging of the abdomen and pelvis was performed using the standard protocol following bolus administration of intravenous contrast. CONTRAST:  55mL OMNIPAQUE IOHEXOL 350 MG/ML SOLN COMPARISON:  06/15/2020 FINDINGS: Lower chest: Lung bases are clear. No effusions. Heart is normal size. Hepatobiliary: Numerous low-density masses with rim enhancement noted throughout the liver compatible with metastases. The largest is in the right hepatic dome measuring 3.9 cm. Biliary stent noted in  the common bile duct with pneumobilia. Diffuse low-density throughout the liver compatible with fatty infiltration. Prior cholecystectomy. Pancreas: In the area of the prior mass in the pancreatic head is a low-density cystic area measuring 1.7 cm. This may reflect necrosis of the previously cyst in pancreatic head mass. Mild biliary ductal dilatation. Spleen: No focal abnormality.  Normal size. Adrenals/Urinary Tract: No hydronephrosis. No renal or adrenal mass. Urinary bladder  unremarkable. Stomach/Bowel: Left colonic diverticulosis. No active diverticulitis. Stomach and small bowel decompressed, unremarkable. Vascular/Lymphatic: Aortic atherosclerosis. No evidence of aneurysm or adenopathy. Reproductive: Prior hysterectomy.  No adnexal masses. Other: Small amount of free fluid in the abdomen and pelvis. No free air. Musculoskeletal: No acute bony abnormality. Stable mild chronic compression fractures in the lower thoracic spine and upper lumbar spine. IMPRESSION: Numerous rim enhancing lesions throughout the liver measuring up to 4 cm compatible with metastases. These are new since prior study. Hepatic steatosis. Cystic appearance of the mass in the pancreatic head which could be related to necrosis. Biliary stent in place with pneumobilia. Small amount of free fluid in the abdomen or pelvis. Left colonic diverticulosis.  No active diverticulitis. Electronically Signed   By: Rolm Baptise M.D.   On: 12/03/2020 18:27   VAS [place] LOWER EXTREMITY VENOUS (DVT)  Result Date: 12/02/2020  Lower Venous DVT Study Patient Name:  AMEILA WELDON  Date of Exam:   12/01/2020 Medical Rec #: [id number]    Accession #:    [id number] Date of Birth: 1940/09/30     Patient Gender: F Patient Age:   64 years Exam Location:  [facility] Procedure:      VAS [place] LOWER EXTREMITY VENOUS (DVT) Referring Phys: Ned Card --------------------------------------------------------------------------------  Indications: Bilateral leg swelling and SOB x 3 weeks. Patient takes aspirin daily.  Risk Factors: Cancer Pancreatic. Comparison Study: None Performing Technologist: Alecia Mackin RVT, RDCS (AE), RDMS  Examination Guidelines: A complete evaluation includes B-mode imaging, spectral Doppler, color Doppler, and power Doppler as needed of all accessible portions of each vessel. Bilateral testing is considered an integral part of a complete examination. Limited examinations for reoccurring indications may be performed as noted.  The reflux portion of the exam is performed with the patient in reverse Trendelenburg.  +---------+---------------+---------+-----------+---------------+--------------+ RIGHT    CompressibilityPhasicitySpontaneityProperties     Thrombus Aging +---------+---------------+---------+-----------+---------------+--------------+ CFV      Full           Yes      Yes                                      +---------+---------------+---------+-----------+---------------+--------------+ SFJ      Full           Yes      Yes                                      +---------+---------------+---------+-----------+---------------+--------------+ FV Prox  Full           Yes      Yes                                      +---------+---------------+---------+-----------+---------------+--------------+ FV Mid   Full           Yes  Yes                                      +---------+---------------+---------+-----------+---------------+--------------+ FV DistalPartial        Yes      Yes        seen in        Age                                                        duplicating    Indeterminate                                              vein only                     +---------+---------------+---------+-----------+---------------+--------------+ PFV      Full                                                             +---------+---------------+---------+-----------+---------------+--------------+ POP      Partial        Yes      No         with striationsAge                                                                       Indeterminate  +---------+---------------+---------+-----------+---------------+--------------+ PTV      None           No       No         rigid          Age                                                        w/compression  Indeterminate   +---------+---------------+---------+-----------+---------------+--------------+ PERO     None           No       No         rigid          Age                                                        w/compression  Indeterminate  +---------+---------------+---------+-----------+---------------+--------------+ Gastroc  Full                                                             +---------+---------------+---------+-----------+---------------+--------------+  GSV      Full           Yes      Yes                                      +---------+---------------+---------+-----------+---------------+--------------+   +---------+---------------+---------+-----------+---------------+--------------+ LEFT     CompressibilityPhasicitySpontaneityProperties     Thrombus Aging +---------+---------------+---------+-----------+---------------+--------------+ CFV      Full           Yes      Yes                                      +---------+---------------+---------+-----------+---------------+--------------+ SFJ      Full           Yes      Yes                                      +---------+---------------+---------+-----------+---------------+--------------+ FV Prox  Full           Yes      Yes                                      +---------+---------------+---------+-----------+---------------+--------------+ FV Mid   Partial        Yes      No         with striationsAge                                                                       Indeterminate  +---------+---------------+---------+-----------+---------------+--------------+ FV DistalPartial        Yes      No         with striationsAge                                                                       Indeterminate  +---------+---------------+---------+-----------+---------------+--------------+ PFV      Full                                                              +---------+---------------+---------+-----------+---------------+--------------+ POP      Partial        Yes      No         with striationsAge  Indeterminate  +---------+---------------+---------+-----------+---------------+--------------+ PTV      None           No       No         rigid          Age                                                        w/compression  Indeterminate  +---------+---------------+---------+-----------+---------------+--------------+ PERO     None           No       No         rigid          Age                                                        w/compression  Indeterminate  +---------+---------------+---------+-----------+---------------+--------------+ Gastroc  Full                                                             +---------+---------------+---------+-----------+---------------+--------------+ GSV      Full           Yes      Yes                                      +---------+---------------+---------+-----------+---------------+--------------+    Findings reported to [facility] in [place] 4:05 pm.  Summary: RIGHT: - Findings consistent with age indeterminate deep vein thrombosis involving the right femoral vein, right popliteal vein, right peroneal veins, and right posterior tibial veins.  LEFT: - Findings consistent with age indeterminate deep vein thrombosis involving the left femoral vein, left popliteal vein, left posterior tibial veins, and left peroneal veins.  *See table(s) above for measurements and observations. Electronically signed by Kathlyn Sacramento MD on 12/02/2020 at 9:28:36 AM.    Final     Microbiology: Recent Results (from the past 240 hour(s))  Resp Panel by RT-PCR (Flu A&B, Covid) Nasopharyngeal Swab     Status: None   Collection Time: 12/14/20  3:44 PM   Specimen: Nasopharyngeal Swab; Nasopharyngeal(NP) swabs in  vial transport medium  Result Value Ref Range Status   SARS Coronavirus 2 by RT PCR NEGATIVE NEGATIVE Final    Comment: (NOTE) SARS-CoV-2 target nucleic acids are NOT DETECTED.  The SARS-CoV-2 RNA is generally detectable in upper respiratory specimens during the acute phase of infection. The lowest concentration of SARS-CoV-2 viral copies this assay can detect is 138 copies/mL. A negative result does not preclude SARS-Cov-2 infection and should not be used as the sole basis for treatment or other patient management decisions. A negative result may occur with  improper specimen collection/handling, submission of specimen other than nasopharyngeal swab, presence of viral mutation(s) within the areas targeted by this assay, and inadequate number of viral copies(<138 copies/mL). A negative result must be combined with  clinical observations, patient history, and epidemiological information. The expected result is Negative.  Fact Sheet for Patients:  EntrepreneurPulse.com.au  Fact Sheet for Healthcare Providers:  IncredibleEmployment.be  This test is no t yet approved or cleared by the [place] FDA and  has been authorized for detection and/or diagnosis of SARS-CoV-2 by FDA under an Emergency Use Authorization (EUA). This EUA will remain  in effect (meaning this test can be used) for the duration of the COVID-19 declaration under Section 564(b)(1) of the Act, 21 U.S.C.section 360bbb-3(b)(1), unless the authorization is terminated  or revoked sooner.       Influenza A by PCR NEGATIVE NEGATIVE Final   Influenza B by PCR NEGATIVE NEGATIVE Final    Comment: (NOTE) The Xpert Xpress SARS-CoV-2/FLU/RSV plus assay is intended as an aid in the diagnosis of influenza from Nasopharyngeal swab specimens and should not be used as a sole basis for treatment. Nasal washings and aspirates are unacceptable for Xpert Xpress SARS-CoV-2/FLU/RSV testing.  Fact  Sheet for Patients: EntrepreneurPulse.com.au  Fact Sheet for Healthcare Providers: IncredibleEmployment.be  This test is not yet approved or cleared by the [place] FDA and has been authorized for detection and/or diagnosis of SARS-CoV-2 by FDA under an Emergency Use Authorization (EUA). This EUA will remain in effect (meaning this test can be used) for the duration of the COVID-19 declaration under Section 564(b)(1) of the Act, 21 U.S.C. section 360bbb-3(b)(1), unless the authorization is terminated or revoked.  Performed at [facility], [place] [address], [place], [place] [address]   Blood culture (routine x 2)     Status: None   Collection Time: 12/14/20  4:30 PM   Specimen: BLOOD  Result Value Ref Range Status   Specimen Description BLOOD RIGHT ANTECUBITAL  Final   Special Requests   Final    BOTTLES DRAWN AEROBIC AND ANAEROBIC Blood Culture adequate volume   Culture   Final    NO GROWTH 5 DAYS Performed at [facility], [place] [address], [place], [place] [address]    Report Status 12/19/2020 FINAL  Final  Blood culture (routine x 2)     Status: None   Collection Time: 12/14/20  4:35 PM   Specimen: BLOOD  Result Value Ref Range Status   Specimen Description BLOOD RIGHT ANTECUBITAL  Final   Special Requests   Final    BOTTLES DRAWN AEROBIC AND ANAEROBIC Blood Culture adequate volume   Culture   Final    NO GROWTH 5 DAYS Performed at [facility], [place] [address] [place], [place] [address]    Report Status 12/19/2020 FINAL  Final     Labs: Basic Metabolic Panel: Recent Labs  Lab 12/16/20 0256 12/17/20 0333 12/18/20 0227 12/19/20 0254 12/20/20 0303 12/21/20 0358  NA 129* 131* 134* 131* 132* 130*  K 3.2* 3.5 3.8 3.8 3.6 3.8  CL 102 106 107 106 105 104  CO2 20* 20* 20* 19* 21* 21*  GLUCOSE 123* 131* 143* 167* 176* 145*  BUN 21 20 21 22  24* 20  CREATININE 0.49 0.44 0.48 0.51 0.53 0.49  CALCIUM 7.0* 7.1*  7.1* 7.1* 7.3* 7.3*  MG 1.8  --   --   --   --   --    Liver Function Tests: Recent Labs  Lab 12/16/20 0256 12/17/20 0333 12/19/20 0254  AST 55* 55* 29  ALT 31 38 28  ALKPHOS 186* 245* 270*  BILITOT 2.9* 2.0* 1.6*  PROT 3.9* 4.0* 3.8*  ALBUMIN  1.6* 1.7* <1.5*   No results for input(s): LIPASE, AMYLASE in the last 168 hours. No results for input(s): AMMONIA in the last 168 hours. CBC: Recent Labs  Lab 12/16/20 0256 12/17/20 0333 12/18/20 1518 12/19/20 0254 12/20/20 0303 12/21/20 0358  WBC 1.8* 1.4*  1.6* 4.3 5.4 10.7* 11.5*  NEUTROABS 1.3* 0.6* 2.1 2.7  --   --   HGB 7.8* 8.4*  8.5* 10.0* 10.0* 9.8* 10.2*  HCT 22.5* 24.4*  25.3* 30.4* 29.3* 29.8* 31.4*  MCV 82.7 82.4  84.3 84.9 84.4 85.6 86.7  PLT 26* 20*  22* 36* 41* 53* 60*   Cardiac Enzymes: No results for input(s): CKTOTAL, CKMB, CKMBINDEX, TROPONINI in the last 168 hours. BNP: BNP (last 3 results) Recent Labs    12/14/20 1635  BNP 257.9*    ProBNP (last 3 results) No results for input(s): PROBNP in the last 8760 hours.  CBG: Recent Labs  Lab 12/22/20 0013 12/22/20 0455 12/22/20 0730 12/22/20 0805 12/22/20 1151  GLUCAP 149* 140* 152* 150* 167*       Signed:  Domenic Polite MD.  [facility] 12/22/2020, 12:39 PM

## 2020-12-22 NOTE — TOC Transition Note (Signed)
Transition of Care [facility]) - CM/SW Discharge Note   Patient Details  Name: Nancy Hawkins MRN: [id number] Date of Birth: 1940-09-26  Transition of Care [facility]) CM/SW Contact:  Sharin Mons, RN Phone Number: 12/22/2020, 12:14 PM   Clinical Narrative:     Patient will DC to: HOME Anticipated DC date: 12/22/2020 Family notified: YES Transport by: Corey Harold  NCM received consult for home hospice care. Spoke with pt regarding next level of care, home with hospice care. Pt agreeable. Choice provided. [facility] selected. Referral made with [place] and accepted after case was reviewed. Per MD patient ready for DC today. RN, patient, and patient's family aware of DC plan. Ambulance transport requested for patient with [facility]. Transportation papers on front of chart.   RNCM will sign off for now as intervention is no longer needed. Please consult [place] again if new needs arise.    Final next level of care: Home w Hospice Care Barriers to Discharge: No Barriers Identified   Patient Goals and CMS Choice     Choice offered to / list presented to : Patient, Spouse  Discharge Placement                       Discharge Plan and Services                                     Social Determinants of Health (SDOH) Interventions     Readmission Risk Interventions No flowsheet data found.

## 2020-12-23 ENCOUNTER — Telehealth: Payer: Self-pay | Admitting: *Deleted

## 2020-12-23 NOTE — Telephone Encounter (Signed)
Nancy Hawkins was contacted by telephone to verify understanding of discharge instructions status post their most recent discharge from the hospital on the date:  12/22/20.  Inpatient discharge AVS was re-reviewed with daughter along with cancer center appointments.  She requested to have all [facility] appointments cancelled since the Hospice physician and managing her care via [facility] already been in home and all needed equipment has been ordered and in place.

## 2020-12-24 ENCOUNTER — Inpatient Hospital Stay: Payer: Medicare Other

## 2020-12-24 ENCOUNTER — Inpatient Hospital Stay: Payer: Medicare Other | Admitting: Nurse Practitioner

## 2021-01-07 ENCOUNTER — Inpatient Hospital Stay: Payer: Medicare Other

## 2021-01-07 ENCOUNTER — Inpatient Hospital Stay: Payer: Medicare Other | Admitting: Oncology

## 2021-01-07 DEATH — deceased

## 2021-01-14 ENCOUNTER — Encounter: Payer: Self-pay | Admitting: Oncology
